# Patient Record
Sex: Female | Born: 1975
Health system: Southern US, Community
[De-identification: ages and names within clinical notes are randomized; demographics above are authoritative.]

## PROBLEM LIST (undated history)

## (undated) ENCOUNTER — Inpatient Hospital Stay: Payer: Self-pay

## (undated) DIAGNOSIS — N200 Calculus of kidney: Secondary | ICD-10-CM

## (undated) DIAGNOSIS — M722 Plantar fascial fibromatosis: Secondary | ICD-10-CM

## (undated) DIAGNOSIS — E669 Obesity, unspecified: Secondary | ICD-10-CM

## (undated) DIAGNOSIS — G43909 Migraine, unspecified, not intractable, without status migrainosus: Secondary | ICD-10-CM

## (undated) DIAGNOSIS — F419 Anxiety disorder, unspecified: Secondary | ICD-10-CM

## (undated) DIAGNOSIS — K219 Gastro-esophageal reflux disease without esophagitis: Secondary | ICD-10-CM

## (undated) DIAGNOSIS — O24419 Gestational diabetes mellitus in pregnancy, unspecified control: Secondary | ICD-10-CM

## (undated) DIAGNOSIS — M5126 Other intervertebral disc displacement, lumbar region: Secondary | ICD-10-CM

## (undated) DIAGNOSIS — M549 Dorsalgia, unspecified: Secondary | ICD-10-CM

## (undated) DIAGNOSIS — E041 Nontoxic single thyroid nodule: Secondary | ICD-10-CM

## (undated) DIAGNOSIS — K859 Acute pancreatitis without necrosis or infection, unspecified: Secondary | ICD-10-CM

## (undated) DIAGNOSIS — M329 Systemic lupus erythematosus, unspecified: Secondary | ICD-10-CM

## (undated) DIAGNOSIS — IMO0002 Reserved for concepts with insufficient information to code with codable children: Secondary | ICD-10-CM

## (undated) HISTORY — DX: Reserved for concepts with insufficient information to code with codable children: IMO0002

## (undated) HISTORY — DX: Plantar fascial fibromatosis: M72.2

## (undated) HISTORY — DX: Anxiety disorder, unspecified: F41.9

## (undated) HISTORY — DX: Calculus of kidney: N20.0

## (undated) HISTORY — DX: Morbid (severe) obesity due to excess calories: E66.01

## (undated) HISTORY — PX: THYROID CYST EXCISION: SHX2511

## (undated) HISTORY — DX: Gastro-esophageal reflux disease without esophagitis: K21.9

## (undated) HISTORY — PX: CHOLECYSTECTOMY: SHX55

## (undated) HISTORY — PX: LAPAROSCOPY: SHX197

## (undated) HISTORY — DX: Acute pancreatitis without necrosis or infection, unspecified: K85.90

## (undated) HISTORY — PX: APPENDECTOMY: SHX54

## (undated) HISTORY — DX: Obesity, unspecified: E66.9

## (undated) HISTORY — DX: Dorsalgia, unspecified: M54.9

## (undated) HISTORY — DX: Other intervertebral disc displacement, lumbar region: M51.26

## (undated) HISTORY — DX: Systemic lupus erythematosus, unspecified: M32.9

## (undated) HISTORY — DX: Nontoxic single thyroid nodule: E04.1

---

## 1997-08-21 ENCOUNTER — Inpatient Hospital Stay (HOSPITAL_COMMUNITY): Admission: EM | Admit: 1997-08-21 | Discharge: 1997-08-22 | Payer: Self-pay | Admitting: Emergency Medicine

## 1997-08-23 ENCOUNTER — Encounter (HOSPITAL_COMMUNITY): Admission: RE | Admit: 1997-08-23 | Discharge: 1997-11-21 | Payer: Self-pay

## 1999-08-12 ENCOUNTER — Emergency Department (HOSPITAL_COMMUNITY): Admission: EM | Admit: 1999-08-12 | Discharge: 1999-08-12 | Payer: Self-pay | Admitting: Emergency Medicine

## 2004-04-13 DIAGNOSIS — O1490 Unspecified pre-eclampsia, unspecified trimester: Secondary | ICD-10-CM

## 2004-05-13 ENCOUNTER — Other Ambulatory Visit: Admission: RE | Admit: 2004-05-13 | Discharge: 2004-05-13 | Payer: Self-pay | Admitting: Obstetrics & Gynecology

## 2004-11-03 ENCOUNTER — Emergency Department (HOSPITAL_COMMUNITY): Admission: EM | Admit: 2004-11-03 | Discharge: 2004-11-03 | Payer: Self-pay | Admitting: Emergency Medicine

## 2004-11-28 ENCOUNTER — Ambulatory Visit (HOSPITAL_COMMUNITY): Admission: RE | Admit: 2004-11-28 | Discharge: 2004-11-28 | Payer: Self-pay | Admitting: Obstetrics & Gynecology

## 2005-01-11 ENCOUNTER — Inpatient Hospital Stay (HOSPITAL_COMMUNITY): Admission: AD | Admit: 2005-01-11 | Discharge: 2005-01-11 | Payer: Self-pay | Admitting: Obstetrics and Gynecology

## 2005-01-16 ENCOUNTER — Inpatient Hospital Stay (HOSPITAL_COMMUNITY): Admission: AD | Admit: 2005-01-16 | Discharge: 2005-01-17 | Payer: Self-pay | Admitting: Obstetrics and Gynecology

## 2005-02-24 ENCOUNTER — Inpatient Hospital Stay (HOSPITAL_COMMUNITY): Admission: AD | Admit: 2005-02-24 | Discharge: 2005-02-25 | Payer: Self-pay | Admitting: Obstetrics & Gynecology

## 2005-04-23 ENCOUNTER — Inpatient Hospital Stay (HOSPITAL_COMMUNITY): Admission: AD | Admit: 2005-04-23 | Discharge: 2005-04-23 | Payer: Self-pay | Admitting: Obstetrics & Gynecology

## 2005-06-14 ENCOUNTER — Inpatient Hospital Stay (HOSPITAL_COMMUNITY): Admission: AD | Admit: 2005-06-14 | Discharge: 2005-06-14 | Payer: Self-pay | Admitting: Obstetrics and Gynecology

## 2005-07-15 ENCOUNTER — Inpatient Hospital Stay (HOSPITAL_COMMUNITY): Admission: AD | Admit: 2005-07-15 | Discharge: 2005-07-15 | Payer: Self-pay | Admitting: Obstetrics & Gynecology

## 2005-07-31 ENCOUNTER — Inpatient Hospital Stay (HOSPITAL_COMMUNITY): Admission: AD | Admit: 2005-07-31 | Discharge: 2005-07-31 | Payer: Self-pay | Admitting: Obstetrics & Gynecology

## 2005-08-11 ENCOUNTER — Inpatient Hospital Stay (HOSPITAL_COMMUNITY): Admission: AD | Admit: 2005-08-11 | Discharge: 2005-08-14 | Payer: Self-pay | Admitting: Obstetrics and Gynecology

## 2005-08-15 ENCOUNTER — Encounter: Admission: RE | Admit: 2005-08-15 | Discharge: 2005-09-14 | Payer: Self-pay | Admitting: Obstetrics and Gynecology

## 2005-08-21 ENCOUNTER — Inpatient Hospital Stay (HOSPITAL_COMMUNITY): Admission: AD | Admit: 2005-08-21 | Discharge: 2005-08-23 | Payer: Self-pay | Admitting: Obstetrics and Gynecology

## 2005-09-15 ENCOUNTER — Encounter: Admission: RE | Admit: 2005-09-15 | Discharge: 2005-10-14 | Payer: Self-pay | Admitting: Obstetrics and Gynecology

## 2005-10-15 ENCOUNTER — Encounter: Admission: RE | Admit: 2005-10-15 | Discharge: 2005-11-14 | Payer: Self-pay | Admitting: Obstetrics and Gynecology

## 2005-11-15 ENCOUNTER — Encounter: Admission: RE | Admit: 2005-11-15 | Discharge: 2005-12-15 | Payer: Self-pay | Admitting: Obstetrics and Gynecology

## 2005-12-16 ENCOUNTER — Encounter: Admission: RE | Admit: 2005-12-16 | Discharge: 2006-01-14 | Payer: Self-pay | Admitting: Obstetrics and Gynecology

## 2006-01-15 ENCOUNTER — Encounter: Admission: RE | Admit: 2006-01-15 | Discharge: 2006-02-14 | Payer: Self-pay | Admitting: Obstetrics and Gynecology

## 2006-02-10 ENCOUNTER — Encounter: Admission: RE | Admit: 2006-02-10 | Discharge: 2006-02-10 | Payer: Self-pay | Admitting: Rheumatology

## 2006-02-15 ENCOUNTER — Encounter: Admission: RE | Admit: 2006-02-15 | Discharge: 2006-03-16 | Payer: Self-pay | Admitting: Obstetrics and Gynecology

## 2006-03-17 ENCOUNTER — Encounter: Admission: RE | Admit: 2006-03-17 | Discharge: 2006-04-16 | Payer: Self-pay | Admitting: Obstetrics and Gynecology

## 2006-03-24 LAB — CONVERTED CEMR LAB: Pap Smear: NORMAL

## 2006-04-17 ENCOUNTER — Encounter: Admission: RE | Admit: 2006-04-17 | Discharge: 2006-05-17 | Payer: Self-pay | Admitting: Obstetrics and Gynecology

## 2006-05-18 ENCOUNTER — Encounter: Admission: RE | Admit: 2006-05-18 | Discharge: 2006-06-15 | Payer: Self-pay | Admitting: Obstetrics and Gynecology

## 2006-06-10 ENCOUNTER — Emergency Department: Payer: Self-pay | Admitting: Emergency Medicine

## 2006-06-16 ENCOUNTER — Encounter: Admission: RE | Admit: 2006-06-16 | Discharge: 2006-07-16 | Payer: Self-pay | Admitting: Obstetrics and Gynecology

## 2007-04-14 DIAGNOSIS — B343 Parvovirus infection, unspecified: Secondary | ICD-10-CM

## 2007-04-14 DIAGNOSIS — O98511 Other viral diseases complicating pregnancy, first trimester: Secondary | ICD-10-CM

## 2007-04-17 ENCOUNTER — Inpatient Hospital Stay (HOSPITAL_COMMUNITY): Admission: AD | Admit: 2007-04-17 | Discharge: 2007-04-17 | Payer: Self-pay | Admitting: Obstetrics & Gynecology

## 2007-04-23 ENCOUNTER — Inpatient Hospital Stay (HOSPITAL_COMMUNITY): Admission: AD | Admit: 2007-04-23 | Discharge: 2007-04-24 | Payer: Self-pay | Admitting: Obstetrics and Gynecology

## 2007-04-23 ENCOUNTER — Encounter: Payer: Self-pay | Admitting: Family Medicine

## 2007-04-29 ENCOUNTER — Telehealth (INDEPENDENT_AMBULATORY_CARE_PROVIDER_SITE_OTHER): Payer: Self-pay | Admitting: *Deleted

## 2007-04-30 ENCOUNTER — Emergency Department (HOSPITAL_COMMUNITY): Admission: EM | Admit: 2007-04-30 | Discharge: 2007-04-30 | Payer: Self-pay | Admitting: Emergency Medicine

## 2007-05-24 ENCOUNTER — Emergency Department (HOSPITAL_COMMUNITY): Admission: EM | Admit: 2007-05-24 | Discharge: 2007-05-24 | Payer: Self-pay | Admitting: Emergency Medicine

## 2007-05-30 ENCOUNTER — Ambulatory Visit: Payer: Self-pay | Admitting: Family Medicine

## 2007-05-30 ENCOUNTER — Encounter (INDEPENDENT_AMBULATORY_CARE_PROVIDER_SITE_OTHER): Payer: Self-pay | Admitting: *Deleted

## 2007-05-30 DIAGNOSIS — R143 Flatulence: Secondary | ICD-10-CM

## 2007-05-30 DIAGNOSIS — E041 Nontoxic single thyroid nodule: Secondary | ICD-10-CM | POA: Insufficient documentation

## 2007-05-30 DIAGNOSIS — M329 Systemic lupus erythematosus, unspecified: Secondary | ICD-10-CM | POA: Insufficient documentation

## 2007-05-30 DIAGNOSIS — K219 Gastro-esophageal reflux disease without esophagitis: Secondary | ICD-10-CM | POA: Insufficient documentation

## 2007-05-30 DIAGNOSIS — M5126 Other intervertebral disc displacement, lumbar region: Secondary | ICD-10-CM | POA: Insufficient documentation

## 2007-05-30 DIAGNOSIS — L930 Discoid lupus erythematosus: Secondary | ICD-10-CM

## 2007-05-30 DIAGNOSIS — Z9189 Other specified personal risk factors, not elsewhere classified: Secondary | ICD-10-CM | POA: Insufficient documentation

## 2007-05-30 DIAGNOSIS — R141 Gas pain: Secondary | ICD-10-CM | POA: Insufficient documentation

## 2007-05-30 DIAGNOSIS — M549 Dorsalgia, unspecified: Secondary | ICD-10-CM | POA: Insufficient documentation

## 2007-05-30 DIAGNOSIS — N808 Other endometriosis: Secondary | ICD-10-CM | POA: Insufficient documentation

## 2007-05-30 DIAGNOSIS — R142 Eructation: Secondary | ICD-10-CM | POA: Insufficient documentation

## 2007-05-30 DIAGNOSIS — M722 Plantar fascial fibromatosis: Secondary | ICD-10-CM | POA: Insufficient documentation

## 2007-05-30 DIAGNOSIS — R109 Unspecified abdominal pain: Secondary | ICD-10-CM | POA: Insufficient documentation

## 2007-06-05 LAB — CONVERTED CEMR LAB: Anti Nuclear Antibody(ANA): NEGATIVE

## 2007-06-06 ENCOUNTER — Telehealth: Payer: Self-pay | Admitting: Family Medicine

## 2007-06-06 ENCOUNTER — Telehealth (INDEPENDENT_AMBULATORY_CARE_PROVIDER_SITE_OTHER): Payer: Self-pay | Admitting: *Deleted

## 2007-06-07 LAB — CONVERTED CEMR LAB
ALT: 59 U/L — ABNORMAL HIGH
AST: 49 U/L — ABNORMAL HIGH
Albumin: 4.1 g/dL
Alkaline Phosphatase: 90 U/L
BUN: 8 mg/dL
Basophils Absolute: 0.1 10*3/uL
Basophils Relative: 0.9 %
Bilirubin, Direct: 0.3 mg/dL
CO2: 26 meq/L
Calcium: 9.7 mg/dL
Chloride: 101 meq/L
Cholesterol: 235 mg/dL
Creatinine, Ser: 0.7 mg/dL
Direct LDL: 132.4 mg/dL
Eosinophils Absolute: 0.2 10*3/uL
Eosinophils Relative: 1.9 %
Folate: 7.3 ng/mL
GFR calc Af Amer: 127 mL/min
GFR calc non Af Amer: 105 mL/min
Glucose, Bld: 82 mg/dL
HCT: 39.5 %
HDL: 38.6 mg/dL — ABNORMAL LOW
Hemoglobin: 13.2 g/dL
Lymphocytes Relative: 22.5 %
MCHC: 33.4 g/dL
MCV: 85.5 fL
Monocytes Absolute: 0.5 10*3/uL
Monocytes Relative: 4.6 %
Neutro Abs: 7 10*3/uL
Neutrophils Relative %: 70.1 %
Platelets: 152 10*3/uL
Potassium: 4.6 meq/L
RBC: 4.62 M/uL
RDW: 12.5 %
Rheumatoid fact SerPl-aCnc: <20 [IU]/mL — ABNORMAL LOW
Sed Rate: 24 mm/h
Sodium: 139 meq/L
TSH: 2.27 u[IU]/mL
Total Bilirubin: 0.9 mg/dL
Total CHOL/HDL Ratio: 6.1
Total Protein: 7.3 g/dL
Triglycerides: 308 mg/dL
VLDL: 62 mg/dL — ABNORMAL HIGH
Vitamin B-12: 237 pg/mL
WBC: 11 10*3/uL — ABNORMAL HIGH

## 2007-06-08 ENCOUNTER — Encounter: Admission: RE | Admit: 2007-06-08 | Discharge: 2007-06-08 | Payer: Self-pay | Admitting: Family Medicine

## 2007-06-09 ENCOUNTER — Encounter (INDEPENDENT_AMBULATORY_CARE_PROVIDER_SITE_OTHER): Payer: Self-pay | Admitting: *Deleted

## 2007-06-09 ENCOUNTER — Telehealth: Payer: Self-pay | Admitting: Family Medicine

## 2007-06-10 ENCOUNTER — Encounter (INDEPENDENT_AMBULATORY_CARE_PROVIDER_SITE_OTHER): Payer: Self-pay | Admitting: *Deleted

## 2007-06-14 ENCOUNTER — Telehealth: Payer: Self-pay | Admitting: Internal Medicine

## 2007-06-15 ENCOUNTER — Telehealth (INDEPENDENT_AMBULATORY_CARE_PROVIDER_SITE_OTHER): Payer: Self-pay | Admitting: *Deleted

## 2007-06-16 ENCOUNTER — Encounter (INDEPENDENT_AMBULATORY_CARE_PROVIDER_SITE_OTHER): Payer: Self-pay | Admitting: *Deleted

## 2007-06-20 ENCOUNTER — Telehealth: Payer: Self-pay | Admitting: Family Medicine

## 2007-06-21 ENCOUNTER — Ambulatory Visit: Payer: Self-pay | Admitting: Family Medicine

## 2007-06-22 LAB — CONVERTED CEMR LAB
ALT: 74 U/L — ABNORMAL HIGH
AST: 58 U/L — ABNORMAL HIGH
Albumin: 3.8 g/dL
Alkaline Phosphatase: 86 U/L
Bilirubin, Direct: 0.1 mg/dL
GGT: 151 U/L — ABNORMAL HIGH
Total Bilirubin: 0.6 mg/dL
Total Protein: 6.9 g/dL

## 2007-06-24 LAB — CONVERTED CEMR LAB
HCV Ab: NEGATIVE
Hep A IgM: NEGATIVE
Hep B C IgM: NEGATIVE
Hepatitis B Surface Ag: NEGATIVE

## 2007-06-27 ENCOUNTER — Telehealth (INDEPENDENT_AMBULATORY_CARE_PROVIDER_SITE_OTHER): Payer: Self-pay | Admitting: *Deleted

## 2007-07-04 ENCOUNTER — Ambulatory Visit: Payer: Self-pay | Admitting: Internal Medicine

## 2007-07-06 ENCOUNTER — Telehealth: Payer: Self-pay | Admitting: Family Medicine

## 2007-07-06 ENCOUNTER — Encounter: Payer: Self-pay | Admitting: Family Medicine

## 2007-07-11 ENCOUNTER — Ambulatory Visit (HOSPITAL_COMMUNITY): Admission: RE | Admit: 2007-07-11 | Discharge: 2007-07-11 | Payer: Self-pay | Admitting: Internal Medicine

## 2007-07-13 ENCOUNTER — Ambulatory Visit: Payer: Self-pay | Admitting: Pulmonary Disease

## 2007-07-13 DIAGNOSIS — G4710 Hypersomnia, unspecified: Secondary | ICD-10-CM | POA: Insufficient documentation

## 2007-07-31 ENCOUNTER — Encounter: Payer: Self-pay | Admitting: Pulmonary Disease

## 2007-07-31 ENCOUNTER — Ambulatory Visit (HOSPITAL_BASED_OUTPATIENT_CLINIC_OR_DEPARTMENT_OTHER): Admission: RE | Admit: 2007-07-31 | Discharge: 2007-07-31 | Payer: Self-pay | Admitting: Pulmonary Disease

## 2007-08-02 ENCOUNTER — Ambulatory Visit: Payer: Self-pay | Admitting: Internal Medicine

## 2007-08-02 ENCOUNTER — Encounter: Payer: Self-pay | Admitting: Internal Medicine

## 2007-08-02 ENCOUNTER — Encounter: Payer: Self-pay | Admitting: Family Medicine

## 2007-08-15 ENCOUNTER — Telehealth: Payer: Self-pay | Admitting: Internal Medicine

## 2007-08-16 ENCOUNTER — Encounter (INDEPENDENT_AMBULATORY_CARE_PROVIDER_SITE_OTHER): Payer: Self-pay | Admitting: *Deleted

## 2007-08-18 ENCOUNTER — Emergency Department (HOSPITAL_COMMUNITY): Admission: EM | Admit: 2007-08-18 | Discharge: 2007-08-19 | Payer: Self-pay | Admitting: Emergency Medicine

## 2007-08-25 ENCOUNTER — Encounter: Admission: RE | Admit: 2007-08-25 | Discharge: 2007-08-25 | Payer: Self-pay | Admitting: General Surgery

## 2007-08-26 ENCOUNTER — Emergency Department (HOSPITAL_COMMUNITY): Admission: EM | Admit: 2007-08-26 | Discharge: 2007-08-27 | Payer: Self-pay | Admitting: Emergency Medicine

## 2007-08-29 ENCOUNTER — Telehealth (INDEPENDENT_AMBULATORY_CARE_PROVIDER_SITE_OTHER): Payer: Self-pay | Admitting: *Deleted

## 2007-09-01 ENCOUNTER — Ambulatory Visit: Payer: Self-pay | Admitting: Pulmonary Disease

## 2007-09-02 ENCOUNTER — Encounter (INDEPENDENT_AMBULATORY_CARE_PROVIDER_SITE_OTHER): Payer: Self-pay | Admitting: General Surgery

## 2007-09-02 ENCOUNTER — Ambulatory Visit (HOSPITAL_COMMUNITY): Admission: RE | Admit: 2007-09-02 | Discharge: 2007-09-02 | Payer: Self-pay | Admitting: General Surgery

## 2007-09-21 ENCOUNTER — Telehealth (INDEPENDENT_AMBULATORY_CARE_PROVIDER_SITE_OTHER): Payer: Self-pay | Admitting: *Deleted

## 2007-10-03 ENCOUNTER — Ambulatory Visit: Payer: Self-pay | Admitting: Pulmonary Disease

## 2007-10-03 DIAGNOSIS — G4733 Obstructive sleep apnea (adult) (pediatric): Secondary | ICD-10-CM

## 2007-10-13 ENCOUNTER — Telehealth: Payer: Self-pay | Admitting: Family Medicine

## 2007-10-20 ENCOUNTER — Telehealth (INDEPENDENT_AMBULATORY_CARE_PROVIDER_SITE_OTHER): Payer: Self-pay | Admitting: *Deleted

## 2007-10-24 ENCOUNTER — Telehealth (INDEPENDENT_AMBULATORY_CARE_PROVIDER_SITE_OTHER): Payer: Self-pay | Admitting: *Deleted

## 2007-11-09 ENCOUNTER — Ambulatory Visit: Payer: Self-pay | Admitting: Family Medicine

## 2007-11-10 ENCOUNTER — Telehealth (INDEPENDENT_AMBULATORY_CARE_PROVIDER_SITE_OTHER): Payer: Self-pay | Admitting: *Deleted

## 2007-11-10 LAB — CONVERTED CEMR LAB
C3 Complement: 215 mg/dL — ABNORMAL HIGH
Complement C4, Body Fluid: 57 mg/dL — ABNORMAL HIGH
ENA SM Ab Ser-aCnc: <0.2
Free T4: 0.8 ng/dL
Scleroderma (Scl-70) (ENA) Antibody, IgG: <0.2
T3, Free: 2.9 pg/mL
TSH: 2.06 u[IU]/mL
ds DNA Ab: <1

## 2007-11-14 ENCOUNTER — Ambulatory Visit: Payer: Self-pay | Admitting: Family Medicine

## 2007-11-14 ENCOUNTER — Telehealth (INDEPENDENT_AMBULATORY_CARE_PROVIDER_SITE_OTHER): Payer: Self-pay | Admitting: *Deleted

## 2007-11-14 LAB — CONVERTED CEMR LAB
Sed Rate: 29 mm/h — ABNORMAL HIGH
Total CK: 74 U/L

## 2007-11-29 ENCOUNTER — Ambulatory Visit: Payer: Self-pay | Admitting: Internal Medicine

## 2007-11-29 DIAGNOSIS — L738 Other specified follicular disorders: Secondary | ICD-10-CM | POA: Insufficient documentation

## 2007-12-02 ENCOUNTER — Telehealth (INDEPENDENT_AMBULATORY_CARE_PROVIDER_SITE_OTHER): Payer: Self-pay | Admitting: *Deleted

## 2007-12-11 ENCOUNTER — Emergency Department (HOSPITAL_COMMUNITY): Admission: EM | Admit: 2007-12-11 | Discharge: 2007-12-11 | Payer: Self-pay | Admitting: Emergency Medicine

## 2007-12-13 ENCOUNTER — Ambulatory Visit: Payer: Self-pay | Admitting: Family Medicine

## 2007-12-13 ENCOUNTER — Ambulatory Visit (HOSPITAL_COMMUNITY): Admission: RE | Admit: 2007-12-13 | Discharge: 2007-12-13 | Payer: Self-pay | Admitting: Family Medicine

## 2007-12-13 DIAGNOSIS — M542 Cervicalgia: Secondary | ICD-10-CM | POA: Insufficient documentation

## 2007-12-13 LAB — CONVERTED CEMR LAB
Glucose, Bld: 133 mg/dL
Hemoglobin: 11.1 g/dL

## 2007-12-14 ENCOUNTER — Encounter (INDEPENDENT_AMBULATORY_CARE_PROVIDER_SITE_OTHER): Payer: Self-pay | Admitting: *Deleted

## 2007-12-14 DIAGNOSIS — R209 Unspecified disturbances of skin sensation: Secondary | ICD-10-CM | POA: Insufficient documentation

## 2007-12-15 ENCOUNTER — Emergency Department (HOSPITAL_BASED_OUTPATIENT_CLINIC_OR_DEPARTMENT_OTHER): Admission: EM | Admit: 2007-12-15 | Discharge: 2007-12-15 | Payer: Self-pay | Admitting: Emergency Medicine

## 2007-12-15 ENCOUNTER — Telehealth: Payer: Self-pay | Admitting: Family Medicine

## 2007-12-16 ENCOUNTER — Telehealth: Payer: Self-pay | Admitting: Family Medicine

## 2008-01-09 ENCOUNTER — Telehealth: Payer: Self-pay | Admitting: Family Medicine

## 2008-01-11 ENCOUNTER — Encounter: Admission: RE | Admit: 2008-01-11 | Discharge: 2008-01-11 | Payer: Self-pay | Admitting: Family Medicine

## 2008-01-12 ENCOUNTER — Telehealth (INDEPENDENT_AMBULATORY_CARE_PROVIDER_SITE_OTHER): Payer: Self-pay | Admitting: *Deleted

## 2008-01-12 DIAGNOSIS — E041 Nontoxic single thyroid nodule: Secondary | ICD-10-CM | POA: Insufficient documentation

## 2008-01-16 ENCOUNTER — Encounter (INDEPENDENT_AMBULATORY_CARE_PROVIDER_SITE_OTHER): Payer: Self-pay | Admitting: *Deleted

## 2008-01-24 ENCOUNTER — Telehealth (INDEPENDENT_AMBULATORY_CARE_PROVIDER_SITE_OTHER): Payer: Self-pay | Admitting: *Deleted

## 2008-02-01 ENCOUNTER — Telehealth (INDEPENDENT_AMBULATORY_CARE_PROVIDER_SITE_OTHER): Payer: Self-pay | Admitting: *Deleted

## 2008-02-01 ENCOUNTER — Telehealth: Payer: Self-pay | Admitting: Family Medicine

## 2008-02-09 ENCOUNTER — Ambulatory Visit: Payer: Self-pay | Admitting: Family Medicine

## 2008-02-14 ENCOUNTER — Telehealth: Payer: Self-pay | Admitting: Internal Medicine

## 2008-02-14 ENCOUNTER — Encounter: Payer: Self-pay | Admitting: Family Medicine

## 2008-02-15 ENCOUNTER — Telehealth (INDEPENDENT_AMBULATORY_CARE_PROVIDER_SITE_OTHER): Payer: Self-pay | Admitting: *Deleted

## 2008-02-16 ENCOUNTER — Ambulatory Visit: Payer: Self-pay | Admitting: Family Medicine

## 2008-02-16 DIAGNOSIS — F411 Generalized anxiety disorder: Secondary | ICD-10-CM

## 2008-02-21 ENCOUNTER — Ambulatory Visit: Payer: Self-pay | Admitting: Family Medicine

## 2008-02-21 DIAGNOSIS — H6690 Otitis media, unspecified, unspecified ear: Secondary | ICD-10-CM | POA: Insufficient documentation

## 2008-02-28 ENCOUNTER — Telehealth: Payer: Self-pay | Admitting: Family Medicine

## 2008-03-02 ENCOUNTER — Encounter (INDEPENDENT_AMBULATORY_CARE_PROVIDER_SITE_OTHER): Payer: Self-pay | Admitting: *Deleted

## 2008-03-02 ENCOUNTER — Telehealth: Payer: Self-pay | Admitting: Family Medicine

## 2008-03-23 ENCOUNTER — Telehealth (INDEPENDENT_AMBULATORY_CARE_PROVIDER_SITE_OTHER): Payer: Self-pay | Admitting: *Deleted

## 2008-04-02 ENCOUNTER — Telehealth (INDEPENDENT_AMBULATORY_CARE_PROVIDER_SITE_OTHER): Payer: Self-pay | Admitting: *Deleted

## 2008-04-03 ENCOUNTER — Emergency Department: Payer: Self-pay | Admitting: Emergency Medicine

## 2008-04-16 ENCOUNTER — Telehealth: Payer: Self-pay | Admitting: Family Medicine

## 2008-04-17 DIAGNOSIS — Z87442 Personal history of urinary calculi: Secondary | ICD-10-CM | POA: Insufficient documentation

## 2008-04-17 DIAGNOSIS — Z8719 Personal history of other diseases of the digestive system: Secondary | ICD-10-CM

## 2008-05-01 ENCOUNTER — Telehealth (INDEPENDENT_AMBULATORY_CARE_PROVIDER_SITE_OTHER): Payer: Self-pay | Admitting: *Deleted

## 2008-05-03 ENCOUNTER — Telehealth (INDEPENDENT_AMBULATORY_CARE_PROVIDER_SITE_OTHER): Payer: Self-pay | Admitting: *Deleted

## 2008-05-03 IMAGING — US US TRANSVAGINAL NON-OB
1 series · 14 of 25 positions shown · non-contrast
Comparison: none

CLINICAL DATA: Upper and lower abdominal pain and bloating.
 TRANSABDOMINAL AND TRANSVAGINAL PELVIC ULTRASOUND:
TECHNIQUE: Both transabdominal and transvaginal ultrasound examinations of the pelvis were performed, including evaluation of the uterus, ovaries, adnexal regions, and pelvic cul-de-sac.
 The uterus is retroflexed measuring 5.8 cm sagittally with a depth of 3.5 cm and width of 4.4 cm.  The endometrium is normal measuring 8.4 mm.  Both ovaries are normal in size with small follicles present.  No free fluid is seen.

[Series 1: us transvaginal non-ob · 0.33mm/px · 14 of 56 slices shown]
[im 1/56]
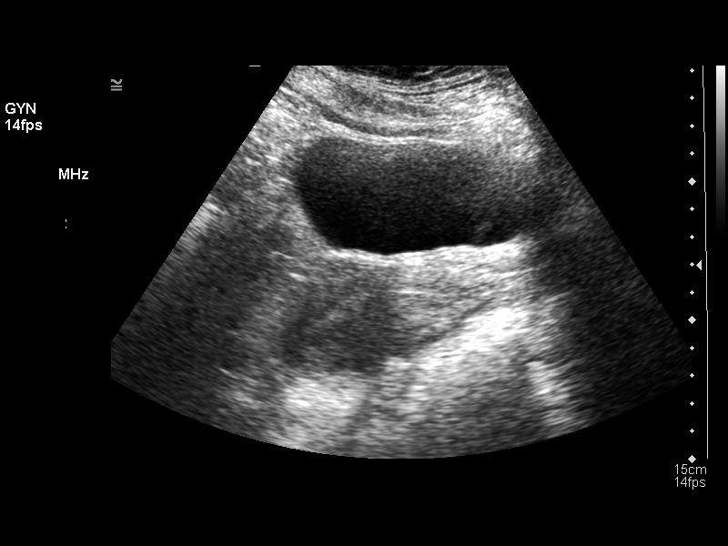
[im 5/56]
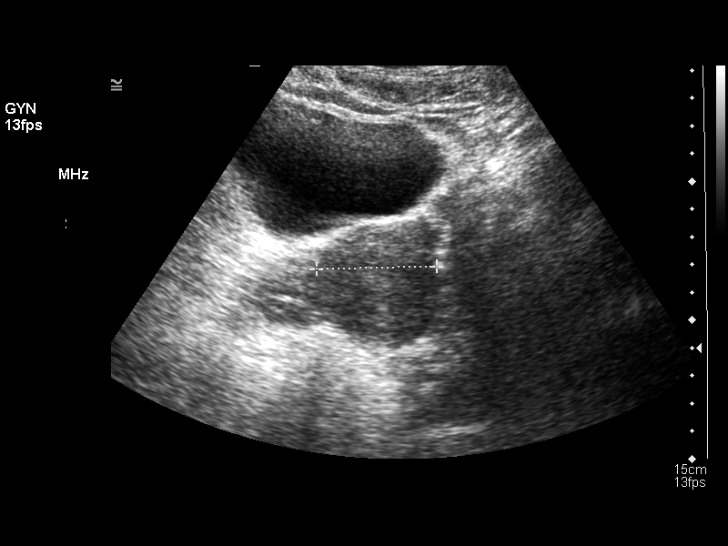
[im 10/56]
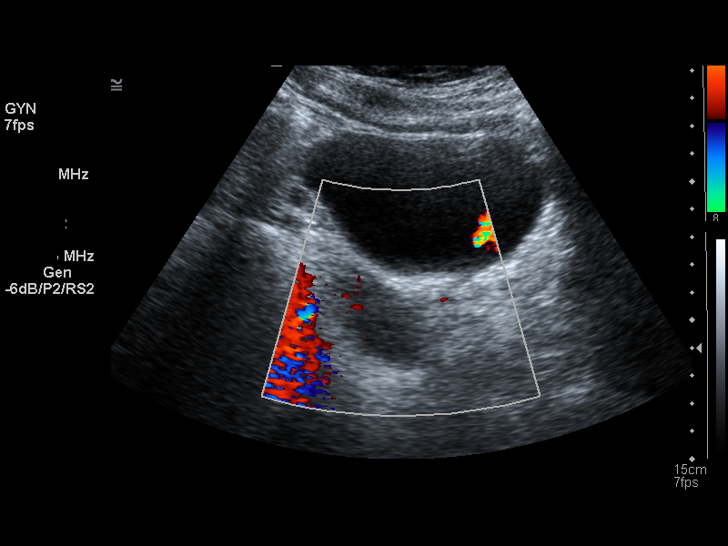
[im 14/56]
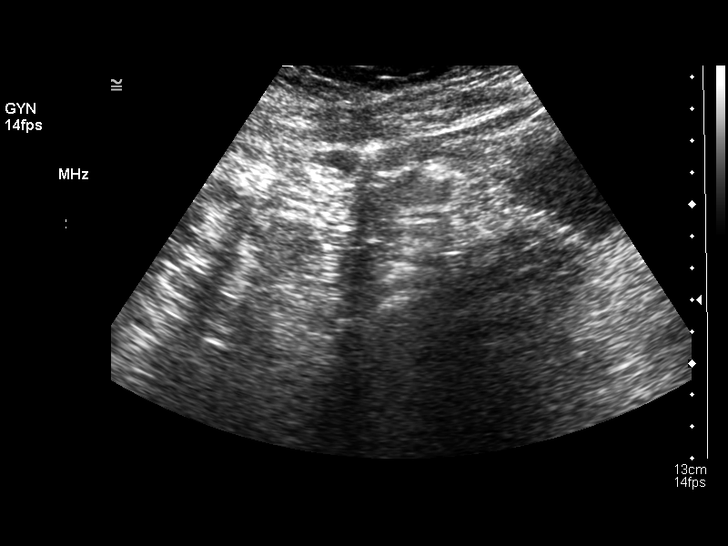
[im 19/56]
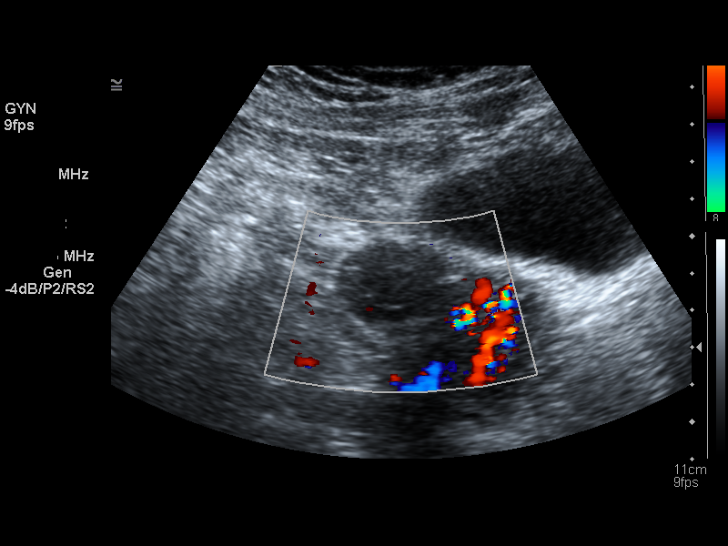
[im 21/56]
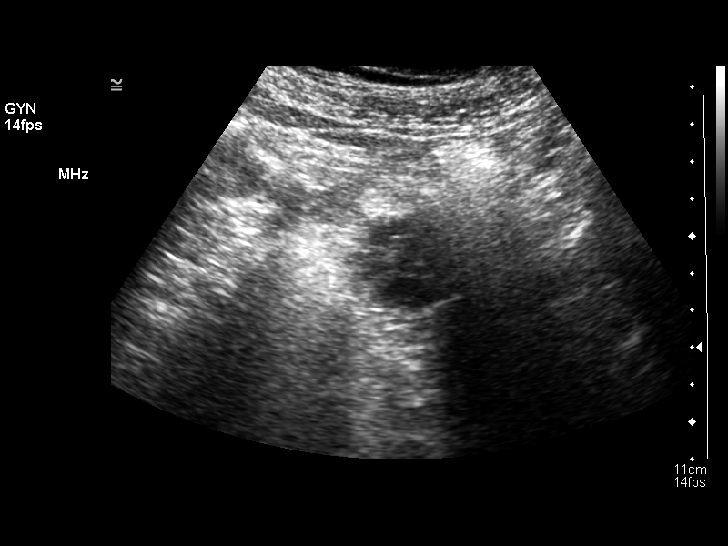
[im 26/56]
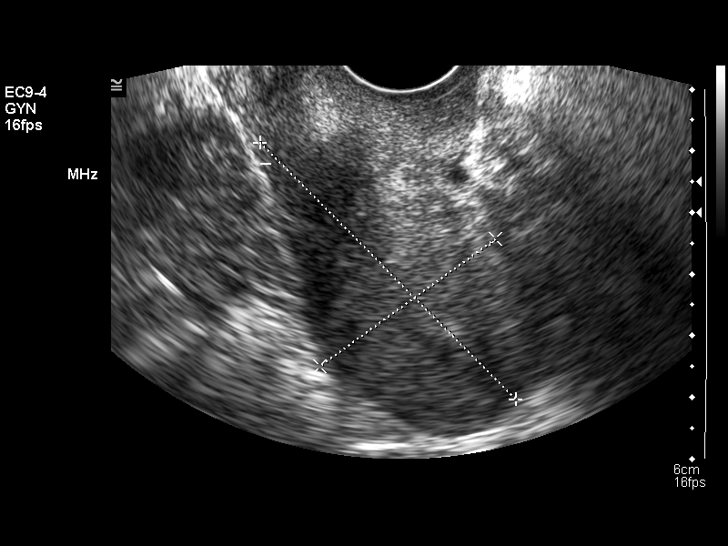
[im 30/56]
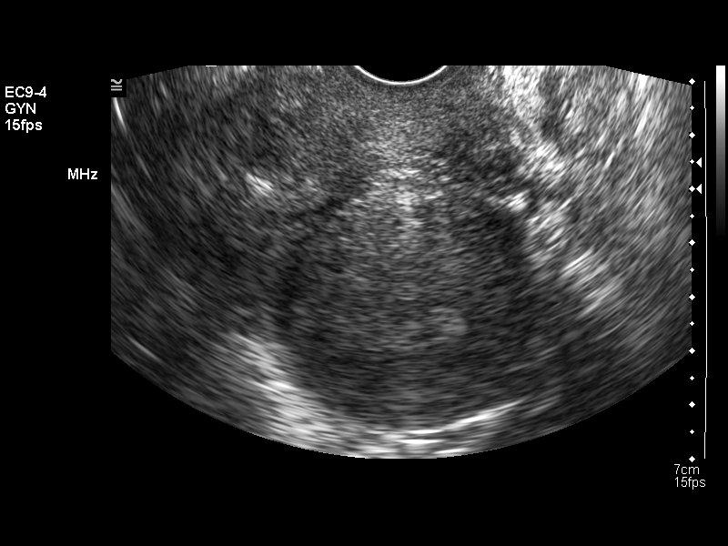
[im 35/56]
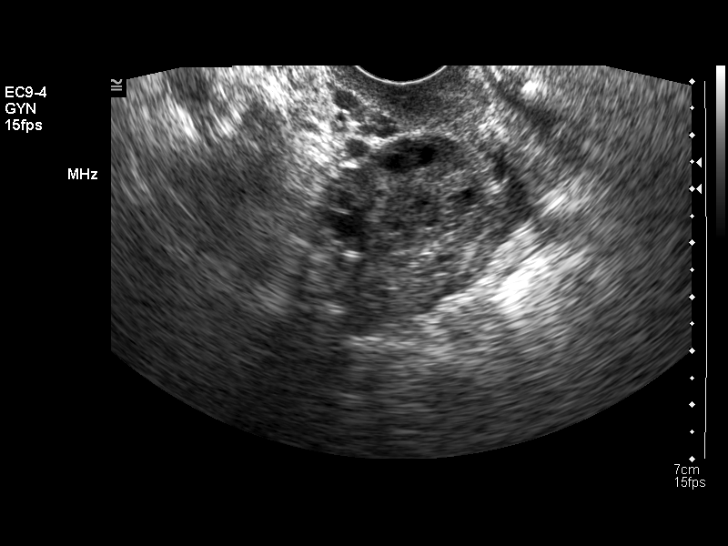
[im 37/56]
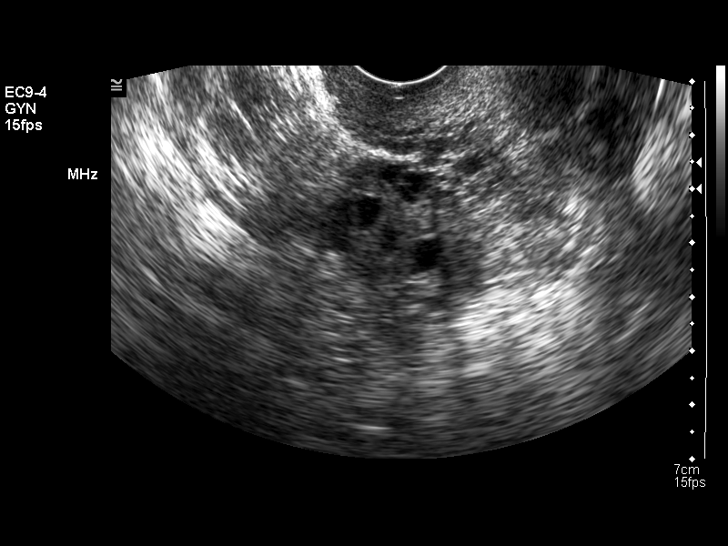
[im 42/56]
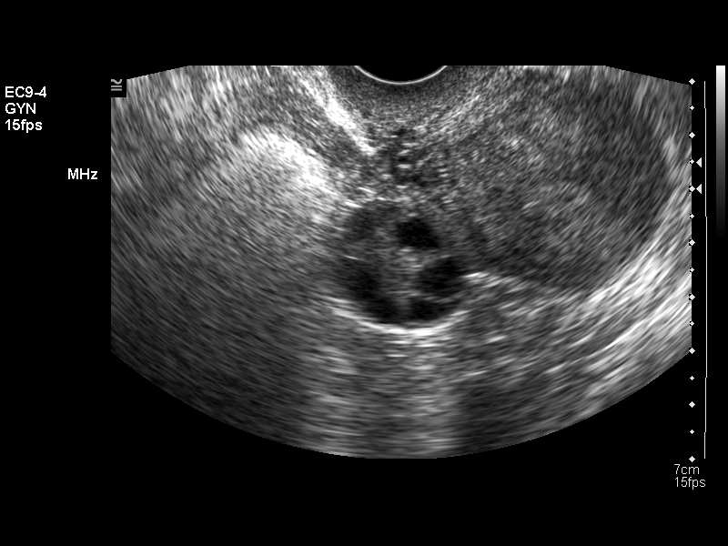
[im 46/56]
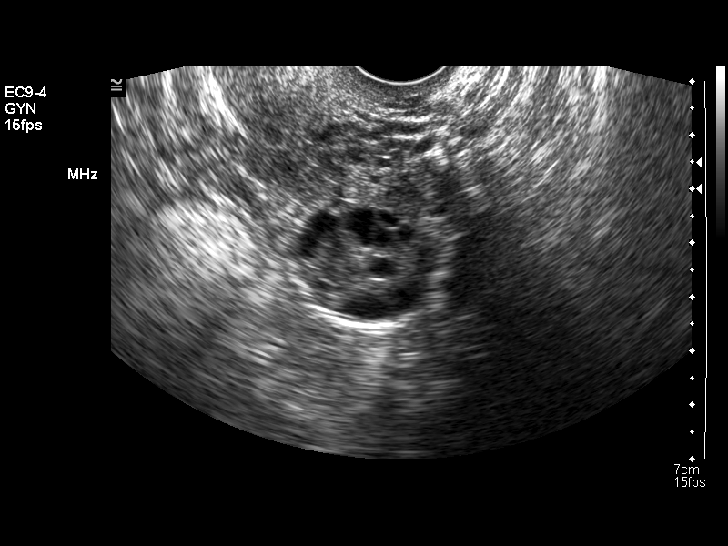
[im 51/56]
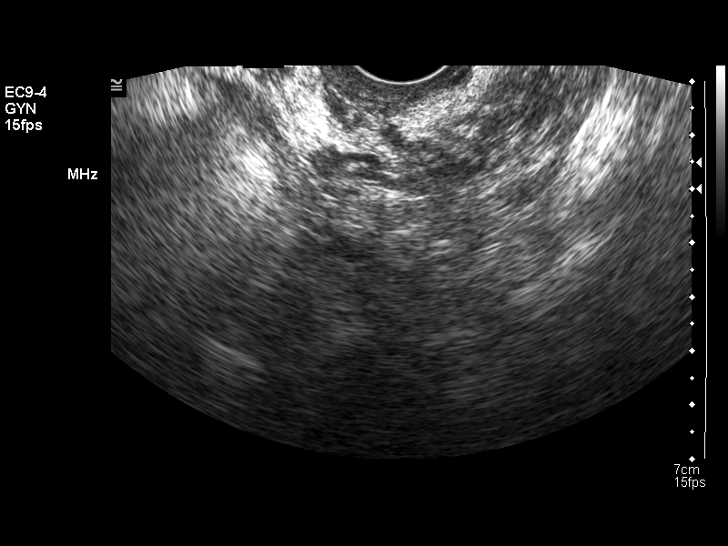
[im 56/56]
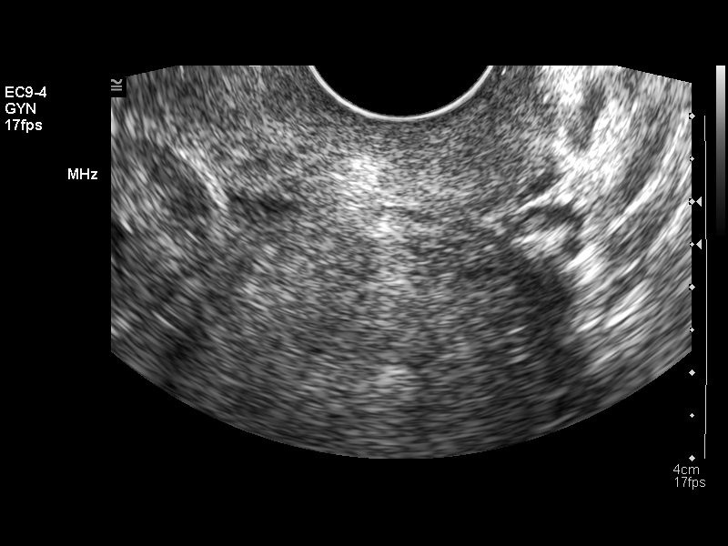

[14 of 25 positions shown; findings below may reference images not displayed]

IMPRESSION: Negative pelvic ultrasound.

## 2008-05-03 IMAGING — US US ABDOMEN COMPLETE
1 series · 14 of 25 positions shown · non-contrast
Comparison: none

CLINICAL DATA: Upper and lower abdominal discomfort and bloating.
 ABDOMEN ULTRASOUND:
TECHNIQUE: Complete abdominal ultrasound examination was performed including evaluation of the liver, gallbladder, bile ducts, pancreas, kidneys, spleen, IVC, and abdominal aorta.
 The gallbladder is well visualized and no gallstones are noted.  The liver is diffusely echogenic consistent with fatty infiltration.  No focal abnormality is seen.  The common bile duct is normal measuring 3.1 mm in diameter.  The IVC, pancreas, and spleen appear normal.  No hydronephrosis is noted.  The right kidney measures 10.6 cm sagittally with the left kidney measuring 10.4 cm.  The abdominal aorta is normal in caliber.

[Series 1: us abdomen complete · 0.47mm/px · 14 of 59 slices shown]
[im 1/59]
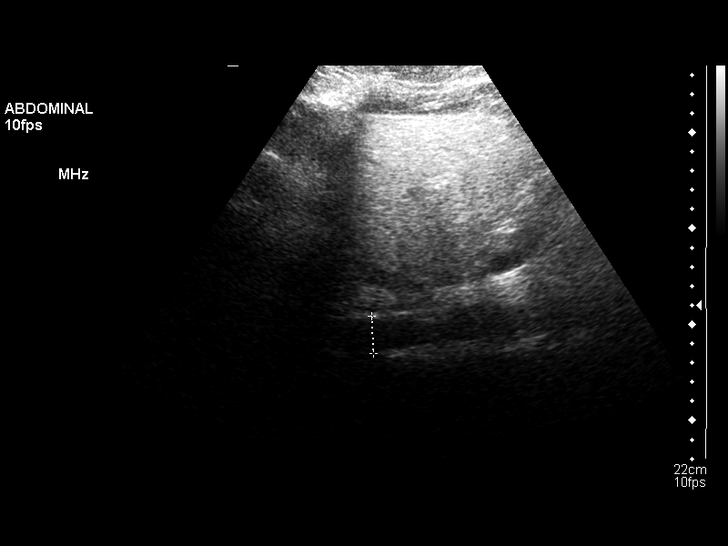
[im 5/59]
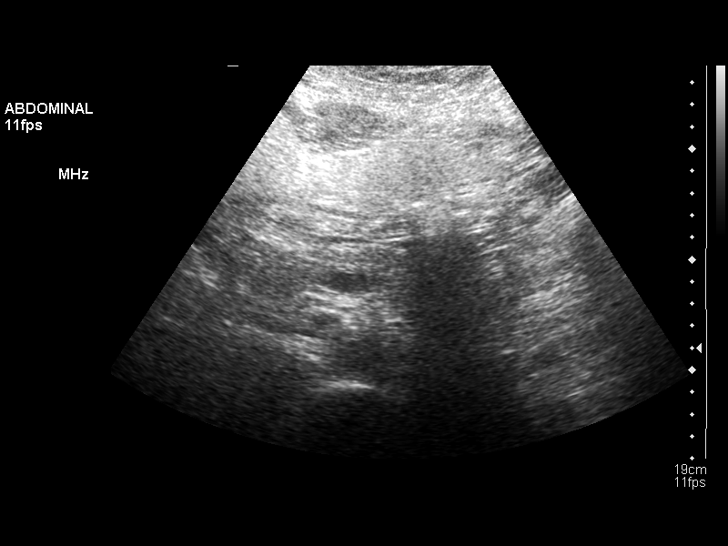
[im 10/59]
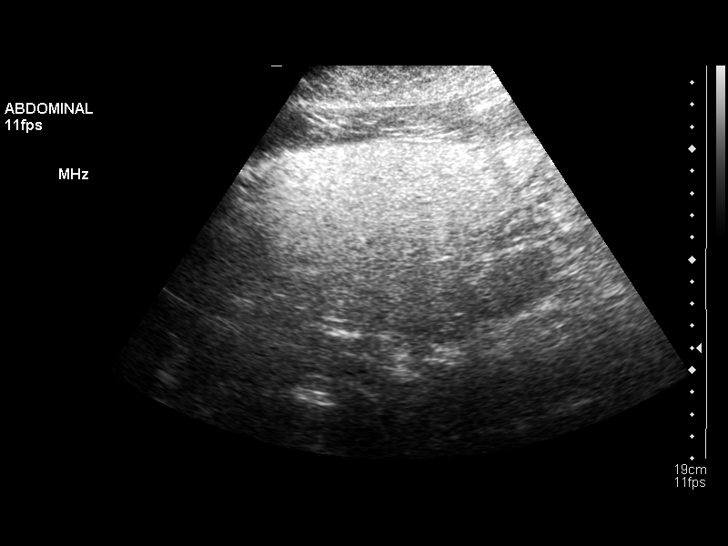
[im 15/59]
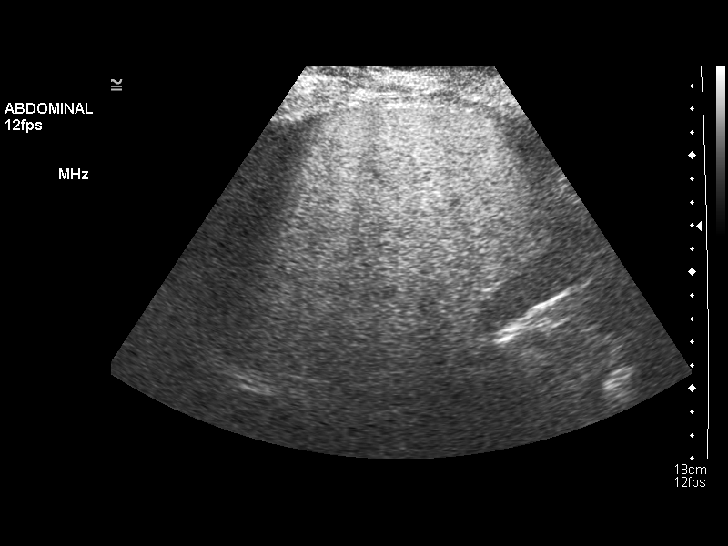
[im 20/59]
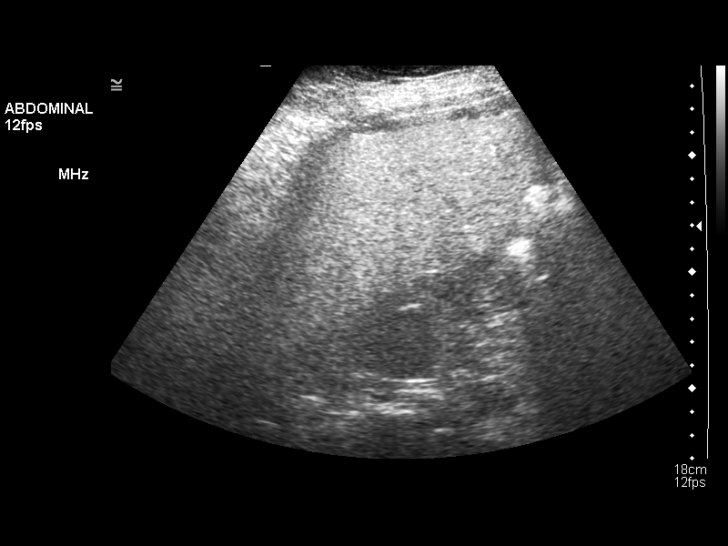
[im 22/59]
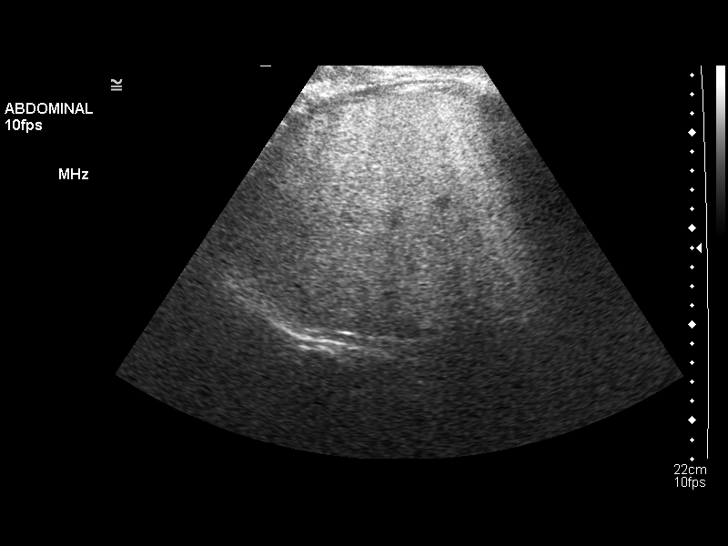
[im 27/59]
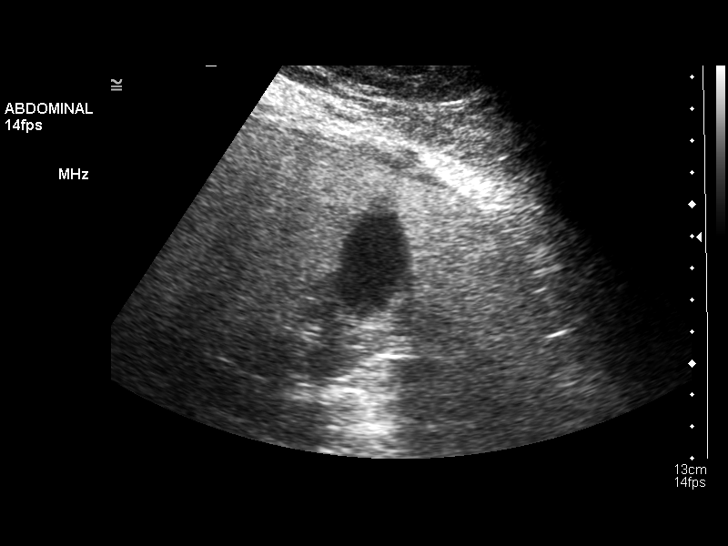
[im 32/59]
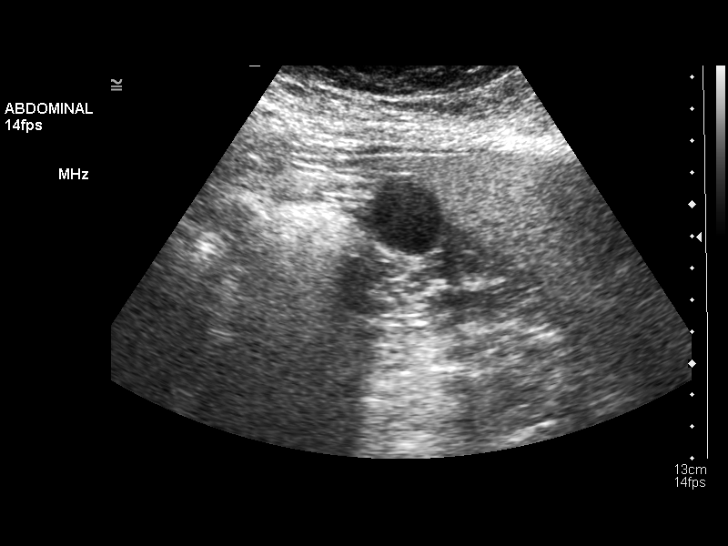
[im 37/59]
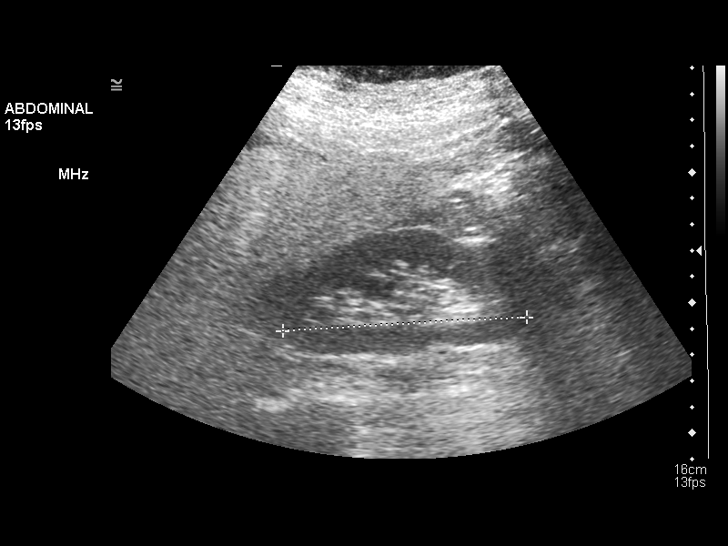
[im 39/59]
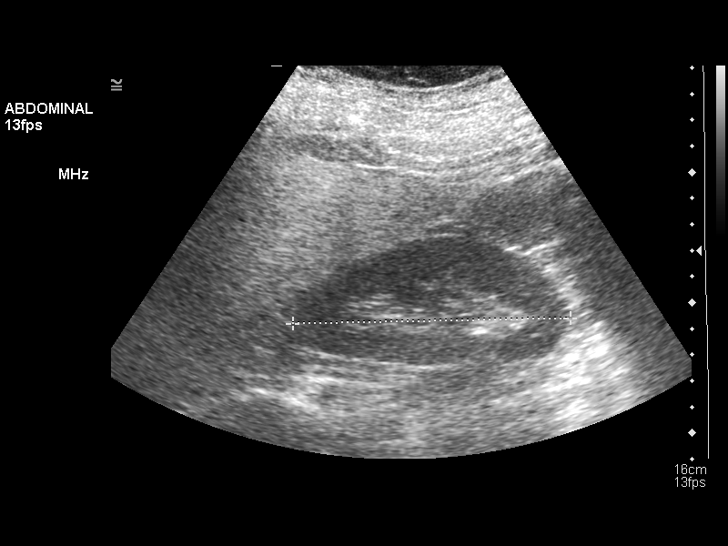
[im 44/59]
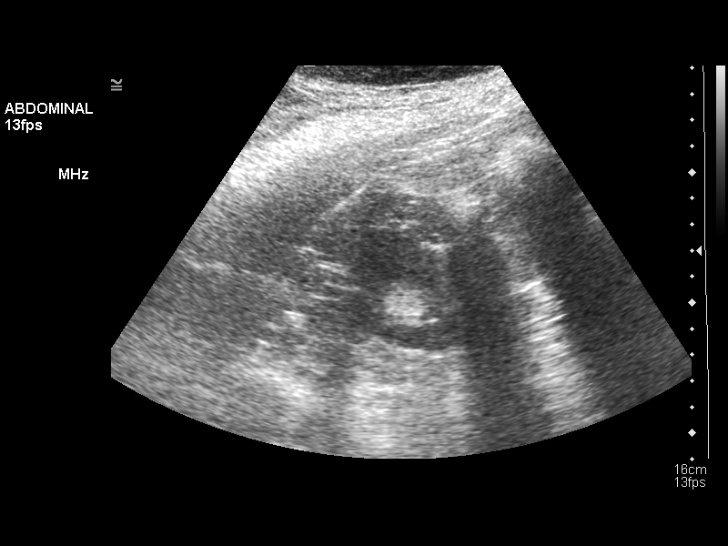
[im 49/59]
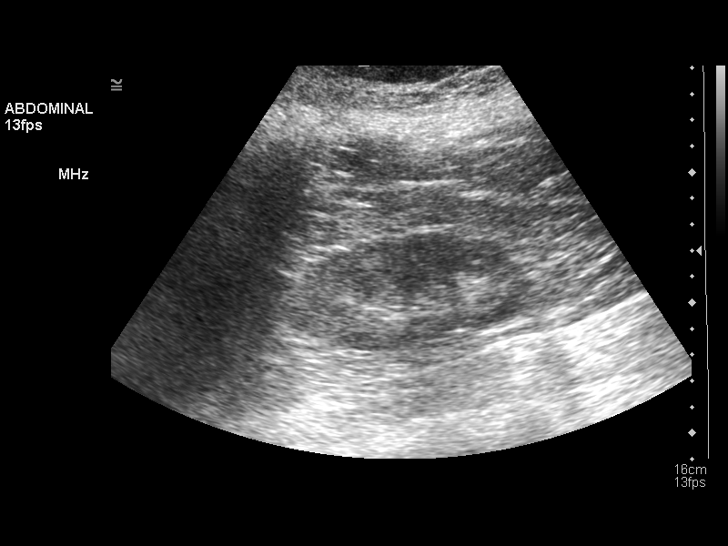
[im 54/59]
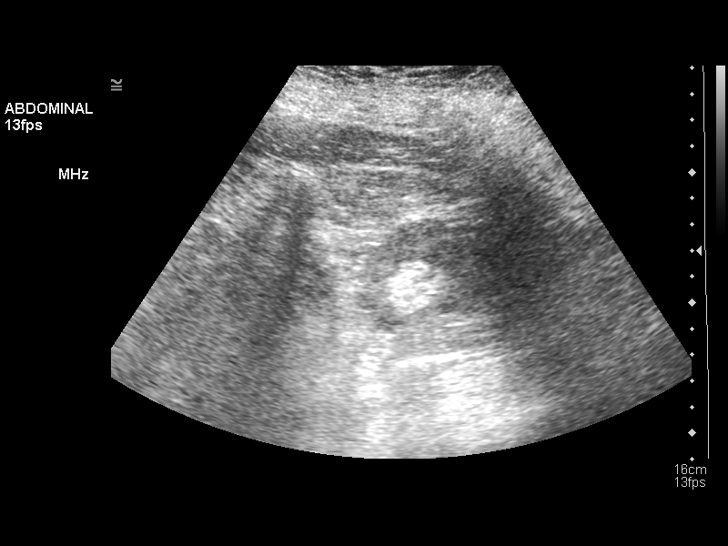
[im 59/59]
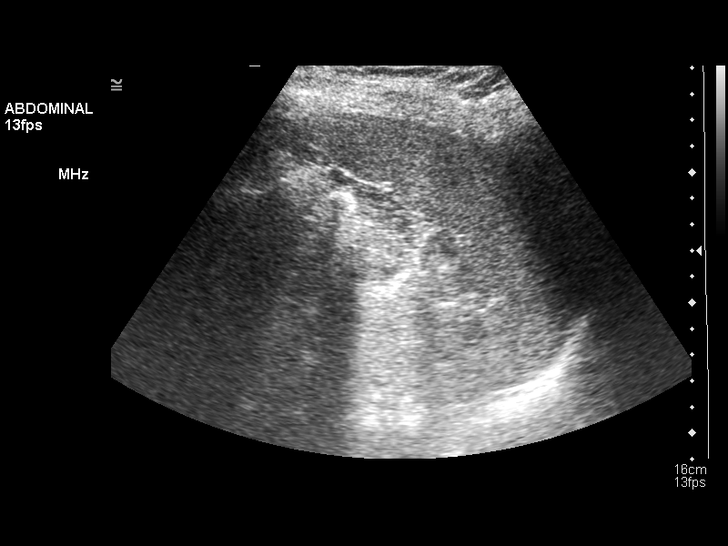

[14 of 25 positions shown; findings below may reference images not displayed]

IMPRESSION: 1.  No gallstones.
 2.  Fatty infiltration of the liver.

## 2008-05-08 ENCOUNTER — Ambulatory Visit: Payer: Self-pay | Admitting: Family Medicine

## 2008-05-08 DIAGNOSIS — J019 Acute sinusitis, unspecified: Secondary | ICD-10-CM | POA: Insufficient documentation

## 2008-05-08 LAB — CONVERTED CEMR LAB: Rapid Strep: NEGATIVE

## 2008-05-21 ENCOUNTER — Telehealth: Payer: Self-pay | Admitting: Family Medicine

## 2008-05-31 ENCOUNTER — Telehealth (INDEPENDENT_AMBULATORY_CARE_PROVIDER_SITE_OTHER): Payer: Self-pay | Admitting: *Deleted

## 2008-06-05 IMAGING — NM NM HEPATO W/GB/PHARM/[PERSON_NAME]
2 series · 12 of 12 positions shown · non-contrast
Comparison: None

CLINICAL DATA: Abdominal pain

NUCLEAR MEDICINE HEPATOBILIARY IMAGING WITH/WITHOUT GALLBLADDER
EJECTION FRACTION.
TECHNIQUE: Sequential abdominal images were obtained following
intravenous injection of radiopharmaceutical.  Sequential images
were continued following
Radiopharmaceutical: 5.3 mCi technetium-77m - Choletec IV.

[hepato · 2.40mm/px · 6 of 60 frames shown (1 of 2)]
[frame 6/60]
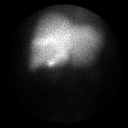
[frame 16/60]
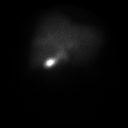
[frame 26/60]
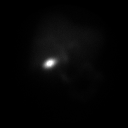
[frame 36/60]
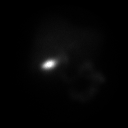
[frame 46/60]
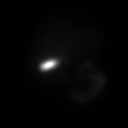
[frame 56/60]
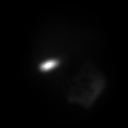

[hepato · 2.40mm/px · 6 of 60 frames shown (2 of 2)]
[frame 6/60]
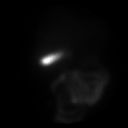
[frame 16/60]
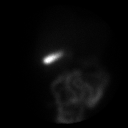
[frame 26/60]
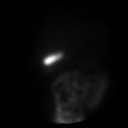
[frame 36/60]
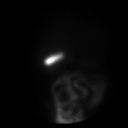
[frame 46/60]
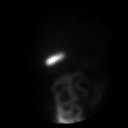
[frame 56/60]
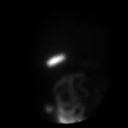

[12 of 12 positions shown; findings below may reference images not displayed]

FINDINGS: There is prompt and uniform uptake in the liver.
Gallbladder is visualized at 10 minutes, with bowel activity at 30
minutes.  Gallbladder ejection fraction is 46% (normal is greater
than 50%).
IMPRESSION: 1.  Slightly decreased gallbladder ejection fraction.

## 2008-06-21 ENCOUNTER — Telehealth: Payer: Self-pay | Admitting: Family Medicine

## 2008-06-27 ENCOUNTER — Encounter: Admission: RE | Admit: 2008-06-27 | Discharge: 2008-06-27 | Payer: Self-pay | Admitting: Endocrinology

## 2008-07-10 ENCOUNTER — Telehealth: Payer: Self-pay | Admitting: Family Medicine

## 2008-07-27 ENCOUNTER — Telehealth (INDEPENDENT_AMBULATORY_CARE_PROVIDER_SITE_OTHER): Payer: Self-pay | Admitting: *Deleted

## 2008-07-31 ENCOUNTER — Telehealth (INDEPENDENT_AMBULATORY_CARE_PROVIDER_SITE_OTHER): Payer: Self-pay | Admitting: *Deleted

## 2008-08-01 ENCOUNTER — Telehealth (INDEPENDENT_AMBULATORY_CARE_PROVIDER_SITE_OTHER): Payer: Self-pay | Admitting: *Deleted

## 2008-08-07 LAB — CONVERTED CEMR LAB: Pap Smear: NORMAL

## 2008-08-23 ENCOUNTER — Telehealth (INDEPENDENT_AMBULATORY_CARE_PROVIDER_SITE_OTHER): Payer: Self-pay | Admitting: *Deleted

## 2008-08-31 ENCOUNTER — Telehealth (INDEPENDENT_AMBULATORY_CARE_PROVIDER_SITE_OTHER): Payer: Self-pay | Admitting: *Deleted

## 2008-09-21 ENCOUNTER — Ambulatory Visit: Payer: Self-pay | Admitting: Family Medicine

## 2008-09-21 DIAGNOSIS — S93609A Unspecified sprain of unspecified foot, initial encounter: Secondary | ICD-10-CM

## 2008-09-21 DIAGNOSIS — G4700 Insomnia, unspecified: Secondary | ICD-10-CM | POA: Insufficient documentation

## 2008-10-11 ENCOUNTER — Ambulatory Visit: Payer: Self-pay | Admitting: Family Medicine

## 2008-10-18 ENCOUNTER — Telehealth (INDEPENDENT_AMBULATORY_CARE_PROVIDER_SITE_OTHER): Payer: Self-pay | Admitting: *Deleted

## 2008-10-21 ENCOUNTER — Emergency Department (HOSPITAL_COMMUNITY): Admission: EM | Admit: 2008-10-21 | Discharge: 2008-10-21 | Payer: Self-pay | Admitting: Family Medicine

## 2008-10-21 ENCOUNTER — Telehealth: Payer: Self-pay | Admitting: Family Medicine

## 2008-11-12 ENCOUNTER — Telehealth (INDEPENDENT_AMBULATORY_CARE_PROVIDER_SITE_OTHER): Payer: Self-pay | Admitting: *Deleted

## 2008-11-16 ENCOUNTER — Telehealth (INDEPENDENT_AMBULATORY_CARE_PROVIDER_SITE_OTHER): Payer: Self-pay | Admitting: *Deleted

## 2008-12-14 ENCOUNTER — Ambulatory Visit: Payer: Self-pay | Admitting: Family Medicine

## 2008-12-14 DIAGNOSIS — IMO0001 Reserved for inherently not codable concepts without codable children: Secondary | ICD-10-CM | POA: Insufficient documentation

## 2008-12-19 ENCOUNTER — Telehealth (INDEPENDENT_AMBULATORY_CARE_PROVIDER_SITE_OTHER): Payer: Self-pay | Admitting: *Deleted

## 2008-12-21 ENCOUNTER — Telehealth (INDEPENDENT_AMBULATORY_CARE_PROVIDER_SITE_OTHER): Payer: Self-pay | Admitting: *Deleted

## 2008-12-24 ENCOUNTER — Telehealth: Payer: Self-pay | Admitting: Family Medicine

## 2008-12-24 ENCOUNTER — Telehealth: Payer: Self-pay | Admitting: Internal Medicine

## 2008-12-31 ENCOUNTER — Ambulatory Visit: Payer: Self-pay | Admitting: Family Medicine

## 2009-01-01 ENCOUNTER — Telehealth: Payer: Self-pay | Admitting: Family Medicine

## 2009-01-11 ENCOUNTER — Telehealth (INDEPENDENT_AMBULATORY_CARE_PROVIDER_SITE_OTHER): Payer: Self-pay | Admitting: *Deleted

## 2009-01-24 ENCOUNTER — Encounter: Payer: Self-pay | Admitting: Family Medicine

## 2009-01-31 ENCOUNTER — Ambulatory Visit: Payer: Self-pay | Admitting: Family Medicine

## 2009-01-31 ENCOUNTER — Encounter: Payer: Self-pay | Admitting: Family Medicine

## 2009-02-11 ENCOUNTER — Telehealth: Payer: Self-pay | Admitting: Family Medicine

## 2009-02-15 ENCOUNTER — Telehealth: Payer: Self-pay | Admitting: Family Medicine

## 2009-02-21 ENCOUNTER — Telehealth (INDEPENDENT_AMBULATORY_CARE_PROVIDER_SITE_OTHER): Payer: Self-pay | Admitting: *Deleted

## 2009-02-22 ENCOUNTER — Ambulatory Visit: Payer: Self-pay | Admitting: Family Medicine

## 2009-02-22 ENCOUNTER — Telehealth: Payer: Self-pay | Admitting: Family Medicine

## 2009-02-22 DIAGNOSIS — D239 Other benign neoplasm of skin, unspecified: Secondary | ICD-10-CM

## 2009-02-22 LAB — CONVERTED CEMR LAB
ALT: 40 U/L — ABNORMAL HIGH
AST: 36 U/L
Albumin: 3.9 g/dL
Alkaline Phosphatase: 88 U/L
Amylase: 69 U/L
BUN: 6 mg/dL
Basophils Absolute: 0.1 10*3/uL
Basophils Relative: 0.7 %
Bilirubin, Direct: 0.1 mg/dL
CO2: 28 meq/L
Calcium: 9 mg/dL
Chloride: 105 meq/L
Creatinine, Ser: 0.8 mg/dL
Eosinophils Absolute: 0.4 10*3/uL
Eosinophils Relative: 5.3 % — ABNORMAL HIGH
GFR calc non Af Amer: 88.76 mL/min
Glucose, Bld: 95 mg/dL
HCT: 34.4 % — ABNORMAL LOW
Hemoglobin: 11.4 g/dL — ABNORMAL LOW
Lipase: 12 U/L
Lymphocytes Relative: 30.2 %
Lymphs Abs: 2.4 10*3/uL
MCHC: 33 g/dL
MCV: 82.2 fL
Monocytes Absolute: 0.5 10*3/uL
Monocytes Relative: 6.4 %
Neutro Abs: 4.7 10*3/uL
Neutrophils Relative %: 57.4 %
Platelets: 284 10*3/uL
Potassium: 3.8 meq/L
RBC: 4.19 M/uL
RDW: 13.5 %
Sodium: 141 meq/L
Total Bilirubin: 0.6 mg/dL
Total Protein: 7 g/dL
WBC: 8.1 10*3/uL

## 2009-02-27 ENCOUNTER — Encounter: Payer: Self-pay | Admitting: Family Medicine

## 2009-03-05 ENCOUNTER — Encounter: Payer: Self-pay | Admitting: Family Medicine

## 2009-03-11 ENCOUNTER — Telehealth: Payer: Self-pay | Admitting: Family Medicine

## 2009-03-12 ENCOUNTER — Telehealth: Payer: Self-pay | Admitting: Family Medicine

## 2009-03-14 ENCOUNTER — Encounter: Admission: RE | Admit: 2009-03-14 | Discharge: 2009-03-14 | Payer: Self-pay | Admitting: Family Medicine

## 2009-03-15 ENCOUNTER — Encounter (INDEPENDENT_AMBULATORY_CARE_PROVIDER_SITE_OTHER): Payer: Self-pay | Admitting: *Deleted

## 2009-03-15 ENCOUNTER — Telehealth: Payer: Self-pay | Admitting: Family Medicine

## 2009-03-15 DIAGNOSIS — R112 Nausea with vomiting, unspecified: Secondary | ICD-10-CM | POA: Insufficient documentation

## 2009-03-19 ENCOUNTER — Telehealth: Payer: Self-pay | Admitting: Internal Medicine

## 2009-04-08 ENCOUNTER — Telehealth: Payer: Self-pay | Admitting: Internal Medicine

## 2009-04-08 ENCOUNTER — Telehealth: Payer: Self-pay | Admitting: Family Medicine

## 2009-04-17 ENCOUNTER — Ambulatory Visit: Payer: Self-pay | Admitting: Family

## 2009-04-17 DIAGNOSIS — J069 Acute upper respiratory infection, unspecified: Secondary | ICD-10-CM | POA: Insufficient documentation

## 2009-04-23 ENCOUNTER — Emergency Department (HOSPITAL_COMMUNITY): Admission: EM | Admit: 2009-04-23 | Discharge: 2009-04-23 | Payer: Self-pay | Admitting: Emergency Medicine

## 2009-04-23 ENCOUNTER — Telehealth (INDEPENDENT_AMBULATORY_CARE_PROVIDER_SITE_OTHER): Payer: Self-pay | Admitting: *Deleted

## 2009-04-28 ENCOUNTER — Observation Stay (HOSPITAL_COMMUNITY): Admission: EM | Admit: 2009-04-28 | Discharge: 2009-04-28 | Payer: Self-pay | Admitting: Emergency Medicine

## 2009-05-02 ENCOUNTER — Emergency Department (HOSPITAL_COMMUNITY): Admission: EM | Admit: 2009-05-02 | Discharge: 2009-05-02 | Payer: Self-pay | Admitting: Emergency Medicine

## 2009-05-04 ENCOUNTER — Observation Stay (HOSPITAL_COMMUNITY): Admission: EM | Admit: 2009-05-04 | Discharge: 2009-05-04 | Payer: Self-pay | Admitting: Emergency Medicine

## 2009-05-06 ENCOUNTER — Encounter: Payer: Self-pay | Admitting: Family Medicine

## 2009-05-07 ENCOUNTER — Telehealth: Payer: Self-pay | Admitting: Family Medicine

## 2009-05-24 ENCOUNTER — Ambulatory Visit: Payer: Self-pay | Admitting: Family Medicine

## 2009-05-24 DIAGNOSIS — F319 Bipolar disorder, unspecified: Secondary | ICD-10-CM | POA: Insufficient documentation

## 2009-05-24 LAB — CONVERTED CEMR LAB: Blood Glucose, Fingerstick: 110

## 2009-06-18 ENCOUNTER — Encounter: Payer: Self-pay | Admitting: Family Medicine

## 2009-06-21 ENCOUNTER — Ambulatory Visit: Payer: Self-pay | Admitting: Family Medicine

## 2009-06-24 ENCOUNTER — Encounter: Payer: Self-pay | Admitting: Family Medicine

## 2009-06-27 ENCOUNTER — Encounter: Payer: Self-pay | Admitting: Family Medicine

## 2009-06-28 ENCOUNTER — Encounter: Payer: Self-pay | Admitting: Family Medicine

## 2009-07-21 ENCOUNTER — Emergency Department: Payer: Self-pay | Admitting: Emergency Medicine

## 2010-01-16 ENCOUNTER — Ambulatory Visit: Payer: Self-pay | Admitting: Family Medicine

## 2010-01-27 ENCOUNTER — Telehealth: Payer: Self-pay | Admitting: Family Medicine

## 2010-01-28 ENCOUNTER — Ambulatory Visit: Payer: Self-pay | Admitting: Family Medicine

## 2010-02-17 ENCOUNTER — Telehealth: Payer: Self-pay | Admitting: Family Medicine

## 2010-02-20 ENCOUNTER — Ambulatory Visit: Payer: Self-pay | Admitting: Family Medicine

## 2010-02-21 ENCOUNTER — Telehealth (INDEPENDENT_AMBULATORY_CARE_PROVIDER_SITE_OTHER): Payer: Self-pay | Admitting: *Deleted

## 2010-03-14 ENCOUNTER — Telehealth (INDEPENDENT_AMBULATORY_CARE_PROVIDER_SITE_OTHER): Payer: Self-pay | Admitting: *Deleted

## 2010-03-17 ENCOUNTER — Telehealth: Payer: Self-pay | Admitting: Family Medicine

## 2010-03-18 ENCOUNTER — Encounter: Payer: Self-pay | Admitting: Family Medicine

## 2010-03-18 ENCOUNTER — Emergency Department: Payer: Self-pay | Admitting: Emergency Medicine

## 2010-03-19 ENCOUNTER — Telehealth: Payer: Self-pay | Admitting: Family Medicine

## 2010-03-27 ENCOUNTER — Telehealth: Payer: Self-pay | Admitting: Family Medicine

## 2010-03-31 ENCOUNTER — Ambulatory Visit: Payer: Self-pay | Admitting: Family Medicine

## 2010-04-01 ENCOUNTER — Telehealth (INDEPENDENT_AMBULATORY_CARE_PROVIDER_SITE_OTHER): Payer: Self-pay | Admitting: *Deleted

## 2010-04-01 ENCOUNTER — Telehealth: Payer: Self-pay | Admitting: Family Medicine

## 2010-05-04 ENCOUNTER — Encounter: Payer: Self-pay | Admitting: Obstetrics & Gynecology

## 2010-05-08 ENCOUNTER — Ambulatory Visit: Admit: 2010-05-08 | Payer: Self-pay | Admitting: Pulmonary Disease

## 2010-05-11 LAB — CONVERTED CEMR LAB
ALT: 26 U/L
AST: 24 U/L
Albumin: 4 g/dL
Alkaline Phosphatase: 78 U/L
Anti Nuclear Antibody(ANA): NEGATIVE
BUN: 11 mg/dL
Basophils Absolute: 0 10*3/uL
Basophils Relative: 0.1 %
Bilirubin, Direct: 0 mg/dL
CO2: 26 meq/L
Calcium: 9.2 mg/dL
Chloride: 107 meq/L
Creatinine, Ser: 0.8 mg/dL
Eosinophils Absolute: 0.1 10*3/uL
Eosinophils Relative: 1.1 %
GFR calc non Af Amer: 88.88 mL/min
Glucose, Bld: 92 mg/dL
HCT: 33.9 % — ABNORMAL LOW
Hemoglobin: 11.2 g/dL — ABNORMAL LOW
Lymphocytes Relative: 26 %
Lymphs Abs: 2.4 10*3/uL
MCHC: 33.2 g/dL
MCV: 83 fL
Monocytes Absolute: 0.5 10*3/uL
Monocytes Relative: 5 %
Neutro Abs: 6.3 10*3/uL
Neutrophils Relative %: 67.8 %
Platelets: 282 10*3/uL
Potassium: 3.9 meq/L
RBC: 4.09 M/uL
RDW: 13.2 %
Rheumatoid fact SerPl-aCnc: <20 [IU]/mL
Sed Rate: 33 mm/h — ABNORMAL HIGH
Sodium: 141 meq/L
TSH: 3.37 u[IU]/mL
Total Bilirubin: 0.6 mg/dL
Total Protein: 7.5 g/dL
WBC: 9.3 10*3/uL
ds DNA Ab: <1

## 2010-05-13 NOTE — Progress Notes (Signed)
 Summary: results - LMTCB  Phone Note Call from Patient   Caller: Patient Call For: clance Summary of Call: pt wants sleep study results. cancelled appt (for today) as she just had gallbladder surgery.  Initial call taken by: Rollo Barge,  September 21, 2007 10:01 AM  Follow-up for Phone Call        Pt had to cancel appt for follow-up on sleep study d/t gallbladder surgery. Would you talk with pt by phone regarding these results or do you want her to reschedule?  Katheryn Fell CMA  September 21, 2007 10:15 AM  Additional Follow-up for Phone Call Additional follow up Details #1::        she needs ov to discuss because the study is not straightforward.  Find out when she is available to come in, and we can work her in quickly Additional Follow-up by: Francis CHRISTELLA Dresser MD,  September 21, 2007 5:25 PM    Additional Follow-up for Phone Call Additional follow up Details #2::    LMTCB for pt to reschedule her appt, [facility] does not do results over the phone. Harlene Molt CNA  September 22, 2007 10:03 AM    According to EMR, pt has an upcoming appt with [facility] 10-03-07.  Will remove msg from triage inbox. Harlene Molt CNA  September 23, 2007 11:39 AM

## 2010-05-13 NOTE — Progress Notes (Signed)
 Summary: [place]--How she was treated  Phone Note Call from Patient   Caller: Spouse Summary of Call:  Pt husband is calling wanting to speak to Donna to tell her about his wife rheumatology appt and how they treated her and how upset she is about the way things went today.Call her husband Josh--cell# [phone].  He was told that they have left for today and he said it was okay for them to call him tomorrow. Initial call taken by: Tilton Dinsmore,  July 06, 2007 2:43 PM  Follow-up for Phone Call        Spoke with husband Sidra and he stated:patient went to rheumatology appt. today and that his wife was treated badly by the rheumatologist, no blood test done, Dr. stated per spouse that patient is fat and needs to lose weight.  The doctor there at rheumatology after touching her pressure points only stated that she did not have Lupus and she was just fat and needed to lose weight.  Husband would like another referral to a different doctor. I apologized on behalf of [facility] and would definitely let Dr. Antonio know.  ...................................................................Donna Haas  July 06, 2007 2:54 PM  Patient was seen by Dr. Everlean.  ...................................................................Donna Haas  July 06, 2007 2:55 PM  Follow-up by: Donna Haas,  July 06, 2007 2:54 PM  Additional Follow-up for Phone Call Additional follow up Details #1::        I called and left message with [facility].  Apologizing again and we will do another referral.  I know she has been to another rheum as well.   Pt may call back to verify that it was Dr Waddell.  that she also did not want to see.    Additional Follow-up by: Jamee Antonio DO,  July 07, 2007 10:29 AM    Additional Follow-up for Phone Call Additional follow up Details #2::    Spoke with patient and also does not want to see Dr. Myrtis and would like Dr. Antonio to call her at her earliest  convenience ...................................................................Donna Haas  July 07, 2007 10:54 AM  Follow-up by: Donna Haas,  July 07, 2007 10:54 AM  Additional Follow-up for Phone Call Additional follow up Details #3:: Details for Additional Follow-up Action Taken: spoke with pt who restated above problem with Dr Everlean.  She is willing to go to [facility] because her sister works there. Additional Follow-up by: Jamee Antonio DO,  July 07, 2007 1:12 PM

## 2010-05-13 NOTE — Progress Notes (Signed)
 Summary: PHONE-LEXAPRO   Phone Note Call from Patient Call back at [facility] Phone [phone]   Caller: Patient Summary of Call: PATIENT IS OUT OF LEXAPRO  AND IS STARTING TO HAVE WITHDRAWALS FROM IT. PATIENTS STATES SHE NEEDS SAMPLE. SHE DOES NOT HAVE ANY MONEY TO BUY HER PRESCRIPTION. Initial call taken by: Marjorie Bergeron,  March 12, 2009 9:13 AM  Follow-up for Phone Call        samples given.  Follow-up by: Edsel Fletcher CMA,  March 12, 2009 9:25 AM

## 2010-05-13 NOTE — Progress Notes (Signed)
  Phone Note Other Incoming   Request: Send information Summary of Call: Request received from Social Security Disability claims rep. Caesar Chestnut forwarded to [employer].

## 2010-05-13 NOTE — Assessment & Plan Note (Signed)
Summary: COUGH & CONGESTION/RH.....   Vital Signs:  Patient profile:   35 year old female Weight:      188.50 pounds BMI:     36.95 O2 Sat:      99 % on Room air Temp:     98.2 degrees F oral Pulse rate:   88 / minute Pulse rhythm:   regular Resp:     18 per minute BP sitting:   112 / 80  (right arm) Cuff size:   large  Vitals Entered By: Glendell Docker CMA (April 17, 2009 3:58 PM)  O2 Flow:  Room air  Primary Care Provider:  Laury Axon  CC:  URI symptoms.  History of Present Illness:  URI Symptoms      This is a 35 year old woman who presents with URI symptoms.  The patient reports nasal congestion, clear nasal discharge, sore throat, dry cough, and sick contacts, but denies earache.  Associated symptoms include stiff neck.  Notes that stiffness is primarily on the right side of her neck The patient denies fever.  The patient also reports itchy watery eyes, itchy throat, sneezing, headache, muscle aches, and severe fatigue.  Onset for the past 3 days, she is concerned that her lymph nodes made be swollen. Ibuprofen and Zinc with some relief.  Allergies: 1)  ! Prednisone 2)  ! Vicodin 3)  ! Compazine  Review of Systems       denies fever, mild nausea from nasal drainage but no vomitting.  Physical Exam  General:  Well-developed,well-nourished,in no acute distress; alert,appropriate and cooperative throughout examination Eyes:  PERRLA Ears:  External ear exam shows no significant lesions or deformities.  Otoscopic examination reveals clear canals, tympanic membranes are intact bilaterally without bulging, retraction, inflammation or discharge. Hearing is grossly normal bilaterally. Neck:  + tender enlarged Lymph node under base of chin/top of neck Lungs:  Normal respiratory effort, chest expands symmetrically. Lungs are clear to auscultation, no crackles or wheezes. Heart:  Normal rate and regular rhythm. S1 and S2 normal without gallop, murmur, click, rub or other extra  sounds. Abdomen:  Bowel sounds positive,abdomen soft and non-tender without masses, organomegaly or hernias noted.   Impression & Recommendations:  Problem # 1:  VIRAL URI (ICD-465.9) Assessment New I am concerned RE:  swollen lymph node on right side or neck.  Will plan to treat empically with augmentin.  Patient instructed to call if symptoms worsen or do not improve.  Complete Medication List: 1)  Levoxyl 50 Mcg Tabs (Levothyroxine sodium) .... Take 1 tab once daily 2)  Ambien 10 Mg Tabs (Zolpidem tartrate) .Marland Kitchen.. 1 by mouth every at bedtime as needed insomnia 3)  Prevacid 15 Mg Cpdr (Lansoprazole) .... Take 1 capsule by mouth once a day 4)  Augmentin 500-125 Mg Tabs (Amoxicillin-pot clavulanate) .... One tablet by mouth two times a day x 7 days  Patient Instructions: 1)  Please call if your symptoms worsen, if you develop increased swelling right side of neck, fever, or if your symptoms do not improve. Prescriptions: AUGMENTIN 500-125 MG TABS (AMOXICILLIN-POT CLAVULANATE) one tablet by mouth two times a day x 7 days  #14 x 0   Entered and Authorized by:   Lemont Fillers FNP   Signed by:   Lemont Fillers FNP on 04/17/2009   Method used:   [facility]        [facility]  [address] [phone]* (retail[facility]       [address]  [place], [place]  [address]       Ph: [phone] or [phone]       Fax: [phone]   RxID:   [id number]    Immunization History:  Tetanus/Td Immunization History:    Tetanus/Td:  historical (04/22/2004)     Preventive Care Screening  Pap Smear:    Date:  08/07/2008    Results:  normal   Last Tetanus Booster:    Date:  04/22/2004    Results:  Historical    Current Allergies (reviewed today): ! PREDNISONE ! VICODIN ! COMPAZINE  Appended Document: Orders Update    Clinical Lists Changes  Orders: Added new Service order of Rapid Strep [phone]) - Signed Observations: Added new observation  of RAPID STREP: negative (04/17/2009 11:31)      Laboratory Results    Other Tests  Rapid Strep: negative

## 2010-05-13 NOTE — Letter (Signed)
Summary: [facility]  [facility]   Imported By: Lanelle Bal 07/01/2009 09:08:19  _____________________________________________________________________  External Attachment:    Type:   Image     Comment:   External Document

## 2010-05-13 NOTE — Progress Notes (Signed)
 Summary: MEDS NOT WORKING  Phone Note Call from Patient Call back at Home Phone [phone]   Caller: Patient Call For: DR AVRAM Reason for Call: Refill Medication Summary of Call: DIXYCLOMINE IS NOT WORKING. WAS ADVICED TO CALL SO GALLBLADDER CAN BE RULLED OUT. Initial call taken by: Dyane Lerner [facility],  Aug 15, 2007 11:02 AM  Follow-up for Phone Call        LM TO [facility] Corean Ellen CMA  Aug 16, 2007 10:33 AM RC from pt.  States that she is still having a lot of abd. pain, nauseous all the time, also decreased appetite.  States she thinks dicyclomine is making pain and nausea worse.  Per Antonio report to get [facility] referral.  Is this OK, and what can pt do for pain until then?  Follow-up by: Corean Ellen CMA,  Aug 16, 2007 11:34 AM  Additional Follow-up for Phone Call Additional follow up Details #1::        1) [facility] CONSULT RE: RUQ PAIN, ? FROM GALLBLADDER 2) STOP DICYCLOMINE 3) IF DARVOCET NOT HELPING EITHER (ON MED LIST) THEN TRY VICODIN5/500 1 EVERY 4 HRS as needed # 30  Additional Follow-up by: Lupita FORBES Avram MD,  Aug 16, 2007 12:51 PM    Additional Follow-up for Phone Call Additional follow up Details #2::    spoke to pt. states that she takes the darvocet at night and it does help.  She does not like to take it during the day d/t being home alone with a small child with a seizure disorder.  Declines vicodin for same reason.  Advised we will get [facility] referral and call her with appt. details.  Advised to call office if pain becomes worse off dicyclomine.  Pt agreeable.  Follow-up by: Corean Ellen CMA,  Aug 16, 2007 4:31 PM  Additional Follow-up for Phone Call Additional follow up Details #3:: Details for Additional Follow-up Action Taken: RC from pt.  In extreme pain.  Advised to take darvocet, we will call to see if we can get her in with surgeon today, if not recommend pt go to ER.  Advised I will call her with surgeon info.  Corean Ellen CMA  Aug 18, 2007  10:24 AM PC to pt- she has appt with Dr. Kimble @ [facility] on 5/12 @ 12noon.  Pt aware this is a work in appt.  Advised Winni to go to ER if she gets into extreme pain before appt with surgeon.  Pt voices understanding and is agreeable. Additional Follow-up by: Corean Ellen CMA,  Aug 18, 2007 11:26 AM

## 2010-05-13 NOTE — Progress Notes (Signed)
 Summary: refill ambien [place]  FYI  Phone Note Call from Patient Call back at [facility] Phone [phone]   Caller: Patient Call For: ambien  Summary of Call: Pt husband called for pt refill on Ambien  .  Last refill #30 01/02/08  Lonney Gosling  February 01, 2008 2:37 PM  Initial call taken by: Lonney Gosling,  February 01, 2008 2:37 PM  Follow-up for Phone Call        ok to refill x1---Has pt ever had a sleep eval.  She should not be using ambien  everyday.  Needs sleep eval if she has not had one.  If she has I need to know where so we can get the records. Follow-up by: Jamee Shanks DO,  February 02, 2008 8:39 AM  Additional Follow-up for Phone Call Additional follow up Details #1::        Spoke with pt husband who said pt did have seep eval done, I asked asked husband Dr Shanks would like to know where so she can get records, husband said Shyonna will have to call back with that information, we are not available right now .Lonney Gosling  February 02, 2008 10:44 AM  Additional Follow-up by: Lonney Gosling,  February 02, 2008 10:44 AM      Prescriptions: AMBIEN  10 MG  TABS (ZOLPIDEM  TARTRATE) take one tablet at bedtime  #30 x 0   Entered by:   Lonney Gosling   Authorized by:   Jamee Shanks DO   Signed by:   Lonney Gosling on 02/02/2008   Method used:   Printed then faxed [facility] ...       [facility]  [address] [phone]* (retail)       [address]       [place], [place]  [address]       Ph: [phone] or [phone]       Fax: [phone]   RxID:   [id number]

## 2010-05-13 NOTE — Progress Notes (Signed)
Summary: re Ativan and Ambien//[place]  Phone Note Refill Request Call back at Home Phone [phone] Message from:  Patient  Refills Requested: Medication #1:  AMBIEN 10 MG  TABS take one tablet at bedtime  Medication #2:  ATIVAN 0.5 MG TABS 1 by mouth three times a day as needed. sent to [facility]  last ov 02/21/08  last refill ?Ativan #30 on 03/23/08, last refill ambienb #30 on 04/02/08  Initial call taken by: Kandice Hams,  May 01, 2008 3:25 PM Caller: Patient Summary of Call: need re  Follow-up for Phone Call        ok to refill x1 each Follow-up by: Loreen Freud DO,  May 01, 2008 4:35 PM      Prescriptions: ATIVAN 0.5 MG TABS (LORAZEPAM) 1 by mouth three times a day as needed  #30 x 0   Entered by:   Kandice Hams   Authorized by:   Loreen Freud DO   Signed by:   Kandice Hams on 05/02/2008   Method used:   Printed then faxed to ...       [facility]  [address] [phone]* (retail)       [address]       [place], [place]  [address]       Ph: [phone] or [phone]       Fax: [phone]   RxID:   [id number] AMBIEN 10 MG  TABS (ZOLPIDEM TARTRATE) take one tablet at bedtime  #30 x 0   Entered by:   Kandice Hams   Authorized by:   Loreen Freud DO   Signed by:   Kandice Hams on 05/02/2008   Method used:   Printed then faxed to ...       [facility]  [address] [phone]* (retail)       [address]       [place], [place]  [address]       Ph: [phone] or [phone]       Fax: [phone]   RxID:   [id number]   Appended Document: re Ativan and Ambien//[facility] leftmsg for pt [facility]

## 2010-05-13 NOTE — Progress Notes (Signed)
 Summary: ? about ultra sound/cbs  Phone Note Call from Patient Call back at Home Phone [phone]   Caller: Patient Summary of Call: patient has a question about us  / thyroid  Initial call taken by: Niels Spring,  February 01, 2008 11:39 AM  Follow-up for Phone Call        Patient wanted to know if her thyroid  is enlarged and per xray it is within normal limits. Donna Haas  February 02, 2008 11:16 AM  Follow-up by: Donna Haas,  February 02, 2008 11:16 AM

## 2010-05-13 NOTE — Consult Note (Signed)
 Summary: [facility]   Imported By: Avelina Oak 03/07/2008 10:41:20  _____________________________________________________________________  External Attachment:    Type:   Image     Comment:   External Document

## 2010-05-13 NOTE — Assessment & Plan Note (Signed)
 Summary: FLU SHOT/ALR   Nurse Visit   Allergies: 1)  ! Prednisone 2)  ! Vicodin

## 2010-05-13 NOTE — Assessment & Plan Note (Signed)
 Summary: new to estab,cbs   Vital Signs:  Patient Profile:   35 Years Old Female Height:     60 inches Weight:      198.2 pounds Pulse rate:   66 / minute Resp:     14 per minute BP sitting:   120 / 82  (left arm)  Vitals Entered By: Coleta Molt (May 30, 2007 9:54 AM)                 PCP:  Antonio  Chief Complaint:  discuss lupus and back pain was seen in er .  History of Present Illness: Pt here to establish. Pt was dx with lupus in 2002.  Pt on Plaquenil  and seeing [facility] but now is off everything.  Pt husband was laid off and she has not had continuity of care.  She say Medicaid Dr and was given Lyrica with no relief--- Pt now on Ambien  and darvocet. Her husband has a job with [employer].  Pt went to ER 2 weeks ago because of back pain and she was told her Liver Function were elevated.  Pt went back to ER 1 week ago for Chest Pain.  Pt was diagnosed with muscle spasm.  Pt told to take Muscle relaxer.  Pt told it may be her Gall Bladder. Pt also c/o b/l feet pain.   Pain worse in am and better as whe walks around but when she sits down and gets back up it is painful.    Pt also states the rheum told her she may have RA but dx never made definitely Pt c/o of abd and R sided back pain and low abd bloating.  Pt seen in [facility]--- see above    Current Allergies: ! PREDNISONE  Past Medical History:    GERD    endometriosis----[facility]    Lupus    back pain  Past Surgical History:    laporoscopy---x 3    thyroid  cyst----Dr Spree-- [place]    Appendectomy    [id number]---6 miscarriages   Family History:    Family History Depression-- M with bipolar    Family History High cholesterol    Family History Hypertension  Social History:    Occupation: not working because of Lupus    Married    Never Smoked    Alcohol use-no    Drug use-no    Regular exercise-yes   Risk Factors:  Tobacco use:  never Passive smoke exposure:  no Drug use:  no HIV high-risk behavior:   no Alcohol use:  no Exercise:  yes    Times per week:  3    Type:  walking Seatbelt use:  100 %  Family History Risk Factors:    Family History of MI in females < 32 years old:  no    Family History of MI in males < 15 years old:  no  PAP Smear History:     Date of Last PAP Smear:  03/24/2006    Results:  Normal    Review of Systems      See HPI   Physical Exam  General:     Well-developed,well-nourished,in no acute distress; alert,appropriate and cooperative throughout examination Eyes:     vision grossly intact, pupils equal, pupils round, pupils reactive to light, and no injection.   Ears:     External ear exam shows no significant lesions or deformities.  Otoscopic examination reveals clear canals, tympanic membranes are intact bilaterally without bulging, retraction, inflammation or discharge. Hearing is  grossly normal bilaterally. Lungs:     Normal respiratory effort, chest expands symmetrically. Lungs are clear to auscultation, no crackles or wheezes. Heart:     normal rate, regular rhythm, and no murmur.   Abdomen:     + Ruq pain with palpationsoft, normal bowel sounds, no distention, no masses, no guarding, and no rebound tenderness.   Msk:     normal ROM.   Neurologic:     alert & oriented X3.   Skin:     Intact without suspicious lesions or rashes Psych:     Cognition and judgment appear intact. Alert and cooperative with normal attention span and concentration. No apparent delusions, illusions, hallucinations    Impression & Recommendations:  Problem # 1:  GERD (ICD-530.81)  The following medications were removed from the medication list:    Prilosec Otc 20 Mg Tbec (Omeprazole magnesium) .SABRA... Take one tablet daily  Her updated medication list for this problem includes:    Omeprazole 20 Mg Tbec (Omeprazole) .SABRA... 1 by mouth once daily  Orders: Venipuncture (63584) TLB-B12 + Folate Pnl (17253_17392-A87/QNO) TLB-Lipid Panel (80061-LIPID) TLB-BMP  (Basic Metabolic Panel-BMET) (80048-METABOL) TLB-CBC Platelet - w/Differential (85025-CBCD) TLB-Hepatic/Liver Function Pnl (80076-HEPATIC) TLB-TSH (Thyroid  Stimulating Hormone) (84443-TSH) TLB-Rheumatoid Factor (RA) (13569-MJ) TLB-Sedimentation Rate (ESR) (85651-ESR) T-Antinuclear Antib (ANA) (13961-76099)  Diagnostics Reviewed:  Discussed lifestyle modifications, diet, antacids/medications, and preventive measures. Handout provided.   Problem # 2:  LUPUS ERYTHEMATOSUS (ICD-695.4)  Orders: Rheumatology Referral (Rheumatology) Venipuncture (63584) TLB-B12 + Folate Pnl (17253_17392-A87/QNO) TLB-Lipid Panel (80061-LIPID) TLB-BMP (Basic Metabolic Panel-BMET) (80048-METABOL) TLB-CBC Platelet - w/Differential (85025-CBCD) TLB-Hepatic/Liver Function Pnl (80076-HEPATIC) TLB-TSH (Thyroid  Stimulating Hormone) (84443-TSH) TLB-Rheumatoid Factor (RA) (13569-MJ) TLB-Sedimentation Rate (ESR) (85651-ESR) T-Antinuclear Antib (ANA) (13961-76099)   Problem # 3:  PREVENTIVE HEALTH CARE (ICD-V70.0) RTO cpe Orders: Venipuncture (63584) TLB-B12 + Folate Pnl (17253_17392-A87/QNO) TLB-Lipid Panel (80061-LIPID) TLB-BMP (Basic Metabolic Panel-BMET) (80048-METABOL) TLB-CBC Platelet - w/Differential (85025-CBCD) TLB-Hepatic/Liver Function Pnl (80076-HEPATIC) TLB-TSH (Thyroid  Stimulating Hormone) (84443-TSH) TLB-Rheumatoid Factor (RA) (86430-RA) TLB-Sedimentation Rate (ESR) (85651-ESR) T-Antinuclear Antib (ANA) (13961-76099)   Problem # 4:  ABDOMINAL BLOATING (ICD-787.3) + RUQ pain Orders: Radiology Referral (Radiology)  US  abd and pelvis  Orders: Radiology Referral (Radiology)   Problem # 5:  PLANTAR FASCIITIS, BILATERAL (ICD-728.71) Discussed use of gel inserts, ice massage, and stretching exercises.  podiatry if no better  Problem # 6:  SNORING, HX OF (ICD-V15.89)  Orders: Pulmonary Referral (Pulmonary)   Problem # 7:  ENDOMETRIOSIS OF OTHER SPECIFIED SITES (ICD-617.8) per  gyn Orders: Venipuncture (63584) TLB-B12 + Folate Pnl (17253_17392-A87/QNO) TLB-Lipid Panel (80061-LIPID) TLB-BMP (Basic Metabolic Panel-BMET) (80048-METABOL) TLB-CBC Platelet - w/Differential (85025-CBCD) TLB-Hepatic/Liver Function Pnl (80076-HEPATIC) TLB-TSH (Thyroid  Stimulating Hormone) (84443-TSH) TLB-Rheumatoid Factor (RA) (13569-MJ) TLB-Sedimentation Rate (ESR) (85651-ESR) T-Antinuclear Antib (ANA) (13961-76099)   Complete Medication List: 1)  Ambien  10 Mg Tabs (Zolpidem  tartrate) .... Take one tablet at bedtime 2)  Klonopin  0.5 Mg Tabs (Clonazepam ) .... Take one tablet daily 3)  Darvocet-n 100 100-650 Mg Tabs (Propoxyphene n-apap) .... Take 1-2 tablets every 4-6 hours as needed 4)  Zanaflex 2 Mg Caps (Tizanidine hcl) .... As needed 5)  Omeprazole 20 Mg Tbec (Omeprazole) .SABRA.. 1 by mouth once daily     Prescriptions: AMBIEN  10 MG  TABS (ZOLPIDEM  TARTRATE) take one tablet at bedtime  #30 x 0   Entered and Authorized by:   Jamee Shanks DO   Signed by:   Jamee Shanks DO on 05/30/2007   Method used:   Print then Give to Patient   RxID:   [id number]  ZANAFLEX 2 MG  CAPS (TIZANIDINE HCL) as needed  #60 x 2   Entered and Authorized by:   Jamee Shanks DO   Signed by:   Jamee Shanks DO on 05/30/2007   Method used:   Print then Give to Patient   RxID:   [id number] DARVOCET-N 100 100-650 MG  TABS (PROPOXYPHENE N-APAP) take 1-2 tablets every 4-6 hours as needed  #60 x 0   Entered and Authorized by:   Jamee Shanks DO   Signed by:   Jamee Shanks DO on 05/30/2007   Method used:   Print then Give to Patient   RxID:   [id number] KLONOPIN  0.5 MG  TABS (CLONAZEPAM ) take one tablet daily  #30 x 2   Entered and Authorized by:   Jamee Shanks DO   Signed by:   Jamee Shanks DO on 05/30/2007   Method used:   Print then Give to Patient   RxID:   [id number] OMEPRAZOLE 20 MG  TBEC (OMEPRAZOLE) 1 by mouth once daily  #30 x 0   Entered and Authorized by:   Jamee Shanks  DO   Signed by:   Jamee Shanks DO on 05/30/2007   Method used:   Print then Give to Patient   RxID:   [id number]  ]

## 2010-05-13 NOTE — Progress Notes (Signed)
 Summary: lab order  Phone Note Call from Patient   Caller: Patient Summary of Call: Donna Haas patient states that her rheumat. called wanting her to have labs done at our office.  Initial call taken by: Annabella Kerns,  October 20, 2007 1:58 PM  Follow-up for Phone Call        Spoke with michelle patient needs to contact her rheumatology physican due nothing being in the system where her doctor has contacted Donna Haas. Follow-up by: Annabella Kerns,  October 20, 2007 2:00 PM  Additional Follow-up for Phone Call Additional follow up Details #1::        Patients doctor is in [place]. Patient is not able to drive on highways and doesnt drive in the rain. She wanted to have lab work at our office. I explained that we do not do outside labs at our office unless they are threw [place]. Patient was upset and would like to speak with Donna Haas.  Additional Follow-up by: Annabella Kerns,  October 20, 2007 2:02 PM    Additional Follow-up for Phone Call Additional follow up Details #2::    Donna Haas,  Please advise.  Rosaline Elliot  October 20, 2007 2:08 PM  Follow-up by: Rosaline Elliot,  October 20, 2007 2:08 PM  Additional Follow-up for Phone Call Additional follow up Details #3:: Details for Additional Follow-up Action Taken: I know her --- we will do them-- I just need to know what he wants-- Rosaline can you call her?  YLowne  10/20/07  310pm Patient is calling [facility] now and having the doctor there fax over the labs that he wants drawn and patient will call us  to schedule the labs. Rosaline Elliot  October 20, 2007 3:11 PM  Additional Follow-up by: Rosaline Elliot,  October 20, 2007 3:11 PM

## 2010-05-13 NOTE — Progress Notes (Signed)
Summary: PLAQUENIL 11/14, 11/16  Phone Note Other Incoming   Summary of Call: PER CALL FROM DR. VALEN'S OFFICE, DR. VALEN WILL BE HAPPY TO PRESCRIBE/FILL RX FOR PLAZUENIL & CONTACT THE PATIENT. Initial call taken by: Magdalen [facility],  February 21, 2010 8:14 AM  Follow-up for Phone Call        [place]!  Thankyou!   Let pt know Follow-up by: Loreen Freud DO,  February 23, 2010 7:25 PM  Additional Follow-up for Phone Call Additional follow up Details #1::        Left message to call back Almeta Monas CMA Duncan Dull)  February 24, 2010 3:47 PM Left message to call back Almeta Monas CMA Duncan Dull)  February 26, 2010 2:39 PM  called to patient and someone picked up and hung up....unable to reach patient but per previous note Dr.Valen's office will contact pt.....  Additional Follow-up by: Almeta Monas CMA Duncan Dull),  March 03, 2010 4:41 PM

## 2010-05-13 NOTE — Progress Notes (Signed)
 Summary: meds  Phone Note Call from Patient Call back at [facility] Phone [phone]   Summary of Call: Pt called and said the new sleep meds we tried her on did not work, she would like what to do next and if she should try the ambien  again? Pt needs refill.  Initial call taken by: Edsel Fletcher CMA,  March 11, 2009 11:47 AM  Follow-up for Phone Call        ambien  written  Follow-up by: Jamee Shanks DO,  March 11, 2009 12:25 PM  Additional Follow-up for Phone Call Additional follow up Details #1::        [facility] [address] , pt aware.  Additional Follow-up by: Edsel Fletcher CMA,  March 11, 2009 12:53 PM    New/Updated Medications: AMBIEN  10 MG TABS (ZOLPIDEM  TARTRATE) 1 by mouth at bedtime Prescriptions: AMBIEN  10 MG TABS (ZOLPIDEM  TARTRATE) 1 by mouth at bedtime  #30 x 0   Entered and Authorized by:   Jamee Shanks DO   Signed by:   Jamee Shanks DO on 03/11/2009   Method used:   Print then Give to Patient   RxID:   [id number]

## 2010-05-13 NOTE — Assessment & Plan Note (Signed)
 Summary: flu shot   Nurse Visit    Prior Medications: AMBIEN  10 MG  TABS (ZOLPIDEM  TARTRATE) take one tablet at bedtime ZANAFLEX 2 MG  CAPS (TIZANIDINE HCL) as needed NEXIUM 40 MG  CPDR (ESOMEPRAZOLE MAGNESIUM) Take 1 tablet by mouth once a day DILAUDID   4MG  () 1 by mouth every 4 hours as needed Current Allergies: ! PREDNISONE ! VICODIN    Orders Added: 1)  Admin 1st Vaccine [90471] 2)  Flu Vaccine 92yrs + [09341]   Flu Vaccine Consent Questions     Do you have a history of severe allergic reactions to this vaccine? no    Any prior history of allergic reactions to egg and/or gelatin? no    Do you have a sensitivity to the preservative Thimersol? no    Do you have a past history of Guillan-Barre Syndrome? no    Do you currently have an acute febrile illness? no    Have you ever had a severe reaction to latex? no    Vaccine information given and explained to patient? yes    Are you currently pregnant? no    Lot Number:[id number]   Exp Date:10/10/2008   Site Given  Left Deltoid IM].opcflu

## 2010-05-13 NOTE — Progress Notes (Signed)
 Summary: ativan  refill//[facility]  Phone Note Refill Request Message from:  Patient  Refills Requested: Medication #1:  ATIVAN  0.5 MG TABS 1 by mouth three times a day as needed. pt called for refill of her Ativan , [facility]  for this refill last refill #30 02/16/08  last ov 02/21/08  Initial call taken by: Lonney Gosling,  March 23, 2008 3:12 PM  Follow-up for Phone Call        ok to refill x1 Follow-up by: Jamee Shanks DO,  March 23, 2008 3:13 PM  Additional Follow-up for Phone Call Additional follow up Details #1::        Left msg to pt [facility] .Lonney Gosling  March 23, 2008 3:20 PM  Additional Follow-up by: Lonney Gosling,  March 23, 2008 3:20 PM      Prescriptions: ATIVAN  0.5 MG TABS (LORAZEPAM ) 1 by mouth three times a day as needed  #30 x 0   Entered by:   Lonney Gosling   Authorized by:   Jamee Shanks DO   Signed by:   Lonney Gosling on 03/23/2008   Method used:   Printed then faxed to ...       [facility] [address] [phone]* (retail)       [address]       [place], [place]  [address]       Ph: [phone] or [phone]       Fax: [phone]   RxID:   [id number]

## 2010-05-13 NOTE — Letter (Signed)
 Summary: Results Follow up Letter   at [place]  [address] [place], [place] [address]   Phone: [phone]  Fax: [phone]    12/14/2007 MRN: [id number]  [facility] SENIC [address] [place], [place]  [address]  Dear [name],  The following are the results of your recent test(s):  Test         Result    Pap Smear:        Normal _____  Not Normal _____ Comments: ______________________________________________________ Cholesterol: LDL(Bad cholesterol):         Your goal is less than:         HDL (Good cholesterol):       Your goal is more than: Comments:  ______________________________________________________ Mammogram:        Normal _____  Not Normal _____ Comments:  ___________________________________________________________________ Hemoccult:        Normal _____  Not normal _______ Comments:    _____________________________________________________________________ Other Tests: Please see attached labs.    We routinely do not discuss normal results over the telephone.  If you desire a copy of the results, or you have any questions about this information we can discuss them at your next office visit.   Sincerely,

## 2010-05-13 NOTE — Progress Notes (Signed)
 Summary: ambien  rx/ [place]  Phone Note Refill Request Call back at [facility] Phone [phone]   Refills Requested: Medication #1:  AMBIEN  10 MG  TABS take one tablet at bedtime [facility] [address], please call patient at home #.  Initial call taken by: Rosaline Elliot,  January 24, 2008 11:52 AM Summary of Call: Needs [facility]ill [facility]ien [facility] [address]  Follow-up for Phone Call        [facility] refill #30 no refill 01/02/08, last ov 12/13/07 .Lonney Gosling  January 24, 2008 12:11 PM  Follow-up by: Lonney Gosling,  January 24, 2008 12:11 PM  Additional Follow-up for Phone Call Additional follow up Details #1::        Too soon! Additional Follow-up by: Jamee Shanks DO,  January 24, 2008 1:10 PM    Additional Follow-up for Phone Call Additional follow up Details #2::    left message for pt to call back.  in reguards that it is too soon for ambien  Follow-up by: Jackson Garner CMA,  January 24, 2008 4:17 PM  Additional Follow-up for Phone Call Additional follow up Details #3:: Details for Additional Follow-up Action Taken: pt aware too soon Additional Follow-up by: Jackson Garner CMA,  January 24, 2008 4:50 PM

## 2010-05-13 NOTE — Progress Notes (Signed)
" °  Phone Note Call from Patient Call back at [phone]   Caller: Spouse Summary of Call: Having bad headache today Husband called because she seemed to have some trouble with memory--forgot circumstances of a fall earlier today--then her memory got back to normal Has active lupus  P: I expressed concern about severe headache in view of lupus. Recommended he bring her to ER. He really didn't want to now but agreed to take her if her headache didn't improve Initial call taken by: Charlie Richmond Denise MD,  December 24, 2008 10:45 PM    "

## 2010-05-13 NOTE — Progress Notes (Signed)
" °  Phone Note Call from Patient Call back at Home Phone [phone]   Caller: Spouse Summary of Call: Husband calls at 12:05AM Lots of stress due to possible seizure in daughter and ensuing work up She was on couch and suddenly out of it with googly eyes No tonic or clonic posturing Lasted  ~30 seconds Then had confused period of about 30 seconds where she didn't realize where she was Now back to normal He is concerned she may have had a seizure  P: Not clear that this is a seirzure     If happens again, bring her to ER for eval, otherwise should be evaluated in office ASAP to decide whether more needs to be done Initial call taken by: Charlie Richmond Denise MD,  June 14, 2007 8:44 AM         Appended Document:  Please make sure pt went to ER yesterday  Appended Document:  no emergency room visit  Appended Document:  Pt needs OV to be evaluated if not seen in ER "

## 2010-05-13 NOTE — Progress Notes (Signed)
 Summary: refill ambien /Dr Antonio advise please  Phone Note Call from Patient   Caller: Patient Summary of Call: Dr. Antonio [address] Patient needs a refill for ambien  refilled Initial call taken by: Annabella Kerns,  October 13, 2007 12:13 PM  Follow-up for Phone Call        ok x1 only Follow-up by: Jamee Antonio DO,  October 13, 2007 4:32 PM  Additional Follow-up for Phone Call Additional follow up Details #1::        Patient aware rx sent via fax. Rosaline Elliot  October 13, 2007 4:42 PM (Left message).  Additional Follow-up by: Rosaline Elliot,  October 13, 2007 4:42 PM      Prescriptions: AMBIEN  10 MG  TABS (ZOLPIDEM  TARTRATE) take one tablet at bedtime  #30 x 0   Entered by:   Rosaline Elliot   Authorized by:   Jamee Antonio DO   Signed by:   Rosaline Elliot on 10/13/2007   Method used:   [facility]ed to ...       [facility] [address] [phone]*       [address]       [place], [place]  [address]       Ph: [phone] or [phone]       Fax: [phone]   RxID:   [id number]

## 2010-05-13 NOTE — Progress Notes (Signed)
 Summary: Ambien  concerns   Phone Note Call from Patient Call back at [facility] Phone [phone]   Summary of Call: Pt called and left voicemail stating that the ambien  does not seem to be helping her would like to know if there is something different she can do.  Initial call taken by: Edsel Fletcher CMA,  February 15, 2009 1:42 PM  Follow-up for Phone Call        It's because she takes it every day --her body has gotten used to it---  they will all be the same--- She really needs to come off of it for a while and then if she needs it again only take it 3 days a week or less. Follow-up by: Jamee Shanks DO,  February 15, 2009 4:57 PM  Additional Follow-up for Phone Call Additional follow up Details #1::        LMTCB.  Additional Follow-up by: Edsel Fletcher CMA,  February 15, 2009 4:59 PM    Additional Follow-up for Phone Call Additional follow up Details #2::    pt states she cannot come off of the medicatin because then she will not sleep at all. she had a sleep study done and she has insomnia.  Follow-up by: Edsel Fletcher CMA,  February 15, 2009 5:01 PM  Additional Follow-up for Phone Call Additional follow up Details #3:: Details for Additional Follow-up Action Taken: Her body is used to it--  giving her body a break is the only way to make sure it con't to work.  All other meds will do the same thing.  Pt aware. states without med she is unable to sleep and she has to take it every night. informed pt that this med  is not to be taking on regular basis due to the fact that the body will build up a tolerate to med. pt then said i won;t be able to sleep without it then pt said ok and hung up phone...................SABRAFelecia Deloach CMA  February 15, 2009 5:18 PM  Additional Follow-up by: Jamee Shanks DO,  February 15, 2009 5:12 PM

## 2010-05-13 NOTE — Progress Notes (Signed)
 Summary: lmtc 05-03-08 kidney stone  Phone Note Call from Patient   Summary of Call: dr lowne pls advise  pt called stating that she thinks that she passed another kidney stone last night and want to know is there anything she needs to do about it.Felecia Deloach CMA  May 03, 2008 9:28 AM  Follow-up for Phone Call        If she passed it and pain is gone nothing needs to be done---if she has pain--needs ov Follow-up by: Jamee Shanks DO,  May 03, 2008 12:57 PM  Additional Follow-up for Phone Call Additional follow up Details #1::        left message to call  office.Felecia Deloach CMA  May 03, 2008 3:49 PM    Additional Follow-up for Phone Call Additional follow up Details #2::    pt aware.Felecia Deloach CMA  May 03, 2008 4:27 PM

## 2010-05-13 NOTE — Progress Notes (Signed)
 Summary: no better  Phone Note Call from Patient   Summary of Call: pt left message that tramadol is not working and wants to know if you could give her something else because she is still feeling really bad................SABRAFelecia Deloach CMA  December 19, 2008 11:35 AM   Follow-up for Phone Call        darvocet N 100 1 by mouth every 6 hours as needed  #30    Follow-up by: Jamee Shanks DO,  December 19, 2008 12:52 PM  Additional Follow-up for Phone Call Additional follow up Details #1::        pt aware rx sent to pharmacy.Felecia Deloach CMA  December 19, 2008 3:23 PM     New/Updated Medications: DARVOCET-N 100 100-650 MG TABS (PROPOXYPHENE N-APAP) take  1 by mouth every 6 hours as needed Prescriptions: DARVOCET-N 100 100-650 MG TABS (PROPOXYPHENE N-APAP) take  1 by mouth every 6 hours as needed  #30 x 0   Entered by:   Jackson Garner CMA   Authorized by:   Jamee Shanks DO   Signed by:   Jackson Garner CMA on 12/19/2008   Method used:   Printed then [facility]ed [facility] ...       [facility] [address] [phone][facility] (retail)       [address]       [place], [place]  [address]       Ph: [phone] or [phone]       Fax: [phone]   RxID:   [id number]

## 2010-05-13 NOTE — Progress Notes (Signed)
 Summary: Results [facility] 12/3)   Phone Note Outgoing Call   Summary of Call: Regarding US  of the abdomen:  + fatty liver only Initial call taken by: Edsel Fletcher CMA,  March 15, 2009 10:02 AM  Follow-up for Phone Call        Pt states she has had 3 episiodes of vomitting and extreme pain-pt would like a referral or further advice?  Follow-up by: Edsel Fletcher CMA,  March 15, 2009 10:58 AM  Additional Follow-up for Phone Call Additional follow up Details #1::        If she is in extreme pain she needs to go to ER---we will put GI referral in but it will not be today---she needs to go somewhere they can evaluate her now.    Additional Follow-up by: Jamee Shanks DO,  March 15, 2009 2:03 PM  New Problems: NAUSEA AND VOMITING (ICD-787.01)   Additional Follow-up for Phone Call Additional follow up Details #2::    LMTCB.  Follow-up by: Edsel Fletcher CMA,  March 15, 2009 3:18 PM  Additional Follow-up for Phone Call Additional follow up Details #3:: Details for Additional Follow-up Action Taken: pt aware, she states that she does not like going to the ER. But if she has an episoide she will go if she cannot get into see dr.lowne. She states the episodes are only once every 2 weeks.   referral put in  yrlowne 5pm 03/15/2009 Additional Follow-up by: Edsel Fletcher CMA,  March 15, 2009 3:56 PM  New Problems: NAUSEA AND VOMITING (ICD-787.01)

## 2010-05-13 NOTE — Progress Notes (Signed)
 Summary: ambien  rx//LOWNE  Phone Note Refill Request Call back at Home Phone [phone] Message from:  Patient  Refills Requested: Medication #1:  AMBIEN  10 MG  TABS take one tablet at bedtime please call LAST OV 05/08/08, LAST REFILL #30 ON 05/02/08  Initial call taken by: Lonney Gosling,  May 31, 2008 2:20 PM  Follow-up for Phone Call        ok x1  2 refills  Follow-up by: Jamee Shanks DO,  May 31, 2008 3:57 PM  Additional Follow-up for Phone Call Additional follow up Details #1::        [facility]Lonney Gosling  June 01, 2008 10:00 AM  Additional Follow-up by: Lonney Gosling,  June 01, 2008 10:00 AM      Prescriptions: AMBIEN  10 MG  TABS (ZOLPIDEM  TARTRATE) take one tablet at bedtime  #30 x 2   Entered by:   Lonney Gosling   Authorized by:   Jamee Shanks DO   Signed by:   Lonney Gosling on 06/01/2008   Method used:   Printed then faxed to ...       [facility] [address] [phone]* (retail)       [address]       [place], [place]  [address]       Ph: [phone] or [phone]       Fax: [phone]   RxID:   [id number]

## 2010-05-13 NOTE — Progress Notes (Signed)
 Summary: AMBIEN  RX//LOWNE NEED TODAY  Phone Note Refill Request Message from:  Patient  Refills Requested: Medication #1:  AMBIEN  10 MG  TABS take one tablet at bedtime pt left msg [place] will be running out on monday can I get this filled before [place] leave [place] [facility] [address]  LAST OV 09/21/08, LAST REFILL #30 X 0 ON 10/19/08  Initial call taken by: Lonney Gosling,  November 16, 2008 10:19 AM  Follow-up for Phone Call        refill x1 Follow-up by: Jamee Shanks DO,  November 16, 2008 11:53 AM  Additional Follow-up for Phone Call Additional follow up Details #1::        RX FAXED PT INFORMED Additional Follow-up by: Lonney Gosling,  November 16, 2008 2:36 PM    Prescriptions: AMBIEN  10 MG  TABS (ZOLPIDEM  TARTRATE) take one tablet at bedtime  #30 x 0   Entered by:   Lonney Gosling   Authorized by:   Jamee Shanks DO   Signed by:   Lonney Gosling on 11/16/2008   Method used:   Printed then faxed to ...       [facility] [address] [phone]* (retail)       [address]       [place], [place]  [address]       Ph: [phone] or [phone]       Fax: [phone]   RxID:   [id number]

## 2010-05-13 NOTE — Assessment & Plan Note (Signed)
 Summary: COUGH//FD   Vital Signs:  Patient profile:   35 year old female Weight:      185 pounds O2 Sat:      97 % Temp:     98.7 degrees F oral Pulse rate:   80 / minute BP sitting:   110 / 70  (left arm)  Vitals Entered By: Coleta Molt (October 11, 2008 2:29 PM) CC: cough and chest congestion xmon has taken mucinex    History of Present Illness: Donna Haas here today for cough and chest congestion.  sxs started Monday- pt reports she thought is was allergies.  cough has worsened and now pt feels achy and was unable to sleep last night.  no fevers.  cough is intermittantly productive- mucinex helps.  no known sick contacts.  mild nasal congestion.  no ear pain.  mild facial pain under eyes.  no tooth pain.  hx of seasonal allergies- taking Claritin  as needed.  Allergies (verified): 1)  ! Prednisone 2)  ! Vicodin  Past History:  Past Medical History: Last updated: 04/16/2008 GERD endometriosis----Kaplan Lupus back pain Current Problems:  PLANTAR FASCIITIS, BILATERAL (ICD-728.71) ABDOMINAL PAIN OTHER SPECIFIED SITE (ICD-789.09) BACK PAIN (ICD-724.5) ABDOMINAL BLOATING (ICD-787.3) SNORING, HX OF (ICD-V15.89) PREVENTIVE HEALTH CARE (ICD-V70.0) FAMILY HISTORY DEPRESSION (ICD-V17.0) THYROID  CYST (ICD-246.2)  LUPUS ERYTHEMATOSUS (ICD-695.4) ENDOMETRIOSIS OF OTHER SPECIFIED SITES (ICD-617.8) GERD (ICD-530.81) HERNIATED LUMBAR DISC (ICD-722.10)   Anxiety Nephrolithiasis, hx of Pancreatitis, hx of  Review of Systems      See HPI  Physical Exam  General:  Well-developed,well-nourished,in no acute distress; alert,appropriate and cooperative throughout examination Head:  + TTP over maxillary sinuses Eyes:  no injxn or inflammation Ears:  External ear exam shows no significant lesions or deformities.  Otoscopic examination reveals clear canals, tympanic membranes are intact bilaterally without bulging, retraction, inflammation or discharge. Hearing is grossly normal  bilaterally. Nose:  markedly edematous turbinates Mouth:  Oral mucosa and oropharynx without lesions or exudates.  Teeth in good repair. Neck:  No deformities, masses, or tenderness noted. Lungs:  Normal respiratory effort, chest expands symmetrically. Lungs are clear to auscultation, no crackles or wheezes.  + hacking cough Heart:  reg S1/S2 Cervical Nodes:  No lymphadenopathy noted   Impression & Recommendations:  Problem # 1:  BRONCHITIS- ACUTE (ICD-466.0) Assessment New pt's hacking cough consistent w/ bronchitis.  start Amox and codeine cough syrup (pt reports she has taken in past w/out difficulty).  reviewed supportive care and red flags that should prompt return.  Pt expresses understanding and is in agreement w/ this plan. Her updated medication list for this problem includes:    Amoxicillin  500 Mg Tabs (Amoxicillin ) .SABRA... 2 tabs by mouth two times a day x10 days    Cheratussin Ac 100-10 Mg/5ml Syrp (Guaifenesin-codeine) .SABRA... 1-2 tsps q4-6 as needed.  will cause drowsiness.  disp  Problem # 2:  ACUTE SINUSITIS, UNSPECIFIED (ICD-461.9) Assessment: Unchanged pt w/ maxillary sinusitis.  start amox as above.  use nasal steroid spray to decrease turbinate edema.  reviewed supportive care and red flags that should prompt return. Her updated medication list for this problem includes:    Amoxicillin  500 Mg Tabs (Amoxicillin ) .SABRA... 2 tabs by mouth two times a day x10 days    Fluticasone Propionate 50 Mcg/act Susp (Fluticasone propionate) .SABRA... 2 sprays each nostril once daily    Cheratussin Ac 100-10 Mg/5ml Syrp (Guaifenesin-codeine) .SABRA... 1-2 tsps q4-6 as needed.  will cause drowsiness.  disp  Complete Medication List:  1)  Ambien  10 Mg Tabs (Zolpidem  tartrate) .... Take one tablet at bedtime 2)  Levoxyl 50 Mcg Tabs (Levothyroxine sodium) .... Take 1 tab once daily 3)  Prevacid 15 Mg Cpdr (Lansoprazole) .SABRA.. 1 by mouth once daily 4)  Ativan  0.5 Mg Tabs (Lorazepam ) .SABRA.. 1 by  mouth three times a day as needed 5)  Amoxicillin  500 Mg Tabs (Amoxicillin ) .... 2 tabs by mouth two times a day x10 days 6)  Fluticasone Propionate 50 Mcg/act Susp (Fluticasone propionate) .... 2 sprays each nostril once daily 7)  Cheratussin Ac 100-10 Mg/5ml Syrp (Guaifenesin-codeine) .SABRA.. 1-2 tsps q4-6 as needed.  will cause drowsiness.  disp  Patient Instructions: 1)  Please schedule a follow-up appointment as needed 2)  Take the amoxicillin  as directed- take w/ food to avoid upset stomach. 3)  Eat 1 yogurt daily to prevent yeast 4)  Use the codeine cough syrup for night and weekend cough- will cause drowsiness so no driving 5)  Take Mucinex DM to thin congestion and suppress cough 6)  Use the nasal spray daily 7)  Drink plenty of fluids 8)  Take tylenol /ibuprofen  as needed for pain or fever 9)  Hang in there and have a great 4th!  Prescriptions: ATIVAN  0.5 MG TABS (LORAZEPAM ) 1 by mouth three times a day as needed  #30 x 0   Entered and Authorized by:   Comer Greet MD   Signed by:   Comer Greet MD on 10/11/2008   Method used:   Print then Give to Patient   RxID:   [id number] CHERATUSSIN AC 100-10 MG/5ML SYRP (GUAIFENESIN-CODEINE) 1-2 tsps Q4-6 as needed.  will cause drowsiness.  disp  #150 x 0   Entered and Authorized by:   Comer Greet MD   Signed by:   Comer Greet MD on 10/11/2008   Method used:   Print then Give to Patient   RxID:   [id number] FLUTICASONE PROPIONATE 50 MCG/ACT  SUSP (FLUTICASONE PROPIONATE) 2 sprays each nostril once daily  #1 x 3   Entered and Authorized by:   Comer Greet MD   Signed by:   Comer Greet MD on 10/11/2008   Method used:   Print then Give to Patient   RxID:   [id number] AMOXICILLIN  500 MG TABS (AMOXICILLIN ) 2 tabs by mouth two times a day x10 days  #40 x 0   Entered and Authorized by:   Comer Greet MD   Signed by:   Comer Greet MD on 10/11/2008   Method used:   Print then Give  to Patient   RxID:   [id number]

## 2010-05-13 NOTE — Progress Notes (Signed)
 Summary: Ambien  refill   Phone Note Refill Request   Refills Requested: Medication #1:  AMBIEN  10 MG  TABS take one tablet at bedtime   Last Refilled: 12/14/2008 Last OV was 12/31/08. It runs out on sunday is it fine to fill today?   Initial call taken by: Danielle Barmer CMA,  January 11, 2009 10:39 AM  Follow-up for Phone Call        yes--refill x1 Follow-up by: Yvonne Lowne DO,  January 11, 2009 11:42 AM    Prescriptions: AMBIEN 10 MG  TABS (ZOLPIDEM TARTRATE) take one tablet at bedtime  #30 x 0   Entered by:   Felecia Deloach CMA   Authorized by:   Yvonne Lowne DO   Signed by:   Felecia Deloach CMA on 01/11/2009   Method used:   Printed then faxed to ...       [facility]  [address] [id number] (retail)       [address]       [place], [place]  [address]       Ph: [phone] or [phone]       Fax: [phone]   RxID:   [id number]

## 2010-05-13 NOTE — Progress Notes (Signed)
 Summary: lowne--rx ambien   dr Mahlon in the absence of lowne  Phone Note Refill Request   Refills Requested: Medication #1:  AMBIEN  10 MG  TABS take one tablet at bedtime [facility] [address]--ph-[phone] fax-[phone]--last filled--11.22.09  Initial call taken by: Tilton Dinsmore,  April 02, 2008 3:16 PM  Follow-up for Phone Call        pt called and left msg wants rx for ambien , filled before holiday, #30 03/04/08, last t ov 02/21/08. Please advise on the absence of Dr Antonio Follow-up by: Donna Haas,  April 02, 2008 3:25 PM  Additional Follow-up for Phone Call Additional follow up Details #1::        ok per Dr Mahlon x 1   Pt has been informed Additional Follow-up by: Donna Haas,  April 02, 2008 3:27 PM      Prescriptions: AMBIEN  10 MG  TABS (ZOLPIDEM  TARTRATE) take one tablet at bedtime  #30 x 0   Entered by:   Donna Haas   Authorized by:   Comer Mahlon MD   Signed by:   Donna Haas on 04/02/2008   Method used:   Printed then faxed to ...       [facility] [address] [phone]* (retail)       [address]       [place], [place]  [address]       Ph: [phone] or [phone]       Fax: [phone]   RxID:   [id number]

## 2010-05-13 NOTE — Consult Note (Signed)
 Summary: [facility]  [facility]   Imported By: Avelina Oak 03/13/2009 13:15:59  _____________________________________________________________________  External Attachment:    Type:   Image     Comment:   External Document

## 2010-05-13 NOTE — Progress Notes (Signed)
" °  Phone Note Call from Patient   Caller: Spouse Details for Reason: On Call Note Summary of Call: Recently completed amoxicillin ..symptoms no better, worsened cough, face pain, low grade temp, wheezing and  SOB. Initial call taken by: Greig Ring MD,  October 21, 2008 1:50 PM  Follow-up for Phone Call        Recommended Urgent Care eval for ? pneumonia. Follow-up by: Greig Ring MD,  October 21, 2008 1:53 PM     "

## 2010-05-13 NOTE — Assessment & Plan Note (Signed)
Summary: 1 MONTH FOLLOWUP///SPH   Vital Signs:  Patient profile:   35 year old female Weight:      172.6 pounds O2 Sat:      98 % on Room air Temp:     98.4 degrees F oral Pulse rate:   93 / minute Pulse rhythm:   regular BP sitting:   110 / 80  (left arm) Cuff size:   large  Vitals Entered By: Almeta Monas CMA Duncan Dull) (February 20, 2010 2:12 PM)  O2 Flow:  Room air CC: 1 mo f/u ----c/o pain throughtout the body and still having a cough   History of Present Illness: Pt here f/u pain---rheum wanted her to start plaquinel but would not start it per pt.   Pt dx of Lupus is questionalble as well.   Pt states it did help in the past.    See rheum notes.  We dw [facility] and msg will be given to Dr to consider starting meds.    Current Medications (verified): 1)  Levoxyl 50 Mcg Tabs (Levothyroxine Sodium) .... Take 1 Tab Once Daily 2)  Ambien 10 Mg Tabs (Zolpidem Tartrate) .Marland Kitchen.. 1 By Mouth Every At Bedtime As Needed Insomnia 3)  Prevacid 15 Mg Cpdr (Lansoprazole) .... Take 1 Capsule By Mouth Once A Day 4)  Klonopin 0.5 Mg Tabs (Clonazepam) .Marland Kitchen.. 1 By Mouth Two Times A Day 5)  Lexapro 10 Mg Tabs (Escitalopram Oxalate) .Marland Kitchen.. 1 By Mouth Once Daily 6)  Yaz 3-0.02 Mg Tabs (Drospirenone-Ethinyl Estradiol) .... By Mouth Once Daily 7)  Cheratussin Ac 100-10 Mg/66ml Syrp (Guaifenesin-Codeine) .Marland Kitchen.. 1-2 Tsp By Mouth At Bedtime As Needed 8)  Percocet 5-325 Mg Tabs (Oxycodone-Acetaminophen) .Marland Kitchen.. 1 By Mouth Every 6 Hours As Needed 9)  Vitamin D (Ergocalciferol) 50000 Unit Caps (Ergocalciferol) .Marland Kitchen.. 1 By Mouth Weekly  Allergies (verified): 1)  ! Prednisone 2)  ! Vicodin 3)  ! Compazine  Past History:  Past medical, surgical, family and social histories (including risk factors) reviewed for relevance to current acute and chronic problems.  Past Medical History: Reviewed history from 04/16/2008 and no changes required. GERD endometriosis----Kaplan Lupus back pain Current Problems:  PLANTAR  FASCIITIS, BILATERAL (ICD-728.71) ABDOMINAL PAIN OTHER SPECIFIED SITE (ICD-789.09) BACK PAIN (ICD-724.5) ABDOMINAL BLOATING (ICD-787.3) SNORING, HX OF (ICD-V15.89) PREVENTIVE HEALTH CARE (ICD-V70.0) FAMILY HISTORY DEPRESSION (ICD-V17.0) THYROID CYST (ICD-246.2)  LUPUS ERYTHEMATOSUS (ICD-695.4) ENDOMETRIOSIS OF OTHER SPECIFIED SITES (ICD-617.8) GERD (ICD-530.81) HERNIATED LUMBAR DISC (ICD-722.10)   Anxiety Nephrolithiasis, hx of Pancreatitis, hx of  Past Surgical History: Reviewed history from 02/22/2009 and no changes required. laporoscopy---x 3 thyroid cyst----Dr Spree--  Appendectomy [id number]---1 miscarriages  Cholecystectomy  Family History: Reviewed history from 07/13/2007 and no changes required. Family History Depression-- M with bipolar Family History High cholesterol Family History Hypertension   allergies: mother, sister asthma: mother rheumatism: maternal grandmother  Social History: Reviewed history from 11/29/2007 and no changes required. Occupation: not working because of Lupus Married Never Smoked Alcohol use-no Drug use-no Regular exercise-yes   Review of Systems      See HPI  Physical Exam  General:  Well-developed,well-nourished,in no acute distress; alert,appropriate and cooperative throughout examination Psych:  Oriented X3, good eye contact, subdued, and slightly anxious.     Impression & Recommendations:  Problem # 1:  FIBROMYALGIA (ICD-729.1)  Her updated medication list for this problem includes:    Percocet 5-325 Mg Tabs (Oxycodone-acetaminophen) .Marland Kitchen... 1 by mouth every 6 hours as needed  Problem # 2:  LUPUS ERYTHEMATOSUS (ICD-695.4) diagnosis not definite pt  to [facility] rheum at [facility] Her updated medication list for this problem includes:    Plaquenil 200 Mg Tabs (Hydroxychloroquine sulfate) .Marland Kitchen... 1 by mouth once daily  Complete Medication List: 1)  Levoxyl 50 Mcg Tabs (Levothyroxine sodium) .... Take 1 tab once  daily 2)  Ambien 10 Mg Tabs (Zolpidem tartrate) .Marland Kitchen.. 1 by mouth every at bedtime as needed insomnia 3)  Prevacid 15 Mg Cpdr (Lansoprazole) .... Take 1 capsule by mouth once a day 4)  Klonopin 0.5 Mg Tabs (Clonazepam) .Marland Kitchen.. 1 by mouth two times a day 5)  Lexapro 10 Mg Tabs (Escitalopram oxalate) .Marland Kitchen.. 1 by mouth once daily 6)  Yaz 3-0.02 Mg Tabs (Drospirenone-ethinyl estradiol) .... By mouth once daily 7)  Cheratussin Ac 100-10 Mg/74ml Syrp (Guaifenesin-codeine) .Marland Kitchen.. 1-2 tsp by mouth at bedtime as needed 8)  Percocet 5-325 Mg Tabs (Oxycodone-acetaminophen) .Marland Kitchen.. 1 by mouth every 6 hours as needed 9)  Vitamin D (ergocalciferol) 50000 Unit Caps (Ergocalciferol) .Marland Kitchen.. 1 by mouth weekly 10)  Plaquenil 200 Mg Tabs (Hydroxychloroquine sulfate) .Marland Kitchen.. 1 by mouth once daily Prescriptions: PLAQUENIL 200 MG TABS (HYDROXYCHLOROQUINE SULFATE) 1 by mouth once daily  #302 x 0   Entered and Authorized by:   Loreen Freud DO   Signed by:   Loreen Freud DO on 02/20/2010   Method used:   Print then Give to Patient   RxID:   [id number]    Orders Added: 1)  Est. Patient Level III [56213]

## 2010-05-13 NOTE — Progress Notes (Signed)
 Summary: Medications   Phone Note Call from Patient   Summary of Call: Pt left voicemiail saying that she dropped almost all of her amoxcillin in the toliet this am, she only has 7 pills left. Can we send in another rx?  Initial call taken by: Edsel Fletcher CMA,  January 01, 2009 12:00 PM  Follow-up for Phone Call        Please call pharmacy and explain situation and call in more for her---thanks Follow-up by: Jamee Shanks DO,  January 01, 2009 12:12 PM  Additional Follow-up for Phone Call Additional follow up Details #1::        rx called in.  Additional Follow-up by: Edsel Fletcher CMA,  January 01, 2009 2:18 PM    Prescriptions: AUGMENTIN  875-125 MG TABS (AMOXICILLIN -POT CLAVULANATE) 1 by mouth two times a day  #15 x 0   Entered and Authorized by:   Edsel Fletcher CMA   Signed by:   Edsel Fletcher CMA on 01/01/2009   Method used:   Telephoned to ...       [facility] [address] [phone]* (retail)       [address]       [place], [place]  [address]       Ph: [phone] or [phone]       Fax: [phone]   RxID:   [id number]

## 2010-05-13 NOTE — Assessment & Plan Note (Signed)
Summary: FOR COUGH//PH   Vital Signs:  Patient profile:   35 year old female Height:      60 inches Weight:      179.0 pounds BMI:     35.08 O2 Sat:      99 % on Room air Temp:     97.6 degrees F oral Pulse rate:   98 / minute Pulse rhythm:   regular BP sitting:   112 / 74  (left arm) Cuff size:   large  Vitals Entered By: Almeta Monas CMA Duncan Dull) (January 16, 2010 3:08 PM)  O2 Flow:  Room air CC: c/o cough x1wk, Cough   History of Present Illness:  Cough      This is a 35 year old woman who presents with Cough.  The symptoms began 1 week ago.  The patient reports productive cough and shortness of breath, but denies non-productive cough, pleuritic chest pain, wheezing, exertional dyspnea, fever, hemoptysis, and malaise.  The patient denies the following symptoms: cold/URI symptoms, sore throat, nasal congestion, chronic rhinitis, weight loss, acid reflux symptoms, and peripheral edema.  The cough is worse with lying down.  Ineffective prior treatments have included OTC cough medication.    Current Medications (verified): 1)  Levoxyl 50 Mcg Tabs (Levothyroxine Sodium) .... Take 1 Tab Once Daily 2)  Ambien 10 Mg Tabs (Zolpidem Tartrate) .Marland Kitchen.. 1 By Mouth Every At Bedtime As Needed Insomnia 3)  Prevacid 15 Mg Cpdr (Lansoprazole) .... Take 1 Capsule By Mouth Once A Day 4)  Klonopin 0.5 Mg Tabs (Clonazepam) .Marland Kitchen.. 1 By Mouth Two Times A Day 5)  Lexapro 10 Mg Tabs (Escitalopram Oxalate) .Marland Kitchen.. 1 By Mouth Once Daily 6)  Yaz 3-0.02 Mg Tabs (Drospirenone-Ethinyl Estradiol) .... By Mouth Once Daily 7)  Zithromax Z-Pak 250 Mg Tabs (Azithromycin) .... As Directed 8)  Cheratussin Ac 100-10 Mg/61ml Syrp (Guaifenesin-Codeine) .Marland Kitchen.. 1-2 Tsp By Mouth At Bedtime As Needed  Allergies (verified): 1)  ! Prednisone 2)  ! Vicodin 3)  ! Compazine  Past History:  Past medical, surgical, family and social histories (including risk factors) reviewed for relevance to current acute and chronic  problems.  Past Medical History: Reviewed history from 04/16/2008 and no changes required. GERD endometriosis----Kaplan Lupus back pain Current Problems:  PLANTAR FASCIITIS, BILATERAL (ICD-728.71) ABDOMINAL PAIN OTHER SPECIFIED SITE (ICD-789.09) BACK PAIN (ICD-724.5) ABDOMINAL BLOATING (ICD-787.3) SNORING, HX OF (ICD-V15.89) PREVENTIVE HEALTH CARE (ICD-V70.0) FAMILY HISTORY DEPRESSION (ICD-V17.0) THYROID CYST (ICD-246.2)  LUPUS ERYTHEMATOSUS (ICD-695.4) ENDOMETRIOSIS OF OTHER SPECIFIED SITES (ICD-617.8) GERD (ICD-530.81) HERNIATED LUMBAR DISC (ICD-722.10)   Anxiety Nephrolithiasis, hx of Pancreatitis, hx of  Past Surgical History: Reviewed history from 02/22/2009 and no changes required. laporoscopy---x 3 thyroid cyst----Dr Spree--  Appendectomy [id number]---4 miscarriages  Cholecystectomy  Family History: Reviewed history from 07/13/2007 and no changes required. Family History Depression-- M with bipolar Family History High cholesterol Family History Hypertension   allergies: mother, sister asthma: mother rheumatism: maternal grandmother  Social History: Reviewed history from 11/29/2007 and no changes required. Occupation: not working because of Lupus Married Never Smoked Alcohol use-no Drug use-no Regular exercise-yes   Review of Systems      See HPI  Physical Exam  General:  Well-developed,well-nourished,in no acute distress; alert,appropriate and cooperative throughout examination Neck:  No deformities, masses, or tenderness noted. Lungs:  rhonci scattered normal respiratory effort.   Heart:  Normal rate and regular rhythm. S1 and S2 normal without gallop, murmur, click, rub or other extra sounds. Skin:  Intact without suspicious lesions or rashes  Psych:  Cognition and judgment appear intact. Alert and cooperative with normal attention span and concentration. No apparent delusions, illusions, hallucinations   Impression &  Recommendations:  Problem # 1:  BRONCHITIS- ACUTE (ICD-466.0)  Her updated medication list for this problem includes:    Zithromax Z-pak 250 Mg Tabs (Azithromycin) .Marland Kitchen... As directed    Cheratussin Ac 100-10 Mg/71ml Syrp (Guaifenesin-codeine) .Marland Kitchen... 1-2 tsp by mouth at bedtime as needed  Take antibiotics and other medications as directed. Encouraged to push clear liquids, get enough rest, and take acetaminophen as needed. To be seen in 5-7 days if no improvement, sooner if worse.  Complete Medication List: 1)  Levoxyl 50 Mcg Tabs (Levothyroxine sodium) .... Take 1 tab once daily 2)  Ambien 10 Mg Tabs (Zolpidem tartrate) .Marland Kitchen.. 1 by mouth every at bedtime as needed insomnia 3)  Prevacid 15 Mg Cpdr (Lansoprazole) .... Take 1 capsule by mouth once a day 4)  Klonopin 0.5 Mg Tabs (Clonazepam) .Marland Kitchen.. 1 by mouth two times a day 5)  Lexapro 10 Mg Tabs (Escitalopram oxalate) .Marland Kitchen.. 1 by mouth once daily 6)  Yaz 3-0.02 Mg Tabs (Drospirenone-ethinyl estradiol) .... By mouth once daily 7)  Zithromax Z-pak 250 Mg Tabs (Azithromycin) .... As directed 8)  Cheratussin Ac 100-10 Mg/64ml Syrp (Guaifenesin-codeine) .Marland Kitchen.. 1-2 tsp by mouth at bedtime as needed  Patient Instructions: 1)  Acute Bronchitis symptoms for less then 10 days are not  helped by antibiotics. Take over the counter cough medications. Call if no improvement in 5-7 days, sooner if increasing cough, fever, or new symptoms ( shortness of breath, chest pain) .  Prescriptions: CHERATUSSIN AC 100-10 MG/5ML SYRP (GUAIFENESIN-CODEINE) 1-2 tsp by mouth at bedtime as needed  #6 oz x 0   Entered and Authorized by:   Loreen Freud DO   Signed by:   Loreen Freud DO on 01/16/2010   Method used:   Print then Give to Patient   RxID:   [id number] ZITHROMAX Z-PAK 250 MG TABS (AZITHROMYCIN) as directed  #1 x 0   Entered and Authorized by:   Loreen Freud DO   Signed by:   Loreen Freud DO on 01/16/2010   Method used:   [id number]       [address]       [place], [place]  [address]       Ph: [phone] or [phone]       Fax: [phone]   RxID:   [id number]

## 2010-05-13 NOTE — Letter (Signed)
 Summary: ELAM Consult Scheduled Letter  [facility] [place]  [address] [place], [place] [address]   Phone: [phone]  Fax: [phone]      05/30/2007 MRN: [id number]  [facility] SENIC [address] [place], [place]  [address]    Dear [name],      We have scheduled an appointment for you.  At the recommendation of Dr.Lowne, we have scheduled you a consult with Dr. Corrie -- [facility] [address] on 02.25.09 at 3:15 p.m.  Their phone number is [phone].  If this appointment day and time is not convenient for you, please feel free to call the office of the doctor you are being referred to at the number listed above and reschedule the appointment.  It is important for you to keep your scheduled appointments.  We are here to make sure you are given good patient care.  If you have questions or you have made changes to your appointment, please notify us  at [phone], ask for [facility].   Thank you,  Patient Care Coordinator [place] at [facility]

## 2010-05-13 NOTE — Assessment & Plan Note (Signed)
 Summary: for a lump on her head//ph   Vital Signs:  Patient Profile:   35 Years Old Female Height:     60 inches Weight:      189.8 pounds Temp:     98.5 degrees F oral Pulse rate:   72 / minute BP sitting:   106 / 80  (left arm)  Pt. in pain?   no  Vitals Entered By: Jackson Garner CMA (February 16, 2008 3:49 PM)                  Referred by:  Jamee Shanks PCP:  Shanks  Chief Complaint:  lump on rt side of head causing headaches, memory loss, and ringing in ears.  History of Present Illness: Pt here c/o lump on R side of head and she has ringing in the right ear and memory loss.   Pt also c/o anxiety and increased stress with panic attacks.        Current Allergies (reviewed today): ! PREDNISONE ! VICODIN  Past Medical History:    Reviewed history from 11/29/2007 and no changes required:       GERD       endometriosis----Kaplan       Lupus       back pain       Current Problems:        PLANTAR FASCIITIS, BILATERAL (ICD-728.71)       ABDOMINAL PAIN OTHER SPECIFIED SITE (ICD-789.09)       BACK PAIN (ICD-724.5)       ABDOMINAL BLOATING (ICD-787.3)       SNORING, HX OF (ICD-V15.89)       PREVENTIVE HEALTH CARE (ICD-V70.0)       FAMILY HISTORY DEPRESSION (ICD-V17.0)       THYROID  CYST (ICD-246.2)        LUPUS ERYTHEMATOSUS (ICD-695.4)       ENDOMETRIOSIS OF OTHER SPECIFIED SITES (ICD-617.8)       GERD (ICD-530.81)       HERNIATED LUMBAR DISC (ICD-722.10)               Anxiety  Past Surgical History:    Reviewed history from 11/29/2007 and no changes required:       laporoscopy---x 3       thyroid  cyst----Dr Spree-- [facility]       Appendectomy       [id number]---6 miscarriages    Family History:    Reviewed history from 07/13/2007 and no changes required:       Family History Depression-- M with bipolar       Family History High cholesterol       Family History Hypertension                     allergies: mother, sister       asthma: mother  rheumatism: maternal grandmother  Social History:    Reviewed history from 11/29/2007 and no changes required:       Occupation: not working because of Lupus       Married       Never Smoked       Alcohol use-no       Drug use-no       Regular exercise-yes    Risk Factors: Tobacco use:  never Passive smoke exposure:  no Drug use:  no HIV high-risk behavior:  no Alcohol use:  no Exercise:  yes    Times per week:  3    Type:  walking Seatbelt use:  100 %  Family History Risk Factors:    Family History of MI in females < 64 years old:  no    Family History of MI in males < 76 years old:  no  PAP Smear History:    Date of Last PAP Smear:  03/24/2006   Review of Systems      See HPI   Physical Exam  General:     Well-developed,well-nourished,in no acute distress; alert,appropriate and cooperative throughout examination Eyes:     vision grossly intact, pupils equal, pupils round, pupils reactive to light, and no injection.   Ears:     External ear exam shows no significant lesions or deformities.  Otoscopic examination reveals clear canals, tympanic membranes are intact bilaterally without bulging, retraction, inflammation or discharge. Hearing is grossly normal bilaterally. Nose:     External nasal examination shows no deformity or inflammation. Nasal mucosa are pink and moist without lesions or exudates. Neck:     No deformities, masses, or tenderness noted. Lungs:     Normal respiratory effort, chest expands symmetrically. Lungs are clear to auscultation, no crackles or wheezes. Heart:     normal rate, regular rhythm, and no murmur.   Msk:     normal ROM, no joint tenderness, no joint swelling, no joint warmth, no redness over joints, no joint deformities, no joint instability, and no crepitation.   Extremities:     No clubbing, cyanosis, edema, or deformity noted with normal full range of motion of all joints.   Neurologic:     alert & oriented X3, cranial nerves  II-XII intact, strength normal in all extremities, sensation intact to light touch, sensation intact to pinprick, gait normal, and DTRs symmetrical and normal.   Skin:     Intact without suspicious lesions or rashes Cervical Nodes:     No lymphadenopathy noted Psych:     Cognition and judgment appear intact. Alert and cooperative with normal attention span and concentration. No apparent delusions, illusions, hallucinations    Impression & Recommendations:  Problem # 1:  ANXIETY (ICD-300.00) rto 3-4 weeks Her updated medication list for this problem includes:    Cymbalta 60 Mg Cpep (Duloxetine hcl) .SABRA... 1 by mouth once daily    Ativan  0.5 Mg Tabs (Lorazepam ) .SABRA... 1 by mouth three times a day as needed Discussed medication use and relaxation techniques.   Problem # 2:  THYROID  NODULE (ICD-241.0) per endo  Complete Medication List: 1)  Ambien  10 Mg Tabs (Zolpidem  tartrate) .... Take one tablet at bedtime 2)  Zanaflex 2 Mg Caps (Tizanidine hcl) .... As needed 3)  Nexium 40 Mg Cpdr (Esomeprazole magnesium) .... Take 1 tablet by mouth once a day 4)  Levoxyl 50 Mcg Tabs (Levothyroxine sodium) .... Take 1 tab once daily 5)  Prevacid 15 Mg Cpdr (Lansoprazole) .SABRA.. 1 by mouth once daily 6)  Cymbalta 60 Mg Cpep (Duloxetine hcl) .SABRA.. 1 by mouth once daily 7)  Ativan  0.5 Mg Tabs (Lorazepam ) .SABRA.. 1 by mouth three times a day as needed    Prescriptions: ATIVAN  0.5 MG TABS (LORAZEPAM ) 1 by mouth three times a day as needed  #30 x 0   Entered and Authorized by:   Jamee Shanks DO   Signed by:   Jamee Shanks DO on 02/16/2008   Method used:   Print then Give to Patient   RxID:   [id number] CYMBALTA 60 MG CPEP (DULOXETINE HCL) 1 by mouth once daily  #30 x 0  Entered and Authorized by:   Jamee Shanks DO   Signed by:   Jamee Shanks DO on 02/16/2008   Method used:   Historical   RxID:   [id number]  ]

## 2010-05-13 NOTE — Progress Notes (Signed)
 Summary: ativan  not called in  Phone Note Call from Patient Call back at [facility] Phone [phone]   Caller: Patient Summary of Call: PT CALLED TO SAY [facility]  [facility]OSED TO BE CALLED IN 07/27/08.  [facility] AND SPOKE WITH PHARMACIST CALLED RX IN AGAIN FROM 07/27/08. SPOKE WITH PT INFORMED.Lonney Gosling  August 01, 2008 3:51 PM  Initial call taken by: Lonney Gosling,  August 01, 2008 3:51 PM

## 2010-05-13 NOTE — Progress Notes (Signed)
 Summary: concerns  Phone Note Call from Patient Call back at Home Phone [phone]   Caller: Patient Call For: Jamee Shanks DO Summary of Call: pt called and left voicemail stating that she is still in a lot of pain and is now having really bad anxiety attacks. she wants to know if she can have a referral for these problems, or if there is something you can do for her.  Initial call taken by: Edsel Fletcher CMA,  December 24, 2008 4:11 PM  Follow-up for Phone Call        I still think she should call they [facility] at [facility] and see them again.   Also give her the number for [facility]) Does she have any ativan  left ? Did it help--we can call some more of that in if it did.   Follow-up by: Jamee Shanks DO,  December 24, 2008 5:03 PM  Additional Follow-up for Phone Call Additional follow up Details #1::        LMTCB.  Additional Follow-up by: Edsel Fletcher CMA,  December 25, 2008 8:28 AM    Additional Follow-up for Phone Call Additional follow up Details #2::    pt states that she cannot go back to [facility], conflict with doctor and family members.  pt states that the ativan  does not work very well.  Follow-up by: Edsel Fletcher CMA,  December 25, 2008 9:56 AM  Additional Follow-up for Phone Call Additional follow up Details #3:: Details for Additional Follow-up Action Taken: she need OV---30 min  pt has appt on thursday. Edsel Fletcher CMA  December 25, 2008 4:34 PM Additional Follow-up by: Jamee Shanks DO,  December 25, 2008 10:17 AM

## 2010-05-13 NOTE — Consult Note (Signed)
Summary: [facility]  [facility]   Imported By: Lanelle Bal 06/28/2009 09:29:52  _____________________________________________________________________  External Attachment:    Type:   Image     Comment:   External Document

## 2010-05-13 NOTE — Progress Notes (Signed)
 Summary: Medication concerns   Phone Note Call from Patient Call back at [facility] Phone [phone]   Summary of Call: pt cannot afford the augmentin . Her insurance will not cover it. any advice on what to do?  Initial call taken by: Edsel Fletcher CMA,  January 01, 2009 3:03 PM  Follow-up for Phone Call        ceftin 500 1 by mouth two times a day for 10 days   Follow-up by: Jamee Shanks DO,  January 01, 2009 3:27 PM  Additional Follow-up for Phone Call Additional follow up Details #1::        medication called into pharmacy, pt notified.  Additional Follow-up by: Edsel Fletcher CMA,  January 01, 2009 4:57 PM    New/Updated Medications: CEFTIN 500 MG TABS (CEFUROXIME AXETIL) 1 by mouth two times a day for 10 days. Prescriptions: CEFTIN 500 MG TABS (CEFUROXIME AXETIL) 1 by mouth two times a day for 10 days.  #20 x 0   Entered by:   Edsel Fletcher CMA   Authorized by:   Jamee Shanks DO   Signed by:   Edsel Fletcher CMA on 01/01/2009   Method used:   [facility]        [facility] [address] [phone]retail[facility]       [address]       [place], [place]  [address]       Ph: [phone] or [phone]       Fax: [phone]   RxID:   [id number]

## 2010-05-13 NOTE — Letter (Signed)
Summary: Generic Letter  [place] [facility] [place]  [address] [place], [place] [address]   Phone: [phone]  Fax: [phone]    02/20/2010  RE: Donna Haas   DOB 18-Oct-1975 [address] [place], [place]  [address]  Dear Dr Christen Bame,  Lestine Box for participating in the care of [name].  I received your letter in March recommending a trial of Plaquenil.  She recently called our office requesting this.  However, given the uncertainty of the diagnosis of Lupus,  I do not feel comfortable prescribing it and would appreciate your help in this matter.  Please feel free to call with any questions.               Sincerely,   Loreen Freud DO

## 2010-05-13 NOTE — Progress Notes (Signed)
 Summary: refill [facility]- dr antonio  Phone Note Call from Patient Call back at [facility] Phone [phone]   Caller: Patient Summary of Call: patient wants rx called in for ambien  10mg  - [facility] a [facility] [address]   --- she said when [facility] sometimes it takes a week to get it Initial call taken by: Niels Spring,  December 02, 2007 11:36 AM  Follow-up for Phone Call        last ov 11/29/07 last refill #30 11/08/07 .Donna Haas  December 02, 2007 1:01 PM  Follow-up by: Donna Haas,  December 02, 2007 1:01 PM  Additional Follow-up for Phone Call Additional follow up Details #1::        OK to refill x1 but I prefer she does not take it everyday---- to easy to get addicted to Additional Follow-up by: Jamee Antonio DO,  December 02, 2007 3:58 PM      Prescriptions: AMBIEN  10 MG  TABS (ZOLPIDEM  TARTRATE) take one tablet at bedtime  #30 x 0   Entered by:   Donna Haas   Authorized by:   Jamee Antonio DO   Signed by:   Donna Haas on 12/02/2007   Method used:   Printed then faxed to ...       [facility] [address] [phone][facility] (retail)       [address]       [place], [place]  [address]       Ph: [phone] or [phone]       Fax: [phone]   RxID:   [id number]

## 2010-05-13 NOTE — Letter (Signed)
 Summary: Primary Care Consult Scheduled Letter  [place] [facility] [place]  [address] [place], [place] [address]   Phone: [phone]  Fax: [phone]      08/16/2007 MRN: [id number]  [facility] SEN[facility] [address] [place], [place]  [address]    Dear [name],      We have scheduled an appointment for you.  At the recommendation of Dr.LOWNE, we have scheduled you a consult with DR LAMAR KISS [facility] on 05.26.09 @ 9:00.  Their phone number is [phone].  If this appointment day and time is not convenient for you, please feel free to call the office of the doctor you are being referred to at the number listed above and reschedule the appointment.     It is important for you to keep your scheduled appointments. We are here to make sure you are given good patient care. If you have questions or you have made changes to your appointment, please notify us  at  [phone].    Thank you,  Patient Care Coordinator [place] at [facility]

## 2010-05-13 NOTE — Assessment & Plan Note (Signed)
 Summary: acute - sore throat/congested.cbs   Vital Signs:  Patient Profile:   35 Years Old Female Height:     60 inches Weight:      185 pounds Temp:     98.9 degrees F oral Pulse rate:   72 / minute Resp:     18 per minute BP sitting:   120 / 80  (right arm)  Pt. in pain?   no  Vitals Entered By: Rosaline Elliot (February 21, 2008 10:30 AM)                  Referred by:  Jamee Shanks PCP:  Shanks  Chief Complaint:  sore throat, congestion, left ear puncture, and sinus drainage.SABRA  History of Present Illness: Pt here with her husband and daughter.  She c/o earpain and congestion for several days.      Current Allergies: ! PREDNISONE ! VICODIN  Past Medical History:    Reviewed history from 02/16/2008 and no changes required:       GERD       endometriosis----Kaplan       Lupus       back pain       Current Problems:        PLANTAR FASCIITIS, BILATERAL (ICD-728.71)       ABDOMINAL PAIN OTHER SPECIFIED SITE (ICD-789.09)       BACK PAIN (ICD-724.5)       ABDOMINAL BLOATING (ICD-787.3)       SNORING, HX OF (ICD-V15.89)       PREVENTIVE HEALTH CARE (ICD-V70.0)       FAMILY HISTORY DEPRESSION (ICD-V17.0)       THYROID  CYST (ICD-246.2)        LUPUS ERYTHEMATOSUS (ICD-695.4)       ENDOMETRIOSIS OF OTHER SPECIFIED SITES (ICD-617.8)       GERD (ICD-530.81)       HERNIATED LUMBAR DISC (ICD-722.10)               Anxiety  Past Surgical History:    Reviewed history from 11/29/2007 and no changes required:       laporoscopy---x 3       thyroid  cyst----Dr Spree-- [place]       Appendectomy       [id number]---6 miscarriages    Family History:    Reviewed history from 07/13/2007 and no changes required:       Family History Depression-- M with bipolar       Family History High cholesterol       Family History Hypertension                     allergies: mother, sister       asthma: mother       rheumatism: maternal grandmother  Social History:    Reviewed  history from 11/29/2007 and no changes required:       Occupation: not working because of Lupus       Married       Never Smoked       Alcohol use-no       Drug use-no       Regular exercise-yes    Risk Factors: Tobacco use:  never Passive smoke exposure:  no Drug use:  no HIV high-risk behavior:  no Alcohol use:  no Exercise:  yes    Times per week:  3    Type:  walking Seatbelt use:  100 %  Family History Risk Factors:  Family History of MI in females < 86 years old:  no    Family History of MI in males < 63 years old:  no  PAP Smear History:    Date of Last PAP Smear:  03/24/2006   Review of Systems      See HPI   Physical Exam  General:     Well-developed,well-nourished,in no acute distress; alert,appropriate and cooperative throughout examination Ears:     R tm--perforation and errythema Nose:     no external deformity, mucosal erythema, and mucosal edema.   Mouth:     Oral mucosa and oropharynx without lesions or exudates.  Teeth in good repair. Neck:     No deformities, masses, or tenderness noted. Lungs:     Normal respiratory effort, chest expands symmetrically. Lungs are clear to auscultation, no crackles or wheezes. Heart:     normal rate, regular rhythm, and no murmur.   Skin:     Intact without suspicious lesions or rashes    Impression & Recommendations:  Problem # 1:  ROM (ICD-382.9)  Her updated medication list for this problem includes:    Augmentin  875-125 Mg Tabs (Amoxicillin -pot clavulanate) .SABRA... 1 by mouth two times a day Floxin gtts  ENT if no better Instructed on prevention and treatment. Call if no improvement in 48-72 hours or sooner if worsening symptoms.   Complete Medication List: 1)  Ambien  10 Mg Tabs (Zolpidem  tartrate) .... Take one tablet at bedtime 2)  Zanaflex 2 Mg Caps (Tizanidine hcl) .... As needed 3)  Prilosec 40 Mg Cpdr (Omeprazole) .... Take one tablet daily. 4)  Levoxyl 50 Mcg Tabs (Levothyroxine sodium)  .... Take 1 tab once daily 5)  Prevacid 15 Mg Cpdr (Lansoprazole) .SABRA.. 1 by mouth once daily 6)  Cymbalta 60 Mg Cpep (Duloxetine hcl) .SABRA.. 1 by mouth once daily 7)  Ativan  0.5 Mg Tabs (Lorazepam ) .SABRA.. 1 by mouth three times a day as needed 8)  Augmentin  875-125 Mg Tabs (Amoxicillin -pot clavulanate) .SABRA.. 1 by mouth two times a day 9)  Floxin Otic 0.3 % Soln (Ofloxacin) .SABRA.. 10 gtts r ear once daily for 10 days    Prescriptions: FLOXIN OTIC 0.3 % SOLN (OFLOXACIN) 10 gtts R ear once daily for 10 days  #10 days x 0   Entered and Authorized by:   Jamee Shanks DO   Signed by:   Jamee Shanks DO on 02/21/2008   Method used:   [facility]        [facility] [address] [phone][facility] (retail)       [address]       [place], [place]  [address]       Ph: [phone] or [phone]       Fax: [phone]   RxID:   [id number] AUGMENTIN  875-125 MG TABS (AMOXICILLIN -POT CLAVULANATE) 1 by mouth two times a day  #20 x 0   Entered and Authorized by:   Jamee Shanks DO   Signed by:   Jamee Shanks DO on 02/21/2008   Method used:   [facility]        [facility] [address] [phone]* (retail)       [address]       [place], [place]  [address]       Ph: [phone] or [phone]       Fax: [phone]   RxID:   [id number]  ]

## 2010-05-13 NOTE — Letter (Signed)
 Summary: [facility]   Imported By: Tilton Dinsmore 07/15/2007 10:31:14  _____________________________________________________________________  External Attachment:    Type:   Image     Comment:   External Document

## 2010-05-13 NOTE — Progress Notes (Signed)
 Summary: no better  Phone Note Call from Patient   Summary of Call: pt states that she still has sore throat, sinus pain,aches.pt states that she finished the antibiotic and sympotms went away then came.Birdena Deloach CMA  May 21, 2008 9:09 AM  Follow-up for Phone Call        avelox abc 400mg   #10  1po once daily for 10 days---ov if no better Follow-up by: Jamee Shanks DO,  May 21, 2008 9:22 AM  Additional Follow-up for Phone Call Additional follow up Details #1::        pt aware rx sent.Felecia Deloach CMA  May 21, 2008 9:58 AM    New/Updated Medications: AVELOX ABC PACK 400 MG TABS (MOXIFLOXACIN HCL) take 1 po once daily for 10 days   Prescriptions: AVELOX ABC PACK 400 MG TABS (MOXIFLOXACIN HCL) take 1 po once daily for 10 days  #10 x 0   Entered by:   Jackson Garner CMA   Authorized by:   Jamee Shanks DO   Signed by:   Jackson Garner CMA on 05/21/2008   Method used:   [facility]ically [facility]        [facility] [address] [phone]* (retail)       [address]       [place], [place]  [address]       Ph: [phone] or [phone]       Fax: [phone]   RxID:   [id number]

## 2010-05-13 NOTE — Assessment & Plan Note (Signed)
 Summary: acute only for cold and fever//PH   Vital Signs:  Patient Profile:   35 Years Old Female Height:     60 inches Weight:      184.8 pounds Temp:     98.3 degrees F oral Pulse rate:   80 / minute BP sitting:   110 / 78  (left arm)  Pt. in pain?   no  Vitals Entered By: Jackson Garner CMA (May 08, 2008 12:52 PM)                  Referred by:  Jamee Shanks PCP:  Shanks  Chief Complaint:  fever, cough, ches congestion, stuffy  nose head congestion, chills, bodyaches, and URI symptoms.  History of Present Illness:  URI Symptoms      This is a 35 year old woman who presents with URI symptoms.  The symptoms began duration 1-2 days ago.  The patient reports nasal congestion, purulent nasal discharge, sore throat, and dry cough.  Associated symptoms include fever of 100.5-103 degrees.  The patient denies fever, low-grade fever (<100.5 degrees), fever of 103.1-104 degrees, fever to >104 degrees, stiff neck, dyspnea, wheezing, rash, vomiting, diarrhea, use of an antipyretic, and response to antipyretic.  The patient also reports muscle aches.  The patient denies itchy watery eyes, itchy throat, sneezing, seasonal symptoms, response to antihistamine, headache, and severe fatigue.  The patient denies the following risk factors for Strep sinusitis: unilateral facial pain, unilateral nasal discharge, poor response to decongestant, double sickening, tooth pain, Strep exposure, tender adenopathy, and absence of cough.      Current Allergies (reviewed today): ! PREDNISONE ! VICODIN  Past Medical History:    Reviewed history from 04/16/2008 and no changes required:       GERD       endometriosis----Kaplan       Lupus       back pain       Current Problems:        PLANTAR FASCIITIS, BILATERAL (ICD-728.71)       ABDOMINAL PAIN OTHER SPECIFIED SITE (ICD-789.09)       BACK PAIN (ICD-724.5)       ABDOMINAL BLOATING (ICD-787.3)       SNORING, HX OF (ICD-V15.89)       PREVENTIVE  HEALTH CARE (ICD-V70.0)       FAMILY HISTORY DEPRESSION (ICD-V17.0)       THYROID  CYST (ICD-246.2)        LUPUS ERYTHEMATOSUS (ICD-695.4)       ENDOMETRIOSIS OF OTHER SPECIFIED SITES (ICD-617.8)       GERD (ICD-530.81)       HERNIATED LUMBAR DISC (ICD-722.10)               Anxiety       Nephrolithiasis, hx of       Pancreatitis, hx of  Past Surgical History:    Reviewed history from 11/29/2007 and no changes required:       laporoscopy---x 3       thyroid  cyst----Dr Spree-- [place]       Appendectomy       [id number]---6 miscarriages    Family History:    Reviewed history from 07/13/2007 and no changes required:       Family History Depression-- M with bipolar       Family History High cholesterol       Family History Hypertension  allergies: mother, sister       asthma: mother       rheumatism: maternal grandmother  Social History:    Reviewed history from 11/29/2007 and no changes required:       Occupation: not working because of Lupus       Married       Never Smoked       Alcohol use-no       Drug use-no       Regular exercise-yes    Risk Factors: Tobacco use:  never Passive smoke exposure:  no Drug use:  no HIV high-risk behavior:  no Alcohol use:  no Exercise:  yes    Times per week:  3    Type:  walking Seatbelt use:  100 %  Family History Risk Factors:    Family History of MI in females < 12 years old:  no    Family History of MI in males < 73 years old:  no  PAP Smear History:    Date of Last PAP Smear:  03/24/2006   Review of Systems      See HPI   Physical Exam  General:     alert and well-developed.   Ears:     External ear exam shows no significant lesions or deformities.  Otoscopic examination reveals clear canals, tympanic membranes are intact bilaterally without bulging, retraction, inflammation or discharge. Hearing is grossly normal bilaterally. Nose:     L frontal sinus tenderness, L maxillary sinus  tenderness, R frontal sinus tenderness, and R maxillary sinus tenderness.   Mouth:     pharyngeal erythema and postnasal drip.   Neck:     No deformities, masses, or tenderness noted.cervical lymphadenopathy.   Lungs:     Normal respiratory effort, chest expands symmetrically. Lungs are clear to auscultation, no crackles or wheezes. Heart:     normal rate, regular rhythm, and no murmur.   Extremities:     No clubbing, cyanosis, edema, or deformity noted with normal full range of motion of all joints.   Skin:     Intact without suspicious lesions or rashes Cervical Nodes:     No lymphadenopathy noted Psych:     Cognition and judgment appear intact. Alert and cooperative with normal attention span and concentration. No apparent delusions, illusions, hallucinations    Impression & Recommendations:  Problem # 1:  ACUTE SINUSITIS, UNSPECIFIED (ICD-461.9)  Her updated medication list for this problem includes:    Augmentin  875-125 Mg Tabs (Amoxicillin -pot clavulanate) .SABRA... 1 by mouth two times a day    Veramyst 27.5 Mcg/spray Susp (Fluticasone furoate) .SABRA... 2 sprays each nostril once daily Instructed on treatment. Call if symptoms persist or worsen.  Orders: Rapid Strep (12119)   Complete Medication List: 1)  Ambien  10 Mg Tabs (Zolpidem  tartrate) .... Take one tablet at bedtime 2)  Zanaflex 2 Mg Caps (Tizanidine hcl) .... As needed 3)  Prilosec 40 Mg Cpdr (Omeprazole) .... Take one tablet daily. 4)  Levoxyl 50 Mcg Tabs (Levothyroxine sodium) .... Take 1 tab once daily 5)  Prevacid 15 Mg Cpdr (Lansoprazole) .SABRA.. 1 by mouth once daily 6)  Cymbalta 30 Mg Cpep (Duloxetine hcl) .... Take one tablet daily. 7)  Ativan  0.5 Mg Tabs (Lorazepam ) .SABRA.. 1 by mouth three times a day as needed 8)  Augmentin  875-125 Mg Tabs (Amoxicillin -pot clavulanate) .SABRA.. 1 by mouth two times a day 9)  Veramyst 27.5 Mcg/spray Susp (Fluticasone furoate) .... 2 sprays each nostril once daily 10)  Fluconazole   150 Mg Tabs (Fluconazole ) .SABRA.. 1 by mouth once daily x1, may repeat in 1 week as needed    Prescriptions: FLUCONAZOLE  150 MG TABS (FLUCONAZOLE ) 1 by mouth once daily x1, May repeat in 1 week as needed  #2 x 2   Entered and Authorized by:   Jamee Shanks DO   Signed by:   Jamee Shanks DO on 05/08/2008   Method used:   [facility] to        [facility] [address] [phone]* (retail)       [address]       [place], [place]  [address]       Ph: [phone] or [phone]       Fax: [phone]   RxID:   [id number] AUGMENTIN  875-125 MG TABS (AMOXICILLIN -POT CLAVULANATE) 1 by mouth two times a day  #20 x 0   Entered and Authorized by:   Jamee Shanks DO   Signed by:   Jamee Shanks DO on 05/08/2008   Method used:   Electronically to        [facility] [address] [phone]* (retail)       [address]       [place], [place]  [address]       Ph: [phone] or [phone]       Fax: [phone]   RxID:   [id number]   Laboratory Results  Date/Time Received: May 08, 2008 1:22 PM  Date/Time Reported: May 08, 2008 1:25 PM   Other Tests  Rapid Strep: negative

## 2010-05-13 NOTE — Letter (Signed)
 Summary: [facility]   Imported By: Tilton Dinsmore 07/12/2007 15:49:31  _____________________________________________________________________  External Attachment:    Type:   Image     Comment:   External Document

## 2010-05-13 NOTE — Assessment & Plan Note (Signed)
 Summary: flu shot/kdc   Nurse Visit   Allergies: 1)  ! Prednisone 2)  ! Vicodin

## 2010-05-13 NOTE — Progress Notes (Signed)
Summary: med not strong enough  Phone Note Refill Request   Refills Requested: Medication #1:  PERCOCET 5-325 MG TABS 1 by mouth every 6 hours as needed Pt states that med is not strong enough and is wearing off to quick. Pt would like to have med increased. last filled 01-27-10 #60 last OV 01-16-10 acute. Pls advise..............Marland KitchenFelecia Deloach CMA  February 17, 2010 9:41 AM    Follow-up for Phone Call        Is she taking the rx vita D and otc vita D?   Did she call rheum about starting plaquenil because I don't feel comfortable starting it--they would have to if they really want her on it. I really don not want to increase dose percocet---- if rheum does not want to start plaquenil that we will consider pain management Follow-up by: Loreen Freud DO,  February 17, 2010 11:07 AM  Additional Follow-up for Phone Call Additional follow up Details #1::        Discuss with patient. Pt has not been able to get in touch with rheum will continue to try to get in touch with them and discuss further at OV ...............Marland KitchenFelecia Deloach CMA  February 17, 2010 3:02 PM

## 2010-05-13 NOTE — Progress Notes (Signed)
 Summary: kidney stone FYI FYI  Phone Note Call from Patient   Caller: Patient Summary of Call: pt called and stated that she was seen in the ed last week for kidney stones and want to let you know. pt was told by ed to inform primary doctor.  Initial call taken by: Jackson Garner CMA,  April 16, 2008 4:18 PM  Follow-up for Phone Call        dr lowne pls advise Follow-up by: Jackson Garner CMA,  April 16, 2008 4:21 PM  Additional Follow-up for Phone Call Additional follow up Details #1::        Is the pain better?  Did she pass it?  Which Er?  so we can get records. Additional Follow-up by: Jamee Shanks DO,  April 16, 2008 4:24 PM  New Problems: PANCREATITIS, HX OF (ICD-V12.70) NEPHROLITHIASIS, HX OF (ICD-V13.01)   Additional Follow-up for Phone Call Additional follow up Details #2::    pt states that she pass it at the ER and has only had only one instance since  then. pt states that she feels fine she does not need any pain med or anything just wanted to let you know what was going on. pt was seen at  [facility] Follow-up by: [facility] Ed CMA,  April 17, 2008 3:23 PM  New Problems: PANCREATITIS, HX OF (ICD-V12.70) NEPHROLITHIASIS, HX OF (ICD-V13.01)     Past Medical History:    GERD    endometriosis----Kaplan    Lupus    back pain    Current Problems:     PLANTAR FASCIITIS, BILATERAL (ICD-728.71)    ABDOMINAL PAIN OTHER SPECIFIED SITE (ICD-789.09)    BACK PAIN (ICD-724.5)    ABDOMINAL BLOATING (ICD-787.3)    SNORING, HX OF (ICD-V15.89)    PREVENTIVE HEALTH CARE (ICD-V70.0)    FAMILY HISTORY DEPRESSION (ICD-V17.0)    THYROID  CYST (ICD-246.2)     LUPUS ERYTHEMATOSUS (ICD-695.4)    ENDOMETRIOSIS OF OTHER SPECIFIED SITES (ICD-617.8)    GERD (ICD-530.81)    HERNIATED LUMBAR DISC (ICD-722.10)         Anxiety    Nephrolithiasis, hx of    Pancreatitis, hx of

## 2010-05-13 NOTE — Assessment & Plan Note (Signed)
 Summary: FOR NECK AND BACK PAIN--PH   Vital Signs:  Patient Profile:   35 Years Old Female Height:     60 inches Weight:      187.6 pounds Temp:     97.9 degrees F oral Pulse rate:   80 / minute BP sitting:   120 / 82  (left arm)  Pt. in pain?   no  Vitals Entered By: Jackson Garner CMA (December 13, 2007 4:06 PM)                  Referred by:  Jamee Shanks PCP:  Shanks  Chief Complaint:  neck and back pain, black out 3 days ago, and nausea from pain med.  History of Present Illness: Pt here c/o neck pain after playing with daughter---  she turned funny and then felt pop in neck-- at base of neck and has numbness in arms b/l---  few days later felt nauseous and dizzy in car and then about 30 minutes after she got home she passed out --  her husband witnessed this and says she was out for 10 second---  Pt was in [facility] on Saturday.  CT head neg no contrast.   Pt given zofran ,  vicodin ES.  Vicodin makes her sick and does nothing for pain.  Pain is making her very sick to her stomach.    Current Allergies: ! PREDNISONE ! VICODIN  Past Medical History:    Reviewed history from 11/29/2007 and no changes required:       GERD       endometriosis----Kaplan       Lupus       back pain       Current Problems:        PLANTAR FASCIITIS, BILATERAL (ICD-728.71)       ABDOMINAL PAIN OTHER SPECIFIED SITE (ICD-789.09)       BACK PAIN (ICD-724.5)       ABDOMINAL BLOATING (ICD-787.3)       SNORING, HX OF (ICD-V15.89)       PREVENTIVE HEALTH CARE (ICD-V70.0)       FAMILY HISTORY DEPRESSION (ICD-V17.0)       THYROID  CYST (ICD-246.2)        LUPUS ERYTHEMATOSUS (ICD-695.4)       ENDOMETRIOSIS OF OTHER SPECIFIED SITES (ICD-617.8)       GERD (ICD-530.81)       HERNIATED LUMBAR DISC (ICD-722.10)          Past Surgical History:    Reviewed history from 11/29/2007 and no changes required:       laporoscopy---x 3       thyroid  cyst----Dr Spree-- [place]       Appendectomy  [id number]---6 miscarriages    Family History:    Reviewed history from 07/13/2007 and no changes required:       Family History Depression-- M with bipolar       Family History High cholesterol       Family History Hypertension                     allergies: mother, sister       asthma: mother       rheumatism: maternal grandmother  Social History:    Reviewed history from 11/29/2007 and no changes required:       Occupation: not working because of Lupus       Married       Never Smoked  Alcohol use-no       Drug use-no       Regular exercise-yes    Risk Factors: Tobacco use:  never Passive smoke exposure:  no Drug use:  no HIV high-risk behavior:  no Alcohol use:  no Exercise:  yes    Times per week:  3    Type:  walking Seatbelt use:  100 %  Family History Risk Factors:    Family History of MI in females < 51 years old:  no    Family History of MI in males < 24 years old:  no  PAP Smear History:    Date of Last PAP Smear:  03/24/2006   Review of Systems      See HPI   Physical Exam  General:     alert, well-developed, and moderate distress.   Eyes:     vision grossly intact, pupils equal, and pupils round.   Neck:     supple.  Pain with turning-- no errythema or swelling noted Msk:     + weakness L arm no pain with palpation of neck no swelling Extremities:     No clubbing, cyanosis, edema, or deformity noted with normal full range of motion of all joints.   Neurologic:     alert & oriented X3 and LUE weakness.   Psych:     Oriented X3, memory intact for recent and remote, normally interactive, and good eye contact.      Impression & Recommendations:  Problem # 1:  NECK PAIN, ACUTE (ICD-723.1)  The following medications were removed from the medication list:    Darvocet-n 100 100-650 Mg Tabs (Propoxyphene n-apap) .SABRA... Take 1-2 tablets every 4-6 hours as needed  Her updated medication list for this problem includes:    Zanaflex 2 Mg Caps  (Tizanidine hcl) .SABRA... As needed   dilaudad 4mg    Orders: T-Cervical Spine Comp 4 Views [phone]) Radiology Referral (Radiology)  mri Discussed exercises and use of moist heat or cold and medication.  Orders: T-Cervical Spine Comp 4 Views [phone]) Radiology Referral (Radiology) Glucose, (CBG) [phone]) Hgb (14981)   Complete Medication List: 1)  Ambien  10 Mg Tabs (Zolpidem  tartrate) .... Take one tablet at bedtime 2)  Zanaflex 2 Mg Caps (Tizanidine hcl) .... As needed 3)  Nexium 40 Mg Cpdr (Esomeprazole magnesium) .... Take 1 tablet by mouth once a day 4)  Dilaudid  4mg   .... 1 by mouth every 4 hours as needed    Prescriptions: ZANAFLEX 2 MG  CAPS (TIZANIDINE HCL) as needed  #60 x 2   Entered and Authorized by:   Jamee Shanks DO   Signed by:   Jamee Shanks DO on 12/13/2007   Method used:   Faxed to ...       [facility] [address] [phone]* (retail)       [address]       [place], [place]  [address]       Ph: [phone] or [phone]       Fax: [phone]   RxID:   [id number] DILAUDID   4MG  1 by mouth every 4 hours as needed  #30 x 0   Entered and Authorized by:   Jamee Shanks DO   Signed by:   Jamee Shanks DO on 12/13/2007   Method used:   Print then Give to Patient   RxID:   [id number]  ] Laboratory Results   Blood Tests  Glucose (random): 133 mg/dL   (Normal Range: 29-894)  CBC HGB:  11.1 g/dL   (Normal Range: 86.9-82.9 in Males, 12.0-15.0 in Females)

## 2010-05-13 NOTE — Assessment & Plan Note (Signed)
 Summary: ROA/drb   Vital Signs:  Patient profile:   35 year old female Weight:      183.38 pounds Temp:     98.4 degrees F oral Pulse rate:   74 / minute Pulse rhythm:   regular BP sitting:   104 / 80  (left arm) Cuff size:   large  Vitals Entered By: Edsel Fletcher CMA (December 31, 2008 2:04 PM) CC: Not feeling any better, possible head cold also ran a fever on saturday. , URI symptoms   History of Present Illness:       This is a 35 year old woman who presents with URI symptoms.  The symptoms began 3 days ago.  The patient complains of clear nasal discharge, purulent nasal discharge, and productive cough, but denies nasal congestion, sore throat, dry cough, earache, and sick contacts.  Associated symptoms include low-grade fever (<100.5 degrees).  The patient denies fever, fever of 100.5-103 degrees, fever of 103.1-104 degrees, fever to >104 degrees, stiff neck, dyspnea, wheezing, rash, vomiting, diarrhea, use of an antipyretic, and response to antipyretic.  The patient denies itchy watery eyes, itchy throat, sneezing, seasonal symptoms, response to antihistamine, headache, muscle aches, and severe fatigue.  The patient denies the following risk factors for Strep sinusitis: unilateral facial pain, unilateral nasal discharge, poor response to decongestant, double sickening, tooth pain, Strep exposure, tender adenopathy, and absence of cough.   Pt also c/o anxiety being uncontrollled.   She is having more anxiety/ panic attacks.  She was on Lexapro  when she was pregnant and did well.     Current Medications (verified): 1)  Ambien  10 Mg  Tabs (Zolpidem  Tartrate) .... Take One Tablet At Bedtime 2)  Levoxyl 50 Mcg Tabs (Levothyroxine Sodium) .... Take 1 Tab Once Daily 3)  Prevacid 15 Mg Cpdr (Lansoprazole) .SABRA.. 1 By Mouth Once Daily 4)  Ativan  0.5 Mg Tabs (Lorazepam ) .SABRA.. 1 By Mouth Three Times A Day As Needed 5)  Darvocet-N 100 100-650 Mg Tabs (Propoxyphene N-Apap) .... Take  1 By Mouth  Every 6 Hours As Needed 6)  Lexapro  10 Mg Tabs (Escitalopram  Oxalate) .SABRA.. 1 By Mouth Once Daily 7)  Augmentin  875-125 Mg Tabs (Amoxicillin -Pot Clavulanate) .SABRA.. 1 By Mouth Two Times A Day  Allergies: 1)  ! Prednisone 2)  ! Vicodin  Past History:  Past Medical History: Last updated: 04/16/2008 GERD endometriosis----Kaplan Lupus back pain Current Problems:  PLANTAR FASCIITIS, BILATERAL (ICD-728.71) ABDOMINAL PAIN OTHER SPECIFIED SITE (ICD-789.09) BACK PAIN (ICD-724.5) ABDOMINAL BLOATING (ICD-787.3) SNORING, HX OF (ICD-V15.89) PREVENTIVE HEALTH CARE (ICD-V70.0) FAMILY HISTORY DEPRESSION (ICD-V17.0) THYROID  CYST (ICD-246.2)  LUPUS ERYTHEMATOSUS (ICD-695.4) ENDOMETRIOSIS OF OTHER SPECIFIED SITES (ICD-617.8) GERD (ICD-530.81) HERNIATED LUMBAR DISC (ICD-722.10)   Anxiety Nephrolithiasis, hx of Pancreatitis, hx of  Past Surgical History: Last updated: 11/29/2007 laporoscopy---x 3 thyroid  cyst----Dr Gearldine-- [place] Appendectomy [id number]---6 miscarriages   Family History: Last updated: 07/13/2007 Family History Depression-- M with bipolar Family History High cholesterol Family History Hypertension   allergies: mother, sister asthma: mother rheumatism: maternal grandmother  Social History: Last updated: 11/29/2007 Occupation: not working because of Lupus Married Never Smoked Alcohol use-no Drug use-no Regular exercise-yes   Risk Factors: Exercise: yes (05/30/2007)  Risk Factors: Smoking Status: never (05/30/2007) Passive Smoke Exposure: no (05/30/2007)  Review of Systems      See HPI  Physical Exam  General:  Well-developed,well-nourished,in no acute distress; alert,appropriate and cooperative throughout examination Ears:  External ear exam shows no significant lesions or deformities.  Otoscopic examination reveals clear canals, tympanic membranes  are intact bilaterally without bulging, retraction, inflammation or discharge. Hearing is grossly normal  bilaterally. Nose:  External nasal examination shows no deformity or inflammation. Nasal mucosa are pink and moist without lesions or exudates. Mouth:  Oral mucosa and oropharynx without lesions or exudates.  Teeth in good repair. Neck:  No deformities, masses, or tenderness noted. Lungs:  Normal respiratory effort, chest expands symmetrically. Lungs are clear to auscultation, no crackles or wheezes. Heart:  Normal rate and regular rhythm. S1 and S2 normal without gallop, murmur, click, rub or other extra sounds. Extremities:  No clubbing, cyanosis, edema, or deformity noted with normal full range of motion of all joints.   Skin:  Intact without suspicious lesions or rashes Cervical Nodes:  No lymphadenopathy noted Psych:  Cognition and judgment appear intact. Alert and cooperative with normal attention span and concentration. No apparent delusions, illusions, hallucinations   Impression & Recommendations:  Problem # 1:  ACUTE SINUSITIS, UNSPECIFIED (ICD-461.9)  Her updated medication list for this problem includes:    Augmentin  875-125 Mg Tabs (Amoxicillin -pot clavulanate) .SABRA... 1 by mouth two times a day  Problem # 2:  LUPUS ERYTHEMATOSUS (ICD-695.4)  Orders: Rheumatology Referral (Rheumatology)  Problem # 3:  ANXIETY (ICD-300.00) Pt given numbers for psych Her updated medication list for this problem includes:    Ativan  0.5 Mg Tabs (Lorazepam ) .SABRA... 1 by mouth three times a day as needed    Lexapro  10 Mg Tabs (Escitalopram  oxalate) .SABRA... 1 by mouth once daily  Discussed medication use and relaxation techniques.   Complete Medication List: 1)  Ambien  10 Mg Tabs (Zolpidem  tartrate) .... Take one tablet at bedtime 2)  Levoxyl 50 Mcg Tabs (Levothyroxine sodium) .... Take 1 tab once daily 3)  Prevacid 15 Mg Cpdr (Lansoprazole) .SABRA.. 1 by mouth once daily 4)  Ativan  0.5 Mg Tabs (Lorazepam ) .SABRA.. 1 by mouth three times a day as needed 5)  Darvocet-n 100 100-650 Mg Tabs (Propoxyphene  n-apap) .... Take  1 by mouth every 6 hours as needed 6)  Lexapro  10 Mg Tabs (Escitalopram  oxalate) .SABRA.. 1 by mouth once daily 7)  Augmentin  875-125 Mg Tabs (Amoxicillin -pot clavulanate) .SABRA.. 1 by mouth two times a day Prescriptions: AUGMENTIN  875-125 MG TABS (AMOXICILLIN -POT CLAVULANATE) 1 by mouth two times a day  #20 x 0   Entered and Authorized by:   Jamee Shanks DO   Signed by:   Jamee Shanks DO on 12/31/2008   Method used:   [facility]        [facility] [address] [phone]* (retail)       [address]       [place], [place]  [address]       Ph: [phone] or [phone]       Fax: [phone]   RxID:   [id number] LEXAPRO  10 MG TABS (ESCITALOPRAM  OXALATE) 1 by mouth once daily  #302 x 2   Entered and Authorized by:   Jamee Shanks DO   Signed by:   Jamee Shanks DO on 12/31/2008   Method used:   Electronically to        [facility] [address] [phone]* (retail)       [address]       [place], [place]  [address]       Ph: [phone] or [phone]       Fax: [phone]   RxID:   [id number]

## 2010-05-13 NOTE — Progress Notes (Signed)
" °----   Converted from flag ---- ---- 06/06/2007 2:05 PM, Jamee Shanks DO wrote: Thanks for trying  ---- 06/06/2007 12:55 PM, Bernarda Mace wrote: Pt called back and stated that she had no one to watch her daughter so she is going to keep her appt for wednesday.  ---- 06/06/2007 12:21 PM, Jamee Shanks DO wrote: Bernarda---  Please try to get US  abd today or tomorrow am---- Thankyou---see phone note ------------------------------ "

## 2010-05-13 NOTE — Progress Notes (Signed)
Summary: refill  Phone Note Refill Request Call back at Home Phone [phone] Message from:  Patient  Pt states that she is in a lot pain from her fibromyalgia. pt was given percocet in the pass for this but is currently out and would like a refill. [facility]  [address] [facility] [place].. Unable to see when med was rx for pt. pls advise...........Marland KitchenFelecia Deloach CMA  January 27, 2010 8:27 AM    Follow-up for Phone Call        pt vita D level was very low from rheum---  vita D 50000u weekly #4  2 refills and take otc vita D3  2000u daily---- we need to recheck lab-- 3 months  percocet 5/325 printed---remind her that is only to be taken when she needs it and rx is to last at least 1 month---ov 1 month Follow-up by: Loreen Freud DO,  January 27, 2010 10:33 AM  Additional Follow-up for Phone Call Additional follow up Details #1::        pt aware of the above. wanted to start plaquenil, per Dr.Lowne pt to contact Rheumotoligist..Kimberly Suzie Portela CMA (AAMA)  January 27, 2010 12:10 PM     New/Updated Medications: PERCOCET 5-325 MG TABS (OXYCODONE-ACETAMINOPHEN) 1 by mouth every 6 hours as needed VITAMIN D (ERGOCALCIFEROL) 50000 UNIT CAPS (ERGOCALCIFEROL) 1 by mouth weekly Prescriptions: VITAMIN D (ERGOCALCIFEROL) 50000 UNIT CAPS (ERGOCALCIFEROL) 1 by mouth weekly  #4 x 2   Entered by:   Almeta Monas CMA (AAMA)   Authorized by:   Loreen Freud DO   Signed by:   Almeta Monas CMA (AAMA) on 01/27/2010   Method used:   Faxed to ...       [facility] [phone]* (retail)       [address] [place], [place]  [address]       Ph: [phone] or [phone]       Fax: [phone]   RxID:   [id number] PERCOCET 5-325 MG TABS (OXYCODONE-ACETAMINOPHEN) 1 by mouth every 6 hours as needed  #60 x 0   Entered and Authorized by:   Loreen Freud DO   Signed by:   Loreen Freud DO on 01/27/2010   Method used:   Print then Give to Patient   RxID:   [id number]

## 2010-05-13 NOTE — Assessment & Plan Note (Signed)
 Summary: FLU SHOT/KDC   Nurse Visit   Allergies: 1)  ! Prednisone 2)  ! Vicodin  Orders Added: 1)  Admin 1st Vaccine [90471] 2)  Flu Vaccine 38yrs + [09341] Flu Vaccine Consent Questions     Do you have a history of severe allergic reactions to this vaccine? no    Any prior history of allergic reactions to egg and/or gelatin? no    Do you have a sensitivity to the preservative Thimersol? no    Do you have a past history of Guillan-Barre Syndrome? no    Do you currently have an acute febrile illness? no    Have you ever had a severe reaction to latex? no    Vaccine information given and explained to patient? yes    Are you currently pregnant? no    Lot Number:[id number]   Exp Date:10/10/2009   Site Given  Left Deltoid IMu Vaccine 13yrs + [09341] .lbflu

## 2010-05-13 NOTE — Progress Notes (Signed)
 Summary: Ambien  request  Phone Note Refill Request Message from:  Patient on April 08, 2009 11:35 AM  Refills Requested: Medication #1:  AMBIEN  10 MG TABS 1 by mouth at bedtime.   Last Refilled: 03/11/2009   Notes: last ov- 02/22/09- [facility] [address]    Method Requested: Fax to Local Pharmacy  Follow-up for Phone Call        1 q 3rd night as needed #10 Follow-up by: Elsie Roses MD,  April 08, 2009 4:05 PM    New/Updated Medications: AMBIEN  10 MG TABS (ZOLPIDEM  TARTRATE) 1 by mouth every 3rd night as needed. Prescriptions: AMBIEN  10 MG TABS (ZOLPIDEM  TARTRATE) 1 by mouth every 3rd night as needed.  #10 x 0   Entered by:   Edsel Fletcher CMA   Authorized by:   Elsie Roses MD   Signed by:   Edsel Fletcher CMA on 04/08/2009   Method used:   Printed then faxed to ...       [facility]  [address] [id number]ail)       [address]       [place], [place]  [address]       Ph: [phone] or [phone]       Fax: [phone]   RxID:   [id number]

## 2010-05-13 NOTE — Progress Notes (Signed)
 Summary: refer-med change  Phone Note Call from Patient   Caller: Patient Summary of Call: pt called and said that her ear is no better and would like a referral for ENT as discuss during ov. pt also states that the new dose of cymbalta 60 mg is causing her to have hot flashes. pt states that she prefer the 10 mg b/c they work and she did not have any side effect from it. so would like to go back to that if possible Initial call taken by: Jackson Garner CMA,  February 28, 2008 12:06 PM  Follow-up for Phone Call        dr lowne pls advise Follow-up by: Jackson Garner CMA,  February 28, 2008 12:13 PM  Additional Follow-up for Phone Call Additional follow up Details #1::        It 30 mg --not 10----that's fine  30 mg #30  1 by mouth once daily  2 refills referral done Additional Follow-up by: Jamee Shanks DO,  February 28, 2008 12:52 PM    Additional Follow-up for Phone Call Additional follow up Details #2::    Patient aware of referral and of rx being sent to the pharmacy. Rosaline Elliot  February 28, 2008 3:29 PM  Follow-up by: Rosaline Elliot,  February 28, 2008 3:29 PM  New/Updated Medications: CYMBALTA 30 MG CPEP (DULOXETINE HCL) Take one tablet daily.   Prescriptions: CYMBALTA 30 MG CPEP (DULOXETINE HCL) Take one tablet daily.  #30 x 2   Entered by:   Rosaline Elliot   Authorized by:   Jamee Shanks DO   Signed by:   Rosaline Elliot on 02/28/2008   Method used:   Faxed to ...       [facility] [address] [phone]* (retail)       [address]       [place], [place]  [address]       Ph: [phone] or [phone]       Fax: [phone]   RxID:   [id number]

## 2010-05-13 NOTE — Assessment & Plan Note (Signed)
 Summary: stomach issues/kdc   Vital Signs:  Patient profile:   35 year old female Weight:      181.38 pounds Temp:     98.1 degrees F oral Pulse rate:   76 / minute Pulse rhythm:   regular BP sitting:   126 / 82  (left arm) Cuff size:   large  Vitals Entered By: Edsel Fletcher CMA (February 22, 2009 1:32 PM) CC: 2 Episodes of vomitting and diarrhe and stomach swelling lasting 12-13 hours , Abdominal Pain   History of Present Illness:       This is a 35 year old woman who presents with Abdominal Pain.  The symptoms began 2 weeks ago.  The patient reports nausea, vomiting, and diarrhea, but denies constipation, melena, hematochezia, anorexia, and hematemesis.  The location of the pain is epigastric and right upper quadrant.  The pain is described as constant and sharp.  The patient denies the following symptoms: fever, weight loss, dysuria, chest pain, jaundice, dark urine, missed menstrual period, and vaginal bleeding.  The pain is better with antacids, an H2 blocker, and a PPI.   Pt has resolved--she just feels nauseous.    Pt also c/o spot on nose that has been there for 6 years.  It recently started bleeding on occassion.  Preventive Screening-Counseling & Management  Alcohol-Tobacco     Smoking Status: never     Passive Smoke Exposure: no  Caffeine-Diet-Exercise     Does Patient Exercise: yes     Type of exercise: walking     Times/week: 3  Current Medications (verified): 1)  Levoxyl 50 Mcg Tabs (Levothyroxine Sodium) .... Take 1 Tab Once Daily 2)  Dexilant 60 Mg Cpdr (Dexlansoprazole) .SABRA.. 1 By Mouth Once Daily 3)  Ativan  0.5 Mg Tabs (Lorazepam ) .SABRA.. 1 By Mouth Three Times A Day As Needed 4)  Darvocet-N 100 100-650 Mg Tabs (Propoxyphene N-Apap) .... Take  1 By Mouth Every 6 Hours As Needed 5)  Lexapro  10 Mg Tabs (Escitalopram  Oxalate) .SABRA.. 1 By Mouth Once Daily 6)  Ceftin 500 Mg Tabs (Cefuroxime Axetil) .SABRA.. 1 By Mouth Two Times A Day For 10 Days. 7)  Silenor 6 Mg .SABRA.. 1  By Mouth At Bedtime  Allergies: 1)  ! Prednisone 2)  ! Vicodin  Past History:  Past medical, surgical, family and social histories (including risk factors) reviewed for relevance to current acute and chronic problems.  Past Medical History: Reviewed history from 04/16/2008 and no changes required. GERD endometriosis----Kaplan Lupus back pain Current Problems:  PLANTAR FASCIITIS, BILATERAL (ICD-728.71) ABDOMINAL PAIN OTHER SPECIFIED SITE (ICD-789.09) BACK PAIN (ICD-724.5) ABDOMINAL BLOATING (ICD-787.3) SNORING, HX OF (ICD-V15.89) PREVENTIVE HEALTH CARE (ICD-V70.0) FAMILY HISTORY DEPRESSION (ICD-V17.0) THYROID  CYST (ICD-246.2)  LUPUS ERYTHEMATOSUS (ICD-695.4) ENDOMETRIOSIS OF OTHER SPECIFIED SITES (ICD-617.8) GERD (ICD-530.81) HERNIATED LUMBAR DISC (ICD-722.10)   Anxiety Nephrolithiasis, hx of Pancreatitis, hx of  Past Surgical History: laporoscopy---x 3 thyroid  cyst----Dr Spree-- [place] Appendectomy [id number]---6 miscarriages  Cholecystectomy  Family History: Reviewed history from 07/13/2007 and no changes required. Family History Depression-- M with bipolar Family History High cholesterol Family History Hypertension   allergies: mother, sister asthma: mother rheumatism: maternal grandmother  Social History: Reviewed history from 11/29/2007 and no changes required. Occupation: not working because of Lupus Married Never Smoked Alcohol use-no Drug use-no Regular exercise-yes   Review of Systems      See HPI  Physical Exam  General:  Well-developed,well-nourished,in no acute distress; alert,appropriate and cooperative throughout examination Abdomen:  soft, normal bowel sounds, no distention,  no masses, no guarding, no rigidity, no rebound tenderness, epigastric tenderness, and RUQ tenderness.   Skin:  Intact without suspicious lesions or rashes Psych:  Cognition and judgment appear intact. Alert and cooperative with normal attention span and  concentration. No apparent delusions, illusions, hallucinations   Impression & Recommendations:  Problem # 1:  ABDOMINAL PAIN OTHER SPECIFIED SITE (ICD-789.09)  Her updated medication list for this problem includes:    Darvocet-n 100 100-650 Mg Tabs (Propoxyphene n-apap) .SABRA... Take  1 by mouth every 6 hours as needed  Orders: Venipuncture (63584) TLB-BMP (Basic Metabolic Panel-BMET) (80048-METABOL) TLB-CBC Platelet - w/Differential (85025-CBCD) TLB-Hepatic/Liver Function Pnl (80076-HEPATIC) TLB-Amylase (82150-AMYL) TLB-Lipase (83690-LIPASE) Radiology Referral (Radiology)  Discussed use of medications, application of heat or cold, and exercises.   Problem # 2:  GERD (ICD-530.81)  Her updated medication list for this problem includes:    Dexilant 60 Mg Cpdr (Dexlansoprazole) .SABRA... 1 by mouth once daily  Diagnostics Reviewed:  Discussed lifestyle modifications, diet, antacids/medications, and preventive measures. Handout provided.   Problem # 3:  INSOMNIA (ICD-780.52) silenor 6 mg qhs  The following medications were removed from the medication list:    Ambien  10 Mg Tabs (Zolpidem  tartrate) .SABRA... Take one tablet at bedtime  Complete Medication List: 1)  Levoxyl 50 Mcg Tabs (Levothyroxine sodium) .... Take 1 tab once daily 2)  Dexilant 60 Mg Cpdr (Dexlansoprazole) .SABRA.. 1 by mouth once daily 3)  Ativan  0.5 Mg Tabs (Lorazepam ) .SABRA.. 1 by mouth three times a day as needed 4)  Darvocet-n 100 100-650 Mg Tabs (Propoxyphene n-apap) .... Take  1 by mouth every 6 hours as needed 5)  Lexapro  10 Mg Tabs (Escitalopram  oxalate) .SABRA.. 1 by mouth once daily 6)  Ceftin 500 Mg Tabs (Cefuroxime axetil) .SABRA.. 1 by mouth two times a day for 10 days. 7)  Silenor 6 Mg  .SABRASABRA. 1 by mouth at bedtime  Other Orders: Dermatology Referral (Derma) Prescriptions: SILENOR 6 MG 1 by mouth at bedtime  #30 x 0   Entered and Authorized by:   Jamee Shanks DO   Signed by:   Jamee Shanks DO on 02/22/2009   Method  used:   Print then Give to Patient   RxID:   [id number]

## 2010-05-13 NOTE — Letter (Signed)
 Summary: Primary Care Consult Scheduled Letter  [facility] [place]  [address] [place], [place] [address]   Phone: [phone]  Fax: [phone]      06/16/2007 MRN: [id number]  [facility] SENIC [address] [place], [place]  [address]    Dear [name],      We have scheduled an appointment for you.  At the recommendation of Dr.LOWNE, we have scheduled you a consult with [facility] [address] on 96.74.90 @ 8:54 CK IN 1:30.  Their phone number is  [phone].  If this appointment day and time is not convenient for you, please feel free to call the office of the doctor you are being referred to at the number listed above and reschedule the appointment.     It is important for you to keep your scheduled appointments. We are here to make sure you are given good patient care. If yu have questions or you have made changes to your appointment, please notify us  at  [phone], ask for [facility].    Thank you,  Patient Care Coordinator  at [facility]

## 2010-05-13 NOTE — Assessment & Plan Note (Signed)
Summary: Donna Haas, Donna Haas   Vital Signs:  Patient profile:   35 year old female Weight:      183 pounds Temp:     98.2 degrees F oral BP sitting:   122 / 80  (left arm) Cuff size:   large  Vitals Entered By: Army Fossa CMA (May 24, 2009 2:06 PM) CC: Pt has recently left her husband, she found some old klonpin that she has been taking that is helping her sleep. Discuss anxiety. discuss DM. CBG Result 110   History of Present Illness: Pt here with father c/o stress, increased anxiety and panic attacks.  She has been taking klonopin with [place].  Pt left her husband and has been under a lot of stress.     Current Medications (verified): 1)  Levoxyl 50 Mcg Tabs (Levothyroxine Sodium) .... Take 1 Tab Once Daily 2)  Ambien 10 Mg Tabs (Zolpidem Tartrate) .Marland Kitchen.. 1 By Mouth Every At Bedtime As Needed Insomnia 3)  Prevacid 15 Mg Cpdr (Lansoprazole) .... Take 1 Capsule By Mouth Once A Day 4)  Klonopin 0.5 Mg Tabs (Clonazepam) .Marland Kitchen.. 1 By Mouth Two Times A Day 5)  Abilify 10 Mg Tabs (Aripiprazole) .Marland Kitchen.. 1 By Mouth Once Daily 6)  Lexapro 10 Mg Tabs (Escitalopram Oxalate) .Marland Kitchen.. 1 By Mouth Once Daily 7)  Imitrex 100 Mg Tabs (Sumatriptan Succinate) .... As Directed  Allergies: 1)  ! Prednisone 2)  ! Vicodin 3)  ! Compazine  Past History:  Past medical, surgical, family and social histories (including risk factors) reviewed for relevance to current acute and chronic problems.  Past Medical History: Reviewed history from 04/16/2008 and no changes required. GERD endometriosis----Kaplan Lupus back pain Current Problems:  PLANTAR FASCIITIS, BILATERAL (ICD-728.71) ABDOMINAL PAIN OTHER SPECIFIED SITE (ICD-789.09) BACK PAIN (ICD-724.5) ABDOMINAL BLOATING (ICD-787.3) SNORING, HX OF (ICD-V15.89) PREVENTIVE HEALTH CARE (ICD-V70.0) FAMILY HISTORY DEPRESSION (ICD-V17.0) THYROID CYST (ICD-246.2)  LUPUS ERYTHEMATOSUS (ICD-695.4) ENDOMETRIOSIS OF OTHER SPECIFIED SITES  (ICD-617.8) GERD (ICD-530.81) HERNIATED LUMBAR DISC (ICD-722.10)   Anxiety Nephrolithiasis, hx of Pancreatitis, hx of  Past Surgical History: Reviewed history from 02/22/2009 and no changes required. laporoscopy---x 3 thyroid cyst----Dr Spree-- [place] Appendectomy [id number]---4 miscarriages  Cholecystectomy  Family History: Reviewed history from 07/13/2007 and no changes required. Family History Depression-- M with bipolar Family History High cholesterol Family History Hypertension   allergies: mother, sister asthma: mother rheumatism: maternal grandmother  Social History: Reviewed history from 11/29/2007 and no changes required. Occupation: not working because of Lupus Married Never Smoked Alcohol use-no Drug use-no Regular exercise-yes   Review of Systems      See HPI  Physical Exam  General:  Well-developed,well-nourished,in no acute distress; alert,appropriate and cooperative throughout examination Psych:  Oriented X3, memory intact for recent and remote, normally interactive, and good eye contact.  calm today   Impression & Recommendations:  Problem # 1:  ANXIETY (ICD-300.00) pt here for > 20 min with > 50% face to face Her updated medication list for this problem includes:    Klonopin 0.5 Mg Tabs (Clonazepam) .Marland Kitchen... 1 by mouth two times a day    Lexapro 10 Mg Tabs (Escitalopram oxalate) .Marland Kitchen... 1 by mouth once daily  Discussed medication use and relaxation techniques.   Problem # 2:  LUPUS ERYTHEMATOSUS (ICD-695.4) per [facility]  Problem # 3:  ENDOMETRIOSIS OF OTHER SPECIFIED SITES (ICD-617.8)  Problem # 4:  GERD (ICD-530.81)  Her updated medication list for this problem includes:    Prevacid 15 Mg Cpdr (Lansoprazole) .Marland Kitchen... Take  1 capsule by mouth once a day  Problem # 5:  BIPOLAR DISORDER UNSPECIFIED (ICD-296.80) abilify 5 mg for 2 weeks then 10 mg once daily   Problem # 6:  FIBROMYALGIA (ICD-729.1)  Complete Medication List: 1)  Levoxyl 50  Mcg Tabs (Levothyroxine sodium) .... Take 1 tab once daily 2)  Ambien 10 Mg Tabs (Zolpidem tartrate) .Marland Kitchen.. 1 by mouth every at bedtime as needed insomnia 3)  Prevacid 15 Mg Cpdr (Lansoprazole) .... Take 1 capsule by mouth once a day 4)  Klonopin 0.5 Mg Tabs (Clonazepam) .Marland Kitchen.. 1 by mouth two times a day 5)  Abilify 10 Mg Tabs (Aripiprazole) .Marland Kitchen.. 1 by mouth once daily 6)  Lexapro 10 Mg Tabs (Escitalopram oxalate) .Marland Kitchen.. 1 by mouth once daily 7)  Imitrex 100 Mg Tabs (Sumatriptan succinate) .... As directed  Other Orders: Capillary Blood Glucose/CBG (53664)  Patient Instructions: 1)  rto 3-4 weeks Prescriptions: IMITREX 100 MG TABS (SUMATRIPTAN SUCCINATE) as directed  #10 x 0   Entered and Authorized by:   Loreen Freud DO   Signed by:   Loreen Freud DO on 05/24/2009   Method used:   [facility]        [facility] [phone]* (retail)       [address], [place]  [address]       Ph: [phone] or [phone]       Fax: [phone]   RxID:   [id number] LEXAPRO 10 MG TABS (ESCITALOPRAM OXALATE) 1 by mouth once daily  #30 x 5   Entered and Authorized by:   Loreen Freud DO   Signed by:   Loreen Freud DO on 05/24/2009   Method used:   Print then Give to Patient   RxID:   [id number] ABILIFY 10 MG TABS (ARIPIPRAZOLE) 1 by mouth once daily  #30 x 2   Entered and Authorized by:   Loreen Freud DO   Signed by:   Loreen Freud DO on 05/24/2009   Method used:   Print then Give to Patient   RxID:   [id number] KLONOPIN 0.5 MG TABS (CLONAZEPAM) 1 by mouth two times a day  #60 x 2   Entered and Authorized by:   Loreen Freud DO   Signed by:   Loreen Freud DO on 05/24/2009   Method used:   Print then Give to Patient   RxID:   [id number]

## 2010-05-13 NOTE — Progress Notes (Signed)
 Summary: ativan  rx//[facility]  Phone Note Refill Request Message from:  Patient  Refills Requested: Medication #1:  ATIVAN  0.5 MG TABS 1 by mouth three times a day as needed [facility]  Initial call taken by: Lonney Gosling,  July 31, 2008 3:53 PM  Follow-up for Phone Call        Spoke with pt informed this [facility], pt has not checked with pharmacy .Lonney Gosling  July 31, 2008 4:05 PM  Follow-up by: Lonney Gosling,  July 31, 2008 4:05 PM

## 2010-05-13 NOTE — Progress Notes (Signed)
 Summary: gym membership cancellation letter  Phone Note Call from Patient Call back at Home Phone [phone]   Caller: Patient Summary of Call: Patient needs letter to cancel membership at the gym.  When she works out, she passes out, gets lightheaded and SOB.  Patient has not sought medical attention.  I advised her to make an appointment with Dr. Antonio and patient said she'd call back to do so. Initial call taken by: Rosaline Elliot,  July 10, 2008 1:23 PM  Follow-up for Phone Call        PATIENT'S HUSBAND JOSEPH CALLED BACK-PATIENT TO SEE DR.YOO @ 3:15PM TODAY DUE TO ONLY TWO DR.'S IN OUR OFFICE (BOTH SCHEDULED'S FULL) Follow-up by: Pricilla Mosses,  July 10, 2008 2:43 PM

## 2010-05-13 NOTE — Assessment & Plan Note (Signed)
 Summary: swollen eye.cbs   Vital Signs:  Patient Profile:   35 Years Old Female Height:     60 inches Weight:      186.50 pounds BMI:     36.55 Temp:     98.5 degrees F oral Pulse rate:   106 / minute Pulse rhythm:   regular Resp:     18 per minute BP sitting:   130 / 95  (right arm) Cuff size:   large  Vitals Entered By: Monta Croak CMA (November 29, 2007 1:24 PM)                 Referred by:  Jamee Shanks PCP:  Shanks  Chief Complaint:  Left eye sewlling & Pain.  History of Present Illness: c/o sudden onset of left eye swelling and pain this am. She states she has a dull ache in her head.  She denies fever.  She has been using warm compress which is helping. She denies previous occurence.    Current Allergies (reviewed today): ! PREDNISONE  Past Medical History:    GERD    endometriosis----Kaplan    Lupus    back pain    Current Problems:     PLANTAR FASCIITIS, BILATERAL (ICD-728.71)    ABDOMINAL PAIN OTHER SPECIFIED SITE (ICD-789.09)    BACK PAIN (ICD-724.5)    ABDOMINAL BLOATING (ICD-787.3)    SNORING, HX OF (ICD-V15.89)    PREVENTIVE HEALTH CARE (ICD-V70.0)    FAMILY HISTORY DEPRESSION (ICD-V17.0)    THYROID  CYST (ICD-246.2)     LUPUS ERYTHEMATOSUS (ICD-695.4)    ENDOMETRIOSIS OF OTHER SPECIFIED SITES (ICD-617.8)    GERD (ICD-530.81)    HERNIATED LUMBAR DISC (ICD-722.10)       Past Surgical History:    laporoscopy---x 3    thyroid  cyst----Dr Spree-- [place]    Appendectomy    [id number]---6 miscarriages    Social History:    Occupation: not working because of Lupus    Married    Never Smoked    Alcohol use-no    Drug use-no    Regular exercise-yes     Review of Systems      See HPI   Physical Exam  General:     alert, well-developed, and well-nourished.   Head:     normocephalic and atraumatic.   Eyes:     Left upper eye lid swollen and slightly red.  vision grossly intact, pupils equal, pupils round, and pupils reactive to  light.   Ears:     R ear normal and L ear normal.   Mouth:     Oral mucosa and oropharynx without lesions or exudates.  Teeth in good repair. Lungs:     normal respiratory effort and normal breath sounds.   Heart:     normal rate, regular rhythm, and no gallop.      Impression & Recommendations:  Problem # 1:  FOLLICULITIS (ICD-704.8) Pt with left eye lid folliculitis (Stye).  Ceftin 500 mg by mouth two times a day x 1 wk.  Warm compress two times a day.  If symptoms worsen, consider opthalmology referral.  Patient advised to call office if symptoms persist or worsen.   Complete Medication List: 1)  Ambien  10 Mg Tabs (Zolpidem  tartrate) .... Take one tablet at bedtime 2)  Darvocet-n 100 100-650 Mg Tabs (Propoxyphene n-apap) .... Take 1-2 tablets every 4-6 hours as needed 3)  Zanaflex 2 Mg Caps (Tizanidine hcl) .... As needed 4)  Nexium 40 Mg Cpdr (Esomeprazole magnesium) .SABRASABRASABRA  Take 1 tablet by mouth once a day 5)  Ceftin 500 Mg Tabs (Cefuroxime axetil) .... One by mouth bid    Prescriptions: CEFTIN 500 MG  TABS (CEFUROXIME AXETIL) one by mouth bid  #14 x 0   Entered and Authorized by:   D. Lamar Robins DO   Signed by:   D. Lamar Robins DO on 11/29/2007   Method used:   Electronically [facility] to ...       [facility] [address] [phone]*       [address]       [place], [place]  [address]       Ph: [phone] or [phone]       Fax: [phone]   RxID:   [id number]  ] Current Allergies (reviewed today): ! PREDNISONE

## 2010-05-13 NOTE — Progress Notes (Signed)
 Summary: FYI  Phone Note Call from Patient   Caller: Spouse Call For: josh Summary of Call: Dr.Lowne   Call Back  [phone]  Pt's husband Donna Haas called and said he wanted to speak to the nurse. he said it was about the conversation they had yesterday. Initial call taken by: Vanessa Jordan,  December 16, 2007 11:39 AM  Follow-up for Phone Call        Husband stated:took Donna Haas to ED last night and pain is a little bit better, still nauseated. Extremely reluctant to go to the ED again because of her past experiences. Donna Haas  December 16, 2007 12:09 PM  Follow-up by: Donna Haas,  December 16, 2007 12:09 PM

## 2010-05-13 NOTE — Letter (Signed)
Summary: [facility] Rheumatology & Clinical Immunology  [facility] Rheumatology & Clinical Immunology   Imported By: Lanelle Bal 07/08/2009 10:15:59  _____________________________________________________________________  External Attachment:    Type:   Image     Comment:   External Document

## 2010-05-13 NOTE — Progress Notes (Signed)
" °  Phone Note Outgoing Call   Summary of Call: Faxed to [facility] per Dr. Antonio, lab work. Donna Haas  November 14, 2007 5:44 PM        "

## 2010-05-13 NOTE — Progress Notes (Signed)
 " Phone Note Outgoing Call   Call placed by: Donna Haas CMA,  December 15, 2007 4:53 PM Call placed to: Patient Summary of Call: Normal mri----  which neurologist is her appointment with?  We can try to get it sooner.  pt aware of mri results.   Initial call taken by: Donna Haas CMA,  December 15, 2007 4:54 PM  Follow-up for Phone Call        husband wants to know what the next step is. wife went to ed. ed set her up with neuro in oct. per pt wanted to be seen sooner. per dr antonio pcc will try to get earlier appt.  Additional Follow-up for Phone Call Additional follow up Details #1::        Called [facility] neuro they would be able to see the patient 9.28.09 dr. jenel... but if dr. antonio could speak with the on call doctor dr. channie they could possible be able to see the patient sooner. Donna Haas informed the patient of this information and she didnt want to wait until the end of the month. Donna Haas spoke with Dr. Antonio and was able to get the patient an appointment with [facility] Neuro. 9.18.09. When Donna Haas  informed the patient of this information she was okay with the appointment but wanted to know what she should do for the pain until then. also she passed out yesterday and was not able to take care of her child. Donna Haas started to cry due to not wanting to wait the two weeks.     Additional Follow-up for Phone Call Additional follow up Details #2::    per dr. antonio recommend the ed due to patient passing out.  Donna Haas informed the patient of this information.    Husband called back and spoke with Donna Haas and wanted to know what his wife would do for the pain and about their child until the 18th of september. After speaking with dr. antonio riggs called the patient....Donna Haas did not go to the ED she took another pain pill instead. Donna Haas informed Donna Haas that since she was passing out that dr. antonio wanted her to go to the ed. She said that she didnt like the way she was  treated at [facility] at her last visit. Donna Haas asked the patient if she could speak with her husband due to Donna Haas being on her pain medication. Patient agreed for her to speak with her husband. Donna Haas spoke with mr. senic and informed him that patient needed to go to 9:00pm. He was going to speak with her dad and his manger about him leaving work early. dr. antonio recommended [facility] ED. After informing the husband of this he said that he didnt want to go their due to the wait. So Donna Haas  recommended [facility] ED.   Additional Follow-up for Phone Call Additional follow up Details #3:: Details for Additional Follow-up Action Taken: dr antonio advise pt that she could possible increase med she is taking now  or go to the ed. 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 And pt is still experiencing the same symptom with the med. Dr antonio advise the pt that if the med are not working and she is still nausea that the only other option is to go to the ed because she can not increase the med any more then what she has. pt was advise to go the the ed and get injection  to help manage the pain better and if the pain is severe enough there might be a neuro on call that she could see possibly. pt  husband is aware of dr antonio advise and has informed his wife. husband  said that they wiill go later when he gets off work to the ed. Additional Follow-up by: Donna Haas CMA,  December 15, 2007 5:29 PM   "

## 2010-05-13 NOTE — Progress Notes (Signed)
" °  Phone Note Outgoing Call Call back at [phone]   Call placed by: [facility] CMA,  January 09, 2008 4:50 PM Call placed to: Patient Summary of Call: dr antonio pls advise.  pt saw chiropractice and he said she had a bad curve in the spine to left and will treat her for that and that he could see her thyroid  gland on the xray she did. and  it was enlarge and that we might need to retest her thyroid  because if that may be causing some problems Initial call taken by: Jackson Garner CMA,  January 09, 2008 4:54 PM  Follow-up for Phone Call        Just because Thyroid  function normal doesn't mean thyroid  is normal---  we should get a thyroid  US . Follow-up by: Jamee Antonio DO,  January 09, 2008 5:06 PM  Additional Follow-up for Phone Call Additional follow up Details #1::        Patient aware of us  referral. Rosaline Elliot  January 10, 2008 5:45 PM  Additional Follow-up by: Rosaline Elliot,  January 10, 2008 5:45 PM     "

## 2010-05-13 NOTE — Assessment & Plan Note (Signed)
 Summary: consult for hypersomnia   Referred by:  Jamee Shanks PCP:  Shanks  Chief Complaint:  Sleep Consult.  History of Present Illness: patient is a 35 year old female who I have been asked to see for obstructive sleep apnea.  The patient states that she has been told she is a loud snorer, but no one has mentioned pauses in her breathing during sleep.  The patient states her husband wears a CPAP machine currently.  She will typically go to bed at 2 a.m., and stays up with her husband who works second shift.  She will get up between 10 and 1030 to start her day, but is not rested upon arising.  Her husband has noted teaching during the night, and the patient does give a history that is suspicious for restless legs in the evening before going to sleep.  The patient notes significant sleep pressure during the day with periods of inactivity.  However, she has no difficulty staying awake watching television in the evening, and no issues with driving.  Of note, her weight is up 30 pounds over the last two years.     Current Allergies: ! PREDNISONE  Past Medical History:    GERD    endometriosis----Kaplan    Lupus    back pain    Current Problems:     PLANTAR FASCIITIS, BILATERAL (ICD-728.71)    ABDOMINAL PAIN OTHER SPECIFIED SITE (ICD-789.09)    BACK PAIN (ICD-724.5)    ABDOMINAL BLOATING (ICD-787.3)    SNORING, HX OF (ICD-V15.89)    PREVENTIVE HEALTH CARE (ICD-V70.0)    FAMILY HISTORY DEPRESSION (ICD-V17.0)    THYROID  CYST (ICD-246.2)    LUPUS ERYTHEMATOSUS (ICD-695.4)    ENDOMETRIOSIS OF OTHER SPECIFIED SITES (ICD-617.8)    GERD (ICD-530.81)    HERNIATED LUMBAR DISC (ICD-722.10)       Past Surgical History:    Reviewed history from 05/30/2007 and no changes required:       laporoscopy---x 3       thyroid  cyst----Dr Spree-- [place]       Appendectomy       [id number]---6 miscarriages   Family History:    Reviewed history from 05/30/2007 and no changes required:       Family  History Depression-- M with bipolar       Family History High cholesterol       Family History Hypertension                     allergies: mother, sister       asthma: mother       rheumatism: maternal grandmother  Social History:    Reviewed history from 05/30/2007 and no changes required:       Occupation: not working because of Lupus       Married       Never Smoked       Alcohol use-no       Drug use-no       Regular exercise-yes   Risk Factors: Tobacco use:  never Passive smoke exposure:  no Drug use:  no HIV high-risk behavior:  no Alcohol use:  no Exercise:  yes    Times per week:  3    Type:  walking Seatbelt use:  100 %  Family History Risk Factors:    Family History of MI in females < 76 years old:  no    Family History of MI in males < 59 years old:  no  PAP Smear History:  Date of Last PAP Smear:  03/24/2006   Review of Systems      See HPI   Vital Signs:  Patient Profile:   35 Years Old Female Height:     60 inches Weight:      199.38 pounds O2 Sat:      98 % O2 treatment:    Room Air Temp:     98.1 degrees F oral Pulse rate:   88 / minute BP sitting:   110 / 82  (left arm) Cuff size:   regular  Vitals Entered By: Duwaine Silos LPN (July 12, 7988 10:17 AM)             Is Patient Diabetic? No Comments Medications reviewed with patient  ..................................................................SABRADuwaine Silos LPN  July 12, 7988 10:18 AM      Physical Exam  General:     an overweight female in no acute distress Eyes:     PERRLA and EOMI.   Nose:     deviated septum to the left with narrowing Mouth:     mild elongation of the soft palate and uvula Neck:     no JVD, thyromegaly, or lymphadenopathy. Lungs:     clear to auscultation Heart:     regular rate and rhythm, no MRG Abdomen:     soft and nontender, bowel sounds present Extremities:     mild right foot edema, pulses intact distally Neurologic:     alert and  oriented, moves all 4 extremities.     Impression & Recommendations:  Problem # 1:  HYPERSOMNIA (ICD-780.54) the patient gives a significant history for daytime sleepiness, and based on her history this could be due to sleep apnea or possibly the restless leg syndrome.  She is overweight, has abnormal upper airway anatomy, and is known to be a loud snorer.  This certainly puts her at risk for having sleep apnea.  At this point, I think she needs to have nocturnal polysomnography to try and characterize her specific sleep issue.  The patient is agreeable to this approach.  Medications Added to Medication List This Visit: 1)  Levsin 0.125 Mg Tabs (Hyoscyamine sulfate) .... Take by mouth as needed   Patient Instructions: 1)  will schedule for sleep study 2)  will call for f/u once study results available    ]

## 2010-05-13 NOTE — Assessment & Plan Note (Signed)
 Summary: ov to discuss sleep study results/mg   Referred by:  Jamee Shanks PCP:  Shanks  Chief Complaint:  Ov to discuss sleep study results. .  History of Present Illness: the patient comes in today for followup of her recent sleep study. She was found to have an AHI of 5 events per hour, and a RDI of 9 events per hour. There was mild desaturation to 84%. There was no evidence for significant leg jerks. I've gone of the study in great detail with the patient, and have answered all of her questions.     Current Allergies: ! PREDNISONE      Vital Signs:  Patient Profile:   35 Years Old Female Height:     60 inches Weight:      190.38 pounds O2 Sat:      98 % O2 treatment:    Room Air Temp:     98.3 degrees F oral Pulse rate:   63 / minute BP sitting:   120 / 76  (left arm) Cuff size:   regular  Vitals Entered By: Duwaine Silos LPN (October 03, 2007 9:40 AM)             Comments Medications reviewed with patient Duwaine Silos LPN  October 03, 2007 9:40 AM      Physical Exam  General:     overweight female in no acute distress     Impression & Recommendations:  Problem # 1:  OBSTRUCTIVE SLEEP APNEA (ICD-327.23) the patient has mild obstructive sleep apnea, however feels it is possibly impacting her quality of life. I told her if the degree of sleep apnea and doesn't always correlate with patient's symptoms, and therefore it is possible this remains a significant quality of life issue. I have discussed the various treatment options of weight loss alone, upper airway surgery, oral appliance, and finally CPAP. I really think a trial of CPAP is in her best interest, since this is the easiest treatment to start initially and will allow us  to see if she has significant improvement in her sense of well-being. If she does have significant improvement, she can look at some of the other options as well. Again, I have encouraged her to work aggressively on weight loss.  Medications  Added to Medication List This Visit: 1)  Nexium 40 Mg Cpdr (Esomeprazole magnesium) .... Take 1 tablet by mouth once a day   Patient Instructions: 1)  will set you up for cpap 2)  f/u with me in 4 weeks   ]

## 2010-05-13 NOTE — Progress Notes (Signed)
" °  Phone Note Call from Patient Call back at [phone]   Caller: Spouse Summary of Call: wife noticed whelp on back of head about 1 hour ago No trauma so they are perplexed about the etiology slight burning there and in nearby ear location is similar to swollen gland that daughter had--about 1 year ago  No fever Not particularly sensitive Not really sick now  P: needs to be evaluated but doesn't sound like an emergency    recommended calling in the morning for appt to check this out Initial call taken by: Charlie Richmond Denise MD,  February 14, 2008 9:50 PM     "

## 2010-05-13 NOTE — Progress Notes (Signed)
 Summary: [facility] 02/15/08  Phone Note Outgoing Call Call back at Home Phone [phone]   Call placed by: Rosaline Elliot,  February 15, 2008 8:18 AM Call placed to: Patient Summary of Call: Caller: Spouse Summary of Call: wife noticed whelp on back of head about 1 hour ago No trauma so they are perplexed about the etiology slight burning there and in nearby ear location is similar to swollen gland that daughter had--about 1 year ago Left message for patient or spouse to call the office Rosaline Elliot  February 15, 2008 8:19 AM   Follow-up for Phone Call        pt came in to office today  Follow-up by: [facility] CMA,  February 16, 2008 4:54 PM

## 2010-05-13 NOTE — Progress Notes (Signed)
" °  Phone Note Call from Patient   Caller: Patient Summary of Call: Needs prescription refill.  Dr. Tish had called in ambien  today, written one tab every three nights. She says that she needs it nightly and does not want to pick up the presciption the way it is written. Looked back at old office notes and in [place], Dr. Antonio did write for nightly ambien . Called phamarcy and change to nightly dosing, 30 tabs with no refills. Initial call taken by: Holmes Cheshire MD,  April 08, 2009 5:23 PM    New/Updated Medications: AMBIEN  10 MG TABS (ZOLPIDEM  TARTRATE) 1 by mouth every at bedtime as needed insomnia Prescriptions: AMBIEN  10 MG TABS (ZOLPIDEM  TARTRATE) 1 by mouth every at bedtime as needed insomnia  #30 x 0   Entered and Authorized by:   Jaleah Lefevre MD   Signed by:   Joaopedro Eschbach MD on 04/08/2009   Method used:   [facility]phoned [facility] ...       [facility]  [address] [id number]       [address]       [place], [place]  [address]       Ph: [phone] or [phone]       Fax: [phone]   RxID:   [id number]  "

## 2010-05-13 NOTE — Consult Note (Signed)
Summary: [facility] Rheumatology & Clinical Immunology  [facility] Rheumatology & Clinical Immunology   Imported By: Lanelle Bal 07/08/2009 10:13:07  _____________________________________________________________________  External Attachment:    Type:   Image     Comment:   External Document

## 2010-05-13 NOTE — Progress Notes (Signed)
Summary: Reaction to meds  Phone Note Call from Patient   Caller: Patient Call For: Loreen Freud DO Details for Reason: Reaction to Abx Summary of Call: call from patient who stated she has a yeast infection from the Abx and wanted to know if she can get [facility] [address] in [place]. C/B [phone]... Please advise. Initial call taken by: Almeta Monas CMA Duncan Dull),  March 19, 2010 5:03 PM  Follow-up for Phone Call        diflucan 150  #2  1 by mouth once daily x1, may repeat in 3 days as needed   Follow-up by: Loreen Freud DO,  March 20, 2010 8:53 AM  Additional Follow-up for Phone Call Additional follow up Details #1::        Left pt detail message rx sent to pharmacy and to call with any concerns...............Marland KitchenFelecia Deloach CMA  March 20, 2010 9:05 AM     New/Updated Medications: DIFLUCAN 150 MG TABS (FLUCONAZOLE) Take 1 by mouth once daily x1, may repeat in 3 days as needed Prescriptions: DIFLUCAN 150 MG TABS (FLUCONAZOLE) Take 1 by mouth once daily x1, may repeat in 3 days as needed  #2 x 0   Entered by:   Jeremy Johann CMA   Authorized by:   Loreen Freud DO   Signed by:   Jeremy Johann CMA on 03/20/2010   Method used:   Faxed to ...       [facility]  [address] [phone][facility] (retail)       [address] [place], [place]  [address]       Ph: [phone] or [phone]       Fax: [phone]   RxID:   [id number]

## 2010-05-13 NOTE — Assessment & Plan Note (Signed)
 Summary: PAIN ALLO OVER /TIRED/KDC   Vital Signs:  Patient profile:   35 year old female Height:      60 inches Weight:      183 pounds Temp:     97.3 degrees F oral Pulse rate:   72 / minute BP sitting:   100 / 68  (left arm)  Vitals Entered By: Jackson Garner CMA (December 14, 2008 1:29 PM) CC: nausea,vomitting, pain in chest on left side x1 day   History of Present Illness: Pt here with pain all over.  Pt states it feels like her last lupus flare.    Current Medications (verified): 1)  Ambien  10 Mg  Tabs (Zolpidem  Tartrate) .... Take One Tablet At Bedtime 2)  Levoxyl 50 Mcg Tabs (Levothyroxine Sodium) .... Take 1 Tab Once Daily 3)  Prevacid 15 Mg Cpdr (Lansoprazole) .SABRA.. 1 By Mouth Once Daily 4)  Ativan  0.5 Mg Tabs (Lorazepam ) .SABRA.. 1 By Mouth Three Times A Day As Needed 5)  Ultram 50 Mg Tabs (Tramadol Hcl) .SABRA.. 1-2 By Mouth Every 6 Hours As Needed  Allergies: 1)  ! Prednisone 2)  ! Vicodin  Past History:  Past medical, surgical, family and social histories (including risk factors) reviewed, and no changes noted (except as noted below).  Past Medical History: Reviewed history from 04/16/2008 and no changes required. GERD endometriosis----Kaplan Lupus back pain Current Problems:  PLANTAR FASCIITIS, BILATERAL (ICD-728.71) ABDOMINAL PAIN OTHER SPECIFIED SITE (ICD-789.09) BACK PAIN (ICD-724.5) ABDOMINAL BLOATING (ICD-787.3) SNORING, HX OF (ICD-V15.89) PREVENTIVE HEALTH CARE (ICD-V70.0) FAMILY HISTORY DEPRESSION (ICD-V17.0) THYROID  CYST (ICD-246.2)  LUPUS ERYTHEMATOSUS (ICD-695.4) ENDOMETRIOSIS OF OTHER SPECIFIED SITES (ICD-617.8) GERD (ICD-530.81) HERNIATED LUMBAR DISC (ICD-722.10)   Anxiety Nephrolithiasis, hx of Pancreatitis, hx of  Past Surgical History: Reviewed history from 11/29/2007 and no changes required. laporoscopy---x 3 thyroid  cyst----Dr Spree-- [facility] Appendectomy [id number]---6 miscarriages   Family History: Reviewed history from  07/13/2007 and no changes required. Family History Depression-- M with bipolar Family History High cholesterol Family History Hypertension   allergies: mother, sister asthma: mother rheumatism: maternal grandmother  Social History: Reviewed history from 11/29/2007 and no changes required. Occupation: not working because of Lupus Married Never Smoked Alcohol use-no Drug use-no Regular exercise-yes   Review of Systems      See HPI  Physical Exam  General:  Well-developed,well-nourished,in no acute distress; alert,appropriate and cooperative throughout examination Lungs:  Normal respiratory effort, chest expands symmetrically. Lungs are clear to auscultation, no crackles or wheezes. Heart:  normal rate and no murmur.   Extremities:  No clubbing, cyanosis, edema, or deformity noted with normal full range of motion of all joints.   Neurologic:  alert & oriented X3 and strength normal in all extremities.   Skin:  Intact without suspicious lesions or rashes Cervical Nodes:  No lymphadenopathy noted Psych:  Cognition and judgment appear intact. Alert and cooperative with normal attention span and concentration. No apparent delusions, illusions, hallucinations   Impression & Recommendations:  Problem # 1:  MYALGIA (ICD-729.1)  Her updated medication list for this problem includes:    Ultram 50 Mg Tabs (Tramadol hcl) .SABRA... 1-2 by mouth every 6 hours as needed  Orders: Venipuncture (63584) T-Antinuclear Antib (ANA) [phone]) T-Lyme Disease [phone]) T- * Misc. Laboratory test [phone]) T- * Misc. Laboratory test [phone]) Venipuncture (63584) TLB-CBC Platelet - w/Differential (85025-CBCD) TLB-Sedimentation Rate (ESR) (85652-ESR) TLB-Hepatic/Liver Function Pnl (80076-HEPATIC) TLB-BMP (Basic Metabolic Panel-BMET) (80048-METABOL) TLB-TSH (Thyroid  Stimulating Hormone) (84443-TSH) TLB-Rheumatoid Factor (RA) (13568-MJ)  Problem # 2:  INSOMNIA (ICD-780.52)  Her updated  medication list for this problem includes:    Ambien  10 Mg Tabs (Zolpidem  tartrate) .SABRA... Take one tablet at bedtime  Discussed sleep hygiene.   Complete Medication List: 1)  Ambien  10 Mg Tabs (Zolpidem  tartrate) .... Take one tablet at bedtime 2)  Levoxyl 50 Mcg Tabs (Levothyroxine sodium) .... Take 1 tab once daily 3)  Prevacid 15 Mg Cpdr (Lansoprazole) .SABRA.. 1 by mouth once daily 4)  Ativan  0.5 Mg Tabs (Lorazepam ) .SABRA.. 1 by mouth three times a day as needed 5)  Ultram 50 Mg Tabs (Tramadol hcl) .SABRA.. 1-2 by mouth every 6 hours as needed Prescriptions: ULTRAM 50 MG TABS (TRAMADOL HCL) 1-2 by mouth every 6 hours as needed  #30 x 2   Entered and Authorized by:   Jamee Shanks DO   Signed by:   Jamee Shanks DO on 12/14/2008   Method used:   Electronically to        [facility] [address] [phone]* (retail)       [address]       [place], [place]  [address]       Ph: [phone] or [phone]       Fax: [phone]   RxID:   [id number] AMBIEN  10 MG  TABS (ZOLPIDEM  TARTRATE) take one tablet at bedtime  #30 x 0   Entered and Authorized by:   Jamee Shanks DO   Signed by:   Jamee Shanks DO on 12/14/2008   Method used:   Print then Give to Patient   RxID:   [id number]    EKG  Procedure date:  12/14/2008  Findings:      Normal sinus rhythm with rate of:  74 bpm

## 2010-05-13 NOTE — Progress Notes (Signed)
 Summary: [facility] 08/29/07  Phone Note Call from Patient   Caller: Spouse Call For: JOSH Summary of Call: DR.LOWNE   CALL BACK [phone]  PT'S HUSBAND WANTS TO TALK TO THE NURSE::: [place] his wife is having a really bad Galbladder attack, cant hold any food down ( x 4 to 5 day) went to the ER on friday and they gave her fluids but thats it, He said he need to know what he can do  Initial call taken by: Vanessa Jordan,  Aug 29, 2007 3:01 PM  Follow-up for Phone Call        Left message for patient to call the office or spouse to call the office Rosaline Elliot  Aug 29, 2007 3:28 PM  Follow-up by: Rosaline Elliot,  Aug 29, 2007 3:28 PM  Additional Follow-up for Phone Call Additional follow up Details #1::        Left message for patient or spouse to call the office Rosaline Elliot  Aug 30, 2007 12:42 PM  Additional Follow-up by: Rosaline Elliot,  Aug 30, 2007 12:42 PM    Additional Follow-up for Phone Call Additional follow up Details #2::    Left message for patient to call the office or spouse to call the office Rosaline Elliot  Sep 01, 2007 10:38 AM  Follow-up by: Rosaline Elliot,  Sep 01, 2007 10:38 AM  Additional Follow-up for Phone Call Additional follow up Details #3:: Details for Additional Follow-up Action Taken: Left message for patient to return call Rosaline Elliot  Sep 01, 2007 3:48 PM  CALLED PT AGAIN, THIS TIME THE LINE WAS BUSY...TO MICHELLE TO SEE IF PT WENT TO THE ER.Montie Moats  Sep 01, 2007 4:57 PM  CDAVIS... Left message for patient to call the office Rosaline Elliot  Sep 02, 2007 9:36 AM  Additional Follow-up by: Rosaline Elliot,  Sep 01, 2007 3:48 PM

## 2010-05-13 NOTE — Progress Notes (Signed)
 Summary: Ambien  Request   Phone Note Refill Request   Refills Requested: Medication #1:  AMBIEN  10 MG  TABS take one tablet at bedtime   Last Refilled: 01/11/2009 Last OV- 12/31/08  Initial call taken by: Edsel Fletcher CMA,  February 11, 2009 2:48 PM  Follow-up for Phone Call        ok to refill Follow-up by: Jamee Shanks DO,  February 11, 2009 3:44 PM  Additional Follow-up for Phone Call Additional follow up Details #1::        faxed to pharmacy Additional Follow-up by: Edsel Fletcher CMA,  February 11, 2009 3:56 PM    Prescriptions: AMBIEN  10 MG  TABS (ZOLPIDEM  TARTRATE) take one tablet at bedtime  #30 x 0   Entered by:   Edsel Fletcher CMA   Authorized by:   Jamee Shanks DO   Signed by:   Edsel Fletcher CMA on 02/11/2009   Method used:   Printed then faxed to ...       [facility] [address] [phone]* (retail)       [address]       [place], [place]  [address]       Ph: [phone] or [phone]       Fax: [phone]   RxID:   [id number]

## 2010-05-13 NOTE — Letter (Signed)
 Summary: Primary Care Consult Scheduled Letter  [place] [facility] [place]  [address] [place], [place] [address]   Phone: [phone]  Fax: [phone]      03/02/2008 MRN: [id number]  [facility] [address] [place], [place]  [address]    Dear [name],      We have scheduled an appointment for you.  At the recommendation of Dr.Lowne, we have scheduled you a consult with Dr. Roark at [facility] on November 30th at 2:00pm.  Their address is [address] [place]. [address]. The office phone number is [phone].  If this appointment day and time is not convenient for you, please feel free to call the office of the doctor you are being referred to at the number listed above and reschedule the appointment.     It is important for you to keep your scheduled appointments. We are here to make sure you are given good patient care. If you have questions or you have made changes to your appointment, please notify us  at  [phone], ask for Tiffany.    Thank you,  Patient Care Coordinator [place] at [facility]

## 2010-05-13 NOTE — Progress Notes (Signed)
 Summary: sick on stomach/fyi ED  Phone Note Call from Patient Call back at Home Phone [phone] Call back at [phone]   Caller: Spouse Complaint: Nausea/Vomiting/Diarrhea Summary of Call: dr. antonio pt  is having diarrhea, vomiting and pain that starts in her back which goes to her stomach. pt went to [facility] hospital and check her liver and informed her it might be gal  bladder. pt has a new pt apt with dr. antonio on the 16th of feb.  Initial call taken by: Annabella Kerns,  April 29, 2007 3:38 PM  Follow-up for Phone Call        s/w pt who said had miscarriage 2 wks ago [facility] except liver enzymes elevated could be my gallbladder, pt said feel pretty rotten c/o low back pain told pt need to go to [facility]  for assessmenttoday keep new appt with dr antonio on 16th of feb...................................................................SABRALonney Gosling  April 29, 2007 4:40 PM  Follow-up by: Lonney Gosling,  April 29, 2007 4:40 PM         Appended Document: sick on stomach/fyi ED noted

## 2010-05-13 NOTE — Progress Notes (Signed)
Summary: going to er  Phone Note Call from Patient Call back at [phone]   Caller: Patient Summary of Call: Extreme R flank pain xlast night thinks it may a kidney stone due to the severeness of the pain and patient unable to void completely and painful w/ urination spoke w/ husband advise patient needs to go to emergency room and he agreed w/ plan...........Marland KitchenDoristine Devoid  April 23, 2009 10:17 AM

## 2010-05-13 NOTE — Letter (Signed)
Summary: [facility]  [facility]   Imported By: Lanelle Bal 05/13/2009 14:17:44  _____________________________________________________________________  External Attachment:    Type:   Image     Comment:   External Document

## 2010-05-13 NOTE — Progress Notes (Signed)
 Summary: triage   Phone Note From Other Clinic Call back at [phone]   Caller: Referral Coordinator Renee Call For: Avram Reason for Call: Schedule Patient Appt Summary of Call: Dr Antonio would like this patient seen before 1st available 1-10 do to severe vomitting and abs pain , ultrasound showed fatty liver. Initial call taken by: Esmeralda Monegro,  March 19, 2009 9:02 AM  Follow-up for Phone Call        Patient  will come see Vina Dasen RNP 03-21-09 9:00 Follow-up by: Tylene Molt RN, CGRN,  March 19, 2009 9:39 AM

## 2010-05-13 NOTE — Consult Note (Signed)
Summary: [facility] Rheumatology & Clinical Immunology  [facility] Rheumatology & Clinical Immunology   Imported By: Lanelle Bal 06/28/2009 09:28:16  _____________________________________________________________________  External Attachment:    Type:   Image     Comment:   External Document

## 2010-05-13 NOTE — Letter (Signed)
 Summary: Primary Care Consult Scheduled Letter  [place] [facility] [place]  [address] [place], [place] [address]   Phone: [phone]  Fax: [phone]      01/16/2008 MRN: [id number]  [facility] [address] [place], [place]  [address]    Dear [name],      We have scheduled an appointment for you.  At the recommendation of Dr.Lowne, we have scheduled you a consult with Dr. Tommas at [facility] on November 3rd at 10:00am.  Their address is [address] [place]. The office phone number is [phone].  If this appointment day and time is not convenient for you, please feel free to call the office of the doctor you are being referred to at the number listed above and reschedule the appointment.     It is important for you to keep your scheduled appointments. We are here to make sure you are given good patient care. If you have questions or you have made changes to your appointment, please notify us  at  [phone], ask for Tiffany.    Thank you,  Patient Care Coordinator [place] at [facility]

## 2010-05-13 NOTE — Letter (Signed)
 Summary: Results Follow up Letter  [place] [facility] [place]  [address] [place], [place] [address]   Phone: [phone]  Fax: [phone]    06/09/2007 MRN: [id number]  [facility] [address] [place], [place]  [address]  Dear [name],  The following are the results of your recent test(s):  Test         Result    Pap Smear:        Normal _____  Not Normal _____ Comments: ______________________________________________________ Cholesterol: LDL(Bad cholesterol):         Your goal is less than:         HDL (Good cholesterol):       Your goal is more than: Comments:  ______________________________________________________ Mammogram:        Normal _____  Not Normal _____ Comments:  ___________________________________________________________________ Hemoccult:        Normal _____  Not normal _______ Comments:    _____________________________________________________________________ Other Tests:  SEE ATTACHED LABS AND COMMENTS   We routinely do not discuss normal results over the telephone.  If you desire a copy of the results, or you have any questions about this information we can discuss them at your next office visit.   Sincerely,

## 2010-05-13 NOTE — Progress Notes (Signed)
" °  Phone Note Outgoing Call   Summary of Call: new med for sleep----silenor 6 mg  1 by mouth at bedtime  #30   Initial call taken by: Jamee Shanks DO,  February 21, 2009 10:11 AM  Follow-up for Phone Call        pt is coming tomorrow am, will pick up samples then.  Follow-up by: Edsel Fletcher CMA,  February 21, 2009 11:03 AM    "

## 2010-05-13 NOTE — Progress Notes (Signed)
 Summary: ambien  rx//[facility]  Phone Note Refill Request Call back at Home Phone [phone] Message from:  [facility]  Refills Requested: Medication #1:  AMBIEN  10 MG  TABS take one tablet at bedtime last ov 05/08/08, last refill #30 x 2 on 06/01/08  Initial call taken by: Lonney Gosling,  Aug 23, 2008 4:23 PM  Follow-up for Phone Call        ok to refill x1 Follow-up by: Jamee Shanks DO,  Aug 23, 2008 5:19 PM  Additional Follow-up for Phone Call Additional follow up Details #1::        pt informed rx faxed .Lonney Gosling  Aug 24, 2008 3:08 PM  Additional Follow-up by: Lonney Gosling,  Aug 24, 2008 3:08 PM      Prescriptions: AMBIEN  10 MG  TABS (ZOLPIDEM  TARTRATE) take one tablet at bedtime  #30 x 0   Entered by:   Lonney Gosling   Authorized by:   Jamee Shanks DO   Signed by:   Lonney Gosling on 08/24/2008   Method used:   Printed then faxed to ...       [facility]  [address] [phone]* (retail)       [address]       [place], [place]  [address]       Ph: [phone] or [phone]       Fax: [phone]   RxID:   [id number]

## 2010-05-13 NOTE — Procedures (Signed)
 Summary: EGD  EGD   Imported By: Tilton Dinsmore 08/12/2007 16:48:28  _____________________________________________________________________  External Attachment:    Type:   Image     Comment:   External Document

## 2010-05-13 NOTE — Progress Notes (Signed)
 Summary: [facility]-----RX  Phone Note Refill Request   Refills Requested: Medication #1:  LORAZEPAM  0.5 MG [facility] [phone]  Initial call taken by: Tilton Dinsmore,  July 27, 2008 10:12 AM  Follow-up for Phone Call        last refill 06/21/08 and last office visit 05/08/08.....................SABRAColeta Molt  July 27, 2008 10:22 AM   Additional Follow-up for Phone Call Additional follow up Details #1::        ok to refill x1 Additional Follow-up by: Jamee Shanks DO,  July 27, 2008 11:02 AM      Prescriptions: ATIVAN  0.5 MG TABS (LORAZEPAM ) 1 by mouth three times a day as needed  #30 x 0   Entered by:   Coleta Molt   Authorized by:   Jamee Shanks DO   Signed by:   Coleta Molt on 07/27/2008   Method used:   Telephoned to ...       [facility] [address] [phone]* (retail)       [address]       [place], [place]  [address]       Ph: [phone] or [phone]       Fax: [phone]   RxID:   [id number]

## 2010-05-13 NOTE — Progress Notes (Signed)
 Summary: [facility]--RX ATIVAN  0.5 MG TABS  Phone Note Refill Request   Refills Requested: Medication #1:  ATIVAN  0.5 MG TABS 1 by mouth three times a day as needed   Last Refilled: 05/02/2008 [facility] [address] [facility][phone]  Initial call taken by: Tilton Dinsmore,  June 21, 2008 3:05 PM  Follow-up for Phone Call        last filled 05-02-08 #30, last ov 05-08-08..........SABRAFelecia Deloach CMA  June 21, 2008 3:13 PM  Additional Follow-up for Phone Call Additional follow up Details #1::        ok to refill x1 Additional Follow-up by: Jamee Shanks DO,  June 21, 2008 4:14 PM      Prescriptions: ATIVAN  0.5 MG TABS (LORAZEPAM ) 1 by mouth three times a day as needed  #30 x 0   Entered by:   Jackson Garner CMA   Authorized by:   Jamee Shanks DO   Signed by:   Jackson Garner CMA on 06/21/2008   Method used:   Printed then faxed to ...       [facility] [address] [phone]* (retail)       [address]       [place], [place]  [address]       Ph: [phone] or [phone]       Fax: [phone]   RxID:   [id number]

## 2010-05-13 NOTE — Progress Notes (Signed)
Summary: Refill request  Phone Note Refill Request   Refills Requested: Medication #1:  AMBIEN 10 MG TABS 1 by mouth every at bedtime as needed insomnia   Last Refilled: 04/08/2009 last ov-04/17/08 Also needs an rx for Prevacid so she can use her flex spending account.  Initial call taken by: Army Fossa CMA,  May 07, 2009 1:15 PM  Follow-up for Phone Call        ok to refill x1 ok to send prevacid for a year Follow-up by: Loreen Freud DO,  May 07, 2009 1:53 PM  Additional Follow-up for Phone Call Additional follow up Details #1::        done. Army Fossa CMA  May 07, 2009 4:03 PM     Prescriptions: AMBIEN 10 MG TABS (ZOLPIDEM TARTRATE) 1 by mouth every at bedtime as needed insomnia  #30 x 0   Entered by:   Army Fossa CMA   Authorized by:   Loreen Freud DO   Signed by:   Army Fossa CMA on 05/07/2009   Method used:   Printed then faxed to ...       [facility] [address] [phone]* (retail)       [address]       [place], [place]  [address]       Ph: [phone] or [phone]       Fax: [phone]   RxID:   [id number] PREVACID 15 MG CPDR (LANSOPRAZOLE) Take 1 capsule by mouth once a day  #30 x 11   Entered by:   Army Fossa CMA   Authorized by:   Loreen Freud DO   Signed by:   Army Fossa CMA on 05/07/2009   Method used:   Electronically to        [facility] [address] [phone]* (retail)       [address]       [place], [place]  [address]       Ph: [phone] or [phone]       Fax: [phone]   RxID:   [id number]

## 2010-05-13 NOTE — Letter (Signed)
 Summary: Results Follow up Letter  [facility] [place]  [address] [place], [place] [address]   Phone: [phone]  Fax: [phone]    03/15/2009 MRN: [id number]  [facility] SENIC [address] [place], [place]  [address]  Dear [name],  The following are the results of your recent test(s):  Test         Result    Pap Smear:        Normal _____  Not Normal _____ Comments: ______________________________________________________ Cholesterol: LDL(Bad cholesterol):         Your goal is less than:         HDL (Good cholesterol):       Your goal is more than: Comments:  ______________________________________________________ Mammogram:        Normal _____  Not Normal _____ Comments:  ___________________________________________________________________ Hemoccult:        Normal _____  Not normal _______ Comments:    _____________________________________________________________________ Other Tests:  See attachment for results.  We routinely do not discuss normal results over the telephone.  If you desire a copy of the results, or you have any questions about this information we can discuss them at your next office visit.   Sincerely,    Edsel Fletcher CMA  March 15, 2009 8:50 AM

## 2010-05-13 NOTE — Assessment & Plan Note (Signed)
Summary: FLU SHOT//KN  Flu Vaccine Consent Questions     Do you have a history of severe allergic reactions to this vaccine? no    Any prior history of allergic reactions to egg and/or gelatin? no    Do you have a sensitivity to the preservative Thimersol? no    Do you have a past history of Guillan-Barre Syndrome? no    Do you currently have an acute febrile illness? no    Have you ever had a severe reaction to latex? no    Vaccine information given and explained to patient? yes    Are you currently pregnant? no    Lot Number:[id number]   Exp Date:10/11/2010   Site Given  Left Deltoid IM    Nurse Visit   Allergies: 1)  ! Prednisone 2)  ! Vicodin 3)  ! Compazine  Orders Added: 1)  Admin 1st Vaccine [90471] 2)  Flu Vaccine 37yrs + [16109]

## 2010-05-13 NOTE — Letter (Signed)
 Summary: Primary Care Consult Scheduled Letter  [facility] [place]  [address] [place], [place] [address]   Phone: [phone]  Fax: [phone]      06/10/2007 MRN: [id number]  [facility] SENIC [address] [place], [place]  [address]    Dear [name],      We have scheduled an appointment for you.  At the recommendation of Dr.LOWNE, we have scheduled you a consult with DR AVRAM Caraway [facility] [address] on 03.23.09 @ 2:00.  Their phone number is  [phone].  If this appointment day and time is not convenient for you, please feel free to call the office of the doctor you are being referred to at the number listed above and reschedule the appointment.     It is important for you to keep your scheduled appointments. We are here to make sure you are given good patient care. If yu have questions or you have made changes to your appointment, please notify us  at  [phone], ask for [facility].    Thank you,  Patient Care Coordinator  at [facility]

## 2010-05-13 NOTE — Progress Notes (Signed)
 Summary: REFILL REQUEST  Phone Note Refill Request   Refills Requested: Medication #1:  AMBIEN  10 MG  TABS take one tablet at bedtime   Last Refilled: 05/30/2007 QTY-30, [facility] [address] PHONE-[phone], FAX [phone] ..................................................................SABRAPricilla Mosses  June 27, 2007 2:54 PM    Follow-up for Phone Call        ok to refill x1 Follow-up by: Jamee Shanks DO,  June 27, 2007 3:36 PM      Prescriptions: AMBIEN  10 MG  TABS (ZOLPIDEM  TARTRATE) take one tablet at bedtime  #30 x 1   Entered by:   Coleta Molt   Authorized by:   Jamee Shanks DO   Signed by:   Coleta Molt on 06/27/2007   Method used:   Printed then faxed to ...       [facility] [address] [phone]*       [address]       [place], [place]  [address]       Ph: [phone] or [phone]       Fax: [phone]   RxID:   [id number]

## 2010-05-13 NOTE — Progress Notes (Signed)
Summary: ABX request  Phone Note Call from Patient Call back at Home Phone [phone]   Summary of Call: Patient left message on triage that her cough has returned and is really bad. She would like ABX sent to her pharmacy b/c she does not have a way to the office to be seen. Please advise. Initial call taken by: Lucious Groves CMA,  March 17, 2010 9:12 AM  Follow-up for Phone Call        biaxin xl 500mg   2 by mouth once daily for 7 days   no refills  must have ov if no better after that Follow-up by: Loreen Freud DO,  March 17, 2010 10:17 AM  Additional Follow-up for Phone Call Additional follow up Details #1::        VM left advising Pt Rx sent to pharmacy..... Almeta Monas CMA Duncan Dull)  March 17, 2010 10:33 AM     New/Updated Medications: BIAXIN XL 500 MG XR24H-TAB (CLARITHROMYCIN) 2 by mouth once daily x7days Prescriptions: BIAXIN XL 500 MG XR24H-TAB (CLARITHROMYCIN) 2 by mouth once daily x7days  #14 x 0   Entered by:   Almeta Monas CMA (AAMA)   Authorized by:   Loreen Freud DO   Signed by:   Almeta Monas CMA (AAMA) on 03/17/2010   Method used:   Faxed to ...       [facility] [address] [phone]* (retail)       [address]       [place], [place]  [address]       Ph: [phone] or [phone]       Fax: [phone]   RxID:   [id number]   Appended Document: [facility] and [facility] RX

## 2010-05-13 NOTE — Assessment & Plan Note (Signed)
 Summary: pain both ankles/alr   Vital Signs:  Patient profile:   35 year old female Height:      60 inches Weight:      181 pounds BMI:     35.48 Temp:     98.0 degrees F oral Pulse rate:   74 / minute Resp:     18 per minute BP sitting:   120 / 72  (right arm)  Vitals Entered By: Rosaline Elliot (September 21, 2008 2:50 PM) CC: pain in both ankles, started when she started skating again Is Patient Diabetic? No  Have you ever been in a relationship where you felt threatened, hurt or afraid?No   Does patient need assistance? Functional Status Self care Ambulation Normal   History of Present Illness: Pt here with pain in both ankles R>L since roller skating again.   No known injury.    Current Medications (verified): 1)  Ambien  10 Mg  Tabs (Zolpidem  Tartrate) .... Take One Tablet At Bedtime 2)  Levoxyl 50 Mcg Tabs (Levothyroxine Sodium) .... Take 1 Tab Once Daily 3)  Prevacid 15 Mg Cpdr (Lansoprazole) .SABRA.. 1 By Mouth Once Daily 4)  Ativan  0.5 Mg Tabs (Lorazepam ) .SABRA.. 1 By Mouth Three Times A Day As Needed  Allergies (verified): 1)  ! Prednisone 2)  ! Vicodin  Past History:  Past medical, surgical, family and social histories (including risk factors) reviewed, and no changes noted (except as noted below).  Past Medical History: Reviewed history from 04/16/2008 and no changes required. GERD endometriosis----Kaplan Lupus back pain Current Problems:  PLANTAR FASCIITIS, BILATERAL (ICD-728.71) ABDOMINAL PAIN OTHER SPECIFIED SITE (ICD-789.09) BACK PAIN (ICD-724.5) ABDOMINAL BLOATING (ICD-787.3) SNORING, HX OF (ICD-V15.89) PREVENTIVE HEALTH CARE (ICD-V70.0) FAMILY HISTORY DEPRESSION (ICD-V17.0) THYROID  CYST (ICD-246.2)  LUPUS ERYTHEMATOSUS (ICD-695.4) ENDOMETRIOSIS OF OTHER SPECIFIED SITES (ICD-617.8) GERD (ICD-530.81) HERNIATED LUMBAR DISC (ICD-722.10)   Anxiety Nephrolithiasis, hx of Pancreatitis, hx of  Past Surgical History: Reviewed history from 11/29/2007 and  no changes required. laporoscopy---x 3 thyroid  cyst----Dr Spree-- [place] Appendectomy [id number]---6 miscarriages   Family History: Reviewed history from 07/13/2007 and no changes required. Family History Depression-- M with bipolar Family History High cholesterol Family History Hypertension   allergies: mother, sister asthma: mother rheumatism: maternal grandmother  Social History: Reviewed history from 11/29/2007 and no changes required. Occupation: not working because of Lupus Married Never Smoked Alcohol use-no Drug use-no Regular exercise-yes   Review of Systems      See HPI  Physical Exam  General:  Well-developed,well-nourished,in no acute distress; alert,appropriate and cooperative throughout examination Msk:  normal ROM, no joint tenderness, no joint swelling, no joint warmth, no redness over joints, no joint deformities, and no crepitation.   Extremities:  No clubbing, cyanosis, edema, or deformity noted with normal full range of motion of all joints.   Skin:  Intact without suspicious lesions or rashes Psych:  Oriented X3 and normally interactive.     Impression & Recommendations:  Problem # 1:  FOOT SPRAIN (ICD-845.10)  Orders: Ankle / Wrist Splint (A4570)  Instructed to use a compression wrap, elevate the affected area, apply ICE for 20 minutes every hour while awake for next 3 days, and rest. Start physical therapy as directed and recheck in 10-14 days if no improvement, sooner if worse.  Problem # 2:  INSOMNIA (ICD-780.52)  Orders: Ankle / Wrist Splint (J5429)  Her updated medication list for this problem includes:    Ambien  10 Mg Tabs (Zolpidem  tartrate) .SABRA... Take one tablet at bedtime  Discussed sleep hygiene.  Complete Medication List: 1)  Ambien  10 Mg Tabs (Zolpidem  tartrate) .... Take one tablet at bedtime 2)  Levoxyl 50 Mcg Tabs (Levothyroxine sodium) .... Take 1 tab once daily 3)  Prevacid 15 Mg Cpdr (Lansoprazole) .SABRA.. 1 by mouth  once daily 4)  Ativan  0.5 Mg Tabs (Lorazepam ) .SABRA.. 1 by mouth three times a day as needed Prescriptions: AMBIEN  10 MG  TABS (ZOLPIDEM  TARTRATE) take one tablet at bedtime  #30 x 0   Entered and Authorized by:   Jamee Shanks DO   Signed by:   Jamee Shanks DO on 09/21/2008   Method used:   Print then Give to Patient   RxID:   [id number]

## 2010-05-13 NOTE — Progress Notes (Signed)
 Summary: AMBIEN  10 MG  TABS  Phone Note Refill Request Message from:  Patient  Refills Requested: Medication #1:  AMBIEN  10 MG  TABS take one tablet at bedtime .SABRASABRAFelecia Deloach CMA  October 18, 2008 3:08 PM   Follow-up for Phone Call        09-21-08 #30 last ov 10-11-08.SABRASABRAFelecia Deloach CMA  October 18, 2008 3:11 PM  Additional Follow-up for Phone Call Additional follow up Details #1::        OK X1      Prescriptions: AMBIEN  10 MG  TABS (ZOLPIDEM  TARTRATE) take one tablet at bedtime  #30 x 0   Entered by:   Jackson Garner CMA   Authorized by:   Elsie Roses MD   Signed by:   Jackson Garner CMA on 10/19/2008   Method used:   Printed then faxed to ...       [facility] [address] [phone]* (retail)       [address]       [place], [place]  [address]       Ph: [phone] or [phone]       Fax: [phone]   RxID:   [id number]

## 2010-05-13 NOTE — Progress Notes (Signed)
 Summary: refill - dr antonio  Phone Note Refill Request Call back at Home Phone [phone]   Refills Requested: Medication #1:  DARVOCET-N 100 100-650 MG  TABS take 1-2 tablets every 4-6 hours as needed calll when rx ready  Initial call taken by: Donna Haas,  June 20, 2007 10:28 AM  Follow-up for Phone Call        LAST FILLED 05/30/2007 ..................................................................[name]  June 20, 2007 10:47 AM   Additional Follow-up for Phone Call Additional follow up Details #1::        ok to fill x1 Additional Follow-up by: Jamee Antonio DO,  June 20, 2007 10:52 AM    Additional Follow-up for Phone Call Additional follow up Details #2::    [phone]), PER PATIENT REQUEST . ..................................................................[name]  June 21, 2007 1:33 PM     Prescriptions: DARVOCET-N 100 100-650 MG  TABS (PROPOXYPHENE N-APAP) take 1-2 tablets every 4-6 hours as needed  #60 x 0   Entered by:   Pricilla Mosses   Authorized by:   Jamee Antonio DO   Signed by:   Pricilla Mosses on 06/21/2007   Method used:   Print then Give to Patient   RxID:   [id number]

## 2010-05-13 NOTE — Progress Notes (Signed)
 Summary: AMBIEN  REFILL//lOWNE  Phone Note Refill Request Call back at Home Phone [phone] Message from:  Patient  Refills Requested: Medication #1:  AMBIEN  10 MG  TABS take one tablet at bedtime PT REQUESTING REFILL OF AMBIEN , NOT DUE FOR REFILL UNTIL SUNDAY SINCE  THE WEEKEND CAN WE CALL IN TODAY?  Initial call taken by: Alida Rogers,  March 02, 2008 1:44 PM  Follow-up for Phone Call        yes Follow-up by: Cayetano Mikita Lowne DO,  March 02, 2008 2:39 PM  Additional Follow-up for Phone Call Additional follow up Details #1::        Patient aware, left detailed message rx sent to pharmacy Michelle Utrera  March 04, 2008 7:44 PM  Additional Follow-up by: Michelle Utrera,  March 04, 2008 7:44 PM      Prescriptions: AMBIEN 10 MG  TABS (ZOLPIDEM TARTRATE) take one tablet at bedtime  #30 x 0   Entered by:   Michelle Utrera   Authorized by:   Lashai Grosch Lowne DO   Signed by:   Michelle Utrera on 03/04/2008   Method used:   Telephoned to ...       [facility]  [address] [id number] (retail)       [address]       [place], [place]  [address]       Ph: [phone] or [phone]       Fax: [phone]   RxID:   [id number]

## 2010-05-13 NOTE — Progress Notes (Signed)
 Summary: fyi dr antonio  Phone Note Call from Patient Call back at [facility] Phone [phone]   Caller: Patient Summary of Call: ZEGERID -- PT WANTING TO KNOW IS THIS FOR ACID REFLUX, B/C SHE IS ALREADY TAKING PRILOSEC. AND IF SO CAN THEY BE TAKEN TOGETHER. PT AWARE THAT DR ANTONIO IS NOT IN ON WED AFTERNOON Initial call taken by: Bernarda Mace,  June 15, 2007 3:57 PM  Follow-up for Phone Call        left message for pt yes,zergried is for gerd, Dr Antonio will have to address if she want you to have -please advise aslo dr antonio there were several appends on my desktop need ov if did not go to ed  ?? please advise...................................................................SABRALonney Gosling  June 15, 2007 5:24 PM  Follow-up by: Lonney Gosling,  June 15, 2007 5:24 PM  Additional Follow-up for Phone Call Additional follow up Details #1::        Dr jimmy said in his phone note that pt needed OV to f/u ?seizure activity. Zegerid is stronger than priosec--  we switched to zegerid when abd was not getting better---- Take one or the other not both. Additional Follow-up by: Jamee Antonio DO,  June 15, 2007 5:34 PM    Additional Follow-up for Phone Call Additional follow up Details #2::    Left msg for pt to call ...................................................................SABRALonney Gosling  June 16, 2007 8:27 AM  Pt called back informed her per Dr Lowne Zegrid is stronger than Prilosec and it was changed when abdomen was not getting better.Take one or the other do not take both pt agreed.   ALSO FYI DR ANTONIO THAT CALL TO DR JIMMY ON 06/14/07 WAS A MISTAKE IT WAS HER DAUGHTER  NOT HER. ,HUSBAND CALLED THE WRONG DR. THEY HAVE IT RESOLVED DAUGHTER IS SEING A NEUROLOGIST...................................................................SABRALonney Gosling  June 16, 2007 11:35 AM   Follow-up by: Lonney Gosling,  June 16, 2007 11:35 AM

## 2010-05-13 NOTE — Progress Notes (Signed)
 Summary: ABD PAIN  Phone Note Call from Patient Call back at [facility] Phone [phone]   Caller: Patient Complaint: Abdominal Pain Summary of Call: DR. ANTONIO PT IS HAVING  ABDOMINAL PAIN. PT ALSO HAS AN ULTRASOUND ON WEDNESDAY. SHE  WANTED TO KNOW  WHAT SHE SHOULD DO UNTIL THEN.  Initial call taken by: Annabella Kerns,  June 06, 2007 11:14 AM  Follow-up for Phone Call        PATIENT STILL WITH PAIN FROM LAST OFFICE VISIT, PATIENT WAS SUPPOSE TO HAVE U/S LAST WEEK AND WAS UNABLE TO B/C HER DAUGHTER WAS SICK. PATIENT RESCHEDULE U/S FOR THIS WED. ON A SCALE FROM 1-10 PATIENT RATES PAIN AS A 7. PATIENT WOULD LIKE TO KNOW WHAT SHE CAN DO FOR PAIN? WILL FORWARD TO DR.LOWNE THEN BACK TO TRIAGE TO CONTACT PATIENT BACK. Follow-up by: Pricilla Mosses,  June 06, 2007 12:02 PM  Additional Follow-up for Phone Call Additional follow up Details #1::        go to ER if pain is severe.--- If she postponed US  they will notusually do it sooner unless she is in the ER. We can call in nexium 40 mg 1 by mouth once daily #30 2 refills----but if she gets no relief she must go to ER.   Her liver function was elevated.  -----I will see if bernarda can get it done today--- she should not eat anything Additional Follow-up by: Jamee Antonio DO,  June 06, 2007 12:17 PM    Additional Follow-up for Phone Call Additional follow up Details #2::    SPOKE WITH PATIENT, PATIENT CURRENTLY ON OMEPRAZOLE, PER DR.LOWNE LETS TRY ZEGRID (CAN GIVE SAMPLES) INSTEAD OF NEXIUM. PATIENT OK TO TRY SAMPLES AND WOULD ALSO LIKE [facility] IF NEEDED. PATIENT AWARE THAT ALICIA WILL TRY TO GET U/S SET UP FOR TODAY. IF APPOINTMENT CAN BE CHANGED ALICIA WILL CONTACT PATIENT.  Follow-up by: Pricilla Mosses,  June 06, 2007 12:41 PM  Additional Follow-up for Phone Call Additional follow up Details #3:: Details for Additional Follow-up Action Taken: PATIENT CALLED BACK, UNABLE TO FIND A SITTER FOR HER CHILD, WILL KEEP  APPOINTMENT FOR WED (U/S) PATIENT WILL PICK-UP SAMPLES, GO TO ER IF PAIN GETS WORSE. Additional Follow-up by: Pricilla Mosses,  June 06, 2007 12:51 PM  New/Updated Medications: ZEGERID 40-1100 MG  CAPS (OMEPRAZOLE-SODIUM BICARBONATE) 1 by mouth once daily   Prescriptions: ZEGERID 40-1100 MG  CAPS (OMEPRAZOLE-SODIUM BICARBONATE) 1 by mouth once daily  #30 x 2   Entered by:   Pricilla Mosses   Authorized by:   Jamee Antonio DO   Signed by:   Pricilla Mosses on 06/06/2007   Method used:   Electronically sent to ...       [facility] [address] [phone]*       [address]       [place], [place]  [address]       Ph: [phone] or [phone]       Fax: [phone]   RxID:   [id number]

## 2010-05-13 NOTE — Progress Notes (Signed)
 Summary: Regarding anemia   Phone Note Outgoing Call   Call placed by: Edsel Fletcher CMA,  February 22, 2009 4:29 PM Summary of Call: Spoke with pt about labs she is already talking Slow Fe- once daily. Is there something else that she can do?  Initial call taken by: Edsel Fletcher CMA,  February 22, 2009 4:30 PM  Follow-up for Phone Call        take it two times a day  Follow-up by: Jamee Shanks DO,  February 22, 2009 4:50 PM  Additional Follow-up for Phone Call Additional follow up Details #1::        left message letting pt know to take it twice a day.  Additional Follow-up by: Edsel Fletcher CMA,  February 22, 2009 4:52 PM

## 2010-05-15 NOTE — Progress Notes (Signed)
Summary: still no better  Phone Note Call from Patient Call back at [facility] Phone [phone]   Caller: Patient Summary of Call: Pt seen on yesterday but symptoms have increased. Pt now c/o cough, fever, difficulty breathing, SOB................Marland KitchenFelecia Deloach CMA  April 01, 2010 8:41 AM   Follow-up for Phone Call        Too soon for abx to start working and pt is allergic to prednisone.  We do not have IV meds available if pt is struggling to breath she needs to go to ER to be reevaluated.  She may need IV meds.   Follow-up by: Loreen Freud DO,  April 01, 2010 12:22 PM  Additional Follow-up for Phone Call Additional follow up Details #1::        Pt aware.Marland KitchenMarland KitchenMarland KitchenFelecia Deloach CMA  April 01, 2010 12:45 PM      Appended Document: Orders Update  Pt called stating that she is still no better. Per Dr Laury Axon Pt needs to be seen in ED if still having difficulty with breathing. Pt already Rx strongest antibiotics and has reaction to steroids so there are no other med that can be Rx at this time. Pt will be referred to Pulmonary. Pt ok all info, awaiting appt info   Clinical Lists Changes  Orders: Added new Referral order of Pulmonary Referral (Pulmonary) - Signed

## 2010-05-15 NOTE — Progress Notes (Signed)
Summary: [employer] disability claim  Phone Note Call from Patient Call back at [facility] Phone [phone]   Summary of Call: Pt states that she has gotten a Lawyer to help with her disability claim. Pt states that she is unable to work due to several conditions which we have documentation  of. Per Pt her lawyer has sent over some paperwork requesting documentation of her illness. The Pt hopes that we  will be able to help her with this matter. Per kimberly no items received as of yet, spoke with pt who states that lawyer might have sent items to wrong office. Pt was provided with fax # and advise that we will be watching for items..........Marland KitchenFelecia Deloach CMA  March 27, 2010 8:40 AM

## 2010-05-15 NOTE — Assessment & Plan Note (Signed)
Summary: COUGH/RH..........Marland Kitchen   Vital Signs:  Patient profile:   35 year old female Weight:      167.0 pounds O2 Sat:      100 % on Room air Temp:     98.3 degrees F oral Pulse rate:   118 / minute Pulse rhythm:   regular BP sitting:   112 / 72  (left arm) Cuff size:   large  Vitals Entered By: Almeta Monas CMA Duncan Dull) (March 31, 2010 2:34 PM)  O2 Flow:  Room air CC: x2 weeks c/o cough, Cough   History of Present Illness:  Cough      This is a 35 year old woman who presents with Cough.  The symptoms began 2 weeks ago.  Pt was seen in ER 2 weeks ago and put on doxy---pt is no better.   CXR done in ER.  The patient reports productive cough, shortness of breath, wheezing, and exertional dyspnea, but denies non-productive cough, pleuritic chest pain, fever, hemoptysis, and malaise.  The patient denies the following symptoms: cold/URI symptoms, sore throat, nasal congestion, chronic rhinitis, weight loss, acid reflux symptoms, and peripheral edema.  The cough is worse with exercise, activity, and lying down.  Ineffective prior treatments have included OTC cough medication and albuterol inhaler.  Diagnostic testing to date has included CXR.    Problems Prior to Update: 1)  Bronchitis- Acute  (ICD-466.0) 2)  Fibromyalgia  (ICD-729.1) 3)  Bipolar Disorder Unspecified  (ICD-296.80) 4)  Viral Uri  (ICD-465.9) 5)  Nausea and Vomiting  (ICD-787.01) 6)  Mole  (ICD-216.9) 7)  Abdominal Pain Other Specified Site  (ICD-789.09) 8)  Myalgia  (ICD-729.1) 9)  Insomnia  (ICD-780.52) 10)  Foot Sprain  (ICD-845.10) 11)  Acute Sinusitis, Unspecified  (ICD-461.9) 12)  Pancreatitis, Hx of  (ICD-V12.70) 13)  Nephrolithiasis, Hx of  (ICD-V13.01) 14)  Rom  (ICD-382.9) 15)  Anxiety  (ICD-300.00) 16)  Thyroid Nodule  (ICD-241.0) 17)  Paresthesia  (ICD-782.0) 18)  Neck Pain, Acute  (ICD-723.1) 19)  Folliculitis  (ICD-704.8) 20)  Obstructive Sleep Apnea  (ICD-327.23) 21)  Hypersomnia   (ICD-780.54) 22)  Plantar Fasciitis, Bilateral  (ICD-728.71) 23)  Abdominal Pain Other Specified Site  (ICD-789.09) 24)  Back Pain  (ICD-724.5) 25)  Abdominal Bloating  (ICD-787.3) 26)  Snoring, Hx of  (ICD-V15.89) 27)  Preventive Health Care  (ICD-V70.0) 28)  Family History Depression  (ICD-V17.0) 29)  Thyroid Cyst  (ICD-246.2) 30)  Lupus Erythematosus  (ICD-695.4) 31)  Endometriosis of Other Specified Sites  (ICD-617.8) 32)  Gerd  (ICD-530.81) 33)  Herniated Lumbar Disc  (ICD-722.10)  Medications Prior to Update: 1)  Levoxyl 50 Mcg Tabs (Levothyroxine Sodium) .... Take 1 Tab Once Daily 2)  Ambien 10 Mg Tabs (Zolpidem Tartrate) .Marland Kitchen.. 1 By Mouth Every At Bedtime As Needed Insomnia 3)  Prevacid 15 Mg Cpdr (Lansoprazole) .... Take 1 Capsule By Mouth Once A Day 4)  Klonopin 0.5 Mg Tabs (Clonazepam) .Marland Kitchen.. 1 By Mouth Two Times A Day 5)  Lexapro 10 Mg Tabs (Escitalopram Oxalate) .Marland Kitchen.. 1 By Mouth Once Daily 6)  Yaz 3-0.02 Mg Tabs (Drospirenone-Ethinyl Estradiol) .... By Mouth Once Daily 7)  Cheratussin Ac 100-10 Mg/3ml Syrp (Guaifenesin-Codeine) .Marland Kitchen.. 1-2 Tsp By Mouth At Bedtime As Needed 8)  Percocet 5-325 Mg Tabs (Oxycodone-Acetaminophen) .Marland Kitchen.. 1 By Mouth Every 6 Hours As Needed 9)  Vitamin D (Ergocalciferol) 50000 Unit Caps (Ergocalciferol) .Marland Kitchen.. 1 By Mouth Weekly 10)  Plaquenil 200 Mg Tabs (Hydroxychloroquine Sulfate) .Marland Kitchen.. 1 By Mouth Once Daily  11)  Biaxin Xl 500 Mg Xr24h-Tab (Clarithromycin) .... 2 By Mouth Once Daily X7days 12)  Diflucan 150 Mg Tabs (Fluconazole) .... Take 1 By Mouth Once Daily X1, May Repeat in 3 Days As Needed  Current Medications (verified): 1)  Levoxyl 50 Mcg Tabs (Levothyroxine Sodium) .... Take 1 Tab Once Daily 2)  Ambien 10 Mg Tabs (Zolpidem Tartrate) .Marland Kitchen.. 1 By Mouth Every At Bedtime As Needed Insomnia 3)  Prevacid 15 Mg Cpdr (Lansoprazole) .... Take 1 Capsule By Mouth Once A Day 4)  Klonopin 0.5 Mg Tabs (Clonazepam) .Marland Kitchen.. 1 By Mouth Two Times A Day 5)  Lexapro 10 Mg  Tabs (Escitalopram Oxalate) .Marland Kitchen.. 1 By Mouth Once Daily 6)  Yaz 3-0.02 Mg Tabs (Drospirenone-Ethinyl Estradiol) .... By Mouth Once Daily 7)  Percocet 5-325 Mg Tabs (Oxycodone-Acetaminophen) .Marland Kitchen.. 1 By Mouth Every 6 Hours As Needed 8)  Vitamin D (Ergocalciferol) 50000 Unit Caps (Ergocalciferol) .Marland Kitchen.. 1 By Mouth Weekly 9)  Plaquenil 200 Mg Tabs (Hydroxychloroquine Sulfate) .Marland Kitchen.. 1 By Mouth Once Daily 10)  Diflucan 150 Mg Tabs (Fluconazole) .... Take 1 By Mouth Once Daily X1, May Repeat in 3 Days As Needed 11)  Avelox 400 Mg Tabs (Moxifloxacin Hcl) .Marland Kitchen.. 1 By Mouth Once Daily 12)  Ventolin Hfa 108 (90 Base) Mcg/act Aers (Albuterol Sulfate) .... 2 Puffs Qid As Needed 13)  Advair Diskus 250-50 Mcg/dose Aepb (Fluticasone-Salmeterol) .Marland Kitchen.. 1 Inh Bid 14)  Cheratussin Ac 100-10 Mg/61ml Syrp (Guaifenesin-Codeine) .Marland Kitchen.. 1-2 Tsp By Mouth At Bedtime As Needed  Allergies (verified): 1)  ! Prednisone 2)  ! Vicodin 3)  ! Compazine  Past History:  Past Medical History: Last updated: 04/16/2008 GERD endometriosis----[facility] Lupus back pain Current Problems:  PLANTAR FASCIITIS, BILATERAL (ICD-728.71) ABDOMINAL PAIN OTHER SPECIFIED SITE (ICD-789.09) BACK PAIN (ICD-724.5) ABDOMINAL BLOATING (ICD-787.3) SNORING, HX OF (ICD-V15.89) PREVENTIVE HEALTH CARE (ICD-V70.0) FAMILY HISTORY DEPRESSION (ICD-V17.0) THYROID CYST (ICD-246.2)  LUPUS ERYTHEMATOSUS (ICD-695.4) ENDOMETRIOSIS OF OTHER SPECIFIED SITES (ICD-617.8) GERD (ICD-530.81) HERNIATED LUMBAR DISC (ICD-722.10)   Anxiety Nephrolithiasis, hx of Pancreatitis, hx of  Past Surgical History: Last updated: 02/22/2009 laporoscopy---x 3 thyroid cyst----Dr Spree-- Ferguson Appendectomy [id number]---4 miscarriages  Cholecystectomy  Family History: Last updated: 07/13/2007 Family History Depression-- M with bipolar Family History High cholesterol Family History Hypertension   allergies: mother, sister asthma: mother rheumatism: maternal  grandmother  Social History: Last updated: 11/29/2007 Occupation: not working because of Lupus Married Never Smoked Alcohol use-no Drug use-no Regular exercise-yes   Risk Factors: Exercise: yes (02/22/2009)  Risk Factors: Smoking Status: never (02/22/2009) Passive Smoke Exposure: no (02/22/2009)  Family History: Reviewed history from 07/13/2007 and no changes required. Family History Depression-- M with bipolar Family History High cholesterol Family History Hypertension   allergies: mother, sister asthma: mother rheumatism: maternal grandmother  Social History: Reviewed history from 11/29/2007 and no changes required. Occupation: not working because of Lupus Married Never Smoked Alcohol use-no Drug use-no Regular exercise-yes   Review of Systems      See HPI  Physical Exam  General:  Well-developed,well-nourished,in no acute distress; alert,appropriate and cooperative throughout examination Ears:  External ear exam shows no significant lesions or deformities.  Otoscopic examination reveals clear canals, tympanic membranes are intact bilaterally without bulging, retraction, inflammation or discharge. Hearing is grossly normal bilaterally. Nose:  External nasal examination shows no deformity or inflammation. Nasal mucosa are pink and moist without lesions or exudates. Mouth:  Oral mucosa and oropharynx without lesions or exudates.  Teeth in good repair. Neck:  No deformities, masses, or tenderness noted. Lungs:  R decreased breath sounds, R wheezes, L decreased breath sounds, and L wheezes.   Heart:  normal rate and no murmur.   Extremities:  No clubbing, cyanosis, edema, or deformity noted with normal full range of motion of all joints.   Psych:  Oriented X3 and normally interactive.     Impression & Recommendations:  Problem # 1:  BRONCHITIS- ACUTE (ICD-466.0)  The following medications were removed from the medication list:    Cheratussin Ac 100-10 Mg/74ml  Syrp (Guaifenesin-codeine) .Marland Kitchen... 1-2 tsp by mouth at bedtime as needed    Biaxin Xl 500 Mg Xr24h-tab (Clarithromycin) .Marland Kitchen... 2 by mouth once daily x7days Her updated medication list for this problem includes:    Avelox 400 Mg Tabs (Moxifloxacin hcl) .Marland Kitchen... 1 by mouth once daily    Ventolin Hfa 108 (90 Base) Mcg/act Aers (Albuterol sulfate) .Marland Kitchen... 2 puffs qid as needed    Advair Diskus 250-50 Mcg/dose Aepb (Fluticasone-salmeterol) .Marland Kitchen... 1 inh bid    Cheratussin Ac 100-10 Mg/63ml Syrp (Guaifenesin-codeine) .Marland Kitchen... 1-2 tsp by mouth at bedtime as needed  Take antibiotics and other medications as directed. Encouraged to push clear liquids, get enough rest, and take acetaminophen as needed. To be seen in 5-7 days if no improvement, sooner if worse.  Orders: T-2 View CXR (71020TC) Depo- Medrol 80mg  (J1040) Admin of Therapeutic Inj  intramuscular or subcutaneous (04540) Albuterol Sulfate Sol 1mg  unit dose (J8119) Nebulizer Tx (14782)  Complete Medication List: 1)  Levoxyl 50 Mcg Tabs (Levothyroxine sodium) .... Take 1 tab once daily 2)  Ambien 10 Mg Tabs (Zolpidem tartrate) .Marland Kitchen.. 1 by mouth every at bedtime as needed insomnia 3)  Prevacid 15 Mg Cpdr (Lansoprazole) .... Take 1 capsule by mouth once a day 4)  Klonopin 0.5 Mg Tabs (Clonazepam) .Marland Kitchen.. 1 by mouth two times a day 5)  Lexapro 10 Mg Tabs (Escitalopram oxalate) .Marland Kitchen.. 1 by mouth once daily 6)  Yaz 3-0.02 Mg Tabs (Drospirenone-ethinyl estradiol) .... By mouth once daily 7)  Percocet 5-325 Mg Tabs (Oxycodone-acetaminophen) .Marland Kitchen.. 1 by mouth every 6 hours as needed 8)  Vitamin D (ergocalciferol) 50000 Unit Caps (Ergocalciferol) .Marland Kitchen.. 1 by mouth weekly 9)  Plaquenil 200 Mg Tabs (Hydroxychloroquine sulfate) .Marland Kitchen.. 1 by mouth once daily 10)  Diflucan 150 Mg Tabs (Fluconazole) .... Take 1 by mouth once daily x1, may repeat in 3 days as needed 11)  Avelox 400 Mg Tabs (Moxifloxacin hcl) .Marland Kitchen.. 1 by mouth once daily 12)  Ventolin Hfa 108 (90 Base) Mcg/act Aers  (Albuterol sulfate) .... 2 puffs qid as needed 13)  Advair Diskus 250-50 Mcg/dose Aepb (Fluticasone-salmeterol) .Marland Kitchen.. 1 inh bid 14)  Cheratussin Ac 100-10 Mg/23ml Syrp (Guaifenesin-codeine) .Marland Kitchen.. 1-2 tsp by mouth at bedtime as needed  Patient Instructions: 1)  Acute Bronchitis symptoms for less then 10 days are not  helped by antibiotics. Take over the counter cough medications. Call if no improvement in 5-7 days, sooner if increasing cough, fever, or new symptoms ( shortness of breath, chest pain) .  Prescriptions: CHERATUSSIN AC 100-10 MG/5ML SYRP (GUAIFENESIN-CODEINE) 1-2 tsp by mouth at bedtime as needed  #6 oz x 0   Entered and Authorized by:   Loreen Freud DO   Signed by:   Loreen Freud DO on 03/31/2010   Method used:   Print then Give to Patient   RxID:   [id number] AVELOX 400 MG TABS (MOXIFLOXACIN HCL) 1 by mouth once daily  #10 x 0   Entered and Authorized by:   Loreen Freud DO  Signed by:   Loreen Freud DO on 03/31/2010   Method used:   Electronically to        [facility] [employer] [phone]* (retail)       [address] [place], [place]  [address]       Ph: [phone] or [phone]       Fax: [phone]   RxID:   [id number]    Medication Administration  Injection # 1:    Medication: Depo- Medrol 80mg     Diagnosis: BRONCHITIS- ACUTE (ICD-466.0)    Route: IM    Site: RUOQ gluteus    Exp Date: 10/12/2010    Lot #: [id number]    Mfr: Pharmacia    Patient tolerated injection without complications    Given by: Almeta Monas CMA Duncan Dull) (March 31, 2010 3:41 PM)  Medication # 1:    Medication: Albuterol Sulfate Sol 1mg  unit dose    Diagnosis: BRONCHITIS- ACUTE (ICD-466.0)    Dose: 2.5mg /25ml    Route: inhaled    Exp Date: 10/12/2011    Lot #: [id number]    Mfr: nephron    Patient tolerated medication without complications    Given by: Almeta Monas CMA Duncan Dull) (March 31, 2010 3:42 PM)  Orders Added: 1)  Est. Patient Level IV [63016] 2)   T-2 View CXR [71020TC] 3)  Depo- Medrol 80mg  [J1040] 4)  Admin of Therapeutic Inj  intramuscular or subcutaneous [96372] 5)  Albuterol Sulfate Sol 1mg  unit dose [J7613] 6)  Nebulizer Tx [01093]  Appended Document: COUGH/RH........... cxr from [facility] was faxed-----+ ? pneumonia  we will repeat in 10-14 days or sooner as needed   Appended Document: COUGH/RH........... see phone note

## 2010-05-15 NOTE — Progress Notes (Signed)
Summary: Results 12/20,12/21  Phone Note Outgoing Call   Call placed by: Almeta Monas CMA Duncan Dull),  April 01, 2010 3:35 PM Call placed to: Patient Details for Reason: cxr from [facility] was faxed-----+ ? pneumonia  we will repeat in 10-14 days or sooner as needed   Summary of Call: Left message to call back... Almeta Monas CMA Duncan Dull)  April 01, 2010 3:35 PM Left message to call back.... Almeta Monas CMA Duncan Dull)  April 02, 2010 4:06 PM Pt aware of the above and stated she does feel better but will have the Cxr done next week.... Almeta Monas CMA Duncan Dull)  April 02, 2010 4:22 PM

## 2010-06-02 ENCOUNTER — Telehealth (INDEPENDENT_AMBULATORY_CARE_PROVIDER_SITE_OTHER): Payer: Self-pay | Admitting: *Deleted

## 2010-06-05 ENCOUNTER — Ambulatory Visit: Payer: Self-pay | Admitting: Family Medicine

## 2010-06-06 ENCOUNTER — Ambulatory Visit (INDEPENDENT_AMBULATORY_CARE_PROVIDER_SITE_OTHER): Payer: Managed Care, Other (non HMO) | Admitting: Family Medicine

## 2010-06-06 ENCOUNTER — Encounter: Payer: Self-pay | Admitting: Family Medicine

## 2010-06-06 DIAGNOSIS — IMO0001 Reserved for inherently not codable concepts without codable children: Secondary | ICD-10-CM

## 2010-06-06 DIAGNOSIS — L930 Discoid lupus erythematosus: Secondary | ICD-10-CM

## 2010-06-10 NOTE — Assessment & Plan Note (Signed)
Summary: discuss fibromyalgia and Lupus pain//fd   Vital Signs:  Patient profile:   35 year old female Weight:      156.6 pounds Pulse rate:   84 / minute Pulse rhythm:   regular BP sitting:   124 / 76  (left arm) Cuff size:   large  Vitals Entered By: Almeta Monas CMA Duncan Dull) (June 06, 2010 2:30 PM) CC: wants to discuss pain   History of Present Illness: Pt here to discuss pain with lupus and fibromyalgia.  She states last time it took 3 months on plaquenil before she had pain relief.  Her disability finally did come through.  Pt is happy about that.    Current Medications (verified): 1)  Levoxyl 50 Mcg Tabs (Levothyroxine Sodium) .... Take 1 Tab Once Daily 2)  Ambien 10 Mg Tabs (Zolpidem Tartrate) .Marland Kitchen.. 1 By Mouth Every At Bedtime As Needed Insomnia 3)  Prevacid 15 Mg Cpdr (Lansoprazole) .... Take 1 Capsule By Mouth Once A Day 4)  Klonopin 0.5 Mg Tabs (Clonazepam) .Marland Kitchen.. 1 By Mouth Two Times A Day 5)  Lexapro 10 Mg Tabs (Escitalopram Oxalate) .Marland Kitchen.. 1 By Mouth Once Daily 6)  Yaz 3-0.02 Mg Tabs (Drospirenone-Ethinyl Estradiol) .... By Mouth Once Daily 7)  Percocet 5-325 Mg Tabs (Oxycodone-Acetaminophen) .Marland Kitchen.. 1 By Mouth Every 6 Hours As Needed 8)  Vitamin D (Ergocalciferol) 50000 Unit Caps (Ergocalciferol) .Marland Kitchen.. 1 By Mouth Weekly 9)  Plaquenil 200 Mg Tabs (Hydroxychloroquine Sulfate) .Marland Kitchen.. 1 By Mouth Once Daily 10)  Ventolin Hfa 108 (90 Base) Mcg/act Aers (Albuterol Sulfate) .... 2 Puffs Qid As Needed 11)  Advair Diskus 250-50 Mcg/dose Aepb (Fluticasone-Salmeterol) .Marland Kitchen.. 1 Inh Bid  Allergies (verified): 1)  ! Prednisone 2)  ! Vicodin 3)  ! Compazine   Impression & Recommendations:  Problem # 1:  FIBROMYALGIA (ICD-729.1)  Her updated medication list for this problem includes:    Vicodin Es 7.5-750 Mg Tabs (Hydrocodone-acetaminophen) .Marland Kitchen... 1 by mouth every 6 hours as needed  Problem # 2:  LUPUS ERYTHEMATOSUS (ICD-695.4) If pt gets no pain relief in the 3 months we will refer  to pain management. Her updated medication list for this problem includes:    Plaquenil 200 Mg Tabs (Hydroxychloroquine sulfate) .Marland Kitchen... 1 by mouth once daily  Complete Medication List: 1)  Levoxyl 50 Mcg Tabs (Levothyroxine sodium) .... Take 1 tab once daily 2)  Ambien 10 Mg Tabs (Zolpidem tartrate) .Marland Kitchen.. 1 by mouth every at bedtime as needed insomnia 3)  Prevacid 15 Mg Cpdr (Lansoprazole) .... Take 1 capsule by mouth once a day 4)  Klonopin 0.5 Mg Tabs (Clonazepam) .Marland Kitchen.. 1 by mouth two times a day 5)  Lexapro 10 Mg Tabs (Escitalopram oxalate) .Marland Kitchen.. 1 by mouth once daily 6)  Yaz 3-0.02 Mg Tabs (Drospirenone-ethinyl estradiol) .... By mouth once daily 7)  Vicodin Es 7.5-750 Mg Tabs (Hydrocodone-acetaminophen) .Marland Kitchen.. 1 by mouth every 6 hours as needed 8)  Vitamin D (ergocalciferol) 50000 Unit Caps (Ergocalciferol) .Marland Kitchen.. 1 by mouth weekly 9)  Plaquenil 200 Mg Tabs (Hydroxychloroquine sulfate) .Marland Kitchen.. 1 by mouth once daily 10)  Ventolin Hfa 108 (90 Base) Mcg/act Aers (Albuterol sulfate) .... 2 puffs qid as needed 11)  Advair Diskus 250-50 Mcg/dose Aepb (Fluticasone-salmeterol) .Marland Kitchen.. 1 inh bid 12)  Vaniqa 13.9 % Crea (Eflornithine hcl) .... Apply two times a day Prescriptions: VANIQA 13.9 % CREA (EFLORNITHINE HCL) apply two times a day  #60 x 1   Entered and Authorized by:   Loreen Freud DO   Signed by:  Loreen Freud DO on 06/06/2010   Method used:   Print then Give to Patient   RxID:   [id number] VICODIN ES 7.5-750 MG TABS (HYDROCODONE-ACETAMINOPHEN) 1 by mouth every 6 hours as needed  #60 x 0   Entered and Authorized by:   Loreen Freud DO   Signed by:   Loreen Freud DO on 06/06/2010   Method used:   Print then Give to Patient   RxID:   [id number]    Orders Added: 1)  Est. Patient Level III [72536]

## 2010-06-10 NOTE — Progress Notes (Signed)
Summary: Wants pain meds-  Phone Note Call from Patient   Caller: Patient Call For: Loreen Freud DO Summary of Call: mssg from patient and she stated she needed to get an Rx for Vicodin due to her fibromyalgia and Lupus pain, Stated the hospt wait is currently 7 hours and she did not want to go in. c/b # [phone]..... Almeta Monas CMA Duncan Dull)  June 02, 2010 2:50 PM   Follow-up for Phone Call        pt would have to come in--- we have not given her vicodin. Follow-up by: Loreen Freud DO,  June 02, 2010 4:29 PM  Additional Follow-up for Phone Call Additional follow up Details #1::        Left message to call back.... Almeta Monas CMA Duncan Dull)  June 02, 2010 4:31 PM  Pt return call left message to call office...Marland KitchenMarland KitchenFelecia Deloach CMA  June 03, 2010 4:27 PM   Discuss with patient, OV scheduled..........Marland KitchenFelecia Deloach CMA  June 04, 2010 2:28 PM

## 2010-06-29 LAB — URINE MICROSCOPIC-ADD ON

## 2010-06-29 LAB — DIFFERENTIAL
Basophils Absolute: 0.1 10*3/uL (ref 0.0–0.1)
Basophils Absolute: 0.1 10*3/uL (ref 0.0–0.1)
Basophils Absolute: 0 10*3/uL (ref 0.0–0.1)
Basophils Relative: 0 % (ref 0–1)
Basophils Relative: 1 % (ref 0–1)
Basophils Relative: 0 % (ref 0–1)
Eosinophils Absolute: 0.1 10*3/uL (ref 0.0–0.7)
Eosinophils Absolute: 0.2 10*3/uL (ref 0.0–0.7)
Eosinophils Absolute: 0.2 10*3/uL (ref 0.0–0.7)
Eosinophils Relative: 0 % (ref 0–5)
Eosinophils Relative: 2 % (ref 0–5)
Eosinophils Relative: 3 % (ref 0–5)
Lymphocytes Relative: 11 % — ABNORMAL LOW (ref 12–46)
Lymphocytes Relative: 36 % (ref 12–46)
Lymphocytes Relative: 35 % (ref 12–46)
Lymphs Abs: 1.6 10*3/uL (ref 0.7–4.0)
Lymphs Abs: 3.4 10*3/uL (ref 0.7–4.0)
Lymphs Abs: 2.2 10*3/uL (ref 0.7–4.0)
Monocytes Absolute: 0.5 10*3/uL (ref 0.1–1.0)
Monocytes Absolute: 0.8 10*3/uL (ref 0.1–1.0)
Monocytes Absolute: 0.4 10*3/uL (ref 0.1–1.0)
Monocytes Relative: 5 % (ref 3–12)
Monocytes Relative: 6 % (ref 3–12)
Monocytes Relative: 7 % (ref 3–12)
Neutro Abs: 12.2 10*3/uL — ABNORMAL HIGH (ref 1.7–7.7)
Neutro Abs: 5.3 10*3/uL (ref 1.7–7.7)
Neutro Abs: 3.5 10*3/uL (ref 1.7–7.7)
Neutrophils Relative %: 56 % (ref 43–77)
Neutrophils Relative %: 83 % — ABNORMAL HIGH (ref 43–77)
Neutrophils Relative %: 55 % (ref 43–77)

## 2010-06-29 LAB — URINALYSIS, ROUTINE W REFLEX MICROSCOPIC
Bilirubin Urine: NEGATIVE
Bilirubin Urine: NEGATIVE
Bilirubin Urine: NEGATIVE
Bilirubin Urine: NEGATIVE
Glucose, UA: NEGATIVE mg/dL
Glucose, UA: NEGATIVE mg/dL
Glucose, UA: NEGATIVE mg/dL
Glucose, UA: NEGATIVE mg/dL
Ketones, ur: 40 mg/dL — AB
Ketones, ur: NEGATIVE mg/dL
Ketones, ur: NEGATIVE mg/dL
Leukocytes, UA: NEGATIVE
Nitrite: NEGATIVE
Nitrite: NEGATIVE
Nitrite: NEGATIVE
Nitrite: NEGATIVE
Protein, ur: 30 mg/dL — AB
Protein, ur: NEGATIVE mg/dL
Protein, ur: 100 mg/dL — AB
Protein, ur: NEGATIVE mg/dL
Specific Gravity, Urine: 1.024 (ref 1.005–1.030)
Specific Gravity, Urine: 1.028 (ref 1.005–1.030)
Specific Gravity, Urine: 1.011 (ref 1.005–1.030)
Specific Gravity, Urine: 1.035 — ABNORMAL HIGH (ref 1.005–1.030)
Urobilinogen, UA: 0.2 mg/dL (ref 0.0–1.0)
Urobilinogen, UA: 0.2 mg/dL (ref 0.0–1.0)
Urobilinogen, UA: 0.2 mg/dL (ref 0.0–1.0)
Urobilinogen, UA: 0.2 mg/dL (ref 0.0–1.0)
pH: 5.5 (ref 5.0–8.0)
pH: 5.5 (ref 5.0–8.0)
pH: 5.5 (ref 5.0–8.0)
pH: 5.5 (ref 5.0–8.0)

## 2010-06-29 LAB — CBC
HCT: 33.7 % — ABNORMAL LOW (ref 36.0–46.0)
HCT: 34.9 % — ABNORMAL LOW (ref 36.0–46.0)
HCT: 30.6 % — ABNORMAL LOW (ref 36.0–46.0)
Hemoglobin: 11 g/dL — ABNORMAL LOW (ref 12.0–15.0)
Hemoglobin: 11.5 g/dL — ABNORMAL LOW (ref 12.0–15.0)
Hemoglobin: 10.2 g/dL — ABNORMAL LOW (ref 12.0–15.0)
MCHC: 32.5 g/dL (ref 30.0–36.0)
MCHC: 32.9 g/dL (ref 30.0–36.0)
MCHC: 33.3 g/dL (ref 30.0–36.0)
MCV: 80.1 fL (ref 78.0–100.0)
MCV: 80.8 fL (ref 78.0–100.0)
MCV: 79.8 fL (ref 78.0–100.0)
Platelets: 286 10*3/uL (ref 150–400)
Platelets: 342 10*3/uL (ref 150–400)
Platelets: 288 10*3/uL (ref 150–400)
RBC: 4.17 MIL/uL (ref 3.87–5.11)
RBC: 4.36 MIL/uL (ref 3.87–5.11)
RBC: 3.84 MIL/uL — ABNORMAL LOW (ref 3.87–5.11)
RDW: 14.6 % (ref 11.5–15.5)
RDW: 14.6 % (ref 11.5–15.5)
RDW: 14.4 % (ref 11.5–15.5)
WBC: 14.7 10*3/uL — ABNORMAL HIGH (ref 4.0–10.5)
WBC: 9.4 10*3/uL (ref 4.0–10.5)
WBC: 6.3 10*3/uL (ref 4.0–10.5)

## 2010-06-29 LAB — URINE CULTURE
Colony Count: NO GROWTH
Culture: NO GROWTH

## 2010-06-29 LAB — BASIC METABOLIC PANEL WITH GFR
BUN: 12 mg/dL (ref 6–23)
BUN: 12 mg/dL (ref 6–23)
BUN: 6 mg/dL (ref 6–23)
CO2: 21 meq/L (ref 19–32)
CO2: 24 meq/L (ref 19–32)
CO2: 27 meq/L (ref 19–32)
Calcium: 8.8 mg/dL (ref 8.4–10.5)
Calcium: 9.3 mg/dL (ref 8.4–10.5)
Calcium: 9.1 mg/dL (ref 8.4–10.5)
Chloride: 105 meq/L (ref 96–112)
Chloride: 107 meq/L (ref 96–112)
Chloride: 105 meq/L (ref 96–112)
Creatinine, Ser: 0.78 mg/dL (ref 0.4–1.2)
Creatinine, Ser: 1.04 mg/dL (ref 0.4–1.2)
Creatinine, Ser: 1.12 mg/dL (ref 0.4–1.2)
GFR calc non Af Amer: >60 mL/min
GFR calc non Af Amer: >60 mL/min
GFR calc non Af Amer: 57 mL/min — ABNORMAL LOW
Glucose, Bld: 104 mg/dL — ABNORMAL HIGH (ref 70–99)
Glucose, Bld: 95 mg/dL (ref 70–99)
Glucose, Bld: 127 mg/dL — ABNORMAL HIGH (ref 70–99)
Potassium: 4.2 meq/L (ref 3.5–5.1)
Potassium: 4.2 meq/L (ref 3.5–5.1)
Potassium: 3.7 meq/L (ref 3.5–5.1)
Sodium: 136 meq/L (ref 135–145)
Sodium: 139 meq/L (ref 135–145)
Sodium: 139 meq/L (ref 135–145)

## 2010-06-29 LAB — POCT I-STAT, CHEM 8
BUN: 10 mg/dL (ref 6–23)
Calcium, Ion: 1.07 mmol/L — ABNORMAL LOW (ref 1.12–1.32)
Chloride: 105 meq/L (ref 96–112)
Creatinine, Ser: 1.4 mg/dL — ABNORMAL HIGH (ref 0.4–1.2)
Glucose, Bld: 132 mg/dL — ABNORMAL HIGH (ref 70–99)
HCT: 30 % — ABNORMAL LOW (ref 36.0–46.0)
Hemoglobin: 10.2 g/dL — ABNORMAL LOW (ref 12.0–15.0)
Potassium: 4 meq/L (ref 3.5–5.1)
Sodium: 137 meq/L (ref 135–145)
TCO2: 24 mmol/L (ref 0–100)

## 2010-06-29 LAB — POCT PREGNANCY, URINE: Preg Test, Ur: NEGATIVE

## 2010-06-29 LAB — PREGNANCY, URINE: Preg Test, Ur: NEGATIVE

## 2010-07-07 ENCOUNTER — Telehealth: Payer: Self-pay | Admitting: *Deleted

## 2010-07-07 ENCOUNTER — Ambulatory Visit (INDEPENDENT_AMBULATORY_CARE_PROVIDER_SITE_OTHER): Payer: Managed Care, Other (non HMO) | Admitting: Family Medicine

## 2010-07-07 ENCOUNTER — Encounter: Payer: Self-pay | Admitting: *Deleted

## 2010-07-07 VITALS — BP 112/70 | Temp 98.2°F | Ht 60.0 in | Wt 146.0 lb

## 2010-07-07 DIAGNOSIS — T304 Corrosion of unspecified body region, unspecified degree: Secondary | ICD-10-CM | POA: Insufficient documentation

## 2010-07-07 DIAGNOSIS — T300 Burn of unspecified body region, unspecified degree: Secondary | ICD-10-CM

## 2010-07-07 DIAGNOSIS — IMO0002 Reserved for concepts with insufficient information to code with codable children: Secondary | ICD-10-CM
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
## 2010-07-07 NOTE — Patient Instructions (Signed)
At this point your burn is not infected so there isn't any need for antibiotic cream Continue the burn ointment or Vit E cream to keep area moist and prevent scarring Wash w/ gentle cleanser to prevent pain, burning, and overdrying This should heal quickly- about a week or so- but please be aware of increased redness, drainage, pus, etc.  If you develop any of these symptoms, please call Hang in there!
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
## 2010-07-07 NOTE — Telephone Encounter (Signed)
Left message to call office.Felecia Maryl Blalock CMA   

## 2010-07-11 ENCOUNTER — Telehealth: Payer: Self-pay | Admitting: *Deleted

## 2010-07-11 NOTE — Telephone Encounter (Signed)
Can apply Vit E cream in thin layer now that area looks like sunburn.  No need to continue burn cream.

## 2010-07-11 NOTE — Telephone Encounter (Signed)
Discuss with patient  
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
## 2010-07-20 LAB — POCT PREGNANCY, URINE: Preg Test, Ur: NEGATIVE

## 2010-07-23 DIAGNOSIS — Z0279 Encounter for issue of other medical certificate: Secondary | ICD-10-CM

## 2010-08-26 NOTE — Assessment & Plan Note (Signed)
[facility]                                 ON-CALL NOTE   NAME:Donna Haas, Donna Haas                        MRN:          [id number]  DATE:12/01/2007                            DOB:          Sep 13, 1975    DATE OF INTERACTION:  December 01, 2007 at 9:05 p.m.   PHONE NUMBER:  [phone]   OBJECTIVE:  The patient has an eye infection with head pain.  He was  seen on Tuesday by Dr. Artist Pais at the [facility] even though,  is a patient of  [facility].  She was busy and could not see her.  She was  diagnosed with a staph infection in the eye and was put on Ceftin and  has now had six doses, cannot sleep, has nausea, headache and symptoms  have not improved.  I told her that she really needs to be seen at the  ER, may very well need a CT scan or a change in antibiotic.   Primary care Thomas Rhude is Dr. Laury Axon and his home office is [place].     Arta Silence, MD  Electronically Signed    RNS/MedQ  DD: 12/01/2007  DT: 12/02/2007  Job #: [id number]

## 2010-08-26 NOTE — Assessment & Plan Note (Signed)
[facility]                                 ON-CALL NOTE   NAME:SENICLatorsha, Curling                          MRN:          [id number]  DATE:07/18/2007                            DOB:          [birth year]    Caller is [name] on July 18, 2007 at 1:42 a.m.  Date of birth is  09/09/1975.  Phone number is [phone].   I spoke with Josh.  He said that Donna Haas is having nausea, a racing heart,  difficulty breathing, with spasms in her abdomen and her side hurts too.  He thinks she may be having a reaction to new medicine called HyoMax,  but she has actually been on that a week with no problems.  She has  already had an appendectomy, so he does not think it is appendicitis.  She may have some gallbladder issues.  They are not quite sure.  She  feels clammy.  She also takes Ambien, Darvocet, and Prilosec.   I told him that I recommended they take her to the emergency room for  evaluation of this.  He said that she may try to vomit and if she vomits  and feel better, they will not go.  I did still recommend that they go.  They did call back later in the night.  He said that she was able to  vomit but is still feeling just the same.  In fact her side pain is a  little worse.  He said that they cannot go to the emergency room because  they have a child that they cannot take with them.  She cannot drive  herself.  I recommended they call EMS to take her to the hospital given  she is feeling this bad.  He says he is not sure whether they will do  this or not and he said that if they decide not to they will call the  office for followup in the morning.     Marne A. Tower, MD  Electronically Signed    MAT/MedQ  DD: 07/18/2007  DT: 07/18/2007  Job #: [id number]

## 2010-08-26 NOTE — Assessment & Plan Note (Signed)
[facility]                                 ON-CALL NOTE   NAME:Haas, Donna                          MRN:          [id number]  DATE:05/30/2007                            DOB:          10-25-1975    DATE OF INTERACTION:  May 30, 2007, at 7:03 p.m.   PHONE NUMBER:  [phone].   OBJECTIVE:  Patient has been diagnosed with gallbladder disease  recently.  Had nausea and severe pain that resolved.  Was actually seen  by Dr. Laury Axon today and had no problems at the time she was being seen.  Dr. Laury Axon is pretty sure she has gallbladder disease.  However, now she  has started developing nausea and severe pain again.  Two weeks ago she  had back pain with nausea, was seen in the emergency room.  Not sure if  she had visualization of her gallbladder, but she had elevated liver  functions consistent with cholecystitis.  Wanted to know what she should  do.   OBJECTIVE:  Presumed cholecystitis.   PLAN:  I spent a long time discussing all the ins and outs of  gallbladder disease and treatment regimens, possible things that need to  be done and not done and told her basically if her pain continues for a  prolonged period of time, at a level that she cannot handle, she  probably ought to be seen in the emergency room to be evaluated by  Surgery if needed.  Otherwise, to try and hold on as much as possible  and let the cholecystitis settle down and cool down so that surgery can  be done if it is needed.   PRIMARY CARE [name]:  Dr. Laury Axon.   HOME OFFICE:  [name].     Arta Silence, MD  Electronically Signed    RNS/MedQ  DD: 05/30/2007  DT: 05/31/2007  Job #: [id number]

## 2010-08-26 NOTE — Assessment & Plan Note (Signed)
[facility]                                 ON-CALL NOTE   NAME:SENICGracelin, Weisberg                          MRN:          [id number]  DATE:05/23/2007                            DOB:          10-14-1975    PRIMARY CARE PHYSICIAN:  Lelon Perla, DO   Ms. Donna's husband called in stating that  his wife, Donna Haas, is  suddenly complaining of chest pain radiating into her left arm and neck.  He reports that she has a history of lupus.  She was seen two weeks ago  at the emergency department for gallbladder flare up.  He does not  know if this is related.  I advised the husband to give his wife a whole  aspirin if there are no contraindications, and to call 911 immediately  given the chest pain radiating into her arm and left neck.  Cardiac  event needs to be ruled out.  Husband expressed understanding.  He will  be calling 911 after he gets off the phone with me.     Leanne Chang, M.D.  Electronically Signed    LA/MedQ  DD: 05/23/2007  DT: 05/24/2007  Job #: [id number]

## 2010-08-26 NOTE — Procedures (Signed)
Donna Haas, Donna Haas                 ACCOUNT NO.:  [id number]   MEDICAL RECORD NO.:  [id number]          PATIENT TYPE:  OUT   LOCATION:  SLEEP CENTER                 FACILITY:  [facility]   PHYSICIAN:  Barbaraann Share, MD,FCCPDATE OF BIRTH:  April 15, 1975   DATE OF STUDY:  07/31/2007                            NOCTURNAL POLYSOMNOGRAM   REFERRING PHYSICIAN:  Barbaraann Share, MD,FCCP   INDICATION FOR STUDY:  Hypersomnia with sleep apnea.   EPWORTH SLEEPINESS SCORE:  Two.   SLEEP ARCHITECTURE:  The patient had a total sleep time of 203 minutes  with decreased slow-wave sleep for age as well as REM.  Sleep onset  latency was very prolonged at 258 minutes, and REM onset was normal.  Sleep efficiency was very poor at 44%.   RESPIRATORY DATA:  The patient was found to have 4 obstructive apneas  and 15 hypopneas for an apnea/hypopnea index of 5 events per hour.  She  was also found to have 13 RERAs giving her a respiratory disturbance  index of 9 events per hour.  The events occurred more commonly in the  supine position, and snoring was present but not quantified by the sleep  technician.   OXYGEN DATA:  There was O2 desaturation as low as 84% with the patient's  obstructive events.   CARDIAC DATA:  No clinically significant arrhythmias were noted.   MOVEMENT/PARASOMNIA:  There were no abnormal leg jerks or behaviors  noted.   IMPRESSION/RECOMMENDATION:  Mild obstructive sleep apnea/hypopnea  syndrome with an apnea/hypopnea index of 5 events per hour and a  respiratory disturbance index of 9 events per hour.  There was O2  desaturation as low as 84%.  Treatment for this degree of sleep apnea  can include weight loss alone if applicable, upper airway surgery, oral  appliance, and also continuous positive airway pressure.  Clinical  correlation is suggested.      Barbaraann Share, MD,FCCP  Diplomate, American Board of Sleep  Medicine  Electronically Signed     KMC/MEDQ  D:   09/01/2007 19:42:17  T:  09/01/2007 20:15:50  Job:  [id number]

## 2010-08-26 NOTE — Assessment & Plan Note (Signed)
[facility]                         GASTROENTEROLOGY OFFICE NOTE   NAME:Haas, Donna DEANGELO                        MRN:          [id number]  DATE:07/04/2007                            DOB:          23-Feb-1976    CHIEF COMPLAINT/REASON FOR CONSULTATION:  1. Abdominal pain.  2. Fatty liver.   REQUESTING PHYSICIAN:  Dr. Laury Axon   ASSESSMENT:  A 35 year old white woman with an apparent history of lupus  who has right upper quadrant pain.  She is tender in the right upper  quadrant but also has bilateral rib tenderness.  I think these are  probably two separate issues.  She has an abdominal ultrasound that is  unrevealing, so the possibility of biliary dyskinesia exists since much  of this pain is postprandial.  Chronic cholecystitis I suppose could be  a problem as well.  Also, she has a history of gastroesophageal reflux  disease and a questionable history of Barrett's esophagus with previous  endoscopy (per patient) about 10 years ago and that may be involved as  well.  I also think irritable bowel syndrome is quite likely.   PLAN:  1. HIDA scan with ejection fraction.  2. Esophagogastroduodenoscopy to assess gastroesophageal reflux      disease and pain.  3. Levsin SL generic 1-2 q. 4 hours p.r.n. pain.  She has used this in      the past for irritable bowel syndrome and that has helped.  4. Note:  She has some history of scanty bright red blood on the      tissue paper.  She has Hemoccult negative stool today and some      small hemorrhoids noted at rectal exam.  She had a colonoscopy 10      years ago she says that was unremarkable for similar indications.      I think she probably has irritable bowel or related diarrhea      symptoms and anorectal bleeding and do not plan on any other work      up for that at this time.   HISTORY:  A 35 year old white woman with problems as outlined above.  She had a diagnosis of lupus made mainly with bone and  joint complaints  and followed at [facility].  Her husband was laid off 3 times last year and  there were some issues with continuity of care due to health insurance  issues.  She has come to see Dr. Laury Axon after she was seen at [facility] post miscarriage.  At that time in January, she was about 2  weeks after miscarriage and she had some abnormal transanimations.  She  had 3 to 6 red cells and a large amount of hemoglobin in the urine.  A  CT scan without contrast showed fatty liver but no other abnormalities.  She is describing a postprandial abdominal pain which is episodic and  intermittent.  She has nausea with this.  She has GERD and was put on  omeprazole, though it is not clear how much that is helping at this  time.  She said about  10 years ago she was evaluated in [place] and  had an EGD and it was thought that she might have Barrett's esophagus  and then there was no follow up because the doctor that did that  procedure left the practice.  She also had a colonoscopy by a different  physician, she tells me she was Okay; that was for diarrhea problems.  On February 16, she saw Dr. Laury Axon, an ultrasound was ordered and it was  unrevealing except for fatty liver.  She has persistent transaminitis  with an ALT of 74, AST 58 but normal bilirubin and alkaline phosphatase  and a normal acute hepatitis panel.  Her GGT is 151.  Her ANA was  negative.  Appointment with Dr. Shelle Iron and Dr. Kellie Simmering were requested.   FAMILY HISTORY:  Pertinent for her mother with either ulcerative colitis  or Crohn's disease.   REVIEW OF SYSTEMS:  She has gained 60 pounds in 3 years and says she was  petite and athletic and does not understand where this weight gain came  from.  She has multiple joint pains, muscle pains, pain with period,  insomnia.  She says she has been anemic.  The recent labs have not shown  that.  Review of systems otherwise negative or as mentioned in my  medical history  form.   MEDICATIONS:  1. Ambien 10 mg at bedtime.  2. Prilosec OTC 20 mg daily.  3. Clonazepam 0.5 mg daily.  4. Darvocet p.r.n.  5. Zanaflex p.r.n.   DRUG ALLERGIES:  PREDNISONE APPARENTLY CAUSED ANAPHYLAXIS.   PAST MEDICAL HISTORY:  1. Dyslipidemia.  2. Lupus.  3. Gastroesophageal reflux disease.  4. Endometriosis.  5. Chronic back pain.  6. Anxiety/depression.   PAST SURGICAL HISTORY:  1. Appendectomy.  2. Thyroid cyst.   FAMILY HISTORY:  As above.   SOCIAL HISTORY:  She is married.  College education.  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  She has a 77-year-old daughter.  No alcohol, tobacco or drugs.   PHYSICAL EXAMINATION:  Reveals a well-developed, obese young white  woman.  Height 5 feet.  Weight 195 pounds.  There is a Higher education careers adviser piercing  otherwise.  EYES:  Anicteric.  ENT:  Posterior pharynx free of lesions.  NECK:  Supple without thyromegaly or mass.  CHEST:  Clear.  HEART:  S1 S2.  There are no murmurs, rubs or gallops.  ABDOMEN:  Tender in the right upper quadrant to deep palpation without  organomegaly or mass.  Bowel sounds are present.  There are no bruits  present.  LYMPHATIC:  No neck or supraclavicular nodes.  MUSCULOSKELETAL:  The ribs are tender in the lateral anterior and  posterior aspects of both right and left side.  Does not seem to be a  sternal problem.  Her abdominal pain improves with muscle tension  actually.  There are multiple tattoos in the skin.  NEUROLOGIC:  She is alert and oriented x3.  Cranial nerves II through  XII are intact.  PSYCHIATRIC:  Appropriate mood and affect.   Note:  It does sound like she has irritable bowel syndrome.  We will  have to keep in mind the possibility of inflammatory bowel disease as  well.  Would not repeat a colonoscopy at this time, but her mother  apparently has some form of inflammatory bowel disease, and that is in  the differential though this sounds more like an irritable bowel  phenomenon with respect  to the GI symptoms.  We will  follow up depending  upon the results of the above work up.     Iva Boop, MD,FACG  Electronically Signed    CEG/MedQ  DD: 07/04/2007  DT: 07/04/2007  Job #: [id number]   cc:   Lelon Perla, DO  Aundra Dubin, M.D.

## 2010-08-26 NOTE — Op Note (Signed)
Donna Haas, Donna Haas                 ACCOUNT NO.:  [id number]   MEDICAL RECORD NO.:  [id number]          PATIENT TYPE:  AMB   LOCATION:  SDS                          FACILITY:  [facility]   PHYSICIAN:  Cherylynn Ridges, M.D.    DATE OF BIRTH:  Oct 05, 1975   DATE OF PROCEDURE:  09/02/2007  DATE OF DISCHARGE:  09/02/2007                               OPERATIVE REPORT   PREOPERATIVE DIAGNOSIS:  Biliary dyskinesia with symptoms.   POSTOPERATIVE DIAGNOSIS:  Biliary dyskinesia with symptoms, most likely  chronic cholecystitis.   PROCEDURE:  Laparoscopic cholecystectomy with cholangiogram.   SURGEON:  Marta Lamas. Lindie Spruce, MD.   ANESTHESIA:  General endotracheal.   ESTIMATED BLOOD LOSS:  Less than 50 mL.   COMPLICATIONS:  None.   CONDITION:  Stable.   FINDINGS:  Abnormal cholangiogram.  At the periumbilical site when  trocar was removed, she had some bleeding which was controlled with a  figure-of-eight stitch.  She has some lower abdominal adhesions from  previous surgery.  No other abnormalities noted.   INDICATIONS FOR OPERATION:  The patient is a 35 year old with symptoms  from biliary dyskinesia, a moderately low ejection fraction, who comes  in now for a laparoscopic cholecystectomy.   OPERATION:  The patient was taken to the operating room, placed on table  in supine position.  After an adequate general endotracheal anesthetic  was administered, she was prepped and draped in the usual sterile manner  and exposed in the midline in the right upper quadrant.   A supraumbilical curvilinear incision was made using #11 blade and  taking down to the midline fascia.  We grabbed the fascia with Kocher  clamps and incised the fascia between the Kochers with the #15 blade  into the preperitoneal space.  We grabbed the edges of the fascia with  Kocher clamps and dissected down bluntly into the peritoneal cavity with  Kelly clamps.  Once this was done, a purse-string suture of 0 Vicryl was  passed  around the fascial opening and then a Hasson cannula was passed  which secured in place with a purse-string suture.  We then insufflated  carbon dioxide gas up to a maximal intra-abdominal pressure of 15 mmHg  up to the Hasson cannula.  The patient was placed in reverse  Trendelenburg, the left side was tilted down.   Two right upper quadrant 5-mm cannulas and a subxiphoid 11-mm cannula  passed under direct vision.  Once all cannulas were in place, we grabbed  the gallbladder with a grasper and retracted towards right upper  quadrant and the anterior abdominal wall.  A second grasper was placed  on the infundibulum.  After some mild adhesions onto the body of the  gallbladder with omentum were taken down with electrocautery and blunt  dissection.   We were able to expose the peritoneum overlying the triangle of Calot  and hepatoduodenal triangle.  We dissected out the cystic duct and the  cystic artery.  The cystic duct was clipped along the gallbladder side.  Then a cholecystodochotomy made using a laparoscopic scissors.  A Adriana Simas  catheter was passed through the anterior abdominal wall and then into  the cholecystodochotomy which allowed [place] to perform cholangiogram.  It  showed good flow into the duodenum.  No intraductal filling defects.  No  biliary ductal dilatation.  No extravasation.   Once the cholangiogram was completed, we removed the clips secured them  in place, removed the catheter then clipped the cystic duct distally x3.  We transected the cystic duct then transected the cystic artery after  clipping proximally and distally.  We dissected the gallbladder out of  its bed with minimal difficulty, retrieving it from the supraumbilical  site using an Endocatch bag.  We inspected for bleeding, there was  minimal bleeding or bowel staining.  There was a tear of the liver  capsule during irrigation which was cauterized with no further bleeding.  Again, the periumbilical bleeding  was controlled with figure-of-eight  stitch after the purse-string suture was tied.   After all cannulas were removed, we had aspirated all fluid and gas from  about the liver.  We closed the incisions at the supraumbilical and  subxiphoid site using running subcuticular suture of 4-0 Vicryl.  We  injected 0.25%  Marcaine with epinephrine at all sites.  Sterile  dressing was applied including Dermabond and Steri-Strips.  The latter  cannula sites were closed primarily with Dermabond.  Tegaderm was also  used for closure.  All needle counts, sponge counts, and instrument  counts were correct.      Cherylynn Ridges, M.D.  Electronically Signed     JOW/MEDQ  D:  09/02/2007  T:  09/02/2007  Job:  [id number]

## 2010-08-29 NOTE — Discharge Summary (Signed)
Donna Haas, Donna Haas                 ACCOUNT NO.:  [id number]   MEDICAL RECORD NO.:  [id number]          PATIENT TYPE:  INP   LOCATION:  9131                          FACILITY:  [facility]   PHYSICIAN:  Randye Lobo, M.D.   DATE OF BIRTH:  Jan 16, 1976   DATE OF ADMISSION:  08/21/2005  DATE OF DISCHARGE:  08/23/2005                                 DISCHARGE SUMMARY   FINAL DIAGNOSIS:  Status post cesarean section on Aug 11, 2005.  Endometritis.   COMPLICATIONS:  None.   This 35 year old G2, P1-0-1-1 presents on Aug 21, 2005, presents with fever  of 101.5 at home, uterine tenderness.  She was seen in the office, diagnosed  with endometritis, and started on Augmentin 500 mg b.i.d.  This was done in  the office on the morning of the 11th.  That evening, the patient developed  temperatures of 103-104 with increasing lower abdominal pain.  She was sent  to the [facility] for evaluation and admission at this time.  The  patient was currently on her Augmentin, some Tylenol, prenatal vitamins,  iron supplement, and some Prevacid at this time.  She had a temperature of  103.  Blood pressure within normal limits.  Lungs were clear to  auscultation.  The patient did have mild lochia, and the uterus was about 16  weeks' size and tender.  Her CBC showed a white blood cell count of 15.2,  and a urinalysis was negative.  She was admitted at this time.  Blood  cultures times two were obtained.  She was started on IV ampicillin,  gentamicin, and clindamycin, and on these medications the patient was  feeling much better.  Her temperature dropped to 99, and her white blood  cell count dropped to 9.2, which was down which was down from 15.2.  By  hospital day number two, the patient continued feeling better.  Her uterus  was minimally tender.  She was afebrile for about 30 hours, and she was felt  ready for discharge.  She was sent home on a regular diet, told to decrease  her activities, told to finish  her Augmentin 500 mg b.i.d. for one more  week, was to follow up in the office in five days.  Requested to call with  any increased fevers or bleeding or pain.   LABORATORIES ON DISCHARGE:  The patient had a hemoglobin of 10.3, a white  blood cell count that had decreased all the way to 6.7, and platelets of  300,000.      Leilani Able, P.A.-C.      Randye Lobo, M.D.     MB/MEDQ  D:  09/24/2005  T:  09/24/2005  Job:  [id number]

## 2010-08-29 NOTE — H&P (Signed)
Donna Haas, WUELLNER                 ACCOUNT NO.:  [id number]   MEDICAL RECORD NO.:  [id number]          PATIENT TYPE:  EMS   LOCATION:  MAJO                         FACILITY:  [facility]   PHYSICIAN:  Trudi Ida. Denton Lank, M.D.  DATE OF BIRTH:  1976-03-07   DATE OF ADMISSION:  11/03/2004  DATE OF DISCHARGE:                                HISTORY & PHYSICAL   ADDENDUM TO A T-SYSTEM CHART:   TIME OF REEVALUATION:  8:40 a.m.   The patient had presented initially on the shift prior to mine with multiple  complaints, pain all over her body from her head to her feet.  She  complained of bilateral extremity numbness and weakness such to the extent  that she was unable to stand or support her weight.  There was no neck or  back radicular type of pain.  There was no history of trauma or fall.  Patient denied fever, chills, or sweats.  No abrupt change in her  medication.  No recent change or discontinuation of steroid medication.  She  did indicate a history of lupus.  Patient initially had a nonfocal  neurologic examination.  She did have complaint of pain all over; however,  there was not found to be any one area or location on her examination of  focal tenderness, swelling, or other overt physical abnormality.  The  patient had complained of urinary frequency.  Did have initial urinalysis  and other blood work sent.  The initial UA did have 3-6 white cells;  however, epithelial cells were also noted.  The blood work largely  unremarkable.  The patient had a hemoglobin of 9.5, white count normal at  10.4, no left shift.  There was no recent or prior laboratory work to  compare this with.  Patient appeared to have a microcytic anemia with a low  MCV.  Patient's BUN/creatinine, renal function are normal, her other  chemistries and CMET findings normal.  The patient's vital signs on initial  check and on repeat were completely normal.  There was no fever.  Normal  pulse ox.  I had ordered Vicodin two  tablets for pain and had gone into the  room to discuss laboratory results with patient.  At this point or shortly  thereafter the patient had hopped off the stretcher, gone running from the  room with no apparent weakness or inability to use her legs normally.  This  was in contrast to previously noted notes on computer from EMTs whereby  patient was refusing to stand or bear weight on attempting to get prior  urinalysis.  The patient and spouse became very insulting, argumentative,  feeling that the severity of her illness had not been fully explored.  They  expressed outrage at the treatment by the tech prior.  They also stated  disbelief that they were put in a location next door to a patient who had  complained of a sore throat stating that what kind of a facility would put  someone with lupus next to someone with a sore throat.  Also complained that  no  EKG was done, that she could be having a heart attack.  Nursing staff did  do an EKG which showed no acute ST or T-wave changes.  In addition to that,  the patient, although initial complaint was pain all over, had not in any  way specifically complained of chest discomfort.  Patient then expressed  disbelief on the UA findings saying that there were clumps of material and  clots in her urine.  At this point on review of urine I did discuss that it  was a suboptimal specimen in that there were epithelial cells noted, that it  may have not been an adequate sample.  A repeat catheterized UA was done  which was negative.  The patient had appeared comfortable throughout her  stay in the emergency department.  As a possible explanation for the earlier  bilateral extremity numbness I had suggested possibly hyperventilation.  However, I did explain to them that ever since my examination the patient  had been calmer and not hyperventilating and, in fact, had no residual leg  numbness or weakness.  I also did discuss with them the idea of  close  primary care follow-up.  Patient indicated it had been some months since she  had seen her primary care physician.  She indicates he had been [place] of [place] since that time.  When I suggested close follow-up with him she  indicated she had plans to see a new specialist in the upcoming month, the  reasons for which were unclear.  The patient and spouse seemed unhappy with  any explanation of her symptoms and of their overall experience in the ED.  Nursing did talk with them at length and eventually they  indicated that they wanted no further contact with myself.  That request was  subsequently honored.  Patient is discharged to follow up closely with  primary care physician, to return to ER if worse fevers or should they have  any other concern.       KES/MEDQ  D:  11/03/2004  T:  11/03/2004  Job:  [id number]

## 2010-08-29 NOTE — Assessment & Plan Note (Signed)
[facility]                                 ON-CALL NOTE   NAME:Haas, Donna RIEL                        MRN:          [id number]  DATE:12/24/2007                            DOB:          [birth year]    The patient of mine, her husband [name] called from [phone] at  10:42 p.m. on December 23, 2007, stating that because of the severe  pain and friction in her arm that her Ambien had been knocked down the  sink and she was out, and needed a refill.  At 10 a.m. the Ambien was  called in to the pharmacy.  The pharmacist called back at 11:48 p.m. on  December 23, 2006, stating that she had for the last 4 refills called  in 7-10 days early each time.  The pharmacist was informed not to refill  the prescription.  [name]'s husband, [name] was called back to explain the  situation.  He said he did not understand how that could be, but I  explained that I could not refill the prescription because they refilled  it early in the last 4 times.  He understood.  I will call the office on  Monday.  Her husband will call the [employer] on Monday to try to  straighten out the situation.     Lelon Perla, DO  Electronically Signed    Shawnie Dapper  DD: 12/24/2007  DT: 12/24/2007  Job #: [id number]

## 2010-08-29 NOTE — Discharge Summary (Signed)
Donna Haas, Donna Haas                 ACCOUNT NO.:  [id number]   MEDICAL RECORD NO.:  [id number]          PATIENT TYPE:  INP   LOCATION:  9133                          FACILITY:  [facility]   PHYSICIAN:  Malva Limes, M.D.    DATE OF BIRTH:  09-29-1975   DATE OF ADMISSION:  08/11/2005  DATE OF DISCHARGE:  08/14/2005                                 DISCHARGE SUMMARY   FINAL DIAGNOSES:  1.  Intrauterine pregnancy at [redacted] weeks gestation.  2.  Pregnancy-induced hypertension.  3.  Nonreassuring fetal heart tracing.  4.  Delivery of a female infant with lupus cord about the body and leg.   PROCEDURE:  Primary low flap transverse cesarean section.   SURGEON:  Miguel Aschoff, M.D.   COMPLICATIONS:  None.   HOSPITAL COURSE:  This 35 year old G2, P0-0-1-0 presents at 38-weeks  gestation.  She had been seen in the office that morning and noted to have  some elevated blood pressures in the 160/100 range.  She was 2 centimeters  dilated, 80% effaced at this time and was sent to the [facility] for  induction of labor.  The patient's antepartum course up to this point had  been complicated by a history of bipolar disorder.  I do not see that she is  on any medications for that at this time.  The patient also had started with  a twin gestation which one of the twins with lost at 8 weeks.  This was a  Clomid pregnancy and the patient also has a history of lupus but is  currently in remission.  Otherwise, the antepartum course has been  uncomplicated.  She did have a negative group B strep culture obtained in  the office at 36 weeks.  She was admitted at this time, like I said, for  induction secondary to [facility].  During the induction, the patient had an  epidural placed and scalp electrodes.  There was bradycardia noted to the 76-  78 range with no signs of recovery.  Position change and oxygen were tried  but there was no change at this point.  The patient was taken immediately  for cesarean section.   The patient was taken to the operating room by Dr.  Miguel Aschoff where a primary low flap transverse cesarean section was  performed at the delivery of a 5 pound 2 ounce female infant with Apgars of  8 and 9.  Delivery went without complications.  The patient's postoperative  course was benign without any significant fevers.  She did have some mild  postoperative anemia.  She was started on iron during her hospital stay.  She was about ready for discharge on postoperative day #3.  She was sent  home on a regular diet, told to decrease her activities, told to continue  her prenatal vitamins and an iron supplement, was given  Darvocet 1-2 every four hours as needed for pain.  She was to return to the  office in three days which was Monday for a blood pressure check.  Labs on  discharge include the patient having a  hemoglobin of 9.7, white blood cell  count of 11.8 and platelets of 202,000.      Leilani Able, P.A.-C.    ______________________________  Malva Limes, M.D.    MB/MEDQ  D:  09/24/2005  T:  09/24/2005  Job:  [id number]

## 2010-08-29 NOTE — Op Note (Signed)
Haas, Donna                 ACCOUNT NO.:  [id number]   MEDICAL RECORD NO.:  [id number]          PATIENT TYPE:  INP   LOCATION:  9133                          [facility]ACILITY[facility]  [facility]   PHYSICIAN:  Miguel Aschoff, M.D.       DATE OF BIRTH:  1975-06-13   DATE OF PROCEDURE:  08/11/2005  DATE OF DISCHARGE:                                 OPERATIVE REPORT   PREOPERATIVE DIAGNOSIS:  Intrauterine pregnancy at 38 weeks, pregnancy  induced hypertension, nonreassuring fetal heart tracing.   POSTOPERATIVE DIAGNOSIS:  Intrauterine pregnancy at 38 weeks, pregnancy  induced hypertension, nonreassuring fetal heart tracing.  Delivery of viable  female infant, Apgar 8 and 9 with loops of cord about the body and leg.   OPERATION PERFORMED:  Primary low flap transverse cesarean section.   SURGEON:  Miguel Aschoff, M.D.   ANESTHESIA:  Epidural.   COMPLICATIONS:  None.   INDICATIONS FOR PROCEDURE:  The patient is a 35 year old white female at 38  weeks noted to be hypertensive in the office with blood pressures in the  160/100 range.  On examination, she was noted to be 2 cm 80% effaced and  brought to the hospital for induction of labor for pregnancy induced  hypertension.   DESCRIPTION OF PROCEDURE:  The patient had an epidural anesthetic placed.  Following the epidural, a fetal scalp electrode was placed and the patient  was noted to be having bradycardia in the 76 to 88 range with no sign of  recovery.  After observing this for two to three minutes with changes of  position and administration of oxygen, it was felt that cesarean section was  immediately indicated and the patient was brought to the operating room for  this procedure.   The patient was brought to the operating room, placed in supine position,  her epidural anesthetic reached a satisfactory level of anesthesia for a  cesarean section.  A Foley catheter was inserted.  A Pfannenstiel incision  was then made, extending down through  subcutaneous tissue with bleeding  points being clamped and coagulated as they were encountered.  The fascia  was then identified and incised transversely and then separated from the  underlying rectus muscles.  Rectus muscles were divided in the midline.  The  peritoneum was then found and entered carefully avoiding underlying  structures.  At this point a bladder flap was created and protected with a  bladder blade.  Then elliptical transverse incision was made into the lower  uterine segment.  The amniotic cavity was entered.  Clear fluid was  obtained.  At this point the patient was delivered of viable female infant,  Apgars 8 at one minute 9 at five minutes from the vertex LOA position.  There were two loops of cord noted around the body as well as two loops of  cord noted around the leg.  It was felt that this was responsible for the  episode of bradycardia.  Cord pH was obtained.  At this point baby was  handed to pediatric team in attendance.  Cord bloods were obtained for  appropriate studies.  The placenta was then delivered.  The uterus was then  evacuated of any remaining products of conception.  The angles of the  uterine incision were then ligated using figure-of-eight sutures of #1  Vicryl.  Then the uterus was closed in layers.  The first layer was a  running interlocking suture of #1 Vicryl followed by an imbricating suture  of #1  Vicryl.  The bladder flap was reapproximated using running continuous  2-0 Vicryl suture.  At this point lap counts were taken and found to be  correct and the abdomen was closed. The parietal peritoneum was closed using  running continuous 0 Vicryl suture.  The rectus muscles were reapproximated  using running continuous 0 Vicryl suture.  The fascia was closed using two  sutures of 0 Vicryl each done at the lateral fascial angles and meeting in  the midline.  The subcutaneous tissue was closed using interrupted 0 Vicryl  sutures and then the  skin incision was closed using staples.  The estimated  blood loss was approximately 600 mL.  The patient tolerated the procedure  well and went to the recovery room in satisfactory condition.      Miguel Aschoff, M.D.  Electronically Signed     AR/MEDQ  D:  08/11/2005  T:  08/12/2005  Job:  [id number]

## 2010-11-19 ENCOUNTER — Other Ambulatory Visit: Payer: Self-pay | Admitting: Family Medicine

## 2010-11-19 MED ORDER — HYDROXYCHLOROQUINE SULFATE 200 MG PO TABS: 200.0000 mg | ORAL_TABLET | Freq: Every day | ORAL | Status: DC

## 2010-11-19 NOTE — Telephone Encounter (Signed)
done

## 2010-11-27 DIAGNOSIS — Z0279 Encounter for issue of other medical certificate: Secondary | ICD-10-CM

## 2011-01-01 LAB — COMPREHENSIVE METABOLIC PANEL WITH GFR
ALT: 74 — ABNORMAL HIGH
ALT: 74 — ABNORMAL HIGH
AST: 57 — ABNORMAL HIGH
AST: 55 — ABNORMAL HIGH
Albumin: 3.9
Albumin: 4.2
Alkaline Phosphatase: 100
Alkaline Phosphatase: 95
BUN: 5 — ABNORMAL LOW
BUN: 5 — ABNORMAL LOW
CO2: 25
CO2: 26
Calcium: 9.4
Calcium: 9.8
Chloride: 103
Chloride: 103
Creatinine, Ser: 0.84
Creatinine, Ser: 0.76
GFR calc non Af Amer: >60
GFR calc non Af Amer: >60
Glucose, Bld: 117 — ABNORMAL HIGH
Glucose, Bld: 108 — ABNORMAL HIGH
Potassium: 3.8
Potassium: 3.5
Sodium: 137
Sodium: 139
Total Bilirubin: 0.4
Total Bilirubin: 0.4
Total Protein: 7.3
Total Protein: 7.5

## 2011-01-01 LAB — CBC
HCT: 35.6 — ABNORMAL LOW
HCT: 36.2
HCT: 37.9
Hemoglobin: 12.3
Hemoglobin: 12.6
Hemoglobin: 12.7
MCHC: 34.4
MCHC: 34.7
MCHC: 33.6
MCV: 85.9
MCV: 86
MCV: 85.6
Platelets: 278
Platelets: 329
Platelets: 317
RBC: 4.14
RBC: 4.21
RBC: 4.42
RDW: 13.2
RDW: 13.6
RDW: 13
WBC: 10.3
WBC: 9.4
WBC: 11.3 — ABNORMAL HIGH

## 2011-01-01 LAB — LIPASE, BLOOD: Lipase: 28

## 2011-01-01 LAB — URINALYSIS, ROUTINE W REFLEX MICROSCOPIC
Bilirubin Urine: NEGATIVE
Bilirubin Urine: NEGATIVE
Glucose, UA: NEGATIVE
Glucose, UA: NEGATIVE
Hgb urine dipstick: NEGATIVE
Ketones, ur: NEGATIVE
Ketones, ur: NEGATIVE
Leukocytes, UA: NEGATIVE
Nitrite: NEGATIVE
Nitrite: NEGATIVE
Protein, ur: NEGATIVE
Protein, ur: NEGATIVE
Specific Gravity, Urine: 1.02
Specific Gravity, Urine: 1.008
Urobilinogen, UA: 0.2
Urobilinogen, UA: 0.2
pH: 5
pH: 6

## 2011-01-01 LAB — DIFFERENTIAL
Basophils Absolute: 0.1
Basophils Relative: 1
Eosinophils Absolute: 0.1
Eosinophils Relative: 1
Lymphocytes Relative: 26
Lymphs Abs: 2.9
Monocytes Absolute: 0.6
Monocytes Relative: 5
Neutro Abs: 7.7
Neutrophils Relative %: 68

## 2011-01-01 LAB — URINE MICROSCOPIC-ADD ON: WBC, UA: NONE SEEN

## 2011-01-01 LAB — HCG, QUANTITATIVE, PREGNANCY

## 2011-01-01 LAB — WET PREP, GENITAL
Clue Cells Wet Prep HPF POC: NONE SEEN
Trich, Wet Prep: NONE SEEN
Yeast Wet Prep HPF POC: NONE SEEN

## 2011-01-01 LAB — POCT PREGNANCY, URINE
Operator id: 151321
Preg Test, Ur: NEGATIVE

## 2011-01-01 LAB — GC/CHLAMYDIA PROBE AMP, GENITAL
Chlamydia, DNA Probe: NEGATIVE
GC Probe Amp, Genital: NEGATIVE

## 2011-01-02 LAB — CBC
HCT: 37.6
Hemoglobin: 12.6
MCHC: 33.6
MCV: 85.1
Platelets: 302
RBC: 4.41
RDW: 13.1
WBC: 10.8 — ABNORMAL HIGH

## 2011-01-02 LAB — I-STAT 8, (EC8 V) (CONVERTED LAB)
Acid-Base Excess: 1
BUN: 10
Bicarbonate: 24.6 — ABNORMAL HIGH
Chloride: 104
Glucose, Bld: 110 — ABNORMAL HIGH
HCT: 41
Hemoglobin: 13.9
Operator id: 277751
Potassium: 4
Sodium: 137
TCO2: 26
pCO2, Ven: 34.6 — ABNORMAL LOW
pH, Ven: 7.459 — ABNORMAL HIGH

## 2011-01-02 LAB — POCT I-STAT CREATININE
Creatinine, Ser: 0.8
Operator id: 277751

## 2011-01-02 LAB — POCT CARDIAC MARKERS
CKMB, poc: <1 — ABNORMAL LOW
Myoglobin, poc: 29.5
Operator id: 277751
Troponin i, poc: <0.05

## 2011-01-02 LAB — D-DIMER, QUANTITATIVE

## 2011-01-07 LAB — URINALYSIS, ROUTINE W REFLEX MICROSCOPIC
Bilirubin Urine: NEGATIVE
Bilirubin Urine: NEGATIVE
Glucose, UA: NEGATIVE
Glucose, UA: NEGATIVE
Hgb urine dipstick: NEGATIVE
Ketones, ur: 15 — AB
Ketones, ur: NEGATIVE
Leukocytes, UA: NEGATIVE
Nitrite: NEGATIVE
Nitrite: NEGATIVE
Protein, ur: >300 — AB
Protein, ur: NEGATIVE
Specific Gravity, Urine: 1.02
Specific Gravity, Urine: 1.024
Urobilinogen, UA: 0.2
Urobilinogen, UA: 1
pH: 5
pH: 6.5

## 2011-01-07 LAB — COMPREHENSIVE METABOLIC PANEL WITH GFR
ALT: 50 — ABNORMAL HIGH
ALT: 75 — ABNORMAL HIGH
ALT: 40 — ABNORMAL HIGH
AST: 40 — ABNORMAL HIGH
AST: 71 — ABNORMAL HIGH
AST: 40 — ABNORMAL HIGH
Albumin: 4.1
Albumin: 4.1
Albumin: 4
Alkaline Phosphatase: 90
Alkaline Phosphatase: 103
Alkaline Phosphatase: 95
BUN: 7
BUN: 5 — ABNORMAL LOW
BUN: 4 — ABNORMAL LOW
CO2: 26
CO2: 28
CO2: 28
Calcium: 9
Calcium: 9.9
Calcium: 9.7
Chloride: 100
Chloride: 102
Chloride: 102
Creatinine, Ser: 0.78
Creatinine, Ser: 0.83
Creatinine, Ser: 0.72
GFR calc non Af Amer: >60
GFR calc non Af Amer: >60
GFR calc non Af Amer: >60
Glucose, Bld: 100 — ABNORMAL HIGH
Glucose, Bld: 101 — ABNORMAL HIGH
Glucose, Bld: 98
Potassium: 3.6
Potassium: 3.9
Potassium: 3.7
Sodium: 136
Sodium: 139
Sodium: 139
Total Bilirubin: 0.6
Total Bilirubin: 0.6
Total Bilirubin: 0.7
Total Protein: 6.8
Total Protein: 7.7
Total Protein: 7.1

## 2011-01-07 LAB — CBC
HCT: 37.3
HCT: 39.4
HCT: 36.3
Hemoglobin: 12.6
Hemoglobin: 13.3
Hemoglobin: 12.2
MCHC: 33.7
MCHC: 33.7
MCHC: 33.5
MCV: 84.8
MCV: 85.1
MCV: 85.4
Platelets: 309
Platelets: 316
Platelets: 273
RBC: 4.4
RBC: 4.63
RBC: 4.25
RDW: 13.1
RDW: 13.2
RDW: 13.2
WBC: 9.8
WBC: 13 — ABNORMAL HIGH
WBC: 12.2 — ABNORMAL HIGH

## 2011-01-07 LAB — DIFFERENTIAL
Basophils Absolute: 0.1
Basophils Absolute: 0
Basophils Relative: 1
Basophils Relative: 0
Eosinophils Absolute: 0.2
Eosinophils Absolute: 0.1
Eosinophils Relative: 2
Eosinophils Relative: 1
Lymphocytes Relative: 29
Lymphocytes Relative: 24
Lymphs Abs: 2.8
Lymphs Abs: 2.9
Monocytes Absolute: 0.5
Monocytes Absolute: 0.6
Monocytes Relative: 5
Monocytes Relative: 5
Neutro Abs: 6.3
Neutro Abs: 8.5 — ABNORMAL HIGH
Neutrophils Relative %: 64
Neutrophils Relative %: 70

## 2011-01-07 LAB — LIPASE, BLOOD: Lipase: 28

## 2011-01-07 LAB — URINE MICROSCOPIC-ADD ON

## 2011-01-07 LAB — PREGNANCY, URINE: Preg Test, Ur: NEGATIVE

## 2011-01-14 LAB — URINALYSIS, ROUTINE W REFLEX MICROSCOPIC
Bilirubin Urine: NEGATIVE
Glucose, UA: NEGATIVE
Hgb urine dipstick: NEGATIVE
Ketones, ur: NEGATIVE
Nitrite: NEGATIVE
Protein, ur: NEGATIVE
Specific Gravity, Urine: 1.017
Urobilinogen, UA: 0.2
pH: 6

## 2011-01-14 LAB — DIFFERENTIAL
Basophils Absolute: 0.1
Basophils Relative: 1
Eosinophils Absolute: 0.2
Eosinophils Relative: 2
Lymphocytes Relative: 28
Lymphs Abs: 2.8
Monocytes Absolute: 0.5
Monocytes Relative: 5
Neutro Abs: 6.4
Neutrophils Relative %: 64

## 2011-01-14 LAB — BASIC METABOLIC PANEL WITH GFR
BUN: 6
CO2: 27
Calcium: 9.3
Chloride: 104
Creatinine, Ser: 0.8
GFR calc non Af Amer: >60
Glucose, Bld: 98
Potassium: 3.9
Sodium: 142

## 2011-01-14 LAB — CBC
HCT: 35.7 — ABNORMAL LOW
Hemoglobin: 12
MCHC: 33.6
MCV: 82.6
Platelets: 280
RBC: 4.32
RDW: 12.9
WBC: 10

## 2011-01-14 LAB — PREGNANCY, URINE: Preg Test, Ur: NEGATIVE

## 2011-04-09 ENCOUNTER — Other Ambulatory Visit: Payer: Self-pay | Admitting: Family Medicine

## 2011-04-09 MED ORDER — HYDROXYCHLOROQUINE SULFATE 200 MG PO TABS: 200.0000 mg | ORAL_TABLET | Freq: Every day | ORAL | Status: DC

## 2011-04-09 NOTE — Telephone Encounter (Signed)
Ok for #30, no refills.  Pt has not been seen recently and needs CBC and LFTs while taking this med.  Also needs regular eye exams.  Needs to schedule w/ Dr Laury Axon.

## 2011-04-09 NOTE — Telephone Encounter (Signed)
Last seen 07/07/10 and filled 11/19/10 # 30 with 3 refills    KP

## 2011-04-09 NOTE — Telephone Encounter (Signed)
Letter mailed to schedule and apt    KP 

## 2011-04-30 ENCOUNTER — Other Ambulatory Visit: Payer: Self-pay

## 2011-04-30 MED ORDER — HYDROXYCHLOROQUINE SULFATE 200 MG PO TABS: 200.0000 mg | ORAL_TABLET | Freq: Every day | ORAL | Status: DC

## 2011-04-30 NOTE — Telephone Encounter (Signed)
Last seen 07/07/10  she has an apt scheduled for 06/04/11. Please advise     KP

## 2011-06-02 ENCOUNTER — Telehealth: Payer: Self-pay | Admitting: Family Medicine

## 2011-06-02 MED ORDER — HYDROXYCHLOROQUINE SULFATE 200 MG PO TABS: 200.0000 mg | ORAL_TABLET | Freq: Every day | ORAL | Status: DC

## 2011-06-02 NOTE — Telephone Encounter (Signed)
Patient called in stating that she needed to reschedule her ov with fasting labs for Thursday. 1st available that I could get her in is on 07/09/11. She wanted a morning appt because of fasting and she said that she lives an 1 1/2 hours away. She rescheduled because she wants to go on her daughters field trip. She was very upset that I could not get her in quicker. She would like a refill of meds sent to [facility] on [address] to last her until her appt.

## 2011-06-02 NOTE — Telephone Encounter (Signed)
Last filled 04/30/11 #30 with 1 refill...please advise     KP

## 2011-06-02 NOTE — Telephone Encounter (Signed)
Lelon Perla, DO More Detail >>      Lelon Perla, DO        Sent: Tue June 02, 2011 10:52 AM    To: Almeta Monas, CMA        ZYNIAH FERRAIOLO    MRN: [id number] DOB: 1975-05-09     Pt Home: [phone]               Message     Ok to fill meds until ov-----  unfortuately I can not make appointment earlier if nothing available ---she can call back to see if cancellation       Rx faxed      KP

## 2011-06-04 ENCOUNTER — Ambulatory Visit: Payer: Managed Care, Other (non HMO) | Admitting: Family Medicine

## 2011-07-08 ENCOUNTER — Encounter: Payer: Self-pay | Admitting: Family Medicine

## 2011-07-08 ENCOUNTER — Telehealth: Payer: Self-pay | Admitting: Family Medicine

## 2011-07-08 MED ORDER — HYDROXYCHLOROQUINE SULFATE 200 MG PO TABS: 200.0000 mg | ORAL_TABLET | Freq: Every day | ORAL | Status: DC

## 2011-07-08 NOTE — Telephone Encounter (Signed)
We will fill meds for 30 days only

## 2011-07-08 NOTE — Telephone Encounter (Signed)
I spoke with patient and explained that she has cancelled multiple appointments, and I am aware that she can not control what happen with her grandmother and Dr. Laury Axon would as well, but I made patient aware that she has not been seen in over a year and Dr.Lowne requires patient to be seen in the office to manage medications, it is part of patient safety and making sure this medication is working or her. She voiced understanding and stated she just needed the medication refilled until her apt.   Apt scheduled for 09/02/11. Please advise    KP

## 2011-07-08 NOTE — Telephone Encounter (Signed)
Patient made aware that her Rx has been sent to the pharmacy      KP

## 2011-07-08 NOTE — Telephone Encounter (Signed)
Patient called today to cancel her OV with fasting labs for 07-09-11 that was needed for medication refills. I rescheduled her for the first available CPE-30 slot on 09-02-11. I spoke with [name] who advised me to let patient know that Dr. Laury Axon will make the decision on whether or not to refill her medications until that appt, which I did. Please note, the patient has canceled multiple times. The patient then became angry about the possibility of not getting her meds refilled and stated, "I will come and kill everyone in that g*d d*mn office." Patient hung up the phone, then called back and asked to be transferred to [employer], so I transferred her to voicemail.

## 2011-07-09 ENCOUNTER — Ambulatory Visit: Payer: Managed Care, Other (non HMO) | Admitting: Family Medicine

## 2011-07-16 ENCOUNTER — Telehealth: Payer: Self-pay | Admitting: Family Medicine

## 2011-07-16 NOTE — Telephone Encounter (Signed)
Certified letter concerning dismissal from the practice mailed 07/13/2011.rmf

## 2011-08-03 ENCOUNTER — Telehealth: Payer: Self-pay | Admitting: Family Medicine

## 2011-08-03 NOTE — Telephone Encounter (Signed)
Patient called 8:15am, 4.15.13 & management would not speak with her. I also spoke to other providers as per Managements request to see if they would call her & no one would. I called patient back and advised her she would have to wait til either you or Dr.Lowne returned. Also note this patient has a future appointment on file 5.22.13, 10:30am, please review and advise if this is acceptable per now & call patient back at [phone]

## 2011-08-03 NOTE — Telephone Encounter (Signed)
Pt would like [name] to call her back regarding the letter we sent her notifying her that she has been dismissed and to talk about the appointment she has scheduled in May. Please call back @ [phone]

## 2011-08-03 NOTE — Telephone Encounter (Signed)
I spoke with patient and she stated she received the letter, she stated she apologized after the call to [place]. She stated the cancellations were all due to health issus or personal issues and she had no control over these situations, she stated at the time she had the conversation with [place] she was under the influence of Percocet and Robaxin, she stated she has been with dr.Lowne for a really long time and would like for Dr.Lowne to continue to be her Health care provider. I made patient aware, this was out of my hands, but I will make Dr.Lowne aware of this conversation, patient is scheduled on 09/02/11 which in within her 33 day grace period and she will keep this apt, she again apologized and stated she had a lot going on. I made patient aware Dr.Lowe will get this message but I will not get back to er until next week and she understood.     KP

## 2011-08-10 NOTE — Telephone Encounter (Signed)
Dismissal was because of the way she treated the staff and things she said about me and staff.   Staff involved would have to be ok with letting her come back.  This is not the first time there has been a problem-.

## 2011-08-11 NOTE — Telephone Encounter (Signed)
Donna Haas please advise, this patient has a pending apt within her 33 days.    KP

## 2011-08-12 NOTE — Telephone Encounter (Signed)
The dismissal stands due to treatment of staff and Dr. Laury Axon.

## 2011-08-17 ENCOUNTER — Other Ambulatory Visit: Payer: Self-pay | Admitting: Family Medicine

## 2011-08-17 MED ORDER — HYDROXYCHLOROQUINE SULFATE 200 MG PO TABS: 200.0000 mg | ORAL_TABLET | Freq: Every day | ORAL | Status: DC

## 2011-08-17 NOTE — Telephone Encounter (Signed)
Faxed.   KP 

## 2011-08-17 NOTE — Telephone Encounter (Signed)
Please advise      KP 

## 2011-08-17 NOTE — Telephone Encounter (Signed)
Refill x1 

## 2011-08-17 NOTE — Telephone Encounter (Signed)
refill hydroxychloroquine 200mg  Tab Qty 30 Take one table by mouth once a day  Last filled 3.27.13 Last OV 3.26.12 Patient dismissed but still within 33 day period

## 2011-09-02 ENCOUNTER — Ambulatory Visit (INDEPENDENT_AMBULATORY_CARE_PROVIDER_SITE_OTHER): Payer: Medicaid Other | Admitting: Family Medicine

## 2011-09-02 ENCOUNTER — Encounter: Payer: Self-pay | Admitting: Family Medicine

## 2011-09-02 VITALS — BP 122/78 | HR 95 | Ht 60.0 in | Wt 157.8 lb

## 2011-09-02 DIAGNOSIS — M329 Systemic lupus erythematosus, unspecified: Secondary | ICD-10-CM

## 2011-09-02 DIAGNOSIS — Z0000 Encounter for general adult medical examination without abnormal findings: Secondary | ICD-10-CM

## 2011-09-02 DIAGNOSIS — Z79899 Other long term (current) drug therapy: Secondary | ICD-10-CM

## 2011-09-02 LAB — HEPATIC FUNCTION PANEL
ALT: 19 U/L (ref 0–35)
AST: 24 U/L (ref 0–37)
Albumin: 4.9 g/dL (ref 3.5–5.2)
Alkaline Phosphatase: 51 U/L (ref 39–117)
Bilirubin, Direct: 0.1 mg/dL (ref 0.0–0.3)
Total Bilirubin: 0.5 mg/dL (ref 0.3–1.2)
Total Protein: 8 g/dL (ref 6.0–8.3)

## 2011-09-02 LAB — CBC WITH DIFFERENTIAL/PLATELET
Basophils Absolute: 0 10*3/uL (ref 0.0–0.1)
Basophils Relative: 0.3 % (ref 0.0–3.0)
Eosinophils Absolute: 0.2 10*3/uL (ref 0.0–0.7)
Eosinophils Relative: 1.4 % (ref 0.0–5.0)
HCT: 42.5 % (ref 36.0–46.0)
Hemoglobin: 14.1 g/dL (ref 12.0–15.0)
Lymphocytes Relative: 17.2 % (ref 12.0–46.0)
Lymphs Abs: 1.9 10*3/uL (ref 0.7–4.0)
MCHC: 33.3 g/dL (ref 30.0–36.0)
MCV: 94.2 fl (ref 78.0–100.0)
Monocytes Absolute: 0.5 10*3/uL (ref 0.1–1.0)
Monocytes Relative: 4.9 % (ref 3.0–12.0)
Neutro Abs: 8.5 10*3/uL — ABNORMAL HIGH (ref 1.4–7.7)
Neutrophils Relative %: 76.2 % (ref 43.0–77.0)
Platelets: 232 10*3/uL (ref 150.0–400.0)
RBC: 4.51 Mil/uL (ref 3.87–5.11)
RDW: 12.2 % (ref 11.5–14.6)
WBC: 11.1 10*3/uL — ABNORMAL HIGH (ref 4.5–10.5)

## 2011-09-02 LAB — BASIC METABOLIC PANEL WITH GFR
BUN: 15 mg/dL (ref 6–23)
CO2: 27 meq/L (ref 19–32)
Calcium: 10.1 mg/dL (ref 8.4–10.5)
Chloride: 102 meq/L (ref 96–112)
Creatinine, Ser: 0.9 mg/dL (ref 0.4–1.2)
GFR: 80.38 mL/min
Glucose, Bld: 88 mg/dL (ref 70–99)
Potassium: 5.2 meq/L — ABNORMAL HIGH (ref 3.5–5.1)
Sodium: 141 meq/L (ref 135–145)

## 2011-09-02 LAB — TSH: TSH: 1.28 u[IU]/mL (ref 0.35–5.50)

## 2011-09-02 MED ORDER — HYDROXYCHLOROQUINE SULFATE 200 MG PO TABS: 200.0000 mg | ORAL_TABLET | Freq: Every day | ORAL | Status: DC

## 2011-09-02 NOTE — Patient Instructions (Signed)
Preventive Care for Adults, Female A healthy lifestyle and preventive care can promote health and wellness. Preventive health guidelines for women include the following key practices.  A routine yearly physical is a good way to check with your caregiver about your health and preventive screening. It is a chance to share any concerns and updates on your health, and to receive a thorough exam.   Visit your dentist for a routine exam and preventive care every 6 months. Brush your teeth twice a day and floss once a day. Good oral hygiene prevents tooth decay and gum disease.   The frequency of eye exams is based on your age, health, family medical history, use of contact lenses, and other factors. Follow your caregiver's recommendations for frequency of eye exams.   Eat a healthy diet. Foods like vegetables, fruits, whole grains, low-fat dairy products, and lean protein foods contain the nutrients you need without too many calories. Decrease your intake of foods high in solid fats, added sugars, and salt. Eat the right amount of calories for you.Get information about a proper diet from your caregiver, if necessary.   Regular physical exercise is one of the most important things you can do for your health. Most adults should get at least 150 minutes of moderate-intensity exercise (any activity that increases your heart rate and causes you to sweat) each week. In addition, most adults need muscle-strengthening exercises on 2 or more days a week.   Maintain a healthy weight. The body mass index (BMI) is a screening tool to identify possible weight problems. It provides an estimate of body fat based on height and weight. Your caregiver can help determine your BMI, and can help you achieve or maintain a healthy weight.For adults 20 years and older:   A BMI below 18.5 is considered underweight.   A BMI of 18.5 to 24.9 is normal.   A BMI of 25 to 29.9 is considered overweight.   A BMI of 30 and above is  considered obese.   Maintain normal blood lipids and cholesterol levels by exercising and minimizing your intake of saturated fat. Eat a balanced diet with plenty of fruit and vegetables. Blood tests for lipids and cholesterol should begin at age 20 and be repeated every 5 years. If your lipid or cholesterol levels are high, you are over 50, or you are at high risk for heart disease, you may need your cholesterol levels checked more frequently.Ongoing high lipid and cholesterol levels should be treated with medicines if diet and exercise are not effective.   If you smoke, find out from your caregiver how to quit. If you do not use tobacco, do not start.   If you are pregnant, do not drink alcohol. If you are breastfeeding, be very cautious about drinking alcohol. If you are not pregnant and choose to drink alcohol, do not exceed 1 drink per day. One drink is considered to be 12 ounces (355 mL) of beer, 5 ounces (148 mL) of wine, or 1.5 ounces (44 mL) of liquor.   Avoid use of street drugs. Do not share needles with anyone. Ask for help if you need support or instructions about stopping the use of drugs.   High blood pressure causes heart disease and increases the risk of stroke. Your blood pressure should be checked at least every 1 to 2 years. Ongoing high blood pressure should be treated with medicines if weight loss and exercise are not effective.   If you are 55 to 34   years old, ask your caregiver if you should take aspirin to prevent strokes.   Diabetes screening involves taking a blood sample to check your fasting blood sugar level. This should be done once every 3 years, after age 45, if you are within normal weight and without risk factors for diabetes. Testing should be considered at a younger age or be carried out more frequently if you are overweight and have at least 1 risk factor for diabetes.   Breast cancer screening is essential preventive care for women. You should practice "breast  self-awareness." This means understanding the normal appearance and feel of your breasts and may include breast self-examination. Any changes detected, no matter how small, should be reported to a caregiver. Women in their 20s and 30s should have a clinical breast exam (CBE) by a caregiver as part of a regular health exam every 1 to 3 years. After age 40, women should have a CBE every year. Starting at age 40, women should consider having a mammography (breast X-ray test) every year. Women who have a family history of breast cancer should talk to their caregiver about genetic screening. Women at a high risk of breast cancer should talk to their caregivers about having magnetic resonance imaging (MRI) and a mammography every year.   The Pap test is a screening test for cervical cancer. A Pap test can show cell changes on the cervix that might become cervical cancer if left untreated. A Pap test is a procedure in which cells are obtained and examined from the lower end of the uterus (cervix).   Women should have a Pap test starting at age 21.   Between ages 21 and 29, Pap tests should be repeated every 2 years.   Beginning at age 30, you should have a Pap test every 3 years as long as the past 3 Pap tests have been normal.   Some women have medical problems that increase the chance of getting cervical cancer. Talk to your caregiver about these problems. It is especially important to talk to your caregiver if a new problem develops soon after your last Pap test. In these cases, your caregiver may recommend more frequent screening and Pap tests.   The above recommendations are the same for women who have or have not gotten the vaccine for human papillomavirus (HPV).   If you had a hysterectomy for a problem that was not cancer or a condition that could lead to cancer, then you no longer need Pap tests. Even if you no longer need a Pap test, a regular exam is a good idea to make sure no other problems are  starting.   If you are between ages 65 and 70, and you have had normal Pap tests going back 10 years, you no longer need Pap tests. Even if you no longer need a Pap test, a regular exam is a good idea to make sure no other problems are starting.   If you have had past treatment for cervical cancer or a condition that could lead to cancer, you need Pap tests and screening for cancer for at least 20 years after your treatment.   If Pap tests have been discontinued, risk factors (such as a new sexual partner) need to be reassessed to determine if screening should be resumed.   The HPV test is an additional test that may be used for cervical cancer screening. The HPV test looks for the virus that can cause the cell changes on the cervix.   The cells collected during the Pap test can be tested for HPV. The HPV test could be used to screen women aged 30 years and older, and should be used in women of any age who have unclear Pap test results. After the age of 30, women should have HPV testing at the same frequency as a Pap test.   Colorectal cancer can be detected and often prevented. Most routine colorectal cancer screening begins at the age of 50 and continues through age 75. However, your caregiver may recommend screening at an earlier age if you have risk factors for colon cancer. On a yearly basis, your caregiver may provide home test kits to check for hidden blood in the stool. Use of a small camera at the end of a tube, to directly examine the colon (sigmoidoscopy or colonoscopy), can detect the earliest forms of colorectal cancer. Talk to your caregiver about this at age 50, when routine screening begins. Direct examination of the colon should be repeated every 5 to 10 years through age 75, unless early forms of pre-cancerous polyps or small growths are found.   Hepatitis C blood testing is recommended for all people born from 1945 through 1965 and any individual with known risks for hepatitis C.    Practice safe sex. Use condoms and avoid high-risk sexual practices to reduce the spread of sexually transmitted infections (STIs). STIs include gonorrhea, chlamydia, syphilis, trichomonas, herpes, HPV, and human immunodeficiency virus (HIV). Herpes, HIV, and HPV are viral illnesses that have no cure. They can result in disability, cancer, and death. Sexually active women aged 25 and younger should be checked for chlamydia. Older women with new or multiple partners should also be tested for chlamydia. Testing for other STIs is recommended if you are sexually active and at increased risk.   Osteoporosis is a disease in which the bones lose minerals and strength with aging. This can result in serious bone fractures. The risk of osteoporosis can be identified using a bone density scan. Women ages 65 and over and women at risk for fractures or osteoporosis should discuss screening with their caregivers. Ask your caregiver whether you should take a calcium supplement or vitamin D to reduce the rate of osteoporosis.   Menopause can be associated with physical symptoms and risks. Hormone replacement therapy is available to decrease symptoms and risks. You should talk to your caregiver about whether hormone replacement therapy is right for you.   Use sunscreen with sun protection factor (SPF) of 30 or more. Apply sunscreen liberally and repeatedly throughout the day. You should seek shade when your shadow is shorter than you. Protect yourself by wearing long sleeves, pants, a wide-brimmed hat, and sunglasses year round, whenever you are outdoors.   Once a month, do a whole body skin exam, using a mirror to look at the skin on your back. Notify your caregiver of new moles, moles that have irregular borders, moles that are larger than a pencil eraser, or moles that have changed in shape or color.   Stay current with required immunizations.   Influenza. You need a dose every fall (or winter). The composition of  the flu vaccine changes each year, so being vaccinated once is not enough.   Pneumococcal polysaccharide. You need 1 to 2 doses if you smoke cigarettes or if you have certain chronic medical conditions. You need 1 dose at age 65 (or older) if you have never been vaccinated.   Tetanus, diphtheria, pertussis (Tdap, Td). Get 1 dose of   Tdap vaccine if you are younger than age 65, are over 65 and have contact with an infant, are a healthcare worker, are pregnant, or simply want to be protected from whooping cough. After that, you need a Td booster dose every 10 years. Consult your caregiver if you have not had at least 3 tetanus and diphtheria-containing shots sometime in your life or have a deep or dirty wound.   HPV. You need this vaccine if you are a woman age 26 or younger. The vaccine is given in 3 doses over 6 months.   Measles, mumps, rubella (MMR). You need at least 1 dose of MMR if you were born in 1957 or later. You may also need a second dose.   Meningococcal. If you are age 19 to 21 and a first-year college student living in a residence hall, or have one of several medical conditions, you need to get vaccinated against meningococcal disease. You may also need additional booster doses.   Zoster (shingles). If you are age 60 or older, you should get this vaccine.   Varicella (chickenpox). If you have never had chickenpox or you were vaccinated but received only 1 dose, talk to your caregiver to find out if you need this vaccine.   Hepatitis A. You need this vaccine if you have a specific risk factor for hepatitis A virus infection or you simply wish to be protected from this disease. The vaccine is usually given as 2 doses, 6 to 18 months apart.   Hepatitis B. You need this vaccine if you have a specific risk factor for hepatitis B virus infection or you simply wish to be protected from this disease. The vaccine is given in 3 doses, usually over 6 months.  Preventive Services /  Frequency Ages 19 to 39  Blood pressure check.** / Every 1 to 2 years.   Lipid and cholesterol check.** / Every 5 years beginning at age 20.   Clinical breast exam.** / Every 3 years for women in their 20s and 30s.   Pap test.** / Every 2 years from ages 21 through 29. Every 3 years starting at age 30 through age 65 or 70 with a history of 3 consecutive normal Pap tests.   HPV screening.** / Every 3 years from ages 30 through ages 65 to 70 with a history of 3 consecutive normal Pap tests.   Hepatitis C blood test.** / For any individual with known risks for hepatitis C.   Skin self-exam. / Monthly.   Influenza immunization.** / Every year.   Pneumococcal polysaccharide immunization.** / 1 to 2 doses if you smoke cigarettes or if you have certain chronic medical conditions.   Tetanus, diphtheria, pertussis (Tdap, Td) immunization. / A one-time dose of Tdap vaccine. After that, you need a Td booster dose every 10 years.   HPV immunization. / 3 doses over 6 months, if you are 26 and younger.   Measles, mumps, rubella (MMR) immunization. / You need at least 1 dose of MMR if you were born in 1957 or later. You may also need a second dose.   Meningococcal immunization. / 1 dose if you are age 19 to 21 and a first-year college student living in a residence hall, or have one of several medical conditions, you need to get vaccinated against meningococcal disease. You may also need additional booster doses.   Varicella immunization.** / Consult your caregiver.   Hepatitis A immunization.** / Consult your caregiver. 2 doses, 6 to 18 months   apart.   Hepatitis B immunization.** / Consult your caregiver. 3 doses usually over 6 months.  Ages 40 to 64  Blood pressure check.** / Every 1 to 2 years.   Lipid and cholesterol check.** / Every 5 years beginning at age 20.   Clinical breast exam.** / Every year after age 40.   Mammogram.** / Every year beginning at age 40 and continuing for as  long as you are in good health. Consult with your caregiver.   Pap test.** / Every 3 years starting at age 30 through age 65 or 70 with a history of 3 consecutive normal Pap tests.   HPV screening.** / Every 3 years from ages 30 through ages 65 to 70 with a history of 3 consecutive normal Pap tests.   Fecal occult blood test (FOBT) of stool. / Every year beginning at age 50 and continuing until age 75. You may not need to do this test if you get a colonoscopy every 10 years.   Flexible sigmoidoscopy or colonoscopy.** / Every 5 years for a flexible sigmoidoscopy or every 10 years for a colonoscopy beginning at age 50 and continuing until age 75.   Hepatitis C blood test.** / For all people born from 1945 through 1965 and any individual with known risks for hepatitis C.   Skin self-exam. / Monthly.   Influenza immunization.** / Every year.   Pneumococcal polysaccharide immunization.** / 1 to 2 doses if you smoke cigarettes or if you have certain chronic medical conditions.   Tetanus, diphtheria, pertussis (Tdap, Td) immunization.** / A one-time dose of Tdap vaccine. After that, you need a Td booster dose every 10 years.   Measles, mumps, rubella (MMR) immunization. / You need at least 1 dose of MMR if you were born in 1957 or later. You may also need a second dose.   Varicella immunization.** / Consult your caregiver.   Meningococcal immunization.** / Consult your caregiver.   Hepatitis A immunization.** / Consult your caregiver. 2 doses, 6 to 18 months apart.   Hepatitis B immunization.** / Consult your caregiver. 3 doses, usually over 6 months.  Ages 65 and over  Blood pressure check.** / Every 1 to 2 years.   Lipid and cholesterol check.** / Every 5 years beginning at age 20.   Clinical breast exam.** / Every year after age 40.   Mammogram.** / Every year beginning at age 40 and continuing for as long as you are in good health. Consult with your caregiver.   Pap test.** /  Every 3 years starting at age 30 through age 65 or 70 with a 3 consecutive normal Pap tests. Testing can be stopped between 65 and 70 with 3 consecutive normal Pap tests and no abnormal Pap or HPV tests in the past 10 years.   HPV screening.** / Every 3 years from ages 30 through ages 65 or 70 with a history of 3 consecutive normal Pap tests. Testing can be stopped between 65 and 70 with 3 consecutive normal Pap tests and no abnormal Pap or HPV tests in the past 10 years.   Fecal occult blood test (FOBT) of stool. / Every year beginning at age 50 and continuing until age 75. You may not need to do this test if you get a colonoscopy every 10 years.   Flexible sigmoidoscopy or colonoscopy.** / Every 5 years for a flexible sigmoidoscopy or every 10 years for a colonoscopy beginning at age 50 and continuing until age 75.   Hepatitis   C blood test.** / For all people born from 1945 through 1965 and any individual with known risks for hepatitis C.   Osteoporosis screening.** / A one-time screening for women ages 65 and over and women at risk for fractures or osteoporosis.   Skin self-exam. / Monthly.   Influenza immunization.** / Every year.   Pneumococcal polysaccharide immunization.** / 1 dose at age 65 (or older) if you have never been vaccinated.   Tetanus, diphtheria, pertussis (Tdap, Td) immunization. / A one-time dose of Tdap vaccine if you are over 65 and have contact with an infant, are a healthcare worker, or simply want to be protected from whooping cough. After that, you need a Td booster dose every 10 years.   Varicella immunization.** / Consult your caregiver.   Meningococcal immunization.** / Consult your caregiver.   Hepatitis A immunization.** / Consult your caregiver. 2 doses, 6 to 18 months apart.   Hepatitis B immunization.** / Check with your caregiver. 3 doses, usually over 6 months.  ** Family history and personal history of risk and conditions may change your caregiver's  recommendations. Document Released: 05/26/2001 Document Revised: 03/19/2011 Document Reviewed: 08/25/2010 [employer] Patient Information 2012 [employer]. 

## 2011-09-02 NOTE — Progress Notes (Signed)
  Subjective:    Patient ID: Donna Haas, female    DOB: October 04, 1975, 36 y.o.   MRN: [id number]  HPI Pt really not here for cpe.  She wants to discuss dismissal letter.  Pt is very upset and does not want to leave practice.  We went over facts of the day she called.  She does not remember saying the thinks that were documented.  She said she does remember calling right back to apologize to [place].   Pt is asking to be reinstated.  I explained it was out of my hands.  I would discuss with Harriett Sine and Arlys John and one of them would call her back.  If the decision stands we will help her find another Dr and transfer records.    Pt understood.  Pt in room 30 min   Review of Systems     Objective:   Physical Exam        Assessment & Plan:

## 2011-09-03 ENCOUNTER — Encounter: Payer: Managed Care, Other (non HMO) | Admitting: Obstetrics & Gynecology

## 2011-09-09 ENCOUNTER — Telehealth: Payer: Self-pay | Admitting: Family Medicine

## 2011-09-09 NOTE — Telephone Encounter (Signed)
Pt called stating she is waiting to hear back from [place] regarding the status of her dismissal. Call back # [phone]

## 2011-09-09 NOTE — Telephone Encounter (Signed)
Call placed to patient at [phone], she was informed status of dismissal was still in place. She is requesting a return phone call for manager.

## 2011-09-17 ENCOUNTER — Encounter: Payer: Self-pay | Admitting: Obstetrics & Gynecology

## 2011-09-17 ENCOUNTER — Ambulatory Visit (INDEPENDENT_AMBULATORY_CARE_PROVIDER_SITE_OTHER): Payer: Medicare Other | Admitting: Obstetrics & Gynecology

## 2011-09-17 VITALS — BP 107/83 | HR 83 | Ht 60.0 in | Wt 158.5 lb

## 2011-09-17 DIAGNOSIS — Z3202 Encounter for pregnancy test, result negative: Secondary | ICD-10-CM

## 2011-09-17 DIAGNOSIS — N96 Recurrent pregnancy loss: Secondary | ICD-10-CM

## 2011-09-17 LAB — POCT URINE PREGNANCY: Preg Test, Ur: NEGATIVE

## 2011-09-17 MED ORDER — PROGESTERONE MICRONIZED 100 MG PO CAPS: ORAL_CAPSULE | ORAL | Status: DC

## 2011-09-17 NOTE — Progress Notes (Signed)
  Subjective:    Patient ID: Donna Haas, female    DOB: [birth year], 36 y.o.   MRN: [id number]  HPI Donna Haas is a 36 yo SW [id number] with lupus who is here today for "infertility". She had a miscarriage 2/13 (Dr. Cipriano Haas office). She uses [employer] and ovulates each month, although not exactly the same day each month. The pregnancy in 2/13 was fathered by her current partner. She is requesting clomid because 7 years ago she took 6 cycles of clomid and did concieve her daughter.   Review of Systems She tells me that her pap smear is UTD    Objective:   Physical Exam        Assessment & Plan:  Recurrent miscarriages. Probably due to her lupus. I have offered to refer her to a RE but she does not want to do this for financial reasons. I have explained why clomid is not appropriate for her at this time but I have agreed to prescribe progesterone suppliments so that when she conceives, she can use the prog. And hopefully not miscarry.

## 2012-03-03 ENCOUNTER — Other Ambulatory Visit: Payer: Self-pay | Admitting: Family Medicine

## 2012-03-03 NOTE — Telephone Encounter (Signed)
Patient has been dismissed from the practice, Rx denied---    KP

## 2012-03-30 ENCOUNTER — Other Ambulatory Visit: Payer: Self-pay | Admitting: Obstetrics & Gynecology

## 2012-03-30 LAB — HCG, QUANTITATIVE, PREGNANCY: Beta Hcg, Quant.: <1 m[IU]/mL — ABNORMAL LOW

## 2012-03-31 ENCOUNTER — Ambulatory Visit: Payer: Self-pay | Admitting: Obstetrics & Gynecology

## 2012-08-12 ENCOUNTER — Emergency Department (HOSPITAL_COMMUNITY): Payer: Medicare Other

## 2012-08-12 ENCOUNTER — Encounter (HOSPITAL_COMMUNITY): Payer: Self-pay | Admitting: *Deleted

## 2012-08-12 ENCOUNTER — Emergency Department (HOSPITAL_COMMUNITY)
Admission: EM | Admit: 2012-08-12 | Discharge: 2012-08-12 | Disposition: A | Discharge status: Home / Self Care | Payer: Medicare Other | Attending: Emergency Medicine | Admitting: Emergency Medicine

## 2012-08-12 DIAGNOSIS — F411 Generalized anxiety disorder: Secondary | ICD-10-CM | POA: Insufficient documentation

## 2012-08-12 DIAGNOSIS — Z8742 Personal history of other diseases of the female genital tract: Secondary | ICD-10-CM | POA: Insufficient documentation

## 2012-08-12 DIAGNOSIS — M542 Cervicalgia: Secondary | ICD-10-CM

## 2012-08-12 DIAGNOSIS — Z8639 Personal history of other endocrine, nutritional and metabolic disease: Secondary | ICD-10-CM | POA: Insufficient documentation

## 2012-08-12 DIAGNOSIS — X500XXA Overexertion from strenuous movement or load, initial encounter: Secondary | ICD-10-CM | POA: Insufficient documentation

## 2012-08-12 DIAGNOSIS — Z87442 Personal history of urinary calculi: Secondary | ICD-10-CM | POA: Insufficient documentation

## 2012-08-12 DIAGNOSIS — Z8719 Personal history of other diseases of the digestive system: Secondary | ICD-10-CM | POA: Insufficient documentation

## 2012-08-12 DIAGNOSIS — R202 Paresthesia of skin: Secondary | ICD-10-CM

## 2012-08-12 DIAGNOSIS — S199XXA Unspecified injury of neck, initial encounter: Secondary | ICD-10-CM | POA: Insufficient documentation

## 2012-08-12 DIAGNOSIS — R209 Unspecified disturbances of skin sensation: Secondary | ICD-10-CM | POA: Insufficient documentation

## 2012-08-12 DIAGNOSIS — Y9289 Other specified places as the place of occurrence of the external cause: Secondary | ICD-10-CM | POA: Insufficient documentation

## 2012-08-12 DIAGNOSIS — E669 Obesity, unspecified: Secondary | ICD-10-CM | POA: Insufficient documentation

## 2012-08-12 DIAGNOSIS — Y9389 Activity, other specified: Secondary | ICD-10-CM | POA: Insufficient documentation

## 2012-08-12 DIAGNOSIS — Z79899 Other long term (current) drug therapy: Secondary | ICD-10-CM | POA: Insufficient documentation

## 2012-08-12 DIAGNOSIS — Z862 Personal history of diseases of the blood and blood-forming organs and certain disorders involving the immune mechanism: Secondary | ICD-10-CM | POA: Insufficient documentation

## 2012-08-12 DIAGNOSIS — S0993XA Unspecified injury of face, initial encounter: Secondary | ICD-10-CM | POA: Insufficient documentation

## 2012-08-12 DIAGNOSIS — K219 Gastro-esophageal reflux disease without esophagitis: Secondary | ICD-10-CM | POA: Insufficient documentation

## 2012-08-12 MED ORDER — SODIUM CHLORIDE 0.9 % IV BOLUS (SEPSIS)
500.0000 mL | Freq: Once | INTRAVENOUS | Status: AC
  Administered 2012-08-12: 500 mL via INTRAVENOUS

## 2012-08-12 MED ORDER — OXYCODONE-ACETAMINOPHEN 5-325 MG PO TABS: 1.0000 | ORAL_TABLET | ORAL | Status: DC | PRN

## 2012-08-12 MED ORDER — ONDANSETRON 4 MG PO TBDP
4.0000 mg | ORAL_TABLET | Freq: Once | ORAL | Status: AC
  Administered 2012-08-12: 4 mg via ORAL
  Filled 2012-08-12: qty 1

## 2012-08-12 MED ORDER — OXYCODONE-ACETAMINOPHEN 5-325 MG PO TABS
1.0000 | ORAL_TABLET | Freq: Once | ORAL | Status: AC
  Administered 2012-08-12: 1 via ORAL
  Filled 2012-08-12: qty 1

## 2012-08-12 MED ORDER — MORPHINE SULFATE 4 MG/ML IJ SOLN
4.0000 mg | Freq: Once | INTRAMUSCULAR | Status: AC
  Administered 2012-08-12: 4 mg via INTRAVENOUS
  Filled 2012-08-12: qty 1

## 2012-08-12 NOTE — Progress Notes (Signed)
Orthopedic Tech Progress Note Patient Details:  Donna Haas Jul 02, 1975 [id number]  Ortho Devices Type of Ortho Device: Soft collar Ortho Device/Splint Location: neck Ortho Device/Splint Interventions: Application   Donna Haas 08/12/2012, 3:30 PM
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
## 2012-08-12 NOTE — ED Notes (Signed)
Patient c/o pain in neck, patient unable to turn head from side to side and arm pain

## 2012-08-12 NOTE — Discharge Instructions (Signed)
 Your CT scan showed some disc degeneration at C4/C5 and a very small protrusion that may account for your symptoms. I am discharging you with follow up at Neurosurgery. Please do not drive or operate heavy machinery while taking the pain medication.   Cervical Radiculopathy    Cervical radiculopathy happens when a nerve in the neck is pinched or bruised by a slipped (herniated) disk or by arthritic changes in the bones of the cervical spine. This can occur due to an injury or as part of the normal aging process. Pressure on the cervical nerves can cause pain or numbness that runs from your neck all the way down into your arm and fingers.  CAUSES  There are many possible causes, including:  Injury.  Muscle tightness in the neck from overuse.  Swollen, painful joints (arthritis).  Breakdown or degeneration in the bones and joints of the spine (spondylosis) due to aging.  Bone spurs that may develop near the cervical nerves. SYMPTOMS  Symptoms include pain, weakness, or numbness in the affected arm and hand. Pain can be severe or irritating. Symptoms may be worse when extending or turning the neck.  DIAGNOSIS  Your caregiver will ask about your symptoms and do a physical exam. He or she may test your strength and reflexes. X-rays, CT scans, and MRI scans may be needed in cases of injury or if the symptoms do not go away after a period of time. Electromyography (EMG) or nerve conduction testing may be done to study how your nerves and muscles are working.  TREATMENT  Your caregiver may recommend certain exercises to help relieve your symptoms. Cervical radiculopathy can, and often does, get better with time and treatment. If your problems continue, treatment options may include:  Wearing a soft collar for short periods of time.  Physical therapy to strengthen the neck muscles.  Medicines, such as nonsteroidal anti-inflammatory drugs (NSAIDs), oral corticosteroids, or spinal injections.  Surgery.  Different types of surgery may be done depending on the cause of your problems. HOME CARE INSTRUCTIONS  Put ice on the affected area.  Put ice in a plastic bag.  Place a towel between your skin and the bag.  Leave the ice on for 15 to 20 minutes, 3 to 4 times a day or as directed by your caregiver.  If ice does not help, you can try using heat. Take a warm shower or bath, or use a hot water bottle as directed by your caregiver.  You may try a gentle neck and shoulder massage.  Use a flat pillow when you sleep.  Only take over-the-counter or prescription medicines for pain, discomfort, or fever as directed by your caregiver.  If physical therapy was prescribed, follow your caregiver's directions.  If a soft collar was prescribed, use it as directed. SEEK IMMEDIATE MEDICAL CARE IF:  Your pain gets much worse and cannot be controlled with medicines.  You have weakness or numbness in your hand, arm, face, or leg.  You have a high fever or a stiff, rigid neck.  You lose bowel or bladder control (incontinence).  You have trouble with walking, balance, or speaking. MAKE SURE YOU:  Understand these instructions.  Will watch your condition.  Will get help right away if you are not doing well or get worse. Document Released: 12/23/2000 Document Revised: 06/22/2011 Document Reviewed: 11/11/2010  [facility] Patient Information 2013 [place], [place].

## 2012-08-17 NOTE — ED Provider Notes (Signed)
Medical screening examination/treatment/procedure(s) were performed by non-physician practitioner and as supervising physician I was immediately available for consultation/collaboration.  Griffyn Kucinski R. Jacody Beneke, MD 08/17/12 0804 

## 2012-08-29 ENCOUNTER — Other Ambulatory Visit: Payer: Self-pay | Admitting: Neurosurgery

## 2012-08-29 DIAGNOSIS — M5412 Radiculopathy, cervical region: Secondary | ICD-10-CM

## 2012-09-01 ENCOUNTER — Ambulatory Visit
Admission: RE | Admit: 2012-09-01 | Discharge: 2012-09-01 | Disposition: A | Discharge status: Home / Self Care | Payer: Medicare Other | Source: Ambulatory Visit | Attending: Neurosurgery | Admitting: Neurosurgery

## 2012-09-01 DIAGNOSIS — M5412 Radiculopathy, cervical region: Secondary | ICD-10-CM

## 2012-09-08 ENCOUNTER — Emergency Department (HOSPITAL_COMMUNITY): Payer: Medicare Other

## 2012-09-08 ENCOUNTER — Emergency Department (HOSPITAL_COMMUNITY)
Admission: EM | Admit: 2012-09-08 | Discharge: 2012-09-08 | Disposition: A | Discharge status: Home / Self Care | Payer: Medicare Other | Attending: Emergency Medicine | Admitting: Emergency Medicine

## 2012-09-08 ENCOUNTER — Encounter (HOSPITAL_COMMUNITY): Payer: Self-pay | Admitting: Emergency Medicine

## 2012-09-08 DIAGNOSIS — M79602 Pain in left arm: Secondary | ICD-10-CM | POA: Diagnosis present

## 2012-09-08 DIAGNOSIS — Z79899 Other long term (current) drug therapy: Secondary | ICD-10-CM | POA: Insufficient documentation

## 2012-09-08 DIAGNOSIS — G8929 Other chronic pain: Secondary | ICD-10-CM | POA: Insufficient documentation

## 2012-09-08 DIAGNOSIS — E669 Obesity, unspecified: Secondary | ICD-10-CM | POA: Insufficient documentation

## 2012-09-08 DIAGNOSIS — Z8742 Personal history of other diseases of the female genital tract: Secondary | ICD-10-CM | POA: Insufficient documentation

## 2012-09-08 DIAGNOSIS — Z8719 Personal history of other diseases of the digestive system: Secondary | ICD-10-CM | POA: Insufficient documentation

## 2012-09-08 DIAGNOSIS — M542 Cervicalgia: Secondary | ICD-10-CM | POA: Insufficient documentation

## 2012-09-08 DIAGNOSIS — H53149 Visual discomfort, unspecified: Secondary | ICD-10-CM | POA: Insufficient documentation

## 2012-09-08 DIAGNOSIS — L930 Discoid lupus erythematosus: Secondary | ICD-10-CM | POA: Diagnosis present

## 2012-09-08 DIAGNOSIS — R51 Headache: Secondary | ICD-10-CM | POA: Insufficient documentation

## 2012-09-08 DIAGNOSIS — M549 Dorsalgia, unspecified: Secondary | ICD-10-CM | POA: Insufficient documentation

## 2012-09-08 DIAGNOSIS — Z8739 Personal history of other diseases of the musculoskeletal system and connective tissue: Secondary | ICD-10-CM | POA: Insufficient documentation

## 2012-09-08 DIAGNOSIS — R209 Unspecified disturbances of skin sensation: Secondary | ICD-10-CM | POA: Insufficient documentation

## 2012-09-08 DIAGNOSIS — Z8639 Personal history of other endocrine, nutritional and metabolic disease: Secondary | ICD-10-CM | POA: Insufficient documentation

## 2012-09-08 DIAGNOSIS — M329 Systemic lupus erythematosus, unspecified: Secondary | ICD-10-CM | POA: Diagnosis present

## 2012-09-08 DIAGNOSIS — R519 Headache, unspecified: Secondary | ICD-10-CM | POA: Diagnosis present

## 2012-09-08 DIAGNOSIS — G4733 Obstructive sleep apnea (adult) (pediatric): Secondary | ICD-10-CM | POA: Diagnosis present

## 2012-09-08 DIAGNOSIS — M62838 Other muscle spasm: Secondary | ICD-10-CM | POA: Insufficient documentation

## 2012-09-08 DIAGNOSIS — F411 Generalized anxiety disorder: Secondary | ICD-10-CM | POA: Insufficient documentation

## 2012-09-08 DIAGNOSIS — Z862 Personal history of diseases of the blood and blood-forming organs and certain disorders involving the immune mechanism: Secondary | ICD-10-CM | POA: Insufficient documentation

## 2012-09-08 DIAGNOSIS — K219 Gastro-esophageal reflux disease without esophagitis: Secondary | ICD-10-CM | POA: Insufficient documentation

## 2012-09-08 DIAGNOSIS — R079 Chest pain, unspecified: Secondary | ICD-10-CM | POA: Insufficient documentation

## 2012-09-08 DIAGNOSIS — R110 Nausea: Secondary | ICD-10-CM | POA: Insufficient documentation

## 2012-09-08 DIAGNOSIS — Z87442 Personal history of urinary calculi: Secondary | ICD-10-CM | POA: Insufficient documentation

## 2012-09-08 LAB — COMPREHENSIVE METABOLIC PANEL WITH GFR
ALT: 24 U/L (ref 0–35)
AST: 22 U/L (ref 0–37)
Albumin: 4.1 g/dL (ref 3.5–5.2)
Alkaline Phosphatase: 64 U/L (ref 39–117)
BUN: 7 mg/dL (ref 6–23)
CO2: 21 meq/L (ref 19–32)
Calcium: 9.4 mg/dL (ref 8.4–10.5)
Chloride: 103 meq/L (ref 96–112)
Creatinine, Ser: 0.71 mg/dL (ref 0.50–1.10)
GFR calc Af Amer: >90 mL/min
GFR calc non Af Amer: >90 mL/min
Glucose, Bld: 113 mg/dL — ABNORMAL HIGH (ref 70–99)
Potassium: 3.5 meq/L (ref 3.5–5.1)
Sodium: 138 meq/L (ref 135–145)
Total Bilirubin: 0.2 mg/dL — ABNORMAL LOW (ref 0.3–1.2)
Total Protein: 7.1 g/dL (ref 6.0–8.3)

## 2012-09-08 LAB — CBC WITH DIFFERENTIAL/PLATELET
Basophils Absolute: 0 10*3/uL (ref 0.0–0.1)
Basophils Relative: 0 % (ref 0–1)
Eosinophils Absolute: 0.1 10*3/uL (ref 0.0–0.7)
Eosinophils Relative: 1 % (ref 0–5)
HCT: 34.4 % — ABNORMAL LOW (ref 36.0–46.0)
Hemoglobin: 11.9 g/dL — ABNORMAL LOW (ref 12.0–15.0)
Lymphocytes Relative: 19 % (ref 12–46)
Lymphs Abs: 1.6 10*3/uL (ref 0.7–4.0)
MCH: 30.3 pg (ref 26.0–34.0)
MCHC: 34.6 g/dL (ref 30.0–36.0)
MCV: 87.5 fL (ref 78.0–100.0)
Monocytes Absolute: 0.5 10*3/uL (ref 0.1–1.0)
Monocytes Relative: 6 % (ref 3–12)
Neutro Abs: 6.3 10*3/uL (ref 1.7–7.7)
Neutrophils Relative %: 74 % (ref 43–77)
Platelets: 244 10*3/uL (ref 150–400)
RBC: 3.93 MIL/uL (ref 3.87–5.11)
RDW: 12 % (ref 11.5–15.5)
WBC: 8.5 10*3/uL (ref 4.0–10.5)

## 2012-09-08 LAB — TROPONIN I: Troponin I: <0.3 ng/mL

## 2012-09-08 LAB — URINE MICROSCOPIC-ADD ON

## 2012-09-08 LAB — APTT: aPTT: 30 s (ref 24–37)

## 2012-09-08 LAB — PREGNANCY, URINE: Preg Test, Ur: NEGATIVE

## 2012-09-08 LAB — URINALYSIS, ROUTINE W REFLEX MICROSCOPIC
Bilirubin Urine: NEGATIVE
Glucose, UA: NEGATIVE mg/dL
Ketones, ur: NEGATIVE mg/dL
Leukocytes, UA: NEGATIVE
Nitrite: NEGATIVE
Protein, ur: NEGATIVE mg/dL
Specific Gravity, Urine: 1.007 (ref 1.005–1.030)
Urobilinogen, UA: 0.2 mg/dL (ref 0.0–1.0)
pH: 6 (ref 5.0–8.0)

## 2012-09-08 LAB — PROTIME-INR
INR: 0.98 (ref 0.00–1.49)
Prothrombin Time: 12.9 s (ref 11.6–15.2)

## 2012-09-08 MED ORDER — IBUPROFEN 600 MG PO TABS: 600.0000 mg | ORAL_TABLET | Freq: Four times a day (QID) | ORAL | Status: DC | PRN

## 2012-09-08 MED ORDER — LORAZEPAM 2 MG/ML IJ SOLN: 1.0000 mg | Freq: Once | INTRAMUSCULAR | Status: DC

## 2012-09-08 MED ORDER — ONDANSETRON HCL 4 MG/2ML IJ SOLN
4.0000 mg | Freq: Once | INTRAMUSCULAR | Status: AC
  Administered 2012-09-08: 4 mg via INTRAVENOUS
  Filled 2012-09-08: qty 2

## 2012-09-08 MED ORDER — MORPHINE SULFATE 4 MG/ML IJ SOLN
4.0000 mg | Freq: Once | INTRAMUSCULAR | Status: DC
  Filled 2012-09-08: qty 1

## 2012-09-08 MED ORDER — LORAZEPAM 1 MG PO TABS
1.0000 mg | ORAL_TABLET | Freq: Once | ORAL | Status: DC
  Filled 2012-09-08: qty 1

## 2012-09-08 MED ORDER — METHOCARBAMOL 500 MG PO TABS: 500.0000 mg | ORAL_TABLET | Freq: Two times a day (BID) | ORAL | Status: DC

## 2012-09-08 MED ORDER — LORAZEPAM 2 MG/ML IJ SOLN
1.0000 mg | Freq: Once | INTRAMUSCULAR | Status: AC
  Administered 2012-09-08: 1 mg via INTRAVENOUS
  Filled 2012-09-08: qty 1

## 2012-09-08 MED ORDER — SODIUM CHLORIDE 0.9 % IV BOLUS (SEPSIS): 1000.0000 mL | Freq: Once | INTRAVENOUS | Status: DC

## 2012-09-08 MED ORDER — FENTANYL CITRATE 0.05 MG/ML IJ SOLN
50.0000 ug | Freq: Once | INTRAMUSCULAR | Status: AC
  Administered 2012-09-08: 50 ug via INTRAVENOUS
  Filled 2012-09-08: qty 2

## 2012-09-08 MED ORDER — ONDANSETRON 4 MG PO TBDP: ORAL_TABLET | ORAL | Status: DC

## 2012-09-08 MED ORDER — ONDANSETRON HCL 4 MG/2ML IJ SOLN: 4.0000 mg | Freq: Once | INTRAMUSCULAR | Status: DC

## 2012-09-08 MED ORDER — MORPHINE SULFATE 4 MG/ML IJ SOLN: 4.0000 mg | Freq: Once | INTRAMUSCULAR | Status: DC

## 2012-09-08 MED ORDER — ONDANSETRON 4 MG PO TBDP
4.0000 mg | ORAL_TABLET | Freq: Once | ORAL | Status: AC
  Administered 2012-09-08: 4 mg via ORAL
  Filled 2012-09-08: qty 1

## 2012-09-08 MED ORDER — OXYCODONE-ACETAMINOPHEN 5-325 MG PO TABS: 2.0000 | ORAL_TABLET | ORAL | Status: DC | PRN

## 2012-09-08 NOTE — ED Notes (Signed)
Family at bedside. 

## 2012-09-08 NOTE — ED Notes (Signed)
Pt does not want d/c vitals. Pt just wants to go home.

## 2012-09-08 NOTE — Discharge Instructions (Signed)
Muscle Cramps  Muscle cramps are due to sudden involuntary muscle contraction. This means you have no control over the tightening of a muscle (or muscles). Often there are no obvious causes. Muscle cramps may occur with overexertion. They may also occur with chilling of the muscles. An example of a muscle chilling activity is swimming. It is uncommon for cramps to be due to a serious underlying disorder. In most cases, muscle cramps improve (or leave) within minutes.  CAUSES   Some common causes are:   Injury.   Infections, especially viral.   Abnormal levels of the salts and ions in your blood (electrolytes). This could happen if you are taking water pills (diuretics).   Blood vessel disease where not enough blood is getting to the muscles (intermittent claudication).  Some uncommon causes are:   Side effects of some medicine (such as lithium).   Alcohol abuse.   Diseases where there is soreness (inflammation) of the muscular system.  HOME CARE INSTRUCTIONS    It may be helpful to massage, stretch, and relax the affected muscle.   Taking a dose of over-the-counter diphenhydramine is helpful for night leg cramps.  SEEK MEDICAL CARE IF:   Cramps are frequent and not relieved with medicine.  MAKE SURE YOU:    Understand these instructions.   Will watch your condition.   Will get help right away if you are not doing well or get worse.  Document Released: 09/19/2001 Document Revised: 06/22/2011 Document Reviewed: 03/21/2008  [employer] Patient Information 2014 [employer].

## 2012-09-08 NOTE — ED Notes (Signed)
Patient transported to CT 

## 2012-09-08 NOTE — ED Notes (Signed)
Pt states she felt fine when she woke up this AM. Pt went to appointment with neurologist at 1030 and felt nausea, dizzy and light headed when they brought her back to see the doctor at that time. During exam she started having HA that she describes as 10/10 pressure on the top of her head. Also c/o blurry vision. Will notify MD.

## 2012-09-08 NOTE — ED Provider Notes (Signed)
History     CSN: [id number]  Arrival date & time 09/08/12  1140   First MD Initiated Contact with Patient 09/08/12 1200      Chief Complaint  Patient presents with  . Migraine  . Numbness    (Consider location/radiation/quality/duration/timing/severity/associated sxs/prior treatment) HPI Pt with history of chronic neck and back pain, Lupus, anxiety and migraine presents from Neurosurgery office. States she was being seen for neck pain and LUE paresthesias. Had MRI c-spine performed which was normal. Dr Wynetta Emery was telling her of her normal result and no surgical intervention was indicated and that she likely needed outpt PT. Pt states she started having headache and L arm spasms at that point. She states she began having L chest pain. +nausea and photophobia. Pt states the HA is at the top of her head and describes as throbbing.  Past Medical History  Diagnosis Date  . GERD (gastroesophageal reflux disease)   . Endometriosis   . Lupus   . Back pain   . Plantar fasciitis   . Thyroid cyst   . Lumbar herniated disc   . Anxiety   . Nephrolithiasis   . Pancreatitis   . Obesity     Past Surgical History  Procedure Laterality Date  . Laparoscopy      x3  . Thyroid cyst excision      Dr. Rolly Salter, [place]  . Appendectomy    . Cholecystectomy      Family History  Problem Relation Age of Onset  . Depression Mother     Bi-polar  . Hyperlipidemia    . Hypertension    . Asthma Mother   . Allergies Mother   . Allergies Sister   . Rheum arthritis Maternal Grandmother   . Heart disease      History  Substance Use Topics  . Smoking status: Never Smoker   . Smokeless tobacco: Never Used  . Alcohol Use: .5 - 1 oz/week    1-2 drink(s) per week    OB History   Grav Para Term Preterm Abortions TAB SAB Ect Mult Living   5 1              Review of Systems  Constitutional: Negative for fever and chills.  HENT: Positive for neck pain. Negative for neck stiffness.   Eyes:  Positive for photophobia.  Gastrointestinal: Positive for nausea. Negative for vomiting, abdominal pain and diarrhea.  Genitourinary: Negative for dysuria.  Musculoskeletal: Positive for myalgias and arthralgias. Negative for back pain.  Skin: Negative for rash and wound.  Neurological: Positive for headaches. Negative for dizziness, syncope, weakness and numbness.  Psychiatric/Behavioral: The patient is nervous/anxious.   All other systems reviewed and are negative.    Allergies  Prednisone; Prochlorperazine edisylate; Hydrocodone-acetaminophen; and Morphine and related  Home Medications   Current Outpatient Rx  Name  Route  Sig  Dispense  Refill  . albuterol (VENTOLIN HFA) 108 (90 BASE) MCG/ACT inhaler   Inhalation   Inhale 2 puffs into the lungs every 4 (four) hours as needed for wheezing.          Marland Kitchen ALPRAZolam (XANAX) 1 MG tablet   Oral   Take 1 mg by mouth at bedtime as needed for sleep.          . clonazePAM (KLONOPIN) 1 MG tablet   Oral   Take 1 mg by mouth 3 (three) times daily. Two tablets at bedtime         . esomeprazole (NEXIUM) 40  MG capsule   Oral   Take 40 mg by mouth daily.          . hydroxychloroquine (PLAQUENIL) 200 MG tablet   Oral   Take 1 tablet (200 mg total) by mouth daily.   30 tablet   5   . ibuprofen (ADVIL,MOTRIN) 800 MG tablet   Oral   Take 800 mg by mouth every 12 (twelve) hours as needed for pain.         Marland Kitchen loratadine (CLARITIN) 10 MG tablet   Oral   Take 10 mg by mouth daily.         . meloxicam (MOBIC) 7.5 MG tablet   Oral   Take 7.5 mg by mouth daily as needed for pain.         . Multiple Vitamins-Minerals (MULTIVITAMIN PO)   Oral   Take 1 tablet by mouth daily.         . traMADol (ULTRAM) 50 MG tablet   Oral   Take 50 mg by mouth every 6 (six) hours as needed for pain.         Marland Kitchen zolpidem (AMBIEN) 10 MG tablet   Oral   Take 10 mg by mouth at bedtime as needed for sleep.          Marland Kitchen ibuprofen  (ADVIL,MOTRIN) 600 MG tablet   Oral   Take 1 tablet (600 mg total) by mouth every 6 (six) hours as needed for pain.   30 tablet   0   . methocarbamol (ROBAXIN) 500 MG tablet   Oral   Take 1 tablet (500 mg total) by mouth 2 (two) times daily.   20 tablet   0   . ondansetron (ZOFRAN ODT) 4 MG disintegrating tablet      4mg  ODT q4 hours prn nausea/vomit   4 tablet   0   . oxyCODONE-acetaminophen (PERCOCET) 5-325 MG per tablet   Oral   Take 2 tablets by mouth every 4 (four) hours as needed for pain.   10 tablet   0     BP 124/76  Pulse 78  Temp(Src) 98.5 F (36.9 C) (Oral)  Resp 16  SpO2 100%  LMP 08/12/2012  Physical Exam  Nursing note and vitals reviewed. Constitutional: She is oriented to person, place, and time. She appears well-developed and well-nourished. No distress.  HENT:  Head: Normocephalic and atraumatic.  Mouth/Throat: Oropharynx is clear and moist.  Eyes: EOM are normal. Pupils are equal, round, and reactive to light.  No photophobia on exam  Neck: Normal range of motion. Neck supple.  No meningismus    Cardiovascular: Normal rate and regular rhythm.   Pulmonary/Chest: Effort normal and breath sounds normal. No respiratory distress. She has no wheezes. She has no rales. She exhibits tenderness (Chest pain reproduced with palpatio of left lateral chest wall.).  Abdominal: Soft. Bowel sounds are normal. She exhibits no mass. There is no tenderness. There is no rebound and no guarding.  Musculoskeletal: Normal range of motion. She exhibits no edema.  Carpopedal spasm to L hand, pain with ROM. No deformity. Good cap refill  Neurological: She is alert and oriented to person, place, and time.  C/o decreased sensation to 4th and 5th digit of L hand. Not actively ranging hand presumedly due to pain. All other ext sensation intact, 5/5 strength.   Skin: Skin is warm and dry. No rash noted. No erythema.  Psychiatric:  Intermittently crying    ED Course   Procedures (including  critical care time)  Labs Reviewed  CBC WITH DIFFERENTIAL - Abnormal; Notable for the following:    Hemoglobin 11.9 (*)    HCT 34.4 (*)    All other components within normal limits  COMPREHENSIVE METABOLIC PANEL - Abnormal; Notable for the following:    Glucose, Bld 113 (*)    Total Bilirubin 0.2 (*)    All other components within normal limits  URINALYSIS, ROUTINE W REFLEX MICROSCOPIC - Abnormal; Notable for the following:    Hgb urine dipstick LARGE (*)    All other components within normal limits  PREGNANCY, URINE  TROPONIN I  PROTIME-INR  APTT  URINE MICROSCOPIC-ADD ON   Ct Head Wo Contrast  09/08/2012   *RADIOLOGY REPORT*  Clinical Data: Migraine headaches.  New onset of nausea and dizziness following an employment with her neurologist today.  CT HEAD WITHOUT CONTRAST  Technique:  Contiguous axial images were obtained from the base of the skull through the vertex without contrast.  Comparison: CT head without contrast 12/11/2007.  Findings: No acute intracranial abnormality is present. Specifically, there is no evidence for acute infarct, hemorrhage, mass, hydrocephalus, or extra-axial fluid collection.  The paranasal sinuses and mastoid air cells are clear.  The globes and orbits are intact.  The osseous skull is intact.  IMPRESSION: Negative CT of the head.   Original Report Authenticated By: Marin Roberts, M.D.   Mr Cervical Spine Wo Contrast  09/08/2012   *RADIOLOGY REPORT*  Clinical Data: Neck pain and headache.  Worsening recently.  Left arm pain.  MRI CERVICAL SPINE WITHOUT CONTRAST  Technique:  Multiplanar and multiecho pulse sequences of the cervical spine, to include the craniocervical junction and cervicothoracic junction, were obtained according to standard protocol without intravenous contrast.  Comparison: 09/01/2012  Findings: Straightening of the normal cervical lordosis is once again noted, and could be secondary to muscle spasm.  The  craniocervical junction appears unremarkable.  No significant vertebral subluxation.  Inversion recovery weighted images demonstrate no significant abnormal vertebral or periligamentous edema.  No significant abnormal cord signal is identified.  Additional findings at individual levels are as follows:  C2-3:  Unremarkable.  C3-4:  Unremarkable.  C4-5:  Unremarkable.  C5-6:  Unremarkable.  C6-7:  Unremarkable.  C7-T1:  Unremarkable.  IMPRESSION:  1.  Straightening of the cervical spine, possibly due to muscle spasm.  No impingement observed.  No significant change from the prior cervical MRI performed 1 week ago.   Original Report Authenticated By: Gaylyn Rong, M.D.     1. Muscle spasms of neck   2. Headache      Date: 09/08/2012  Rate: 82  Rhythm: normal sinus rhythm  QRS Axis: normal  Intervals: normal  ST/T Wave abnormalities: nonspecific T wave changes  Conduction Disutrbances:none  Narrative Interpretation:   Old EKG Reviewed: unchanged    MDM   Discussed pt symptoms with Dr Amada Jupiter. Suggested MRI. Will see in ED.   Eval'd by Dr Amada Jupiter. Thinks symptoms likely conversion d/c, complex migraine or cervical radiculopathy. If MRI normal may be d/c'd home.      Loren Racer, MD 09/08/12 1901

## 2012-09-08 NOTE — Consult Note (Signed)
Referring Physician: Dr. Ranae Palms,  ED physician    Chief Complaint:L sided headache, neck pain, and L arm pain  HPI:  Donna Haas is an 37 y.o. female obese white female with PMH of SLE on Plaquinol, migraines, endometriosis, GERD, and back pain presenting to the ED from her neurosurgeon's office today, Dr. Wynetta Emery, with complaints of sudden onset left sided headache, worsening neck pain, and severe left arm pain.  She explains that on May 2nd, 2014 she developed sudden neck pain with radiation down her left arm.  She went to the emergency room and CT Cspine at that time showed C4-C5 disc and endplate degeneration with small central disc protrusion.  Soft collar was applied and she followed up with neurosurgery as an outpatient.  She had an MRI done on 09/01/12 showing mild cervical kyphosis, no significant degenerative change and no disc protrusion or neural impingement.  She returned to Dr. Lonie Peak office today for follow up and was apparently being told her MRI results and likely need for outpatient PT instead of surgery.  She then subsequently developed a severe left sided headache, nausea, neck pain, light-headedness, with shooting pain up and down her left arm.  She endorses numbness and tingling in her left fingers, mainly localized to the thumb region and extending up the forearm.  She denies any vomiting or fall or LOC but does endorse photophobia.    Date last known well: 09/07/12-09/08/12 Time last known well: this morning prior to specialist office visit tPA Given: No  Past Medical History  Diagnosis Date  . GERD (gastroesophageal reflux disease)   . Endometriosis   . Lupus   . Back pain   . Plantar fasciitis   . Thyroid cyst   . Lumbar herniated disc   . Anxiety   . Nephrolithiasis   . Pancreatitis   . Obesity    Past Surgical History  Procedure Laterality Date  . Laparoscopy      x3  . Thyroid cyst excision      Dr. Rolly Salter, [place]  . Appendectomy    . Cholecystectomy      Family History  Problem Relation Age of Onset  . Depression Mother     Bi-polar  . Hyperlipidemia    . Hypertension    . Asthma Mother   . Allergies Mother   . Allergies Sister   . Rheum arthritis Maternal Grandmother    Social History:  reports that she has never smoked. She has never used smokeless tobacco. She reports that she drinks about 0.5 ounces of alcohol per week. She reports that she does not use illicit drugs.  Allergies:  Allergies  Allergen Reactions  . Prednisone Anaphylaxis  . Prochlorperazine Edisylate Anaphylaxis  . Hydrocodone-Acetaminophen Nausea Only  . Morphine And Related Nausea Only and Other (See Comments)    Mood swings, headache   Medications:  I have reviewed the patient's current medications. No current facility-administered medications for this encounter. Current outpatient prescriptions:albuterol (VENTOLIN HFA) 108 (90 BASE) MCG/ACT inhaler, Inhale 2 puffs into the lungs every 4 (four) hours as needed for wheezing. , Disp: , Rfl: ;  ALPRAZolam (XANAX) 1 MG tablet, Take 1 mg by mouth at bedtime as needed for sleep. , Disp: , Rfl: ;  clonazePAM (KLONOPIN) 1 MG tablet, Take 1 mg by mouth 3 (three) times daily. Two tablets at bedtime, Disp: , Rfl:  esomeprazole (NEXIUM) 40 MG capsule, Take 40 mg by mouth daily. , Disp: , Rfl: ;  hydroxychloroquine (PLAQUENIL) 200 MG  tablet, Take 1 tablet (200 mg total) by mouth daily., Disp: 30 tablet, Rfl: 5;  ibuprofen (ADVIL,MOTRIN) 800 MG tablet, Take 800 mg by mouth every 12 (twelve) hours as needed for pain., Disp: , Rfl: ;  loratadine (CLARITIN) 10 MG tablet, Take 10 mg by mouth daily., Disp: , Rfl:  meloxicam (MOBIC) 7.5 MG tablet, Take 7.5 mg by mouth daily as needed for pain., Disp: , Rfl: ;  Multiple Vitamins-Minerals (MULTIVITAMIN PO), Take 1 tablet by mouth daily., Disp: , Rfl: ;  traMADol (ULTRAM) 50 MG tablet, Take 50 mg by mouth every 6 (six) hours as needed for pain., Disp: , Rfl: ;  zolpidem (AMBIEN) 10 MG  tablet, Take 10 mg by mouth at bedtime as needed for sleep. , Disp: , Rfl:   Review of Systems:  Constitutional:  Denies fever, chills, diaphoresis, appetite change and fatigue.   HEENT:  Photophobia. Denies congestion, sore throat, rhinorrhea, sneezing, mouth sores, trouble swallowing, neck pain   Respiratory:  Denies SOB, DOE, cough, and wheezing.   Cardiovascular:  Denies palpitations and leg swelling.   Gastrointestinal:  Nausea. Denies vomiting, abdominal pain, diarrhea, constipation, blood in stool and abdominal distention.   Genitourinary:  Denies dysuria, urgency, frequency, hematuria, flank pain and difficulty urinating.   Musculoskeletal:  Neck and L arm pain. Denies back pain, joint swelling, and gait problem.   Skin:  Denies pallor, rash and wound.   Neurological:  Dizziness, light-headedness, numbness, and headache. Denies seizures, syncope, weakness   Neurologic Examination:  Blood pressure 124/76, pulse 87, temperature 98.5 F (36.9 C), temperature source Oral, resp. rate 15, last menstrual period 08/12/2012, SpO2 100.00%.  Mental Status: Alert, oriented, thought content appropriate.  Speech fluent without evidence of aphasia.  Able to follow 3 step commands without difficulty. Cranial Nerves: II: Discs flat bilaterally; Visual fields grossly normal, pupils equal, round, reactive to light and accommodation III,IV, VI: ptosis not present, extra-ocular motions intact bilaterally V,VII: smile symmetric, facial light touch sensation normal bilaterally VIII: hearing normal bilaterally IX,X: uvula elevates symmetrically XI: bilateral shoulder shrug XII: midline tongue extension Motor: Right : Upper extremity   5/5    Left:     Limited examination due to poor effort 4/2 severe pain complaints Upper extremity   4/5, gave intermittent effort. This weakness was spread through all muscle groups, not limited to one myotome, though was give-way in all areas tested.   Lower extremity    5/5     Lower extremity   5/5 Tone and bulk:normal tone throughout; no atrophy noted Sensory: Pinprick and light touch intact throughout, bilaterally with the exception of her leftthumb and lateral forearm Deep Tendon Reflexes: 2+ and symmetric throughout including biceps, triceps brachoradialis and patellae Plantars: Right: downgoing   Left: downgoing Cerebellar: normal finger-to-nose,  normal heel-to-shin test Gait: not assessed due to multiple medical monitors in ED setting  CV: pulses palpable throughout   Results for orders placed during the hospital encounter of 09/08/12 (from the past 48 hour(s))  CBC WITH DIFFERENTIAL     Status: Abnormal   Collection Time    09/08/12 12:24 PM      Result Value Range   WBC 8.5  4.0 - 10.5 K/uL   RBC 3.93  3.87 - 5.11 MIL/uL   Hemoglobin 11.9 (*) 12.0 - 15.0 g/dL   HCT 13.0 (*) 86.5 - 78.4 %   MCV 87.5  78.0 - 100.0 fL   MCH 30.3  26.0 - 34.0 pg   MCHC 34.6  30.0 - 36.0 g/dL   RDW 95.2  84.1 - 32.4 %   Platelets 244  150 - 400 K/uL   Neutrophils Relative % 74  43 - 77 %   Neutro Abs 6.3  1.7 - 7.7 K/uL   Lymphocytes Relative 19  12 - 46 %   Lymphs Abs 1.6  0.7 - 4.0 K/uL   Monocytes Relative 6  3 - 12 %   Monocytes Absolute 0.5  0.1 - 1.0 K/uL   Eosinophils Relative 1  0 - 5 %   Eosinophils Absolute 0.1  0.0 - 0.7 K/uL   Basophils Relative 0  0 - 1 %   Basophils Absolute 0.0  0.0 - 0.1 K/uL  COMPREHENSIVE METABOLIC PANEL     Status: Abnormal   Collection Time    09/08/12 12:24 PM      Result Value Range   Sodium 138  135 - 145 mEq/L   Potassium 3.5  3.5 - 5.1 mEq/L   Chloride 103  96 - 112 mEq/L   CO2 21  19 - 32 mEq/L   Glucose, Bld 113 (*) 70 - 99 mg/dL   BUN 7  6 - 23 mg/dL   Creatinine, Ser 4.01  0.50 - 1.10 mg/dL   Calcium 9.4  8.4 - 02.7 mg/dL   Total Protein 7.1  6.0 - 8.3 g/dL   Albumin 4.1  3.5 - 5.2 g/dL   AST 22  0 - 37 U/L   ALT 24  0 - 35 U/L   Alkaline Phosphatase 64  39 - 117 U/L   Total Bilirubin 0.2 (*) 0.3  - 1.2 mg/dL   GFR calc non Af Amer >90  >90 mL/min   GFR calc Af Amer >90  >90 mL/min   Comment:            The eGFR has been calculated     using the CKD EPI equation.     This calculation has not been     validated in all clinical     situations.     eGFR's persistently     <90 mL/min signify     possible Chronic Kidney Disease.  PROTIME-INR     Status: None   Collection Time    09/08/12 12:24 PM      Result Value Range   Prothrombin Time 12.9  11.6 - 15.2 seconds   INR 0.98  0.00 - 1.49  APTT     Status: None   Collection Time    09/08/12 12:24 PM      Result Value Range   aPTT 30  24 - 37 seconds  TROPONIN I     Status: None   Collection Time    09/08/12 12:25 PM      Result Value Range   Troponin I <0.30  <0.30 ng/mL   Comment:            Due to the release kinetics of cTnI,     a negative result within the first hours     of the onset of symptoms does not rule out     myocardial infarction with certainty.     If myocardial infarction is still suspected,     repeat the test at appropriate intervals.  URINALYSIS, ROUTINE W REFLEX MICROSCOPIC     Status: Abnormal   Collection Time    09/08/12 12:45 PM      Result Value Range   Color, Urine YELLOW  YELLOW  APPearance CLEAR  CLEAR   Specific Gravity, Urine 1.007  1.005 - 1.030   pH 6.0  5.0 - 8.0   Glucose, UA NEGATIVE  NEGATIVE mg/dL   Hgb urine dipstick LARGE (*) NEGATIVE   Bilirubin Urine NEGATIVE  NEGATIVE   Ketones, ur NEGATIVE  NEGATIVE mg/dL   Protein, ur NEGATIVE  NEGATIVE mg/dL   Urobilinogen, UA 0.2  0.0 - 1.0 mg/dL   Nitrite NEGATIVE  NEGATIVE   Leukocytes, UA NEGATIVE  NEGATIVE  PREGNANCY, URINE     Status: None   Collection Time    09/08/12 12:45 PM      Result Value Range   Preg Test, Ur NEGATIVE  NEGATIVE   Comment:            THE SENSITIVITY OF THIS     METHODOLOGY IS >20 mIU/mL.  URINE MICROSCOPIC-ADD ON     Status: None   Collection Time    09/08/12 12:45 PM      Result Value Range    Squamous Epithelial / LPF RARE  RARE   WBC, UA 0-2  <3 WBC/hpf   RBC / HPF 3-6  <3 RBC/hpf   Bacteria, UA RARE  RARE   Ct Head Wo Contrast  09/08/2012   *RADIOLOGY REPORT*  Clinical Data: Migraine headaches.  New onset of nausea and dizziness following an employment with her neurologist today.  CT HEAD WITHOUT CONTRAST  Technique:  Contiguous axial images were obtained from the base of the skull through the vertex without contrast.  Comparison: CT head without contrast 12/11/2007.  Findings: No acute intracranial abnormality is present. Specifically, there is no evidence for acute infarct, hemorrhage, mass, hydrocephalus, or extra-axial fluid collection.  The paranasal sinuses and mastoid air cells are clear.  The globes and orbits are intact.  The osseous skull is intact.  IMPRESSION: Negative CT of the head.   Original Report Authenticated By: Marin Roberts, M.D.   Assessment and plan discussed with Dr. Amada Jupiter and they are in agreement.    Signed: Darden Palmer, MD PGY-I, Internal Medicine Resident Pager: [phone]  09/08/2012,4:52 PM  I have seen the patient and reviewed the above note, I have made appropriate changes.    Assessment: 37 y.o. female with PMH migraines and SLE presenting with sudden onset of left sided headache, neck pain, and shooting left arm pain.  CT head on admission negative. MRI from cervical spine from 09/01/12 significant only for mild cervical kyphosis.  Concern for possible complex migraine. Her radiating symptoms and C6 distribution numbness are concerning for neck pathology, though headache sounds more migrainous. Will proceed with MRI of neck in case of possible disc protrusion or neural impingement has worsened causing symptoms.     Stroke Risk Factors - family history  Plan: 1. MRI neck 2. Telemetry monitoring 3. Pain control: would treat as complicated migraine if MRI neck is negative.   Ritta Slot, MD [facility] [phone]  If 7pm- 7am, please page neurology on call at [phone].

## 2012-09-08 NOTE — ED Notes (Signed)
Per EMS pt was at [facility] today for follow-up appointment for arthritis in her neck. Pt started having severe migraine that she describes as pressure with associated symptoms of photophobia, nausea and dizziness. Pt has hx of migraines but has not had one in 5 years. Pt also c/o numbness to left arm and tingling to right fingers from her lupus. Left hand is contracted from pain which pt has had before from pain.

## 2012-09-08 NOTE — ED Notes (Signed)
Patient transported to MRI 

## 2014-01-30 ENCOUNTER — Ambulatory Visit: Payer: Self-pay | Admitting: Family Medicine

## 2014-02-12 ENCOUNTER — Encounter (HOSPITAL_COMMUNITY): Payer: Self-pay | Admitting: Emergency Medicine

## 2014-10-11 ENCOUNTER — Other Ambulatory Visit: Payer: Self-pay | Admitting: Family Medicine

## 2014-10-11 ENCOUNTER — Telehealth: Payer: Self-pay | Admitting: Family Medicine

## 2014-10-11 DIAGNOSIS — M542 Cervicalgia: Secondary | ICD-10-CM

## 2014-10-11 MED ORDER — OXYCODONE-ACETAMINOPHEN 5-325 MG PO TABS: 2.0000 | ORAL_TABLET | Freq: Two times a day (BID) | ORAL | Status: DC | PRN

## 2014-10-11 NOTE — Telephone Encounter (Signed)
Donna Haas was referred to Rheumatologist 06/06/14, it is her responsibility to make sure she secures the appointment. Ask her to call [facility] and see if a new referral needs to be placed.  If her lupus is flaring it may be beyond my expertise as I have expressed to her previously.  I have printed out her oxycodone-aceto 5-325mg  RX for her to pick up for her pain.

## 2014-10-11 NOTE — Telephone Encounter (Signed)
Patient states she is having issues with her Lupus and she is also having pain and she does not have any medication. Patient would like to know if she could be worked in this afternoon or tomorrow. Patient has an appt with the Neurologist in July. Patient will be out of town the week after the 4th of July.

## 2014-10-12 ENCOUNTER — Other Ambulatory Visit: Payer: Self-pay

## 2014-10-12 DIAGNOSIS — L989 Disorder of the skin and subcutaneous tissue, unspecified: Secondary | ICD-10-CM

## 2014-10-12 MED ORDER — DOXYCYCLINE HYCLATE 50 MG PO CAPS: 50.0000 mg | ORAL_CAPSULE | Freq: Two times a day (BID) | ORAL | Status: DC

## 2014-10-12 NOTE — Telephone Encounter (Signed)
Patient stated that she needs a refill of this for her face due to the lupus flaring up and breaking her out.

## 2014-10-12 NOTE — Telephone Encounter (Signed)
Patient stated she has contacted her rheumatologist and cannot get in until 10/27/14. Patient was encouraged to keep that appointment but was also informed that a rx was waiting upfront for her to pick up. She stated she will be by around 12:30 or so. She then asked for a rx for Doxycycline for her face. I told her that I will pass the pass the message along for Dr. Nadine Counts to review and she said ok.

## 2014-10-28 ENCOUNTER — Other Ambulatory Visit: Payer: Self-pay | Admitting: Family Medicine

## 2014-10-28 DIAGNOSIS — M329 Systemic lupus erythematosus, unspecified: Secondary | ICD-10-CM

## 2014-11-14 ENCOUNTER — Encounter: Payer: Self-pay | Admitting: Family Medicine

## 2014-11-14 ENCOUNTER — Other Ambulatory Visit: Payer: Self-pay

## 2014-11-14 ENCOUNTER — Ambulatory Visit (INDEPENDENT_AMBULATORY_CARE_PROVIDER_SITE_OTHER): Payer: Medicare Other | Admitting: Family Medicine

## 2014-11-14 VITALS — BP 118/72 | HR 120 | Temp 98.3°F | Resp 18 | Ht 60.0 in | Wt 184.5 lb

## 2014-11-14 DIAGNOSIS — L930 Discoid lupus erythematosus: Secondary | ICD-10-CM | POA: Diagnosis not present

## 2014-11-14 DIAGNOSIS — B379 Candidiasis, unspecified: Secondary | ICD-10-CM

## 2014-11-14 DIAGNOSIS — T3695XA Adverse effect of unspecified systemic antibiotic, initial encounter: Principal | ICD-10-CM

## 2014-11-14 DIAGNOSIS — J0100 Acute maxillary sinusitis, unspecified: Secondary | ICD-10-CM | POA: Diagnosis not present

## 2014-11-14 DIAGNOSIS — R21 Rash and other nonspecific skin eruption: Secondary | ICD-10-CM | POA: Diagnosis not present

## 2014-11-14 MED ORDER — FLUCONAZOLE 150 MG PO TABS: ORAL_TABLET | ORAL | Status: DC

## 2014-11-14 MED ORDER — DOXYCYCLINE HYCLATE 100 MG PO CAPS: 100.0000 mg | ORAL_CAPSULE | Freq: Two times a day (BID) | ORAL | Status: DC

## 2014-11-14 MED ORDER — HYDROXYCHLOROQUINE SULFATE 200 MG PO TABS: 200.0000 mg | ORAL_TABLET | Freq: Every day | ORAL | Status: DC

## 2014-11-14 MED ORDER — DEXAMETHASONE SODIUM PHOSPHATE 10 MG/ML IJ SOLN: 30.0000 mg | Freq: Once | INTRAMUSCULAR | Status: DC

## 2014-11-14 MED ORDER — DEXAMETHASONE SODIUM PHOSPHATE 100 MG/10ML IJ SOLN
20.0000 mg | Freq: Once | INTRAMUSCULAR | Status: AC
  Administered 2014-11-14: 20 mg via INTRAMUSCULAR

## 2014-11-14 NOTE — Telephone Encounter (Signed)
Patient called stating that she needed a diflucan since she was placed on an atb. She ususally has to take 1 and repeat the same dose in a week.

## 2014-11-14 NOTE — Progress Notes (Signed)
Name: Donna Haas   MRN: [id number]    DOB: [birth year]   Date:11/14/2014       Progress Note  Subjective  Chief Complaint  Chief Complaint  Patient presents with  . Rash    patient is having a lupus falre up that is showing on her face  . Sinusitis    HPI  Donna Haas is a 39 year old female with a known history of several medical problems including SLE, Carpal tunnel syndrome bilaterally, GAD, chronic cervical spine pain with radiculopathy, eczema, GERD, thyroid mass, Endometriosis, chronic migraine, Asthma, history of calcium oxalate kidney stones who presents to day for follow up of her ongoing issues with Lupus and a rash on her face that she associates with her Lupus disorder and Rosacea.  Donna Haas is also having issues with a intermittent rash located all over her face for over 1 year now. The rash is usually comprised of flat red lesions that spontaneously appear on her face not associated with sun exposure, fevers, chills, worsening myalgias or arthralgias. Stress can make the lesions worse while symptoms are not exacerbated by food, cold, heat, humidity or personal care products. More recently  However she has had a brown lacy rash on her face and scattered macular brown lesion on her body. She has tried several topical moisturizers and topical corticosteroids which have not helped much. In the past Doxycycline and Dexamethasone IM has worked. She has been referred to Dermatology and Rheumatology in the past but has not established care with these specialists as she had been moving homes and they were not able to get in contact with her. Has been out of her plaquenil and is requesting refill. She also complains of sinus congestion and pressure today associated with ear fullness. No fevers, chills, headaches, cough.    Patient Active Problem List   Diagnosis Date Noted  . Headache 09/08/2012  . Left arm pain 09/08/2012  . Neck pain 09/08/2012  . BIPOLAR DISORDER UNSPECIFIED 05/24/2009   . MOLE 02/22/2009  . INSOMNIA 09/21/2008  . FOOT SPRAIN 09/21/2008  . ACUTE SINUSITIS, UNSPECIFIED 05/08/2008  . PANCREATITIS, HX OF 04/17/2008  . NEPHROLITHIASIS, HX OF 04/17/2008  . ANXIETY 02/16/2008  . THYROID NODULE 01/12/2008  . PARESTHESIA 12/14/2007  . FOLLICULITIS 73/71/0626  . OBSTRUCTIVE SLEEP APNEA 10/03/2007  . HYPERSOMNIA 07/13/2007  . THYROID CYST 05/30/2007  . GERD 05/30/2007  . ENDOMETRIOSIS OF OTHER SPECIFIED SITES 05/30/2007  . Lupus erythematosus 05/30/2007  . HERNIATED LUMBAR [place] 05/30/2007  . BACK PAIN 05/30/2007  . PLANTAR FASCIITIS, BILATERAL 05/30/2007  . ABDOMINAL BLOATING 05/30/2007  . ABDOMINAL PAIN OTHER SPECIFIED SITE 05/30/2007  . SNORING, HX OF 05/30/2007    History  Substance Use Topics  . Smoking status: Never Smoker   . Smokeless tobacco: Never Used  . Alcohol Use: 0.6 - 1.2 oz/week    1-2 Standard drinks or equivalent per week     Current outpatient prescriptions:  .  clonazePAM (KLONOPIN) 1 MG tablet, Take 1 mg by mouth 3 (three) times daily. Two tablets at bedtime, Disp: , Rfl:  .  doxycycline (VIBRAMYCIN) 50 MG capsule, Take 1 capsule (50 mg total) by mouth 2 (two) times daily., Disp: 20 capsule, Rfl: 0 .  esomeprazole (NEXIUM) 40 MG capsule, Take 40 mg by mouth daily. , Disp: , Rfl:  .  ibuprofen (ADVIL,MOTRIN) 800 MG tablet, Take 800 mg by mouth every 12 (twelve) hours as needed for pain., Disp: , Rfl:  .  loratadine (CLARITIN)  10 MG tablet, Take 10 mg by mouth daily., Disp: , Rfl:  .  methocarbamol (ROBAXIN) 500 MG tablet, Take 1 tablet (500 mg total) by mouth 2 (two) times daily., Disp: 20 tablet, Rfl: 0 .  Multiple Vitamins-Minerals (MULTIVITAMIN PO), Take 1 tablet by mouth daily., Disp: , Rfl:  .  ondansetron (ZOFRAN ODT) 4 MG disintegrating tablet, 4mg  ODT q4 hours prn nausea/vomit, Disp: 4 tablet, Rfl: 0 .  oxyCODONE-acetaminophen (PERCOCET) 5-325 MG per tablet, Take 2 tablets by mouth 2 (two) times daily as needed., Disp: 60  tablet, Rfl: 0 .  rizatriptan (MAXALT) 5 MG tablet, Take by mouth., Disp: , Rfl:  .  zolpidem (AMBIEN) 10 MG tablet, Take 10 mg by mouth at bedtime as needed for sleep. , Disp: , Rfl:  .  albuterol (VENTOLIN HFA) 108 (90 BASE) MCG/ACT inhaler, Inhale 2 puffs into the lungs every 4 (four) hours as needed for wheezing. , Disp: , Rfl:  .  ALPRAZolam (XANAX) 1 MG tablet, Take 1 mg by mouth at bedtime as needed for sleep. , Disp: , Rfl:  .  hydroxychloroquine (PLAQUENIL) 200 MG tablet, TAKE 1 TABLET BY MOUTH ONCE DAILY, Disp: 30 tablet, Rfl: 5  Past Surgical History  Procedure Laterality Date  . Laparoscopy      x3  . Thyroid cyst excision      Dr. Trena Platt, [place]  . Appendectomy    . Cholecystectomy      Family History  Problem Relation Age of Onset  . Depression Mother     Bi-polar  . Hyperlipidemia    . Hypertension    . Asthma Mother   . Allergies Mother   . Allergies Sister   . Rheum arthritis Maternal Grandmother   . Heart disease      Allergies  Allergen Reactions  . Prednisone Anaphylaxis  . Prochlorperazine Edisylate Anaphylaxis  . Hydrocodone-Acetaminophen Nausea Only  . Morphine And Related Nausea Only and Other (See Comments)    Mood swings, headache     Review of Systems  CONSTITUTIONAL: No significant weight changes, fever, chills, weakness or fatigue.  HEENT:  - Eyes: No visual changes.  - Ears: No auditory changes. No pain.  - Nose: Yes sneezing, congestion, sinus pressure - Throat: Yes sore throat. No changes in swallowing. SKIN: Yes rash.  CARDIOVASCULAR: No chest pain, chest pressure or chest discomfort. No palpitations or edema.  RESPIRATORY: No shortness of breath, cough or sputum.  GASTROINTESTINAL: No anorexia, nausea, vomiting. No changes in bowel habits. No abdominal pain or blood.  GENITOURINARY: No dysuria. No frequency. No discharge. NEUROLOGICAL: No headache, dizziness, syncope, paralysis, ataxia, numbness or tingling in the extremities.  No memory changes. No change in bowel or bladder control.  MUSCULOSKELETAL: Chronic joint pain. No muscle pain. HEMATOLOGIC: No anemia, bleeding or bruising.  LYMPHATICS: No enlarged lymph nodes.  PSYCHIATRIC: Yes change in mood. No change in sleep pattern.  ENDOCRINOLOGIC: No reports of sweating, cold or heat intolerance. No polyuria or polydipsia.     Objective  BP 118/72 mmHg  Pulse 120  Temp(Src) 98.3 F (36.8 C) (Oral)  Resp 18  Ht 5' (1.524 m)  Wt 184 lb 8 oz (83.689 kg)  BMI 36.03 kg/m2  SpO2 99%  LMP 10/31/2014 (Approximate) Body mass index is 36.03 kg/(m^2).  Physical Exam  Constitutional: Patient appears obese and well-nourished. In no distress.  HEENT:  - Head: Normocephalic and atraumatic.  - Ears: Bilateral TMs gray, no erythema or effusion - Nose: Nasal mucosa moist -  Mouth/Throat: Oropharynx is clear and moist. No tonsillar hypertrophy or erythema. No post nasal drainage.  - Eyes: Conjunctivae clear, EOM movements normal. PERRLA. No scleral icterus.  Neck: Normal range of motion. Neck supple. No JVD present. No thyromegaly present.  Cardiovascular: Normal rate, regular rhythm and normal heart sounds.  No murmur heard.  Pulmonary/Chest: Effort normal and breath sounds normal. No respiratory distress. Musculoskeletal: Normal range of motion bilateral LE. ROM restricted in bilateral UE at 100 degrees abduction. Positive Spurling sign at C-spine.  Peripheral vascular: Bilateral LE no edema. Neurological: CN II-XII grossly intact with no focal deficits. Alert and oriented to person, place, and time. Coordination, balance, strength, speech and gait are normal.  Skin: Skin is warm and dry. Scattered brown flat macular almost lacy rash on face sparing the eye lids and neck then re-appearing on anterior chest wall upper arms and back.  Psychiatric: Patient has as table mood and affect, appears to be depressed today. Behavior is normal in office today. Judgment and thought  content normal in office today.   No results found for this or any previous visit (from the past 2160 hour(s)).   Assessment & Plan  1. Discoid lupus erythematosus I will refer her again to Rheumatology and have encouraged her to make more efforts to establish care. Rash may or may not be Lupus related but it is changing in distribution and quality. Plan is to refill Plaquenil 200mg  a day, Dexamethasone 30mg  IM x1 today in office along with Doxycyline 100mg  twice a day.  - Ambulatory referral to Rheumatology - hydroxychloroquine (PLAQUENIL) 200 MG tablet; Take 1 tablet (200 mg total) by mouth daily.  Dispense: 30 tablet; Refill: 3 - dexamethasone (DECADRON) injection 30 mg; Inject 3 mLs (30 mg total) into the muscle once. - dexamethasone (DECADRON) injection 20 mg; Inject 2 mLs (20 mg total) into the muscle once.  2. Acute maxillary sinusitis, recurrence not specified Nasal saline spray, anti-histamine recommended.  - doxycycline (VIBRAMYCIN) 100 MG capsule; Take 1 capsule (100 mg total) by mouth 2 (two) times daily.  Dispense: 20 capsule; Refill: 0 - dexamethasone (DECADRON) injection 30 mg; Inject 3 mLs (30 mg total) into the muscle once. - dexamethasone (DECADRON) injection 20 mg; Inject 2 mLs (20 mg total) into the muscle once.  3. Rash and nonspecific skin eruption  - doxycycline (VIBRAMYCIN) 100 MG capsule; Take 1 capsule (100 mg total) by mouth 2 (two) times daily.  Dispense: 20 capsule; Refill: 0 - Ambulatory referral to Rheumatology - hydroxychloroquine (PLAQUENIL) 200 MG tablet; Take 1 tablet (200 mg total) by mouth daily.  Dispense: 30 tablet; Refill: 3 - dexamethasone (DECADRON) injection 30 mg; Inject 3 mLs (30 mg total) into the muscle once. - dexamethasone (DECADRON) injection 20 mg; Inject 2 mLs (20 mg total) into the muscle once.

## 2014-11-16 ENCOUNTER — Other Ambulatory Visit: Payer: Self-pay | Admitting: Family Medicine

## 2014-11-16 DIAGNOSIS — M542 Cervicalgia: Secondary | ICD-10-CM

## 2014-11-16 MED ORDER — OXYCODONE-ACETAMINOPHEN 5-325 MG PO TABS: 2.0000 | ORAL_TABLET | Freq: Two times a day (BID) | ORAL | Status: DC | PRN

## 2014-11-16 NOTE — Telephone Encounter (Signed)
On last visit patient forgot to request a refill on her pain medication hydrocodone.

## 2014-11-16 NOTE — Telephone Encounter (Signed)
Refill request was sent to Dr. Ashany Sundaram for approval and submission.  

## 2014-12-10 ENCOUNTER — Other Ambulatory Visit: Payer: Self-pay | Admitting: Family Medicine

## 2014-12-10 DIAGNOSIS — R21 Rash and other nonspecific skin eruption: Secondary | ICD-10-CM

## 2014-12-10 DIAGNOSIS — J0100 Acute maxillary sinusitis, unspecified: Secondary | ICD-10-CM

## 2014-12-10 NOTE — Telephone Encounter (Signed)
PT IS NEEDING HER MEDICATION FOR HER FACE DOXICLYCINE. PHARM IS [facility]

## 2014-12-11 ENCOUNTER — Other Ambulatory Visit: Payer: Self-pay | Admitting: Family Medicine

## 2014-12-11 DIAGNOSIS — R21 Rash and other nonspecific skin eruption: Secondary | ICD-10-CM

## 2014-12-11 DIAGNOSIS — J0100 Acute maxillary sinusitis, unspecified: Secondary | ICD-10-CM

## 2014-12-11 MED ORDER — DOXYCYCLINE HYCLATE 100 MG PO CAPS: 100.0000 mg | ORAL_CAPSULE | Freq: Two times a day (BID) | ORAL | Status: DC

## 2014-12-11 NOTE — Telephone Encounter (Signed)
Refill request was sent to Dr. Ashany Sundaram for approval and submission.  

## 2014-12-11 NOTE — Telephone Encounter (Signed)
Please contact Donna Haas and confirm if she has an appointment with the Rheumatologist yet? I have referred her many times and she has not made her own efforts to secure an appointment. I will not continue to manage her skin lesions with Doxycycline or refill her Plaquenil until a Rheumatologist confirms this is appropriate treatment for her Lupus.

## 2014-12-11 NOTE — Telephone Encounter (Signed)
Spoke with patient and she is requesting a return call. I informed the patient that a week supply of the medication would be called in but she is still requesting a call.

## 2014-12-11 NOTE — Telephone Encounter (Signed)
Tried to contact this patient but there was no answer.  A message was left for this patient to give [place] a call back when she got the chance.

## 2014-12-11 NOTE — Telephone Encounter (Signed)
Tried to contact this patient to inform her that Dr. Nadine Counts will only give her a week supply of the Doxy 100mg  which will carry over to the appt she has with Dr. Percell Boston on 12/21/14 at 8:30am, but there was no answer. A message was left for her to give [place] a call when she got the chance.

## 2015-01-03 DIAGNOSIS — O0991 Supervision of high risk pregnancy, unspecified, first trimester: Secondary | ICD-10-CM | POA: Diagnosis not present

## 2015-01-03 DIAGNOSIS — Z331 Pregnant state, incidental: Secondary | ICD-10-CM | POA: Diagnosis not present

## 2015-01-03 DIAGNOSIS — Z124 Encounter for screening for malignant neoplasm of cervix: Secondary | ICD-10-CM | POA: Diagnosis not present

## 2015-01-03 DIAGNOSIS — O2620 Pregnancy care for patient with recurrent pregnancy loss, unspecified trimester: Secondary | ICD-10-CM | POA: Diagnosis not present

## 2015-01-05 ENCOUNTER — Other Ambulatory Visit
Admission: RE | Admit: 2015-01-05 | Discharge: 2015-01-05 | Disposition: A | Discharge status: Home / Self Care | Payer: Medicare Other | Source: Ambulatory Visit | Attending: Family Medicine | Admitting: Family Medicine

## 2015-01-05 DIAGNOSIS — Z331 Pregnant state, incidental: Secondary | ICD-10-CM | POA: Diagnosis not present

## 2015-01-05 LAB — HCG, QUANTITATIVE, PREGNANCY: hCG, Beta Chain, Quant, S: 3330 m[IU]/mL — ABNORMAL HIGH

## 2015-01-10 DIAGNOSIS — O0991 Supervision of high risk pregnancy, unspecified, first trimester: Secondary | ICD-10-CM | POA: Diagnosis not present

## 2015-01-10 DIAGNOSIS — Z8759 Personal history of other complications of pregnancy, childbirth and the puerperium: Secondary | ICD-10-CM | POA: Diagnosis not present

## 2015-01-10 DIAGNOSIS — Z3A01 Less than 8 weeks gestation of pregnancy: Secondary | ICD-10-CM | POA: Diagnosis not present

## 2015-01-10 DIAGNOSIS — Z36 Encounter for antenatal screening of mother: Secondary | ICD-10-CM | POA: Diagnosis not present

## 2015-01-10 DIAGNOSIS — O09529 Supervision of elderly multigravida, unspecified trimester: Secondary | ICD-10-CM | POA: Diagnosis not present

## 2015-01-21 ENCOUNTER — Ambulatory Visit
Admission: RE | Admit: 2015-01-21 | Discharge: 2015-01-21 | Disposition: A | Discharge status: Home / Self Care | Payer: Medicare Other | Source: Ambulatory Visit | Attending: Obstetrics and Gynecology | Admitting: Obstetrics and Gynecology

## 2015-01-21 ENCOUNTER — Other Ambulatory Visit: Payer: Self-pay | Admitting: Obstetrics and Gynecology

## 2015-01-21 DIAGNOSIS — O09529 Supervision of elderly multigravida, unspecified trimester: Secondary | ICD-10-CM | POA: Diagnosis not present

## 2015-01-21 DIAGNOSIS — M329 Systemic lupus erythematosus, unspecified: Secondary | ICD-10-CM | POA: Diagnosis not present

## 2015-01-21 DIAGNOSIS — Z3481 Encounter for supervision of other normal pregnancy, first trimester: Secondary | ICD-10-CM | POA: Diagnosis not present

## 2015-01-21 DIAGNOSIS — O09621 Supervision of young multigravida, first trimester: Secondary | ICD-10-CM | POA: Diagnosis not present

## 2015-01-21 DIAGNOSIS — Z36 Encounter for antenatal screening of mother: Secondary | ICD-10-CM | POA: Insufficient documentation

## 2015-01-21 DIAGNOSIS — Z3A01 Less than 8 weeks gestation of pregnancy: Secondary | ICD-10-CM | POA: Diagnosis not present

## 2015-01-21 DIAGNOSIS — O3680X0 Pregnancy with inconclusive fetal viability, not applicable or unspecified: Secondary | ICD-10-CM

## 2015-01-22 ENCOUNTER — Encounter: Payer: Self-pay | Admitting: Internal Medicine

## 2015-02-04 ENCOUNTER — Ambulatory Visit
Admission: RE | Admit: 2015-02-04 | Discharge: 2015-02-04 | Disposition: A | Discharge status: Home / Self Care | Payer: Medicare Other | Source: Ambulatory Visit | Attending: Obstetrics and Gynecology | Admitting: Obstetrics and Gynecology

## 2015-02-04 VITALS — BP 126/81 | HR 99 | Temp 98.7°F | Wt 180.0 lb

## 2015-02-04 DIAGNOSIS — Z87898 Personal history of other specified conditions: Secondary | ICD-10-CM | POA: Diagnosis not present

## 2015-02-04 DIAGNOSIS — E669 Obesity, unspecified: Secondary | ICD-10-CM

## 2015-02-04 DIAGNOSIS — O99211 Obesity complicating pregnancy, first trimester: Secondary | ICD-10-CM

## 2015-02-04 DIAGNOSIS — O9989 Other specified diseases and conditions complicating pregnancy, childbirth and the puerperium: Secondary | ICD-10-CM | POA: Diagnosis not present

## 2015-02-04 DIAGNOSIS — O09521 Supervision of elderly multigravida, first trimester: Secondary | ICD-10-CM | POA: Diagnosis not present

## 2015-02-04 DIAGNOSIS — M329 Systemic lupus erythematosus, unspecified: Secondary | ICD-10-CM

## 2015-02-04 HISTORY — DX: Migraine, unspecified, not intractable, without status migrainosus: G43.909

## 2015-02-04 NOTE — Progress Notes (Signed)
[facility] Consultation   Chief Complaint: Advice regarding history of Lupus, preeclsmpsia and advanced maternal age  HPI: [name] is a 39 y.o. [id number] at [redacted]w[redacted]d by [redacted]w[redacted]d [place] on 01/21/2015 who presents in consultation from [facility] for pregnancy recommendations regarding her history of lupus and preeclampsia.  Past Medical History: Patient  has a past medical history of GERD (gastroesophageal reflux disease); Endometriosis; Lupus ([place]); Back pain; Plantar fasciitis; Thyroid cyst; Lumbar herniated disc; Anxiety; Nephrolithiasis; Pancreatitis; Obesity; and Migraines.  Past Surgical History: She  has past surgical history that includes laparoscopy; Thyroid cyst excision; Appendectomy; Cholecystectomy; and Cesarean section.  Obstetric History:  OB History    Gravida Para Term Preterm AB TAB SAB Ectopic Multiple Living   7 1 1  5  3 2  1     G1 miscarriage G2 c/s at 38 weeks due to intolerance of labor, preeclampsia and FGR, live female weighing 5#2oz. G3-6 miscarrige and 2 were reported to be ectopic (spontaneously resolved?)  Gynecologic History:  Patient's last menstrual period was 11/30/2014. She reports irregular periods. Hx of abnormal pap smears: yes Last pap smear was abnormal: HPV was positive   Medications: Klonopin, Nexium, ambien, Claritin, MVI (d/ced xanax and plaquenil about 5 weeks ago) Allergies: Patient is allergic to prednisone; prochlorperazine edisylate; hydrocodone-acetaminophen; and morphine and related.  Social History: Patient  reports that she has never smoked. She has never used smokeless tobacco. She reports that she does not drink alcohol or use illicit drugs. She is currently separated from her partner.  She lives with her daughter.  She is considered disabled due to her lupus and is not currently working. Family History: family history includes Allergies in her mother and sister; Asthma in her mother; Depression in her mother; Diabetes  in her sister; Heart disease in an other family member; Hyperlipidemia in an other family member; Hypertension in an other family member; Rheum arthritis in her maternal grandmother. Daughter with heart murmur (PDA) - being followed but no intervention Review of Systems A full 12 point review of systems was negative or as noted in the History of Present Illness.  Physical Exam: BP 126/81 mmHg  Pulse 99  Temp(Src) 98.7 F (37.1 C)  Wt 180 lb (81.647 kg)  LMP 11/30/2014  Asessement: 1. Elderly multigravida with antepartum condition or complication, first trimester   2.  Lupus - diagnosed in 2002.  Had been on Plaquenil for several years without flares but d/ced it about 5 weeks agoe. 3.  Elevated BMI  4.  History of FGR and preeclampsia  Plan: 1.  AMA - offered genetic counseling and first trimester [place] which I have taken the liberty of scheduling in about 3 weeks 2.  Lupus   - Although Plaquenil is often used for lupus management during pregnancy and is likely to be safer than lupus flares, the patient discontinued this medication.  She hasn't seen a Rheumatologist since 2013.  She is amenable to reestablishing care and a referral was made to see Dr. Sedalia Muta at [facility].  We discussed that should the recommendation be that she restart the Plaquenil, this would be considered safe from an obstetric point of view.  - SS-A and SS-B labs were negative as were Lupus anticoagulant and Anticardiolipin Antibody.  Recommend starting baby ASA after the first trimester  - Baseline CMP and p/c ratio were done and reviewed (normal range) 3.  Elevated BMI  - baseline labs performed, including glucola which were reviewed and are within normal  range; repeat glucola at 28 weeks  - start baby ASA after the first trimester  - add additional 1 mg folic acid  - limit weight gain to 10-15#  - consider serial [place] for growth after 28 weeks 4.  History of FGR and preeclampsia  - baseline labs were reviewed (see  above)  - baby ASA after the first trimester (see above)  - serial [place] for growth after 28 weeks (see above) 5.  Anxiety  - sees a Therapist every 6 months or as needed  - d/ed xanax but still takes Klonopin - feels her mood is stable  - will need to inform peds at delivery; may cause some neonatal withdrawal  - may be at increased risk for post partum depression - monitor   Total time spent with the patient was 30 minutes with greater than 50% spent in counseling and coordination of care. We appreciate this interesting consult and will be happy to be involved in the ongoing care of [name] in anyway her obstetricians desire.  Wynona Neat, MD [facility]

## 2015-02-12 ENCOUNTER — Ambulatory Visit (INDEPENDENT_AMBULATORY_CARE_PROVIDER_SITE_OTHER): Payer: Medicare Other

## 2015-02-12 DIAGNOSIS — Z23 Encounter for immunization: Secondary | ICD-10-CM

## 2015-02-14 ENCOUNTER — Ambulatory Visit (INDEPENDENT_AMBULATORY_CARE_PROVIDER_SITE_OTHER): Payer: Medicare Other | Admitting: Family Medicine

## 2015-02-14 ENCOUNTER — Encounter: Payer: Self-pay | Admitting: Family Medicine

## 2015-02-14 VITALS — BP 122/78 | HR 113 | Temp 99.1°F | Resp 19 | Ht 60.0 in | Wt 179.5 lb

## 2015-02-14 DIAGNOSIS — R21 Rash and other nonspecific skin eruption: Secondary | ICD-10-CM

## 2015-02-14 DIAGNOSIS — B379 Candidiasis, unspecified: Secondary | ICD-10-CM | POA: Diagnosis not present

## 2015-02-14 DIAGNOSIS — T3695XA Adverse effect of unspecified systemic antibiotic, initial encounter: Secondary | ICD-10-CM | POA: Insufficient documentation

## 2015-02-14 MED ORDER — CLINDAMYCIN HCL 150 MG PO CAPS: 150.0000 mg | ORAL_CAPSULE | Freq: Three times a day (TID) | ORAL | Status: DC

## 2015-02-14 MED ORDER — MICONAZOLE 1 1200 & 2 MG & % VA KIT: 1200.0000 mg | PACK | Freq: Once | VAGINAL | Status: DC | PRN

## 2015-02-14 NOTE — Progress Notes (Signed)
Name: KENZA MUNAR   MRN: [id number]    DOB: 27-Aug-1975   Date:02/14/2015       Progress Note  Subjective  Chief Complaint  Chief Complaint  Patient presents with  . Acute Visit    Poss MRSA    HPI  Alithea Arnoldo Morale is a 39 year old female in first trimester of of her 2nd pregnancy doing well except she has a lesion on her left cheek near her nose that is concerned for as her father had MRSA recently. The lesion onset was yesterday, red, slightly tender, not spreading. No fevers, headaches.    Past Medical History  Diagnosis Date  . GERD (gastroesophageal reflux disease)   . Endometriosis   . Lupus ([place])   . Back pain   . Plantar fasciitis   . Thyroid cyst   . Lumbar herniated disc   . Anxiety   . Nephrolithiasis   . Pancreatitis   . Obesity   . Migraines     Patient Active Problem List   Diagnosis Date Noted  . Antibiotic-induced yeast infection 02/14/2015  . Rash and nonspecific skin eruption 11/14/2014  . Headache 09/08/2012  . Left arm pain 09/08/2012  . Neck pain 09/08/2012  . BIPOLAR DISORDER UNSPECIFIED 05/24/2009  . MOLE 02/22/2009  . INSOMNIA 09/21/2008  . FOOT SPRAIN 09/21/2008  . PANCREATITIS, HX OF 04/17/2008  . NEPHROLITHIASIS, HX OF 04/17/2008  . ANXIETY 02/16/2008  . THYROID NODULE 01/12/2008  . PARESTHESIA 12/14/2007  . FOLLICULITIS 83/72/9021  . OBSTRUCTIVE SLEEP APNEA 10/03/2007  . HYPERSOMNIA 07/13/2007  . THYROID CYST 05/30/2007  . GERD 05/30/2007  . ENDOMETRIOSIS OF OTHER SPECIFIED SITES 05/30/2007  . Lupus erythematosus 05/30/2007  . HERNIATED LUMBAR [place] 05/30/2007  . BACK PAIN 05/30/2007  . PLANTAR FASCIITIS, BILATERAL 05/30/2007  . SNORING, HX OF 05/30/2007    Social History  Substance Use Topics  . Smoking status: Never Smoker   . Smokeless tobacco: Never Used  . Alcohol Use: No     Comment: discontinued use during pregnancy     Current outpatient prescriptions:  .  albuterol (VENTOLIN HFA) 108 (90 BASE) MCG/ACT inhaler,  Inhale 2 puffs into the lungs every 4 (four) hours as needed for wheezing. , Disp: , Rfl:  .  clonazePAM (KLONOPIN) 1 MG tablet, Take 1 mg by mouth 2 (two) times daily as needed. Two tablets at bedtime, Disp: , Rfl:  .  esomeprazole (NEXIUM) 40 MG capsule, Take 20 mg by mouth daily. , Disp: , Rfl:  .  loratadine (CLARITIN) 10 MG tablet, Take 10 mg by mouth daily., Disp: , Rfl:  .  Multiple Vitamins-Minerals (MULTIVITAMIN PO), Take 1 tablet by mouth daily., Disp: , Rfl:  .  zolpidem (AMBIEN) 10 MG tablet, Take 10 mg by mouth at bedtime as needed for sleep. , Disp: , Rfl:  .  clindamycin (CLEOCIN) 150 MG capsule, Take 1 capsule (150 mg total) by mouth 3 (three) times daily., Disp: 30 capsule, Rfl: 0 .  Miconazole Nitrate (MICONAZOLE 1) 1200 & 2 MG & % KIT, Place 1,200 mg vaginally once as needed., Disp: 1 kit, Rfl: 1  Current facility-administered medications:  .  dexamethasone (DECADRON) injection 30 mg, 30 mg, Intramuscular, Once, Bobetta Lime, MD  Allergies  Allergen Reactions  . Prednisone Anaphylaxis  . Prochlorperazine Edisylate Anaphylaxis  . Compazine  [Prochlorperazine Edisylate] Other (See Comments)  . Hydrocodone-Acetaminophen Nausea Only  . Morphine And Related Nausea Only and Other (See Comments)    Mood swings, headache  Review of Systems  Positive for rash as mentioned in HPI, otherwise all systems reviewed and are negative.  Objective  BP 122/78 mmHg  Pulse 113  Temp(Src) 99.1 F (37.3 C) (Oral)  Resp 19  Ht 5' (1.524 m)  Wt 179 lb 8 oz (81.421 kg)  BMI 35.06 kg/m2  SpO2 98%  LMP 11/30/2014  Body mass index is 35.06 kg/(m^2).   Physical Exam  Constitutional: Patient appears obese and well-nourished. In no distress.  HEENT:  - Head: Normocephalic and atraumatic.  - Ears: Bilateral TMs gray, no erythema or effusion - Nose: Nasal mucosa moist - Mouth/Throat: Oropharynx is clear and moist. No tonsillar hypertrophy or erythema. No post nasal drainage.   - Eyes: Conjunctivae clear, EOM movements normal. PERRLA. No scleral icterus.  Neck: Normal range of motion. Neck supple. No JVD present. No thyromegaly present.  Cardiovascular: Normal rate, regular rhythm and normal heart sounds.  No murmur heard.  Pulmonary/Chest: Effort normal and breath sounds normal. No respiratory distress. Skin: Skin is warm and dry. Just lateral to the left side of her nose there is a 0.5cm slightly raised red lesion with central pus formation. Well demarcated border. Psychiatric: Patient has a normal mood and affect. Behavior is normal in office today. Judgment and thought content normal in office today.    Assessment & Plan  1. Rash and nonspecific skin eruption Discussed treatment options for MRSA and decided on clindamycin. The patient has been counseled on the proper use, side effects and potential interactions of the new medication. Patient encouraged to review the side effects and safety profile pamphlet provided with the prescription from the pharmacy as well as request counseling from the pharmacy team as needed.    - clindamycin (CLEOCIN) 150 MG capsule; Take 1 capsule (150 mg total) by mouth 3 (three) times daily.  Dispense: 30 capsule; Refill: 0  2. Antibiotic-induced yeast infection She tends to get vaginal yeast infections after antibiotic use, avoid Fluconazole.   - Miconazole Nitrate (MICONAZOLE 1) 1200 & 2 MG & % KIT; Place 1,200 mg vaginally once as needed.  Dispense: 1 kit; Refill: 1

## 2015-02-20 DIAGNOSIS — O09529 Supervision of elderly multigravida, unspecified trimester: Secondary | ICD-10-CM | POA: Diagnosis not present

## 2015-02-20 DIAGNOSIS — Z3481 Encounter for supervision of other normal pregnancy, first trimester: Secondary | ICD-10-CM | POA: Diagnosis not present

## 2015-02-20 DIAGNOSIS — O09621 Supervision of young multigravida, first trimester: Secondary | ICD-10-CM | POA: Diagnosis not present

## 2015-02-20 DIAGNOSIS — Z3A11 11 weeks gestation of pregnancy: Secondary | ICD-10-CM | POA: Diagnosis not present

## 2015-02-20 DIAGNOSIS — M329 Systemic lupus erythematosus, unspecified: Secondary | ICD-10-CM | POA: Diagnosis not present

## 2015-02-25 ENCOUNTER — Ambulatory Visit
Admission: RE | Admit: 2015-02-25 | Discharge: 2015-02-25 | Disposition: A | Discharge status: Home / Self Care | Payer: Medicare Other | Source: Ambulatory Visit | Attending: Obstetrics and Gynecology | Admitting: Obstetrics and Gynecology

## 2015-02-25 ENCOUNTER — Ambulatory Visit (HOSPITAL_BASED_OUTPATIENT_CLINIC_OR_DEPARTMENT_OTHER)
Admission: RE | Admit: 2015-02-25 | Discharge: 2015-02-25 | Disposition: A | Discharge status: Home / Self Care | Payer: Medicare Other | Source: Ambulatory Visit | Attending: Obstetrics and Gynecology | Admitting: Obstetrics and Gynecology

## 2015-02-25 VITALS — BP 117/77 | HR 103 | Temp 97.8°F | Wt 180.0 lb

## 2015-02-25 DIAGNOSIS — O09521 Supervision of elderly multigravida, first trimester: Secondary | ICD-10-CM | POA: Diagnosis present

## 2015-02-25 DIAGNOSIS — Z3A12 12 weeks gestation of pregnancy: Secondary | ICD-10-CM | POA: Diagnosis not present

## 2015-02-25 DIAGNOSIS — Z36 Encounter for antenatal screening of mother: Secondary | ICD-10-CM

## 2015-02-25 DIAGNOSIS — O2621 Pregnancy care for patient with recurrent pregnancy loss, first trimester: Secondary | ICD-10-CM | POA: Diagnosis not present

## 2015-02-25 DIAGNOSIS — Z369 Encounter for antenatal screening, unspecified: Secondary | ICD-10-CM | POA: Insufficient documentation

## 2015-02-25 DIAGNOSIS — O2620 Pregnancy care for patient with recurrent pregnancy loss, unspecified trimester: Secondary | ICD-10-CM | POA: Insufficient documentation

## 2015-02-25 NOTE — Progress Notes (Signed)
Donna Wells, MS, CGC performed an integral service incident to the physician's initial service.  I was physically present in the clinical area and was immediately available to render assistance.   [name] C [name]  

## 2015-02-25 NOTE — Progress Notes (Signed)
Donna Haas Length of Consultation: 50 minute consultation  Donna Haas was referred to [facility] for genetic counseling because of advanced maternal age and a history of recurrent pregnancy loss.  The patient will be 39 years old at the time of delivery.  This note summarizes the information we discussed.    We explained that the chance of a chromosome abnormality increases with maternal age.  Chromosomes and examples of chromosome problems were reviewed.  Humans typically have 46 chromosomes in each cell, with half passed through each sperm and egg.  Any change in the number or structure of chromosomes can increase the risk of problems in the physical and mental development of a pregnancy.   Based upon age of the patient, the chance of any chromosome abnormality was 1 in 6. The chance of Down syndrome, the most common chromosome problem associated with maternal age, was 1 in 34.  The risk of chromosome problems is in addition to the 3% general population risk for birth defects and mental retardation.  The greatest chance, of course, is that the baby would be born in good health.  We discussed the following prenatal screening and testing options for this pregnancy:  First trimester screening, which can include nuchal translucency ultrasound screen and/or first trimester maternal serum marker screening.  The nuchal translucency has approximately an 80% detection rate for Down syndrome and can be positive for other chromosome abnormalities as well as heart defects.  When combined with a maternal serum marker screening, the detection rate is up to 90% for Down syndrome and up to 97% for trisomy 18.     The chorionic villus sampling procedure is available for first trimester chromosome analysis.  This involves the withdrawal of a small amount of chorionic villi (tissue from the developing placenta).  Risk of pregnancy loss is estimated to be approximately 1 in 200 to 1 in 100  (0.5 to 1%).  There is approximately a 1% (1 in 100) chance that the CVS chromosome results will be unclear.  Chorionic villi cannot be tested for neural tube defects.     Maternal serum marker screening, a blood test that measures pregnancy proteins, can provide risk assessments for Down syndrome, trisomy 18, and open neural tube defects (spina bifida, anencephaly). Because it does not directly examine the fetus, it cannot positively diagnose or rule out these problems.  Targeted ultrasound uses high frequency sound waves to create an image of the developing fetus.  An ultrasound is often recommended as a routine means of evaluating the pregnancy.  It is also used to screen for fetal anatomy problems (for example, a heart defect) that might be suggestive of a chromosomal or other abnormality.   Amniocentesis involves the removal of a small amount of amniotic fluid from the sac surrounding the fetus with the use of a thin needle inserted through the maternal abdomen and uterus.  Ultrasound guidance is used throughout the procedure.  Fetal cells from amniotic fluid are directly evaluated and > 99.5% of chromosome problems and > 98% of open neural tube defects can be detected. This procedure is generally performed after the 15th week of pregnancy.  The main risks to this procedure include complications leading to miscarriage in less than 1 in 200 cases (0.5%).  We also reviewed the availability of cell free fetal DNA testing from maternal blood to determine whether or not the baby may have either Down syndrome, trisomy 68, or trisomy 60.  This test utilizes a maternal  blood sample and DNA sequencing technology to isolate circulating cell free fetal DNA from maternal plasma.  The fetal DNA can then be analyzed for DNA sequences that are derived from the three most common chromosomes involved in aneuploidy, chromosomes 13, 18, and 21.  If the overall amount of DNA is greater than the expected level for any of  these chromosomes, aneuploidy is suspected.  While we do not consider it a replacement for invasive testing and karyotype analysis, a negative result from this testing would be reassuring, though not a guarantee of a normal chromosome complement for the baby.  An abnormal result is certainly suggestive of an abnormal chromosome complement, though we would still recommend CVS or amniocentesis to confirm any findings from this testing.  This testing was drawn last week at [facility] and is still pending.  Cystic Fibrosis screening was also discussed with the patient. Cystic fibrosis (CF) is one of the most common genetic conditions in persons of Caucasian ancestry.  This condition occurs in approximately 1 in 2,500 Caucasian persons and results in thickened secretions in the lungs, digestive, and reproductive systems.  For a baby to be at risk for having CF, both of the parents must be carriers for this condition.  Approximately 1 in 37 Caucasian persons is a carrier for CF.  Current carrier testing looks for the most common mutations in the gene for CF and can detect approximately 90% of carriers in the Caucasian population.  This means that the carrier screening can greatly reduce, but cannot eliminate, the chance for an individual to have a child with CF.  If an individual is found to be a carrier for CF, then carrier testing would be available for the partner. As part of [place] newborn screening profile, all babies born in the state of [place] will have a two-tier screening process.  Specimens are first tested to determine the concentration of immunoreactive trypsinogen (IRT).  The top 5% of specimens with the highest IRT values then undergo DNA testing using a panel of over 40 common CF mutations.   We obtained a detailed family history and pregnancy history.  This is the seventh pregnancy for Donna Haas, the first with her current partner.  He had no other children.  In her first marriage,  she had three miscarriages between 5 and [redacted] weeks gestation, with the last being after an exposure to Fifths disease (though she had no symptoms).  They have one daughter together, now 20 years old.  She was the product of a twin pregnancy in which there was loss of one twin early in the pregnancy.  Their daughter is in good health, with normal growth and development with the exception of seizures and ADHD.  Seizures can occur for a variety of reasons, though the specific cause is often not known.  In this history, there are no features suggestive of a genetic syndrome as the cause.  Therefore, we would expect the recurrence chance to be low in this pregnancy.  Donna Haas also had two ectopic pregnancies which "resolved themselves" with her second husband.  She reported no complications or exposures in this pregnancy that are expected to increase the risk for birth defects.  The patient herself has been diagnosed with Lupus, anxiety and GERD.  She met with Dr. Zack Seal for an MFM consultation earlier in this pregnancy.  Please see that note for recommendations regarding maternal health.  In the family, the patient stated that she has mild hearing impairment and  that her sister has a daughter with unilateral hearing loss.   Donna Haas did not comment if the hearing loss is sensorineural or conductive.  These two types may carry different recurrence risks.  We explained that there is a multitude of genetic and non-genetic causes for hearing loss, and that in the absence of more specific information, it is difficult to determine the recurrence risk in the family.  We are happy to review medical records on any genetic testing that may be been performed in the family.  We recommend making sure the pediatrician is aware of this history and following up on newborn screening hearing evaluations as recommended after birth.  There are also several family members with mental health conditions including anxiety in Donna Haas  and bipolar in her mother, first cousin and possibly her grandfather.  Mental health conditions are thought to have strong inherited components in many families, but at this time the specific genetic factors are not well understood.  It would be important for the patient to be mindful of this history for her children.  Lastly, Donna Haas' mother  Also experienced mutliple miscarriages.  She recalls having there losses, all in the second trimester.  She was told that one had a problem where the chromosomes "did not split" properly.  This may mean that there was an extra or missing whole chromosome or that there may have been another rearrangement of the chromosomes.  The remainder of the family history was reported to be unremarkable for birth defects, mental retardation, recurrent pregnancy loss or known chromosome abnormalities.  There can be many different causes for pregnancy loss.  It is known that approximately 15% of recognized pregnancies will end in miscarriage, often with no known cause.  When a person has experienced more than three losses, we typically begin to search for specific factors causing the miscarriages. If a couple has experienced less than three losses, it is less likely that there will be a specific identifiable cause.  During our visit, we discussed several of these causes, including chromosome rearrangements, clotting factors, antibodies, and structural differences in the uterus.  Given the report of a possible chromosome difference in her mother's pregnancy loss, we spoke more about chromosome conditions.  Chromosomes are the structures inside each cell of our bodies that have all the instructions for growth and development.  There are usually 23 pairs of chromosomes in each cell, with one member of each pair coming from each parent.  This means that we all have two copies of all of our genetic instructions.  Sometimes the instructions are all present, but are arranged differently.  A  rearrangement of the chromosomes is called a translocation and is caused when one or more chromosomes break and reattach in a different location.  As long as an individual still has two copies of all the genetic instructions, this is does not result in medical problems.  However, when a person with a translocation makes egg or sperm cells, it is difficult for the chromosomes to divide normally.  This can result in too much or too little genetic information in the fetus, which can cause miscarriage, birth defects, or developmental differences.  We offered to test the chromosomes of Donna Haas through a blood test.  After consideration of the options, Donna Haas elected to proceed with chromosome analysis on herself today.  Results should be available within 2 weeks.  Informaseq testing is pending at [facility].  She declined CF carrier screening.  An ultrasound  was performed at the time of the visit.  The gestational age was consistent with 12 weeks.  Fetal anatomy could not be assessed due to early gestational age.  Please refer to the ultrasound report for details of that study.  Donna Haas was encouraged to call with questions or concerns.  We can be contacted at [phone].   Wilburt Finlay, MS, CGC

## 2015-03-08 LAB — MISC LABCORP TEST (SEND OUT): Labcorp test code: 511035

## 2015-03-11 ENCOUNTER — Telehealth: Payer: Self-pay | Admitting: Obstetrics and Gynecology

## 2015-03-11 NOTE — Telephone Encounter (Signed)
[name] elected to undergo maternal chromosome analysis at the time of her visit with [place] on 02/25/2015.  We offered chromosome analysis due to her history of miscarriages as well as her mother's report of a pregnancy loss with a "chromosome abnormality".  We have informed the patient by phone that her karyotype results are 46,XX, which indicates no changes in the number or structure of the chromosomes within the resolution of the study.  There is no evidence of a chromosome translocation which could have contributed to her losses.  If more information is desired, we are happy to review medical records on her mother's pregnancy loss with the chromosome difference.  We may be reached at [phone].  Wilburt Finlay, MS, CGC

## 2015-03-12 ENCOUNTER — Ambulatory Visit: Payer: Medicare Other | Admitting: Family Medicine

## 2015-04-11 ENCOUNTER — Ambulatory Visit
Admission: RE | Admit: 2015-04-11 | Discharge: 2015-04-11 | Disposition: A | Discharge status: Home / Self Care | Payer: Medicare Other | Source: Ambulatory Visit | Attending: Obstetrics and Gynecology | Admitting: Obstetrics and Gynecology

## 2015-04-11 ENCOUNTER — Other Ambulatory Visit: Payer: Self-pay | Admitting: Obstetrics and Gynecology

## 2015-04-11 ENCOUNTER — Other Ambulatory Visit: Payer: Self-pay

## 2015-04-11 DIAGNOSIS — O09522 Supervision of elderly multigravida, second trimester: Secondary | ICD-10-CM

## 2015-04-13 LAB — AFP, SERUM, OPEN SPINA BIFIDA
AFP MoM: 0.92
AFP Value: 38.1 ng/mL
Gest. Age on Collection Date: 18.9 wk
Maternal Age At EDD: 37.8 a
OSBR Risk 1 IN: 10000
PDF: 0
Test Results:: NEGATIVE
Weight: 188 [lb_av]

## 2015-04-18 ENCOUNTER — Telehealth: Payer: Self-pay | Admitting: Obstetrics and Gynecology

## 2015-04-18 NOTE — Telephone Encounter (Signed)
Ms. Arnoldo Morale  elected to undergo maternal serum screening for AFP only at her visit on 04/11/2015.  Results of this testing, performed at [place], are within normal limits.  The chance for open spina bifida based upon these results is 1 in 10,000.  This screening test detects approximately 80% of open neural tube defects.    The patient was informed of these results by phone.  Wilburt Finlay, MS, CGC

## 2015-05-29 ENCOUNTER — Encounter (HOSPITAL_COMMUNITY): Payer: Self-pay

## 2015-06-03 ENCOUNTER — Observation Stay
Admission: EM | Admit: 2015-06-03 | Discharge: 2015-06-03 | Disposition: A | Discharge status: Home / Self Care | Payer: Medicare Other | Attending: Certified Nurse Midwife | Admitting: Certified Nurse Midwife

## 2015-06-03 DIAGNOSIS — R1012 Left upper quadrant pain: Secondary | ICD-10-CM | POA: Diagnosis not present

## 2015-06-03 DIAGNOSIS — F419 Anxiety disorder, unspecified: Secondary | ICD-10-CM | POA: Insufficient documentation

## 2015-06-03 DIAGNOSIS — O34219 Maternal care for unspecified type scar from previous cesarean delivery: Secondary | ICD-10-CM | POA: Insufficient documentation

## 2015-06-03 DIAGNOSIS — M329 Systemic lupus erythematosus, unspecified: Secondary | ICD-10-CM | POA: Insufficient documentation

## 2015-06-03 DIAGNOSIS — O99342 Other mental disorders complicating pregnancy, second trimester: Secondary | ICD-10-CM | POA: Diagnosis not present

## 2015-06-03 DIAGNOSIS — R072 Precordial pain: Secondary | ICD-10-CM | POA: Diagnosis not present

## 2015-06-03 DIAGNOSIS — O99612 Diseases of the digestive system complicating pregnancy, second trimester: Secondary | ICD-10-CM | POA: Insufficient documentation

## 2015-06-03 DIAGNOSIS — O26892 Other specified pregnancy related conditions, second trimester: Secondary | ICD-10-CM | POA: Diagnosis present

## 2015-06-03 DIAGNOSIS — R079 Chest pain, unspecified: Secondary | ICD-10-CM | POA: Diagnosis present

## 2015-06-03 DIAGNOSIS — K2270 Barrett's esophagus without dysplasia: Secondary | ICD-10-CM | POA: Insufficient documentation

## 2015-06-03 DIAGNOSIS — O09522 Supervision of elderly multigravida, second trimester: Secondary | ICD-10-CM | POA: Insufficient documentation

## 2015-06-03 DIAGNOSIS — Z3A26 26 weeks gestation of pregnancy: Secondary | ICD-10-CM | POA: Diagnosis not present

## 2015-06-03 DIAGNOSIS — R0602 Shortness of breath: Secondary | ICD-10-CM

## 2015-06-03 LAB — COMPREHENSIVE METABOLIC PANEL WITH GFR
ALT: 11 U/L — ABNORMAL LOW (ref 14–54)
AST: 16 U/L (ref 15–41)
Albumin: 3 g/dL — ABNORMAL LOW (ref 3.5–5.0)
Alkaline Phosphatase: 78 U/L (ref 38–126)
Anion gap: 9 (ref 5–15)
BUN: 6 mg/dL (ref 6–20)
CO2: 20 mmol/L — ABNORMAL LOW (ref 22–32)
Calcium: 8.7 mg/dL — ABNORMAL LOW (ref 8.9–10.3)
Chloride: 108 mmol/L (ref 101–111)
Creatinine, Ser: 0.51 mg/dL (ref 0.44–1.00)
GFR calc Af Amer: >60 mL/min
GFR calc non Af Amer: >60 mL/min
Glucose, Bld: 95 mg/dL (ref 65–99)
Potassium: 3.9 mmol/L (ref 3.5–5.1)
Sodium: 137 mmol/L (ref 135–145)
Total Bilirubin: 0.5 mg/dL (ref 0.3–1.2)
Total Protein: 6.5 g/dL (ref 6.5–8.1)

## 2015-06-03 LAB — CBC
HCT: 31.9 % — ABNORMAL LOW (ref 35.0–47.0)
Hemoglobin: 10.4 g/dL — ABNORMAL LOW (ref 12.0–16.0)
MCH: 28.7 pg (ref 26.0–34.0)
MCHC: 32.6 g/dL (ref 32.0–36.0)
MCV: 88.2 fL (ref 80.0–100.0)
Platelets: 240 10*3/uL (ref 150–440)
RBC: 3.62 MIL/uL — ABNORMAL LOW (ref 3.80–5.20)
RDW: 15.6 % — ABNORMAL HIGH (ref 11.5–14.5)
WBC: 14.9 10*3/uL — ABNORMAL HIGH (ref 3.6–11.0)

## 2015-06-03 LAB — LIPASE, BLOOD: Lipase: 27 U/L (ref 11–51)

## 2015-06-03 NOTE — OB Triage Note (Signed)
[name] here with c/o constant upper abdominal pain, on left side. Also reports periods with SOB and chest pain. Denies chest pain at this time, but does state she is having SOB at this time, these symptoms have been occurring since Thursday of last week.  Reports decreased fetal movement.

## 2015-06-04 NOTE — Final Progress Note (Signed)
Physician Final Progress Note  Patient ID: Donna Haas MRN: [id number] DOB/AGE: April 22, 1975 40 y.o.  Admit date: 06/03/2015 Admitting provider: Malachy Mood, MD/ [facility]. Danise Mina, CNM Discharge date: 06/04/2015   Admission Diagnoses: Left upper abdominal pain, shortness of breath Pregnancy at 26.3 weeks  Discharge Diagnoses:  same  Consults: cardiopulmonary  Significant Findings/ Diagnostic Studies: 40 year old [id number] with EDC=09/06/2015 by LMP=5wk ultrasound presents from the office with numerous complaints including constant LUQ abdominal pain x 1 week and decreased FM today. Over the weekend she reported dull substernal pain to the physician on call, but she is not experiencing that today. She reports intermittent bouts of SOB, that resolve after a few moments of taking some deep breaths. Has felt lightheaded at times with the pregnancy. Denies bleeding. Has had intermittent nausea, no vomiting or diarrhea or fever or chills. Last stool this AM was normal. P[facility]atal care at [facility] has been complicated by AMA (normal Informaseq), lupus, Barrett's esophagus (was going to have surgery on esophagus when she found out she was pregnant), anxiety (takes Klonopin, a previous cesarean section and preeclampsia with that pregnancy. Has had a cholecystectomy   Exam: BP 128/67 mmHg  Pulse 98  Temp(Src) 98.2 F (36.8 C) (Oral)  Resp 16  SpO2 99%  LMP 11/30/2014  General: appears comfortable, no difficulty breathing, no tachypnea Lungs: CTA Heart: RRR without murmur EKG: NSR (to be reviewed by cardiologist) Abdomen: tenderness to palpation in epigastric and LUQ, BS present, obese Ultrasound: breech-transverse presentation FHR: 140s with accelerations to 160 to 170, moderate variability Toco: no contractions seen  Results for orders placed or performed during the hospital encounter of 06/03/15 (from the past 24 hour(s))  Lipase, blood     Status: None   Collection Time: 06/03/15  6:43  PM  Result Value Ref Range   Lipase 27 11 - 51 U/L  CBC     Status: Abnormal   Collection Time: 06/03/15  6:43 PM  Result Value Ref Range   WBC 14.9 (H) 3.6 - 11.0 K/uL   RBC 3.62 (L) 3.80 - 5.20 MIL/uL   Hemoglobin 10.4 (L) 12.0 - 16.0 g/dL   HCT 31.9 (L) 35.0 - 47.0 %   MCV 88.2 80.0 - 100.0 fL   MCH 28.7 26.0 - 34.0 pg   MCHC 32.6 32.0 - 36.0 g/dL   RDW 15.6 (H) 11.5 - 14.5 %   Platelets 240 150 - 440 K/uL  Comprehensive metabolic panel     Status: Abnormal   Collection Time: 06/03/15  6:43 PM  Result Value Ref Range   Sodium 137 135 - 145 mmol/L   Potassium 3.9 3.5 - 5.1 mmol/L   Chloride 108 101 - 111 mmol/L   CO2 20 (L) 22 - 32 mmol/L   Glucose, Bld 95 65 - 99 mg/dL   BUN 6 6 - 20 mg/dL   Creatinine, Ser 0.51 0.44 - 1.00 mg/dL   Calcium 8.7 (L) 8.9 - 10.3 mg/dL   Total Protein 6.5 6.5 - 8.1 g/dL   Albumin 3.0 (L) 3.5 - 5.0 g/dL   AST 16 15 - 41 U/L   ALT 11 (L) 14 - 54 U/L   Alkaline Phosphatase 78 38 - 126 U/L   Total Bilirubin 0.5 0.3 - 1.2 mg/dL   GFR calc non Af Amer >60 >60 mL/min   GFR calc Af Amer >60 >60 mL/min   Anion gap 9 5 - 15  A/P IUP at 26.3 weeks with LUOQ and epigastric  pain  Normal lipase and liver enzymes  With history of Barrett's and reflux-may be GI related pain  Will get GI consult at [facility] as OP SOB/lightheadedness intermittently: may be normal for pregnancy  Pulse O2 is normal and lungs sound clear Anxiety: may exacerbate above complaints    Procedures: NST and EKG  Discharge Condition: stable  Disposition: 01-Home or Self Care  Diet: Regular diet  Discharge Activity: Activity as tolerated     Medication List    ASK your doctor about these medications        aspirin 81 MG chewable tablet  Chew 81 mg by mouth daily.     clonazePAM 1 MG tablet  Commonly known as:  KLONOPIN  Take 1 mg by mouth 2 (two) times daily as needed. Two tablets at bedtime     esomeprazole 40 MG capsule  Commonly known as:  NEXIUM  Take 20 mg by  mouth daily.     loratadine 10 MG tablet  Commonly known as:  CLARITIN  Take 10 mg by mouth daily.     MULTIVITAMIN PO  Take 1 tablet by mouth daily.     VENTOLIN HFA 108 (90 Base) MCG/ACT inhaler  Generic drug:  albuterol  Inhale 2 puffs into the lungs every 4 (four) hours as needed for wheezing.     zolpidem 10 MG tablet  Commonly known as:  AMBIEN  Take 10 mg by mouth at bedtime as needed for sleep.         Total time spent taking care of this patient: 20 minutes  Signed: Rilee Wendling 06/04/2015, 4:19 AM

## 2015-06-05 DIAGNOSIS — K582 Mixed irritable bowel syndrome: Secondary | ICD-10-CM | POA: Diagnosis not present

## 2015-06-05 DIAGNOSIS — R197 Diarrhea, unspecified: Secondary | ICD-10-CM | POA: Diagnosis not present

## 2015-06-05 DIAGNOSIS — M329 Systemic lupus erythematosus, unspecified: Secondary | ICD-10-CM | POA: Diagnosis not present

## 2015-06-13 DIAGNOSIS — Z3A27 27 weeks gestation of pregnancy: Secondary | ICD-10-CM | POA: Diagnosis not present

## 2015-06-18 DIAGNOSIS — O99810 Abnormal glucose complicating pregnancy: Secondary | ICD-10-CM | POA: Diagnosis not present

## 2015-06-21 DIAGNOSIS — Z3A29 29 weeks gestation of pregnancy: Secondary | ICD-10-CM | POA: Diagnosis not present

## 2015-06-21 DIAGNOSIS — Z6837 Body mass index (BMI) 37.0-37.9, adult: Secondary | ICD-10-CM | POA: Diagnosis not present

## 2015-06-21 DIAGNOSIS — O24410 Gestational diabetes mellitus in pregnancy, diet controlled: Secondary | ICD-10-CM | POA: Diagnosis not present

## 2015-06-21 DIAGNOSIS — O09522 Supervision of elderly multigravida, second trimester: Secondary | ICD-10-CM | POA: Diagnosis not present

## 2015-06-21 DIAGNOSIS — O34219 Maternal care for unspecified type scar from previous cesarean delivery: Secondary | ICD-10-CM | POA: Diagnosis not present

## 2015-06-21 DIAGNOSIS — O0993 Supervision of high risk pregnancy, unspecified, third trimester: Secondary | ICD-10-CM | POA: Diagnosis not present

## 2015-06-25 ENCOUNTER — Encounter: Payer: Medicaid Other | Attending: Obstetrics and Gynecology | Admitting: Dietician

## 2015-06-25 VITALS — BP 110/68 | Ht 60.0 in | Wt 204.2 lb

## 2015-06-25 DIAGNOSIS — O24419 Gestational diabetes mellitus in pregnancy, unspecified control: Secondary | ICD-10-CM | POA: Insufficient documentation

## 2015-06-25 NOTE — Progress Notes (Addendum)
Diabetes Self-Management Education  Visit Type: First/Initial  Appt. Start Time: 1300 Appt. End Time: 1430  06/25/2015  Donna Haas, identified by name and date of birth, is a 40 y.o. female with a diagnosis of Diabetes: Gestational Diabetes.   ASSESSMENT  Blood pressure 110/68, height 5' (1.524 m), weight 204 lb 3.2 oz (92.625 kg), last menstrual period 11/30/2014. Body mass index is 39.88 kg/(m^2).  c/o tingling in right hand (worse at night)-carpel tunnel      Diabetes Self-Management Education - 06/25/15 1506    Visit Information   Visit Type First/Initial   Initial Visit   Diabetes Type Gestational Diabetes   Health Coping   How would you rate your overall health? Good   Psychosocial Assessment   Patient Belief/Attitude about Diabetes Motivated to manage diabetes   Self-care barriers None   Patient Concerns Glycemic Control  prevent complications   Special Needs None   Preferred Learning Style Visual   Learning Readiness Ready   What is the last grade level you completed in school? 12 + college   Complications   How often do you check your blood sugar? --  4x/day   Fasting Blood glucose range (mg/dL) --  118, 110, 117, 110, 108   Postprandial Blood glucose range (mg/dL) 70-129;130-179   Have you had a dilated eye exam in the past 12 months? Yes   Have you had a dental exam in the past 12 months? Yes   Are you checking your feet? No   Dietary Intake   Breakfast --  eats 3 meals/day and 3 snacks-eats low carb meals at times   Lunch --  eats 0-1 fried foods and sweets/wk   Dinner --  supper time varies 5-7pm   Beverage(s) --  drinks 8+ glasses of water/day; drinks occasional sugar free drink   Exercise   Exercise Type --  walks 30 min 5x/day   How many days per week to you exercise? 5   How many minutes per day do you exercise? 30   Total minutes per week of exercise 150   Patient Education   Previous Diabetes Education No   Disease state  Explored  patient's options for treatment of their diabetes;Factors that contribute to the development of diabetes  pathophysiology of GDM and treatment options-importance of diet and exercise  to promote BG control; discussed increased risk for developing type 2 diabetes later in life   Nutrition management  Role of diet in the treatment of diabetes and the relationship between the three main macronutrients and blood glucose level;Food label reading, portion sizes and measuring food.;Carbohydrate counting   Physical activity and exercise  --  role of exercise for BG control-encouraged to continue regular walking as permitted by MD   Medications Reviewed patients medication for diabetes, action, purpose, timing of dose and side effects.  on Glyburide 2.5 mg one tablet daily with supper-to start  tonight   Monitoring Purpose and frequency of SMBG.;Taught/discussed recording of test results and interpretation of SMBG.   Acute complications Taught treatment of hypoglycemia - the 15 rule.;Discussed and identified patients' treatment of hyperglycemia.   Psychosocial adjustment Role of stress on diabetes   Preconception care Pregnancy and GDM  Role of pre-pregnancy blood glucose control on the development of the fetus;Role of family planning for patients with diabetes;Reviewed with patient blood glucose goals with pregnancy   Personal strategies to promote health Lifestyle issues that need to be addressed for better diabetes care  Individualized Plan for Diabetes Self-Management Training:   Learning Objective:  Patient will have a greater understanding of diabetes self-management. Patient education plan is to attend individual and/or group sessions per assessed needs and concerns.   Plan:   Patient Instructions  Read booklet on Gestational Diabetes Follow Gestational Meal Planning Guidelines Complete a 3 Day Food Record and bring to next appointment Check blood sugars 4 x day - before breakfast and 2  hrs after every meal and record  Bring blood sugar log to all appointments Walk 20-30 minutes at least 5 x week if permitted by MD Next appointment    07-04-15 Call if questions arise   Expected Outcomes:   positive  Education material provided: General Meal Planning Guidelines for GDM, GDM booklet, GDM video  If problems or questions, patient to contact team via:  [phone]  Future DSME appointment:  07-04-15

## 2015-06-25 NOTE — Patient Instructions (Signed)
Read booklet on Gestational Diabetes Follow Gestational Meal Planning Guidelines Complete a 3 Day Food Record and bring to next appointment Check blood sugars 4 x day - before breakfast and 2 hrs after every meal and record  Bring blood sugar log to all appointments Walk 20-30 minutes at least 5 x week if permitted by MD Next appointment    07-04-15 Call if questions arise

## 2015-07-04 ENCOUNTER — Encounter: Payer: Self-pay | Admitting: Dietician

## 2015-07-04 ENCOUNTER — Encounter: Payer: Medicaid Other | Admitting: Dietician

## 2015-07-04 VITALS — BP 100/58 | Ht 60.0 in | Wt 204.6 lb

## 2015-07-04 DIAGNOSIS — O24419 Gestational diabetes mellitus in pregnancy, unspecified control: Secondary | ICD-10-CM

## 2015-07-04 NOTE — Progress Notes (Signed)
   Patient's BG record indicates BGs improved since starting Glyburide, with FBGs now ranging from 68-97mg /dl, and post-meal BGs ranging 85-126 (+135, 142)  Patient's food diary indicates well balanced, carb-controlled meals and healthy food choices.  She states that her sister has helped her with meal planning after having Gestational diabetes herself.   Provided 1700kcal meal plan, and wrote individualized menus based on patient's food preferences.  Instructed patient on food safety, including avoidance of Listeriosis, and limiting mercury from fish.  Discussed importance of maintaining healthy lifestyle habits to reduce risk of Type 2 DM as well as Gestational DM with any future pregnancies.  Advised patient to use any remaining testing supplies to test some BGs after delivery, and to have BG tested ideally annually, as well as prior to attempting future pregnancies.

## 2015-07-19 ENCOUNTER — Observation Stay
Admission: EM | Admit: 2015-07-19 | Discharge: 2015-07-20 | Disposition: A | Discharge status: Home / Self Care | Payer: Medicare Other | Attending: Obstetrics and Gynecology | Admitting: Obstetrics and Gynecology

## 2015-07-19 DIAGNOSIS — E669 Obesity, unspecified: Secondary | ICD-10-CM | POA: Diagnosis not present

## 2015-07-19 DIAGNOSIS — F419 Anxiety disorder, unspecified: Secondary | ICD-10-CM | POA: Diagnosis not present

## 2015-07-19 DIAGNOSIS — O99513 Diseases of the respiratory system complicating pregnancy, third trimester: Secondary | ICD-10-CM | POA: Diagnosis not present

## 2015-07-19 DIAGNOSIS — O99343 Other mental disorders complicating pregnancy, third trimester: Secondary | ICD-10-CM | POA: Diagnosis not present

## 2015-07-19 DIAGNOSIS — Z888 Allergy status to other drugs, medicaments and biological substances status: Secondary | ICD-10-CM | POA: Diagnosis not present

## 2015-07-19 DIAGNOSIS — Z3A33 33 weeks gestation of pregnancy: Secondary | ICD-10-CM | POA: Diagnosis not present

## 2015-07-19 DIAGNOSIS — O34219 Maternal care for unspecified type scar from previous cesarean delivery: Secondary | ICD-10-CM | POA: Diagnosis not present

## 2015-07-19 DIAGNOSIS — M329 Systemic lupus erythematosus, unspecified: Secondary | ICD-10-CM | POA: Diagnosis not present

## 2015-07-19 DIAGNOSIS — O99283 Endocrine, nutritional and metabolic diseases complicating pregnancy, third trimester: Secondary | ICD-10-CM | POA: Diagnosis not present

## 2015-07-19 DIAGNOSIS — G4733 Obstructive sleep apnea (adult) (pediatric): Secondary | ICD-10-CM | POA: Diagnosis not present

## 2015-07-19 DIAGNOSIS — O09293 Supervision of pregnancy with other poor reproductive or obstetric history, third trimester: Secondary | ICD-10-CM | POA: Diagnosis not present

## 2015-07-19 DIAGNOSIS — O99213 Obesity complicating pregnancy, third trimester: Secondary | ICD-10-CM | POA: Diagnosis not present

## 2015-07-19 DIAGNOSIS — Z885 Allergy status to narcotic agent status: Secondary | ICD-10-CM | POA: Diagnosis not present

## 2015-07-19 DIAGNOSIS — O24419 Gestational diabetes mellitus in pregnancy, unspecified control: Secondary | ICD-10-CM | POA: Diagnosis not present

## 2015-07-19 DIAGNOSIS — O09523 Supervision of elderly multigravida, third trimester: Secondary | ICD-10-CM | POA: Diagnosis not present

## 2015-07-19 DIAGNOSIS — Z7982 Long term (current) use of aspirin: Secondary | ICD-10-CM | POA: Diagnosis not present

## 2015-07-19 DIAGNOSIS — O4703 False labor before 37 completed weeks of gestation, third trimester: Secondary | ICD-10-CM | POA: Diagnosis not present

## 2015-07-19 DIAGNOSIS — Z6838 Body mass index (BMI) 38.0-38.9, adult: Secondary | ICD-10-CM | POA: Diagnosis not present

## 2015-07-19 DIAGNOSIS — R109 Unspecified abdominal pain: Secondary | ICD-10-CM

## 2015-07-19 DIAGNOSIS — O2621 Pregnancy care for patient with recurrent pregnancy loss, first trimester: Secondary | ICD-10-CM

## 2015-07-19 DIAGNOSIS — O26899 Other specified pregnancy related conditions, unspecified trimester: Secondary | ICD-10-CM

## 2015-07-19 DIAGNOSIS — Z369 Encounter for antenatal screening, unspecified: Secondary | ICD-10-CM

## 2015-07-20 DIAGNOSIS — O26899 Other specified pregnancy related conditions, unspecified trimester: Secondary | ICD-10-CM

## 2015-07-20 DIAGNOSIS — O4703 False labor before 37 completed weeks of gestation, third trimester: Secondary | ICD-10-CM | POA: Diagnosis not present

## 2015-07-20 DIAGNOSIS — R109 Unspecified abdominal pain: Secondary | ICD-10-CM

## 2015-07-20 LAB — CHLAMYDIA/NGC RT PCR (ARMC ONLY)
Chlamydia Tr: NOT DETECTED
N gonorrhoeae: NOT DETECTED

## 2015-07-20 LAB — URINALYSIS COMPLETE WITH MICROSCOPIC (ARMC ONLY)
Bilirubin Urine: NEGATIVE
Glucose, UA: NEGATIVE mg/dL
Hgb urine dipstick: NEGATIVE
Ketones, ur: NEGATIVE mg/dL
Leukocytes, UA: NEGATIVE
Nitrite: NEGATIVE
Protein, ur: NEGATIVE mg/dL
Specific Gravity, Urine: 1.013 (ref 1.005–1.030)
pH: 6 (ref 5.0–8.0)

## 2015-07-20 LAB — FETAL FIBRONECTIN: Fetal Fibronectin: NEGATIVE

## 2015-07-20 MED ORDER — ACETAMINOPHEN 325 MG PO TABS: 650.0000 mg | ORAL_TABLET | ORAL | Status: DC | PRN

## 2015-07-20 NOTE — OB Triage Note (Signed)
Patient came in with complaint of contractions that started around 10:30 tonight. Patient states that since she was 30 weeks at night she has similar contractions that usually only last one to three hours. Patient rates the contractions 5 out of 10. Patient denies vaginal bleeding, discharge, or leaking of fluid. Vital signs stable and patient afebrile. Family at bedside. Will continue to monitor.

## 2015-07-20 NOTE — Final Progress Note (Signed)
Obstetrics Final Progress Note  07/20/2015 - 1:15 AM Primary OBGYN: [facility]  Chief Complaint: continued qhs UCs  History of Present Illness  40 y.o. [id number] @ [redacted]w[redacted]d (Dating: [place] 5/26), with the above CC. Pregnancy complicated by: AMA, BMI, BMI 38, h/o c-section, lupus, GDMA2, anxiety.  She states that every night for the past 3-4 weeks she always has qhs contractions that last for about an hour and goes away but today it's persistent. She states that it feels not as regular and intense but unable to tell me how often she feels them. No decreased FM, LOF or VB and nothing in the vagina in the past 1-2 days, dysuria, fevers, chills  Review of Systems: her 12 point review of systems is negative or as noted in the History of Present Illness.  Patient Active Problem List   Diagnosis Date Noted  . Abdominal pain affecting pregnancy 07/20/2015  . Chest pain 06/03/2015  . First trimester screening 02/25/2015  . Recurrent pregnancy loss with current pregnancy 02/25/2015  . Antibiotic-induced yeast infection 02/14/2015  . Rash and nonspecific skin eruption 11/14/2014  . Headache 09/08/2012  . Left arm pain 09/08/2012  . Neck pain 09/08/2012  . BIPOLAR DISORDER UNSPECIFIED 05/24/2009  . MOLE 02/22/2009  . INSOMNIA 09/21/2008  . FOOT SPRAIN 09/21/2008  . PANCREATITIS, HX OF 04/17/2008  . NEPHROLITHIASIS, HX OF 04/17/2008  . ANXIETY 02/16/2008  . THYROID NODULE 01/12/2008  . PARESTHESIA 12/14/2007  . FOLLICULITIS [id number]  . OBSTRUCTIVE SLEEP APNEA 10/03/2007  . HYPERSOMNIA 07/13/2007  . THYROID CYST 05/30/2007  . GERD 05/30/2007  . ENDOMETRIOSIS OF OTHER SPECIFIED SITES 05/30/2007  . Lupus erythematosus 05/30/2007  . HERNIATED LUMBAR [place] 05/30/2007  . BACK PAIN 05/30/2007  . PLANTAR FASCIITIS, BILATERAL 05/30/2007  . SNORING, HX OF 05/30/2007     PMHx:  Past Medical History  Diagnosis Date  . GERD (gastroesophageal reflux disease)   . Endometriosis   . Lupus ([place])   . Back  pain   . Plantar fasciitis   . Thyroid cyst   . Lumbar herniated disc   . Anxiety   . Nephrolithiasis   . Pancreatitis   . Obesity   . Migraines    PSHx:  Past Surgical History  Procedure Laterality Date  . Laparoscopy      x3  . Thyroid cyst excision      Dr. Trena Platt, [place]  . Appendectomy    . Cholecystectomy    . Cesarean section     Medications:  Prescriptions prior to admission  Medication Sig Dispense Refill Last Dose  . albuterol (VENTOLIN HFA) 108 (90 BASE) MCG/ACT inhaler Inhale 2 puffs into the lungs every 4 (four) hours as needed for wheezing. Reported on 07/04/2015   Past Month at Unknown time  . aspirin 81 MG chewable tablet Chew 81 mg by mouth daily.   07/19/2015 at Unknown time  . clonazePAM (KLONOPIN) 1 MG tablet Take 1 mg by mouth 2 (two) times daily as needed. Two tablets at bedtime   07/19/2015 at Unknown time  . esomeprazole (NEXIUM) 40 MG capsule Take 20 mg by mouth daily.    07/19/2015 at Unknown time  . glyBURIDE (DIABETA) 2.5 MG tablet Take 2.5 mg by mouth daily with supper. To start on 06/25/2015   07/19/2015 at Unknown time  . loratadine (CLARITIN) 10 MG tablet Take 10 mg by mouth daily.   07/19/2015 at Unknown time  . Multiple Vitamins-Minerals (MULTIVITAMIN PO) Take 1 tablet by mouth daily.   07/19/2015  at Unknown time  . zolpidem (AMBIEN) 10 MG tablet Take 10 mg by mouth at bedtime as needed for sleep.    07/20/2015 at Unknown time     Allergies: is allergic to prednisone; prochlorperazine edisylate; hydrocodone-acetaminophen; and morphine and related. OBHx:  OB History  Gravida Para Term Preterm AB SAB TAB Ectopic Multiple Living  9 1 1  7 5  2  1     # Outcome Date GA Lbr Len/2nd Weight Sex Delivery Anes PTL Lv  9 Current           8 SAB 05/21/12     SAB     7 Ectopic 2014          6 Ectopic 2013          5 SAB 2009             Complications: Parvovirus affecting pregnancy in first trimester, antepartum  4 SAB 2008          3 Term 2006    F CS-LTranv    Y     Complications: Preeclampsia  2 SAB 2004             Comments: System Generated. Please review and update pregnancy details.  1 SAB      SAB   FD               FHx:  Family History  Problem Relation Age of Onset  . Depression Mother     Bi-polar  . Asthma Mother   . Allergies Mother   . Hyperlipidemia    . Hypertension    . Allergies Sister   . Diabetes Sister   . Rheum arthritis Maternal Grandmother   . Heart disease     Soc Hx:  Social History   Social History  . Marital Status: Married    Spouse Name: N/A  . Number of Children: N/A  . Years of Education: N/A   Occupational History  . disability    Social History Main Topics  . Smoking status: Never Smoker   . Smokeless tobacco: Never Used  . Alcohol Use: No     Comment: discontinued use during pregnancy  . Drug Use: No  . Sexual Activity:    Partners: Male   Other Topics Concern  . Not on file   Social History Narrative    Objective    Current Vital Signs 24h Vital Sign Ranges  T 97.8 F (36.6 C) Temp  Avg: 97.8 F (36.6 C)  Min: 97.8 F (36.6 C)  Max: 97.8 F (36.6 C)  BP 119/78 mmHg BP  Min: 119/78  Max: 119/78  HR 95 Pulse  Avg: 95  Min: 95  Max: 95  RR 19 Resp  Avg: 19  Min: 19  Max: 19  SaO2   Not Delivered No Data Recorded       24 Hour I/O Current Shift I/O  Time Ins Outs       EFM: 135 baseline, +accel, no decel, mod var  Toco: q8-94m, +irritability  General: Well nourished, well developed female in no acute distress.  Skin:  Warm and dry.  Cardiovascular: Regular rate and rhythm. Respiratory:  Clear to auscultation bilateral. Normal respiratory effort Abdomen: obese, nttp Neuro/Psych:  Normal mood and affect.   SSE: visually closed, normal minimal white d/c in vault SVE: cl/long/high  Labs  nitrazine neg, wet prep neg  Radiology none   Assessment & Plan  39 y/o G7P1 @ 33/1  here for PTL evaluation. Pt currently stable *IUP: category I with accels *PTL  evaluation: reassuring exam. Will send FFN and recheck cx when results back. If still neg cx exam with FFN, can d/c to home and consider procardia prn for UC discomfort *Analgesia: no needs  Durene Romans. MD [place] Pager [phone]  ADDENDUM Still category I fetus with irregular and spaced out UCs.  U/a and FFN negative and rpt SVE negative. UCx pending D/c to home  Aletha Halim, Brooke Bonito MD [place]  Pager: [phone]

## 2015-07-20 NOTE — Discharge Summary (Signed)
Patient discharged with instructions on follow up appointment, preterm labor precautions, and when to seek medical attention. Patient ambulatory at discharge with steady gait and no complaints. Parents with patient at discharge.

## 2015-07-22 ENCOUNTER — Other Ambulatory Visit: Payer: Self-pay | Admitting: Family Medicine

## 2015-07-22 DIAGNOSIS — R8290 Unspecified abnormal findings in urine: Secondary | ICD-10-CM

## 2015-07-22 LAB — URINE CULTURE: Special Requests: NORMAL

## 2015-08-22 ENCOUNTER — Observation Stay
Admission: EM | Admit: 2015-08-22 | Discharge: 2015-08-22 | Disposition: A | Discharge status: Home / Self Care | Payer: Medicare Other | Attending: Obstetrics and Gynecology | Admitting: Obstetrics and Gynecology

## 2015-08-22 ENCOUNTER — Encounter: Payer: Self-pay | Admitting: *Deleted

## 2015-08-22 DIAGNOSIS — O99343 Other mental disorders complicating pregnancy, third trimester: Secondary | ICD-10-CM | POA: Diagnosis not present

## 2015-08-22 DIAGNOSIS — Z885 Allergy status to narcotic agent status: Secondary | ICD-10-CM | POA: Diagnosis not present

## 2015-08-22 DIAGNOSIS — E669 Obesity, unspecified: Secondary | ICD-10-CM | POA: Diagnosis not present

## 2015-08-22 DIAGNOSIS — O34211 Maternal care for low transverse scar from previous cesarean delivery: Secondary | ICD-10-CM | POA: Diagnosis not present

## 2015-08-22 DIAGNOSIS — K219 Gastro-esophageal reflux disease without esophagitis: Secondary | ICD-10-CM | POA: Diagnosis not present

## 2015-08-22 DIAGNOSIS — O99513 Diseases of the respiratory system complicating pregnancy, third trimester: Secondary | ICD-10-CM | POA: Diagnosis not present

## 2015-08-22 DIAGNOSIS — O9989 Other specified diseases and conditions complicating pregnancy, childbirth and the puerperium: Secondary | ICD-10-CM | POA: Diagnosis not present

## 2015-08-22 DIAGNOSIS — Z7984 Long term (current) use of oral hypoglycemic drugs: Secondary | ICD-10-CM | POA: Diagnosis not present

## 2015-08-22 DIAGNOSIS — O24415 Gestational diabetes mellitus in pregnancy, controlled by oral hypoglycemic drugs: Secondary | ICD-10-CM | POA: Diagnosis not present

## 2015-08-22 DIAGNOSIS — O09523 Supervision of elderly multigravida, third trimester: Secondary | ICD-10-CM | POA: Diagnosis not present

## 2015-08-22 DIAGNOSIS — Z87442 Personal history of urinary calculi: Secondary | ICD-10-CM | POA: Diagnosis not present

## 2015-08-22 DIAGNOSIS — O471 False labor at or after 37 completed weeks of gestation: Secondary | ICD-10-CM | POA: Diagnosis not present

## 2015-08-22 DIAGNOSIS — Z888 Allergy status to other drugs, medicaments and biological substances status: Secondary | ICD-10-CM | POA: Diagnosis not present

## 2015-08-22 DIAGNOSIS — O99613 Diseases of the digestive system complicating pregnancy, third trimester: Secondary | ICD-10-CM | POA: Diagnosis not present

## 2015-08-22 DIAGNOSIS — Z3A37 37 weeks gestation of pregnancy: Secondary | ICD-10-CM | POA: Diagnosis not present

## 2015-08-22 DIAGNOSIS — M329 Systemic lupus erythematosus, unspecified: Secondary | ICD-10-CM | POA: Diagnosis not present

## 2015-08-22 DIAGNOSIS — O99213 Obesity complicating pregnancy, third trimester: Secondary | ICD-10-CM | POA: Diagnosis not present

## 2015-08-22 DIAGNOSIS — Z7982 Long term (current) use of aspirin: Secondary | ICD-10-CM | POA: Diagnosis not present

## 2015-08-22 DIAGNOSIS — O4693 Antepartum hemorrhage, unspecified, third trimester: Secondary | ICD-10-CM | POA: Diagnosis not present

## 2015-08-22 DIAGNOSIS — G4733 Obstructive sleep apnea (adult) (pediatric): Secondary | ICD-10-CM | POA: Diagnosis not present

## 2015-08-22 DIAGNOSIS — F319 Bipolar disorder, unspecified: Secondary | ICD-10-CM | POA: Diagnosis not present

## 2015-08-22 DIAGNOSIS — Z369 Encounter for antenatal screening, unspecified: Secondary | ICD-10-CM

## 2015-08-22 DIAGNOSIS — N5089 Other specified disorders of the male genital organs: Secondary | ICD-10-CM | POA: Diagnosis present

## 2015-08-22 LAB — KLEIHAUER-BETKE STAIN: Fetal Cells %: 0 %

## 2015-08-22 LAB — GLUCOSE, CAPILLARY
Glucose-Capillary: 119 mg/dL — ABNORMAL HIGH (ref 65–99)
Glucose-Capillary: 73 mg/dL (ref 65–99)

## 2015-08-22 NOTE — Final Progress Note (Signed)
Physician Final Progress Note  Patient ID: SANDEEP FORESTIER MRN: [id number] DOB/AGE: [birth year] 40 y.o.  Admit date: 08/22/2015 Admitting provider: Malachy Mood, MD Discharge date: 08/22/2015   Admission Diagnoses: Leaking fluid  Discharge Diagnoses:  Irregular contracting, third trimester vaginal bleeding  Consults: None  40 year old [id number] at [redacted]w[redacted]d with AMA, GDMA2, history of prior C-section presenting with loss of fluid this AM, negative nitrazine on admission, negative ferning and pooling.  Did develop some vaginal bleeding and was monitored with cessation of bleeding, negative KB, and reassuring monitoring with only irregular contraction throughout the 4hrs of monitoring, no cervical change, no active bleeding on repeat speculum exam.     Significant Findings/ Diagnostic Studies: labs:   Results for orders placed or performed during the hospital encounter of 08/22/15 (from the past 24 hour(s))  Glucose, capillary     Status: Abnormal   Collection Time: 08/22/15 10:24 AM  Result Value Ref Range   Glucose-Capillary 119 (H) 65 - 99 mg/dL  Kleihauer-Betke stain     Status: None   Collection Time: 08/22/15  1:54 PM  Result Value Ref Range   Fetal Cells % 0 %   Quantitation Fetal Hemoglobin 0ML mL   # Vials RhIg NOT INDICATED   Glucose, capillary     Status: None   Collection Time: 08/22/15  3:57 PM  Result Value Ref Range   Glucose-Capillary 73 65 - 99 mg/dL     Procedures: NST 150, moderate variability, +accels, no decels with prolonged monitoring 4hrs  Discharge Condition: good  Disposition: 01-Home or Self Care  Diet: Regular diet  Discharge Activity: Activity as tolerated  Discharge Instructions    Fetal Kick Count:  Lie on our left side for one hour after a meal, and count the number of times your baby kicks.  If it is less than 5 times, get up, move around and drink some juice.  Repeat the test 30 minutes later.  If it is still less than 5 kicks in an hour,  notify your doctor.    Complete by:  As directed      LABOR:  When conractions begin, you should start to time them from the beginning of one contraction to the beginning  of the next.  When contractions are 5 - 10 minutes apart or less and have been regular for at least an hour, you should call your health care provider.    Complete by:  As directed      No sexual activity restrictions    Complete by:  As directed      Notify physician for bleeding from the vagina    Complete by:  As directed      Notify physician for blurring of vision or spots before the eyes    Complete by:  As directed      Notify physician for chills or fever    Complete by:  As directed      Notify physician for fainting spells, "black outs" or loss of consciousness    Complete by:  As directed      Notify physician for increase in vaginal discharge    Complete by:  As directed      Notify physician for leaking of fluid    Complete by:  As directed      Notify physician for pain or burning when urinating    Complete by:  As directed      Notify physician for pelvic pressure (sudden increase)  Complete by:  As directed      Notify physician for severe or continued nausea or vomiting    Complete by:  As directed      Notify physician for sudden gushing of fluid from the vagina (with or without continued leaking)    Complete by:  As directed      Notify physician for sudden, constant, or occasional abdominal pain    Complete by:  As directed      Notify physician if baby moving less than usual    Complete by:  As directed             Medication List    TAKE these medications        aspirin 81 MG chewable tablet  Chew 81 mg by mouth daily.     clonazePAM 1 MG tablet  Commonly known as:  KLONOPIN  Take 1 mg by mouth 2 (two) times daily as needed. Two tablets at bedtime     esomeprazole 40 MG capsule  Commonly known as:  NEXIUM  Take 20 mg by mouth daily.     glyBURIDE 2.5 MG tablet  Commonly  known as:  DIABETA  Take 2.5 mg by mouth daily with supper. To start on 06/25/2015     loratadine 10 MG tablet  Commonly known as:  CLARITIN  Take 10 mg by mouth daily.     MULTIVITAMIN PO  Take 1 tablet by mouth daily.     VENTOLIN HFA 108 (90 Base) MCG/ACT inhaler  Generic drug:  albuterol  Inhale 2 puffs into the lungs every 4 (four) hours as needed for wheezing. Reported on 08/22/2015     zolpidem 10 MG tablet  Commonly known as:  AMBIEN  Take 10 mg by mouth at bedtime as needed for sleep.           Follow-up Information    Follow up with Dorthula Nettles, MD On 08/19/2015.   Specialty:  Obstetrics and Gynecology   Contact information:   [address] [place] [address] [phone]       Total time spent taking care of this patient: 60 minutes  Signed: Dorthula Nettles 08/22/2015, 4:43 PM

## 2015-08-22 NOTE — Final Progress Note (Signed)
Donna Haas is a 40 y.o. [id number] with Estimated Date of Delivery: 09/06/15 who presents at [redacted]w[redacted]d  presenting for leaking fluid. Patient states she has been having rare contractions, small amount of vaginal bleeding after presentation, with active fetal movement.    Prenatal Course Source of Care: [facility]  with onset of care at 4 weeks Pregnancy complications or risks: H/o LTCS Gestational diabetes   Patient Active Problem List   Diagnosis Date Noted  . Leaking amniotic fluid 08/22/2015  . Recurrent pregnancy loss with current pregnancy 02/25/2015  . BIPOLAR DISORDER UNSPECIFIED 05/24/2009  . MOLE 02/22/2009  . PANCREATITIS, HX OF 04/17/2008  . NEPHROLITHIASIS, HX OF 04/17/2008  . ANXIETY 02/16/2008  . THYROID NODULE 01/12/2008  . PARESTHESIA 12/14/2007  . OBSTRUCTIVE SLEEP APNEA 10/03/2007  . Lupus erythematosus 05/30/2007    Prenatal Transfer Tool   Past Medical History  Diagnosis Date  . GERD (gastroesophageal reflux disease)   . Endometriosis   . Lupus ([place])   . Back pain   . Plantar fasciitis   . Thyroid cyst   . Lumbar herniated disc   . Anxiety   . Nephrolithiasis   . Pancreatitis   . Obesity   . Migraines     Past Surgical History  Procedure Laterality Date  . Laparoscopy      x3  . Thyroid cyst excision      Dr. Trena Platt, [place]  . Appendectomy    . Cholecystectomy    . Cesarean section      OB History  Gravida Para Term Preterm AB SAB TAB Ectopic Multiple Living  7 1 1  5 3  2  1     # Outcome Date GA Lbr Len/2nd Weight Sex Delivery Anes PTL Lv  7 Current           6 SAB 05/21/12     SAB     5 Ectopic 2014          4 Ectopic 2013          3 SAB 2009             Complications: Parvovirus affecting pregnancy in first trimester, antepartum  2 SAB 2008          1 Term 2006    F CS-LTranv   Y     Complications: Preeclampsia      Social History   Social History  . Marital Status: Married    Spouse Name: N/A  . Number of Children: N/A  .  Years of Education: N/A   Occupational History  . disability    Social History Main Topics  . Smoking status: Never Smoker   . Smokeless tobacco: Never Used  . Alcohol Use: No     Comment: discontinued use during pregnancy  . Drug Use: No  . Sexual Activity:    Partners: Male   Other Topics Concern  . None   Social History Narrative    Family History  Problem Relation Age of Onset  . Depression Mother     Bi-polar  . Asthma Mother   . Allergies Mother   . Hyperlipidemia    . Hypertension    . Allergies Sister   . Diabetes Sister   . Rheum arthritis Maternal Grandmother   . Heart disease      Prescriptions prior to admission  Medication Sig Dispense Refill Last Dose  . aspirin 81 MG chewable tablet Chew 81 mg by mouth daily.   08/21/2015 at Unknown  time  . clonazePAM (KLONOPIN) 1 MG tablet Take 1 mg by mouth 2 (two) times daily as needed. Two tablets at bedtime   08/21/2015 at Unknown time  . esomeprazole (NEXIUM) 40 MG capsule Take 20 mg by mouth daily.    08/21/2015 at Unknown time  . glyBURIDE (DIABETA) 2.5 MG tablet Take 2.5 mg by mouth daily with supper. To start on 06/25/2015   08/21/2015 at Unknown time  . loratadine (CLARITIN) 10 MG tablet Take 10 mg by mouth daily.   08/22/2015 at Unknown time  . Multiple Vitamins-Minerals (MULTIVITAMIN PO) Take 1 tablet by mouth daily.   08/21/2015 at Unknown time  . zolpidem (AMBIEN) 10 MG tablet Take 10 mg by mouth at bedtime as needed for sleep.    08/21/2015 at Unknown time  . albuterol (VENTOLIN HFA) 108 (90 BASE) MCG/ACT inhaler Inhale 2 puffs into the lungs every 4 (four) hours as needed for wheezing. Reported on 08/22/2015   Not Taking at Unknown time    Allergies  Allergen Reactions  . Prednisone Anaphylaxis  . Prochlorperazine Edisylate Anaphylaxis  . Hydrocodone-Acetaminophen Nausea Only  . Morphine And Related Nausea Only and Other (See Comments)    Mood swings, headache    Review of Systems: Negative except for what  is mentioned in HPI.  Physical Exam: BP 121/75 mmHg  Pulse 112  LMP 11/30/2014 GENERAL: Well-developed, well-nourished female in no acute distress.  ABDOMEN: Soft, nontender, nondistended, gravid. EXTREMITIES: Nontender, no edema Cervical Exam: Dilatation 1.5 cm   Effacement 30 %   Station -3   Pelvic: SSE: Fern and nitrizine negative. Vaginal bleeding present.  FHT: Category: 1 Baseline rate 135-145 bpm   Variability moderate  Accelerations present   Decelerations none Contractions: irregular   Pertinent Labs/Studies:   Results for orders placed or performed during the hospital encounter of 08/22/15 (from the past 24 hour(s))  Glucose, capillary     Status: Abnormal   Collection Time: 08/22/15 10:24 AM  Result Value Ref Range   Glucose-Capillary 119 (H) 65 - 99 mg/dL    Assessment : IUP at [redacted]w[redacted]d without evidence of ROM, vaginal bleeding  Plan: Prolonged monitoring for vaginal bleeding   Reviewed with Dr Georgianne Fick

## 2015-08-29 ENCOUNTER — Encounter
Admission: RE | Admit: 2015-08-29 | Discharge: 2015-08-29 | Disposition: A | Discharge status: Home / Self Care | Payer: Medicare Other | Source: Ambulatory Visit | Attending: Obstetrics & Gynecology | Admitting: Obstetrics & Gynecology

## 2015-08-29 HISTORY — DX: Gestational diabetes mellitus in pregnancy, unspecified control: O24.419

## 2015-08-29 LAB — CBC
HCT: 30.7 % — ABNORMAL LOW (ref 35.0–47.0)
Hemoglobin: 10.3 g/dL — ABNORMAL LOW (ref 12.0–16.0)
MCH: 27.8 pg (ref 26.0–34.0)
MCHC: 33.6 g/dL (ref 32.0–36.0)
MCV: 82.5 fL (ref 80.0–100.0)
Platelets: 219 10*3/uL (ref 150–440)
RBC: 3.72 MIL/uL — ABNORMAL LOW (ref 3.80–5.20)
RDW: 15.3 % — ABNORMAL HIGH (ref 11.5–14.5)
WBC: 10.6 10*3/uL (ref 3.6–11.0)

## 2015-08-29 LAB — RAPID HIV SCREEN (HIV 1/2 AB+AG)
HIV 1/2 Antibodies: NONREACTIVE
HIV-1 P24 Antigen - HIV24: NONREACTIVE

## 2015-08-29 NOTE — Patient Instructions (Signed)
Your procedure is scheduled on: TOMORROW Report to [facility] ON THE 3RD FLOOR AT 10:00 AM   Remember: Instructions that are not followed completely may result in serious medical risk, up to and including death, or upon the discretion of your surgeon and anesthesiologist your surgery may need to be rescheduled.    __X__ 1. Do not eat food or drink liquids after midnight. No gum chewing or hard candies.     __X__ 2. No Alcohol for 24 hours before or after surgery.   ____ 3. Bring all medications with you on the day of surgery if instructed.    __X__ 4. Notify your doctor if there is any change in your medical condition     (cold, fever, infections).     Do not wear jewelry, make-up, hairpins, clips or nail polish.  Do not wear lotions, powders, or perfumes.   Do not shave 48 hours prior to surgery. Men may shave face and neck.  Do not bring valuables to the hospital.    [facility] is not responsible for any belongings or valuables.               Contacts, dentures or bridgework may not be worn into surgery.  Leave your suitcase in the car. After surgery it may be brought to your room.  For patients admitted to the hospital, discharge time is determined by your                treatment team.   Patients discharged the day of surgery will not be allowed to drive home.   Please read over the following fact sheets that you were given:   Surgical Site Infection Prevention   ____ Take these medicines the morning of surgery with A SIP OF WATER:    1.   2.   3.   4.  5.  6.  ____ Fleet Enema (as directed)   ____ Use CHG Soap as directed  ____ Use inhalers on the day of surgery  ____ Stop metformin 2 days prior to surgery    ____ Take 1/2 of usual insulin dose the night before surgery and none on the morning of surgery.   __X__ Stop Coumadin/Plavix/aspirin on ASPIRIN  ALREADY STOPPED  ____ Stop Anti-inflammatories on    ____ Stop supplements until after surgery.     ____ Bring C-Pap to the hospital.

## 2015-08-30 ENCOUNTER — Inpatient Hospital Stay: Payer: Medicare Other | Admitting: Anesthesiology

## 2015-08-30 ENCOUNTER — Inpatient Hospital Stay
Admission: RE | Admit: 2015-08-30 | Payer: Medicare Other | Source: Ambulatory Visit | Admitting: Obstetrics & Gynecology

## 2015-08-30 ENCOUNTER — Encounter
Admission: RE | Disposition: A | Discharge status: Home / Self Care | Payer: Self-pay | Source: Home / Self Care | Attending: Obstetrics & Gynecology

## 2015-08-30 ENCOUNTER — Inpatient Hospital Stay
Admission: RE | Admit: 2015-08-30 | Discharge: 2015-09-02 | DRG: 765 | Disposition: A | Discharge status: Home / Self Care | Payer: Medicare Other | Attending: Obstetrics & Gynecology | Admitting: Obstetrics & Gynecology

## 2015-08-30 DIAGNOSIS — O9962 Diseases of the digestive system complicating childbirth: Secondary | ICD-10-CM | POA: Diagnosis present

## 2015-08-30 DIAGNOSIS — K660 Peritoneal adhesions (postprocedural) (postinfection): Secondary | ICD-10-CM | POA: Diagnosis present

## 2015-08-30 DIAGNOSIS — Z87442 Personal history of urinary calculi: Secondary | ICD-10-CM | POA: Diagnosis not present

## 2015-08-30 DIAGNOSIS — Z888 Allergy status to other drugs, medicaments and biological substances status: Secondary | ICD-10-CM | POA: Diagnosis not present

## 2015-08-30 DIAGNOSIS — Z3A39 39 weeks gestation of pregnancy: Secondary | ICD-10-CM | POA: Diagnosis not present

## 2015-08-30 DIAGNOSIS — R1084 Generalized abdominal pain: Secondary | ICD-10-CM | POA: Diagnosis not present

## 2015-08-30 DIAGNOSIS — O34212 Maternal care for vertical scar from previous cesarean delivery: Secondary | ICD-10-CM | POA: Diagnosis not present

## 2015-08-30 DIAGNOSIS — Z3493 Encounter for supervision of normal pregnancy, unspecified, third trimester: Secondary | ICD-10-CM | POA: Diagnosis not present

## 2015-08-30 DIAGNOSIS — Z302 Encounter for sterilization: Secondary | ICD-10-CM | POA: Diagnosis not present

## 2015-08-30 DIAGNOSIS — Z9851 Tubal ligation status: Secondary | ICD-10-CM | POA: Diagnosis not present

## 2015-08-30 DIAGNOSIS — M329 Systemic lupus erythematosus, unspecified: Secondary | ICD-10-CM | POA: Diagnosis present

## 2015-08-30 DIAGNOSIS — O24425 Gestational diabetes mellitus in childbirth, controlled by oral hypoglycemic drugs: Secondary | ICD-10-CM | POA: Diagnosis present

## 2015-08-30 DIAGNOSIS — Z885 Allergy status to narcotic agent status: Secondary | ICD-10-CM | POA: Diagnosis not present

## 2015-08-30 DIAGNOSIS — O34219 Maternal care for unspecified type scar from previous cesarean delivery: Secondary | ICD-10-CM | POA: Diagnosis not present

## 2015-08-30 DIAGNOSIS — K219 Gastro-esophageal reflux disease without esophagitis: Secondary | ICD-10-CM | POA: Diagnosis present

## 2015-08-30 DIAGNOSIS — O34211 Maternal care for low transverse scar from previous cesarean delivery: Secondary | ICD-10-CM | POA: Diagnosis present

## 2015-08-30 DIAGNOSIS — G8918 Other acute postprocedural pain: Secondary | ICD-10-CM | POA: Diagnosis not present

## 2015-08-30 DIAGNOSIS — O9989 Other specified diseases and conditions complicating pregnancy, childbirth and the puerperium: Secondary | ICD-10-CM | POA: Diagnosis present

## 2015-08-30 LAB — CREATININE, SERUM
Creatinine, Ser: 0.55 mg/dL (ref 0.44–1.00)
GFR calc Af Amer: >60 mL/min
GFR calc non Af Amer: >60 mL/min

## 2015-08-30 LAB — TYPE AND SCREEN
ABO/RH(D): O POS
Antibody Screen: NEGATIVE

## 2015-08-30 LAB — ABO/RH: ABO/RH(D): O POS

## 2015-08-30 SURGERY — Surgical Case
Anesthesia: Spinal | Laterality: Bilateral

## 2015-08-30 MED ORDER — ONDANSETRON HCL 4 MG/2ML IJ SOLN
INTRAMUSCULAR | Status: DC | PRN
  Administered 2015-08-30: 4 mg via INTRAVENOUS

## 2015-08-30 MED ORDER — BUPIVACAINE LIPOSOME 1.3 % IJ SUSP
INTRAMUSCULAR | Status: DC | PRN
  Administered 2015-08-30: 70 mL

## 2015-08-30 MED ORDER — WITCH HAZEL-GLYCERIN EX PADS: 1.0000 "application " | MEDICATED_PAD | CUTANEOUS | Status: DC | PRN

## 2015-08-30 MED ORDER — DIBUCAINE 1 % RE OINT: 1.0000 "application " | TOPICAL_OINTMENT | RECTAL | Status: DC | PRN

## 2015-08-30 MED ORDER — PANTOPRAZOLE SODIUM 40 MG PO TBEC
40.0000 mg | DELAYED_RELEASE_TABLET | Freq: Every day | ORAL | Status: DC
  Administered 2015-08-30 – 2015-09-01 (×3): 40 mg via ORAL
  Filled 2015-08-30 (×3): qty 1

## 2015-08-30 MED ORDER — SODIUM CHLORIDE 0.9% FLUSH: 3.0000 mL | INTRAVENOUS | Status: DC | PRN

## 2015-08-30 MED ORDER — ONDANSETRON HCL 4 MG/2ML IJ SOLN: 4.0000 mg | Freq: Once | INTRAMUSCULAR | Status: DC | PRN

## 2015-08-30 MED ORDER — ACETAMINOPHEN 650 MG RE SUPP
650.0000 mg | RECTAL | Status: AC | PRN
  Administered 2015-08-30: 650 mg via RECTAL
  Filled 2015-08-30 (×2): qty 1

## 2015-08-30 MED ORDER — SODIUM CHLORIDE 0.9 % IV SOLN: 250.0000 mL | INTRAVENOUS | Status: DC

## 2015-08-30 MED ORDER — CITRIC ACID-SODIUM CITRATE 334-500 MG/5ML PO SOLN
30.0000 mL | ORAL | Status: AC
  Administered 2015-08-30: 30 mL via ORAL
  Filled 2015-08-30: qty 30
  Filled 2015-08-30: qty 15

## 2015-08-30 MED ORDER — LACTATED RINGERS IV SOLN
Freq: Once | INTRAVENOUS | Status: AC
  Administered 2015-08-30: 12:00:00 via INTRAVENOUS

## 2015-08-30 MED ORDER — SODIUM CHLORIDE FLUSH 0.9 % IV SOLN
INTRAVENOUS | Status: AC
  Filled 2015-08-30: qty 10

## 2015-08-30 MED ORDER — ZOLPIDEM TARTRATE 5 MG PO TABS
5.0000 mg | ORAL_TABLET | Freq: Every evening | ORAL | Status: DC | PRN
  Administered 2015-08-30 – 2015-09-01 (×3): 5 mg via ORAL
  Filled 2015-08-30 (×3): qty 1

## 2015-08-30 MED ORDER — OXYTOCIN 40 UNITS IN LACTATED RINGERS INFUSION - SIMPLE MED
INTRAVENOUS | Status: AC
  Filled 2015-08-30: qty 1000

## 2015-08-30 MED ORDER — FENTANYL CITRATE (PF) 100 MCG/2ML IJ SOLN
INTRAMUSCULAR | Status: DC | PRN
  Administered 2015-08-30: 25 ug via INTRATHECAL

## 2015-08-30 MED ORDER — ACETAMINOPHEN 500 MG PO TABS
1000.0000 mg | ORAL_TABLET | Freq: Four times a day (QID) | ORAL | Status: DC
  Administered 2015-08-30 – 2015-08-31 (×5): 1000 mg via ORAL
  Filled 2015-08-30 (×5): qty 2

## 2015-08-30 MED ORDER — SIMETHICONE 80 MG PO CHEW: 160.0000 mg | CHEWABLE_TABLET | Freq: Four times a day (QID) | ORAL | Status: DC | PRN

## 2015-08-30 MED ORDER — OXYCODONE HCL 5 MG PO TABS
10.0000 mg | ORAL_TABLET | ORAL | Status: DC | PRN
  Administered 2015-08-30 – 2015-08-31 (×3): 10 mg via ORAL
  Filled 2015-08-30 (×3): qty 2

## 2015-08-30 MED ORDER — CLONAZEPAM 0.5 MG PO TABS
0.5000 mg | ORAL_TABLET | Freq: Every day | ORAL | Status: DC
  Administered 2015-08-30 – 2015-09-01 (×3): 0.5 mg via ORAL
  Filled 2015-08-30 (×3): qty 1

## 2015-08-30 MED ORDER — PHENYLEPHRINE HCL 10 MG/ML IJ SOLN
INTRAMUSCULAR | Status: DC | PRN
  Administered 2015-08-30 (×6): 100 ug via INTRAVENOUS

## 2015-08-30 MED ORDER — BUPIVACAINE HCL (PF) 0.75 % IJ SOLN
INTRAMUSCULAR | Status: DC | PRN
  Administered 2015-08-30: 12 mg

## 2015-08-30 MED ORDER — PRENATAL MULTIVITAMIN CH
1.0000 | ORAL_TABLET | Freq: Every day | ORAL | Status: DC
  Administered 2015-08-31 – 2015-09-01 (×2): 1 via ORAL
  Filled 2015-08-30 (×3): qty 1

## 2015-08-30 MED ORDER — FENTANYL CITRATE (PF) 100 MCG/2ML IJ SOLN
25.0000 ug | INTRAMUSCULAR | Status: DC | PRN
  Filled 2015-08-30: qty 2

## 2015-08-30 MED ORDER — OXYCODONE HCL 5 MG PO TABS: 5.0000 mg | ORAL_TABLET | ORAL | Status: DC | PRN

## 2015-08-30 MED ORDER — SODIUM CHLORIDE 0.9% FLUSH: 3.0000 mL | Freq: Two times a day (BID) | INTRAVENOUS | Status: DC

## 2015-08-30 MED ORDER — COCONUT OIL OIL: 1.0000 "application " | TOPICAL_OIL | Status: DC | PRN

## 2015-08-30 MED ORDER — DIPHENHYDRAMINE HCL 25 MG PO CAPS: 25.0000 mg | ORAL_CAPSULE | Freq: Four times a day (QID) | ORAL | Status: DC | PRN

## 2015-08-30 MED ORDER — MENTHOL 3 MG MT LOZG: 1.0000 | LOZENGE | OROMUCOSAL | Status: DC | PRN

## 2015-08-30 MED ORDER — DEXTROSE 5 % IV SOLN
3.0000 g | Freq: Once | INTRAVENOUS | Status: AC
  Administered 2015-08-30: 3 g via INTRAVENOUS
  Filled 2015-08-30 (×2): qty 3000

## 2015-08-30 MED ORDER — OXYTOCIN 40 UNITS IN LACTATED RINGERS INFUSION - SIMPLE MED
2.5000 [IU]/h | INTRAVENOUS | Status: AC
  Administered 2015-08-30 – 2015-08-31 (×2): 2.5 [IU]/h via INTRAVENOUS
  Filled 2015-08-30: qty 1000

## 2015-08-30 MED ORDER — ENOXAPARIN SODIUM 40 MG/0.4ML ~~LOC~~ SOLN
40.0000 mg | SUBCUTANEOUS | Status: DC
  Administered 2015-08-31 – 2015-09-02 (×3): 40 mg via SUBCUTANEOUS
  Filled 2015-08-30 (×3): qty 0.4

## 2015-08-30 MED ORDER — IBUPROFEN 600 MG PO TABS
600.0000 mg | ORAL_TABLET | Freq: Four times a day (QID) | ORAL | Status: DC
  Administered 2015-08-30 – 2015-09-01 (×5): 600 mg via ORAL
  Filled 2015-08-30 (×5): qty 1

## 2015-08-30 MED ORDER — LACTATED RINGERS IV SOLN
INTRAVENOUS | Status: DC
  Administered 2015-08-30 (×2): via INTRAVENOUS

## 2015-08-30 MED ORDER — LACTATED RINGERS IV SOLN: INTRAVENOUS | Status: DC

## 2015-08-30 MED ORDER — OXYTOCIN 40 UNITS IN LACTATED RINGERS INFUSION - SIMPLE MED
INTRAVENOUS | Status: AC
  Administered 2015-08-31: 2.5 [IU]/h via INTRAVENOUS
  Filled 2015-08-30: qty 1000

## 2015-08-30 MED ORDER — BUPIVACAINE HCL (PF) 0.5 % IJ SOLN
INTRAMUSCULAR | Status: AC
  Filled 2015-08-30: qty 30

## 2015-08-30 MED ORDER — SODIUM CHLORIDE FLUSH 0.9 % IV SOLN
INTRAVENOUS | Status: AC
  Filled 2015-08-30: qty 20

## 2015-08-30 SURGICAL SUPPLY — 33 items
CANISTER SUCT 3000ML (MISCELLANEOUS) ×2
DRSG TELFA 3X8 NADH (GAUZE/BANDAGES/DRESSINGS) ×2
ELECT CAUTERY BLADE 6.4 (BLADE)
ELECT REM PT RETURN 9FT ADLT (ELECTROSURGICAL) ×2
EXTENDER TRAXI PANNICULUS (MISCELLANEOUS) ×1
GAUZE SPONGE 4X4 12PLY STRL (GAUZE/BANDAGES/DRESSINGS) ×2
GLOVE BIOGEL PI IND STRL 6.5 (GLOVE) ×6
GLOVE BIOGEL PI INDICATOR 6.5 (GLOVE) ×6
GLOVE SURG SYN 6.5 ES PF (GLOVE) ×2
GOWN STRL REUS W/ TWL LRG LVL3 (GOWN DISPOSABLE) ×3
GOWN STRL REUS W/TWL LRG LVL3 (GOWN DISPOSABLE) ×6
KIT PREVENA INCISION MGT20CM45 (CANNISTER) ×2
LIQUID BAND (GAUZE/BANDAGES/DRESSINGS) ×2
NS IRRIG 1000ML POUR BTL (IV SOLUTION) ×2
PACK C SECTION AR (MISCELLANEOUS) ×2
PAD OB MATERNITY 4.3X12.25 (PERSONAL CARE ITEMS)
PAD PREP 24X41 OB/GYN DISP (PERSONAL CARE ITEMS) ×2
STAPLER INSORB 30 2030 C-SECTI (MISCELLANEOUS) ×2
STRAP SAFETY BODY (MISCELLANEOUS) ×2
STRIP CLOSURE SKIN 1/2X4 (GAUZE/BANDAGES/DRESSINGS) ×2
SUT MNCRL 4-0 (SUTURE) ×2
SUT MNCRL 4-0 27XMFL (SUTURE) ×1
SUT PDS AB 1 TP1 96 (SUTURE) ×4
SUT PLAIN 2 0 (SUTURE) ×4
SUT PLAIN ABS 2-0 CT1 27XMFL (SUTURE) ×2
SUT VIC AB 0 CT1 36 (SUTURE) ×4
SUT VIC AB 2-0 CT1 27 (SUTURE) ×4
SUT VIC AB 2-0 CT1 TAPERPNT 27 (SUTURE) ×2
SUT VIC AB 3-0 SH 27 (SUTURE)
SUT VIC AB 3-0 SH 27X BRD (SUTURE)
SUT VICRYL 3-0 CR8 SH (SUTURE) ×2
SWABSTK COMLB BENZOIN TINCTURE (MISCELLANEOUS) ×2
TRAXI PANNICULUS EXTENDER (MISCELLANEOUS) ×1

## 2015-08-30 NOTE — Anesthesia Procedure Notes (Signed)
Spinal  Start time: 08/30/2015 2:09 PM End time: 08/30/2015 2:11 PM Staffing Resident/CRNA: Justus Memory Performed by: resident/CRNA  Preanesthetic Checklist Completed: patient identified, surgical consent, pre-op evaluation, timeout performed, IV checked, risks and benefits discussed and monitors and equipment checked Spinal Block Patient position: sitting Prep: ChloraPrep Patient monitoring: continuous pulse ox and blood pressure Approach: midline Location: L3-4 Injection technique: single-shot Needle Needle type: Pencil-Tip  Needle gauge: 25 G Assessment Sensory level: T4 Additional Notes Bilateral T4 levels obtained

## 2015-08-30 NOTE — Op Note (Addendum)
Cesarean Section Procedure Note  08/30/2015  Patient:  Donna Haas  40 y.o. female Preoperative diagnosis:  PRIOR CSECTION, 39WEEKS, GDMA2, Lupus Postoperative diagnosis:  PRIOR CSECTION, 39WEEKS, GDMA2, Lupus  PROCEDURE:  Procedure(s): CESAREAN SECTION WITH BILATERAL SALPINGECTOMY Surgeon:  Surgeon(s) and Role:    * Chelsea Loletha Grayer Ward, MD - Primary Assistant: Rod Can, CNM Anesthesia:  Spinal I/O: Total I/O In: 1700 [I.V.:1700] Out: 900 [Urine:150; Blood:750] Specimens:  Complete right tube with fimbriated end, Complete left tube with fimbriated end, Cord blood Findings: Birth Weight: 7 lb 15 oz (3600 g), APGAR: 9,9. Nuchal cord x1 (loose), normal appearing bilateral tubes and ovaries.  Omental adhesion to anterior abdominal wall  Complications: None Apparent Disposition:  VS stable to PACU  Indication for procedure: 39yo [id number] @ 39.0 weeks with pregnancy complicated by history of prior cesarean, electing for repeat, GDMA2 on glyburide, SLE, Obesity.  Procedure Details   The risks, benefits, complications, treatment options, and expected outcomes were discussed with the patient. Informed consent was obtained. The patient was taken to Operating Room, identified as Donna Haas and the procedure verified as a cesarean delivery with bilateral salpingectomy.  Definitive management and no option for tubal reversal were explained in detail and patient demonstrated understanding, and elected for salpingectomy vs partial tubal ligation.   After administration of anesthesia, the patient was prepped and draped in the usual sterile manner, including a vaginal prep and pannus retractor.  A surgical time out was performed, with the pediatric team present. A Pfannenstiel incision was made and carried down through the subcutaneous tissue to the fascia. Fascial incision was made and extended transversely. The fascia was separated from the underlying rectus tissue superiorly and inferiorly. The  peritoneum was identified and entered. Peritoneal incision was extended longitudinally.  A low transverse uterine incision was made. Delivered from cephalic presentation was a Live born female with loose nuchal cord x 1. Delayed cord clamping was performed for 60 seconds, and we sang Happy Birthday to [employer].  The umbilical cord was doubly clamped and cut, and the baby was handed off to the awaitng pediatrician.  Cord blood was obtained for evaluation. The placenta was removed intact and appeared normal. The uterus was cleared of clots, membranes, and debris. The uterus, tubes and ovaries appeared normal. The uterine incision was closed with running locking sutures of 0 Vicryl, and then a second, imbricating stitch was placed. Hemostasis was observed. The abdominal cavity was evacuated of extraneous fluid.  The attention was turned to the bilateral fallopian tubes.  The tubes were each traced to their fimbriated ends, and a Kelly clamp was placed sequentially along the mesosalpinx and metzenbaum scissors were used to transect the tissues from the cornua to the ovary. 3-0 vicryl was placed in the Haney fashion for hemostasis with each bite.  The specimens were handed off as "right and left tube with fimbriated ends."  The uterus was returned to the abdominal cavity and again the incisions were inspected for hemostasis, which was confirmed.  The paracolic gutters were cleared, and the tubal sutures were observed to be intact and hemostatic.    The fascia was then reapproximated with running suture of vicryl. After a change of gloves, the subcutaneous tissue was irrigated and reapproximated with 0- plain gut. The skin was closed with insorb staples, and 68ml of Experel, Marcaine and Saline was injected deeply into the fascia, sub-q tissues, and skin.  A sterile wound vac was placed over the incision.  Instrument, sponge,  and needle counts were correct prior the abdominal closure and at the conclusion of the  case.   I was present and performed this procedure in its entirety.  ----- Larey Days, MD Attending Obstetrician and Gynecologist [facility]

## 2015-08-30 NOTE — H&P (Signed)
H&P Update  PLEASE SEE PAPER H&P  Pt was last seen in my office, and complete history and physical performed.  The surgical history has been reviewed and remains accurate without interval change. The patient was re-examined and patient's physiologic condition has not changed significantly in the last 30 days.  No new pharmacological allergies or types of therapy has been initiated.  Allergies  Allergen Reactions  . Prednisone Anaphylaxis  . Prochlorperazine Edisylate Anaphylaxis  . Hydrocodone-Acetaminophen Nausea Only  . Morphine And Related Nausea Only and Other (See Comments)    Mood swings, headache    Past Medical History  Diagnosis Date  . GERD (gastroesophageal reflux disease)   . Endometriosis   . Lupus ([place])   . Back pain   . Plantar fasciitis   . Thyroid cyst   . Lumbar herniated disc   . Anxiety   . Nephrolithiasis   . Pancreatitis   . Obesity   . Migraines   . Gestational diabetes    Past Surgical History  Procedure Laterality Date  . Laparoscopy      x3  . Thyroid cyst excision      Dr. Trena Platt, [place]  . Appendectomy    . Cholecystectomy    . Cesarean section      Temp(Src) 98.2 F (36.8 C)  Ht 5' (1.524 m)  Wt 94.348 kg (208 lb)  BMI 40.62 kg/m2  LMP 11/30/2014  NAD RRR no murmurs CTAB, no wheezing, resps unlabored +BS, soft, NTTP No c/c/e Pelvic exam deferred  The above history was confirmed with the patient. The condition still exists that makes this procedure necessary. Surgical plan includes Cesarean Delivery and Bilateral Tubal Ligation/Salpingectomy as confirmed on the consent. The treatment plan remains the same, without new options for care.  The patient understands the potential benefits and risks and the consents have been signed and placed on the chart.     Larey Days, MD Attending Obstetrician Gynecologist [facility]

## 2015-08-30 NOTE — Progress Notes (Signed)
Awaiting the arrival of OR team. Pt kept informed of delay and progress.

## 2015-08-30 NOTE — Plan of Care (Signed)
Pt arrives in l/d for scheduled repeat c section and BTL

## 2015-08-30 NOTE — Plan of Care (Signed)
FHR in OR- 150

## 2015-08-30 NOTE — Anesthesia Preprocedure Evaluation (Signed)
Anesthesia Evaluation  Patient identified by MRN, date of birth, ID band Patient awake    Reviewed: Allergy & Precautions, H&P , NPO status , Patient's Chart, lab work & pertinent test results, reviewed documented beta blocker date and time   Airway Mallampati: III  TM Distance: >3 FB Neck ROM: full    Dental no notable dental hx. (+) Teeth Intact   Pulmonary neg pulmonary ROS, sleep apnea and Continuous Positive Airway Pressure Ventilation ,    Pulmonary exam normal breath sounds clear to auscultation       Cardiovascular Exercise Tolerance: Good negative cardio ROS Normal cardiovascular exam Rhythm:regular Rate:Normal     Neuro/Psych  Headaches, PSYCHIATRIC DISORDERS negative neurological ROS  negative psych ROS   GI/Hepatic negative GI ROS, Neg liver ROS, GERD  Medicated,  Endo/Other  negative endocrine ROSdiabetes  Renal/GU Renal diseasenegative Renal ROS  negative genitourinary   Musculoskeletal   Abdominal   Peds  Hematology negative hematology ROS (+)   Anesthesia Other Findings   Reproductive/Obstetrics (+) Pregnancy                             Anesthesia Physical Anesthesia Plan  ASA: III  Anesthesia Plan: Spinal   Post-op Pain Management:    Induction:   Airway Management Planned:   Additional Equipment:   Intra-op Plan:   Post-operative Plan:   Informed Consent: I have reviewed the patients History and Physical, chart, labs and discussed the procedure including the risks, benefits and alternatives for the proposed anesthesia with the patient or authorized representative who has indicated his/her understanding and acceptance.     Plan Discussed with: CRNA  Anesthesia Plan Comments:         Anesthesia Quick Evaluation

## 2015-08-30 NOTE — Plan of Care (Signed)
Pt transported to OR via labor bed for scheduled c section

## 2015-08-30 NOTE — Plan of Care (Signed)
Foley catheter inserted in OR by dr ward after betadine vaginal prep

## 2015-08-30 NOTE — Plan of Care (Signed)
Received pt in l/d PACU.Marland Kitchen See  Recovery flow sheet

## 2015-08-30 NOTE — Discharge Summary (Signed)
Obstetrical Discharge Summary  Patient Name: Donna Haas DOB: 04/12/1976 MRN: [id number]  Date of Admission: 08/30/2015 Date of Discharge: 09/02/15  Primary OB: [facility]   Gestational Age at Delivery: [redacted]w[redacted]d   Antepartum complications: SLE, obesity, GDMA2  Admitting Diagnosis: 39 weeks, with history of prior cesarean, desired permanent sterilization Secondary Diagnosis: Patient Active Problem List   Diagnosis Date Noted  . Previous cesarean delivery affecting pregnancy 08/30/2015  . Leaking seminal fluid 08/22/2015  . Abdominal pain affecting pregnancy 07/20/2015  . Chest pain 06/03/2015  . First trimester screening 02/25/2015  . Recurrent pregnancy loss with current pregnancy 02/25/2015  . Antibiotic-induced yeast infection 02/14/2015  . Rash and nonspecific skin eruption 11/14/2014  . Headache 09/08/2012  . Left arm pain 09/08/2012  . Neck pain 09/08/2012  . BIPOLAR DISORDER UNSPECIFIED 05/24/2009  . MOLE 02/22/2009  . INSOMNIA 09/21/2008  . FOOT SPRAIN 09/21/2008  . PANCREATITIS, HX OF 04/17/2008  . NEPHROLITHIASIS, HX OF 04/17/2008  . ANXIETY 02/16/2008  . THYROID NODULE 01/12/2008  . PARESTHESIA 12/14/2007  . FOLLICULITIS [id number]  . OBSTRUCTIVE SLEEP APNEA 10/03/2007  . HYPERSOMNIA 07/13/2007  . THYROID CYST 05/30/2007  . GERD 05/30/2007  . ENDOMETRIOSIS OF OTHER SPECIFIED SITES 05/30/2007  . Lupus erythematosus 05/30/2007  . HERNIATED LUMBAR [place] 05/30/2007  . BACK PAIN 05/30/2007  . PLANTAR FASCIITIS, BILATERAL 05/30/2007  . SNORING, HX OF 05/30/2007    Augmentation: n/a Complications: None apparent Intrapartum complications/course: patient was admitted for scheduled cesarean with bilateral salpingectomy Date of Delivery: 08/30/15 Delivered By: Vikki Ports Ward Delivery Type: repeat cesarean section, low transverse incision Anesthesia: spinal Placenta: expressed Laceration: n/a Episiotomy: none Newborn Data: Live born female  Birth Weight: 7 lb  15 oz (3600 g) APGAR: 9,9     Discharge Physical Exam:  Filed Vitals:   09/01/15 2335 09/01/15 2336 09/02/15 0238 09/02/15 0754  BP: 142/93 128/78 122/70 122/68  Pulse: 86 72 79 80  Temp: 97.7 F (36.5 C)  97.9 F (36.6 C) 98.9 F (37.2 C)  TempSrc: Oral  Oral Oral  Resp: 19  18 20   Height:      Weight:      SpO2: 99%       General: NAD CV: RRR Pulm: CTABL, nl effort ABD: s/nd/nt, fundus firm and below the umbilicus Lochia: moderate Incision: c/d/i -- wound vac in place and working well. DVT Evaluation: LE non-ttp, no evidence of DVT on exam.  HEMOGLOBIN  Date Value Ref Range Status  08/29/2015 10.3* 12.0 - 16.0 g/dL Final   HCT  Date Value Ref Range Status  08/29/2015 30.7* 35.0 - 47.0 % Final  Postop Hct: 29.1  Post partum course: Normal postpartum course. Decided to bottle feed. Ambulating and meeting pain criteria.  Postpartum Procedures: none Disposition: stable, discharge to home. Baby Feeding: breastmilk Baby Disposition: home with mom  Rh Immune globulin given: n/a Rubella vaccine given: n/a Tdap vaccine given in AP or PP setting: AP Flu vaccine given in AP or PP setting: n/a  Contraception: Bilateral salpingectomy    Plan:  Donna Haas was discharged to home in good condition. Follow-up appointment at [facility] with Dr Leonides Schanz in 1 week for wound vac removal   Discharge Medications:    Medication List    STOP taking these medications        aspirin 81 MG chewable tablet     glyBURIDE 2.5 MG tablet  Commonly known as:  DIABETA     MULTIVITAMIN PO  TAKE these medications        clonazePAM 1 MG tablet  Commonly known as:  KLONOPIN  Take 1 mg by mouth 2 (two) times daily as needed. Two tablets at bedtime     esomeprazole 40 MG capsule  Commonly known as:  NEXIUM  Take 20 mg by mouth daily.     ibuprofen 600 MG tablet  Commonly known as:  ADVIL,MOTRIN  Take 1 tablet (600 mg total) by mouth every 4 (four) hours as  needed for mild pain or moderate pain.     loratadine 10 MG tablet  Commonly known as:  CLARITIN  Take 10 mg by mouth daily.     oxyCODONE-acetaminophen 5-325 MG tablet  Commonly known as:  PERCOCET/ROXICET  Take 1-2 tablets by mouth every 3 (three) hours as needed for moderate pain.     zolpidem 5 MG tablet  Commonly known as:  AMBIEN  Take 1 tablet (5 mg total) by mouth at bedtime as needed for sleep (insomnia).        Burlene Arnt, CNM

## 2015-08-30 NOTE — Transfer of Care (Signed)
Immediate Anesthesia Transfer of Care Note  Patient: Donna Haas  Procedure(s) Performed: Procedure(s): CESAREAN SECTION WITH BILATERAL TUBAL LIGATION (Bilateral)  Patient Location: PACU  Anesthesia Type:Spinal  Level of Consciousness: awake, alert  and oriented  Airway & Oxygen Therapy: Patient Spontanous Breathing  Post-op Assessment: Report given to RN  Post vital signs: Reviewed and stable  Last Vitals:  Filed Vitals:   08/30/15 1038 08/30/15 1552  BP: 120/74 116/71  Pulse: 91 70  Temp: 36.8 C 97.72F  Resp: 20 20    Last Pain: There were no vitals filed for this visit.       Complications: No apparent anesthesia complications

## 2015-08-31 LAB — CBC
HCT: 29.1 % — ABNORMAL LOW (ref 35.0–47.0)
Hemoglobin: 9.7 g/dL — ABNORMAL LOW (ref 12.0–16.0)
MCH: 27.5 pg (ref 26.0–34.0)
MCHC: 33.3 g/dL (ref 32.0–36.0)
MCV: 82.7 fL (ref 80.0–100.0)
Platelets: 184 10*3/uL (ref 150–440)
RBC: 3.52 MIL/uL — ABNORMAL LOW (ref 3.80–5.20)
RDW: 15.1 % — ABNORMAL HIGH (ref 11.5–14.5)
WBC: 9.7 10*3/uL (ref 3.6–11.0)

## 2015-08-31 LAB — GLUCOSE, CAPILLARY
Glucose-Capillary: 190 mg/dL — ABNORMAL HIGH (ref 65–99)
Glucose-Capillary: 106 mg/dL — ABNORMAL HIGH (ref 65–99)

## 2015-08-31 LAB — SYPHILIS: RPR W/REFLEX TO RPR TITER AND TREPONEMAL ANTIBODIES, TRADITIONAL SCREENING AND DIAGNOSIS ALGORITHM
RPR Ser Ql: NONREACTIVE
RPR Ser Ql: NONREACTIVE

## 2015-08-31 MED ORDER — OXYCODONE-ACETAMINOPHEN 5-325 MG PO TABS
1.0000 | ORAL_TABLET | ORAL | Status: DC | PRN
  Administered 2015-09-01: 1 via ORAL
  Administered 2015-09-01: 2 via ORAL
  Administered 2015-09-01: 1 via ORAL
  Administered 2015-09-01 – 2015-09-02 (×3): 2 via ORAL
  Administered 2015-09-02: 1 via ORAL
  Filled 2015-08-31: qty 1
  Filled 2015-08-31 (×2): qty 2
  Filled 2015-08-31: qty 1
  Filled 2015-08-31 (×3): qty 2

## 2015-08-31 NOTE — Anesthesia Postprocedure Evaluation (Addendum)
Anesthesia Post Note  Patient: Donna Haas  Procedure(s) Performed: Procedure(s) (LRB): CESAREAN SECTION WITH BILATERAL TUBAL LIGATION (Bilateral)  Patient location during evaluation: Mother Baby Anesthesia Type: Spinal Level of consciousness: awake and alert Pain management: pain level controlled Vital Signs Assessment: post-procedure vital signs reviewed and stable Respiratory status: spontaneous breathing, nonlabored ventilation and respiratory function stable Cardiovascular status: stable Postop Assessment: no headache, no backache and patient able to bend at knees Anesthetic complications: no    Last Vitals:  Filed Vitals:   08/31/15 0745 08/31/15 1227  BP: 114/74 124/76  Pulse: 81 90  Temp: 36.7 C 36.5 C  Resp: 20 18    Last Pain:  Filed Vitals:   08/31/15 1229  PainSc: 0-No pain                 Precious Haws Edger Husain

## 2015-08-31 NOTE — Anesthesia Post-op Follow-up Note (Signed)
  Anesthesia Pain Follow-up Note  Patient: Donna Haas  Day #: 1  Date of Follow-up: 08/31/2015 Time: 12:43 PM  Last Vitals:  Filed Vitals:   08/31/15 0745 08/31/15 1227  BP: 114/74 124/76  Pulse: 81 90  Temp: 36.7 C 36.5 C  Resp: 20 18    Level of Consciousness: alert  Pain: mild   Side Effects:None  Catheter Site Exam:clean, dry, no drainage  Plan: D/C from anesthesia care  [place]

## 2015-08-31 NOTE — Progress Notes (Signed)
Admit Date: 08/30/2015 Today's Date: 08/31/2015  Subjective: Postpartum Day 1: Cesarean Delivery Patient reports incisional pain, tolerating PO and no problems voiding.    Objective: Vital signs in last 24 hours: Temp:  [98 F (36.7 C)-98.3 F (36.8 C)] 98 F (36.7 C) (05/20 0745) Pulse Rate:  [81-103] 81 (05/20 0745) Resp:  [18-20] 20 (05/20 0745) BP: (114-147)/(68-127) 114/74 mmHg (05/20 0745) SpO2:  [98 %-100 %] 100 % (05/20 0504) Weight:  [94.348 kg (208 lb)] 94.348 kg (208 lb) (05/19 1209)  Physical Exam:  General: alert, cooperative and no distress Lochia: appropriate Uterine Fundus: firm Incision: dressing intact (proveena) DVT Evaluation: No evidence of DVT seen on physical exam.   Recent Labs  08/29/15 1543 08/31/15 0601  HGB 10.3* 9.7*  HCT 30.7* 29.1*    Assessment/Plan: Status post Cesarean section, BTL.  Doing well postoperatively.  Continue current care. BS daily, no further Glyburide. Ambulate.  Shower. Breast feeding  Hoyt Koch 08/31/2015, 10:48 AM

## 2015-09-01 LAB — GLUCOSE, CAPILLARY: Glucose-Capillary: 120 mg/dL — ABNORMAL HIGH (ref 65–99)

## 2015-09-01 MED ORDER — IBUPROFEN 600 MG PO TABS
600.0000 mg | ORAL_TABLET | ORAL | Status: DC | PRN
  Administered 2015-09-01 – 2015-09-02 (×4): 600 mg via ORAL
  Filled 2015-09-01 (×4): qty 1

## 2015-09-01 MED ORDER — OXYCODONE HCL 5 MG PO TABS
10.0000 mg | ORAL_TABLET | ORAL | Status: DC | PRN
  Administered 2015-09-01: 10 mg via ORAL
  Filled 2015-09-01: qty 2

## 2015-09-01 NOTE — Progress Notes (Signed)
Admit Date: 08/30/2015 Today's Date: 09/01/2015  Subjective: Postpartum Day 2: Cesarean Delivery Patient reports minimal incisional pain, tolerating PO and no problems voiding.    Objective: Vital signs in last 24 hours: Temp:  [97.7 F (36.5 C)-98.6 F (37 C)] 98 F (36.7 C) (05/21 0827) Pulse Rate:  [90-98] 92 (05/21 0827) Resp:  [18-20] 18 (05/21 0827) BP: (124-136)/(76-87) 136/87 mmHg (05/21 0827) SpO2:  [99 %] 99 % (05/21 0827)  Physical Exam:  General: alert, cooperative and no distress Lochia: appropriate Uterine Fundus: firm Incision: dressing intact (proveena) DVT Evaluation: No evidence of DVT seen on physical exam.   Recent Labs  08/29/15 1543 08/31/15 0601  HGB 10.3* 9.7*  HCT 30.7* 29.1*  BS 120 this am  Assessment/Plan: Status post Cesarean section, BTL.  Doing well postoperatively.  Continue current care. BS daily, no further Glyburide. Ambulate.  Shower. Breast feeding Baby has jaundice and is need of another day stay  Hoyt Koch 09/01/2015, 10:20 AM

## 2015-09-02 ENCOUNTER — Encounter: Payer: Self-pay | Admitting: Obstetrics & Gynecology

## 2015-09-02 ENCOUNTER — Other Ambulatory Visit: Payer: Medicaid Other

## 2015-09-02 LAB — CREATININE, SERUM
Creatinine, Ser: 0.68 mg/dL (ref 0.44–1.00)
GFR calc Af Amer: >60 mL/min
GFR calc non Af Amer: >60 mL/min

## 2015-09-02 MED ORDER — OXYCODONE-ACETAMINOPHEN 5-325 MG PO TABS: 1.0000 | ORAL_TABLET | ORAL | Status: DC | PRN

## 2015-09-02 MED ORDER — ZOLPIDEM TARTRATE 5 MG PO TABS: 5.0000 mg | ORAL_TABLET | Freq: Every evening | ORAL | Status: DC | PRN

## 2015-09-02 MED ORDER — IBUPROFEN 600 MG PO TABS: 600.0000 mg | ORAL_TABLET | ORAL | Status: DC | PRN

## 2015-09-02 NOTE — Discharge Instructions (Signed)
Cesarean Delivery, Care After °Refer to this sheet in the next few weeks. These instructions provide you with information on caring for yourself after your procedure. Your health care provider may also give you specific instructions. Your treatment has been planned according to current medical practices, but problems sometimes occur. Call your health care provider if you have any problems or questions after you go home. °HOME CARE INSTRUCTIONS  °· Only take over-the-counter or prescription medications as directed by your health care provider. °· Do not drink alcohol, especially if you are breastfeeding or taking medication to relieve pain. °· Do not chew or smoke tobacco. °· Continue to use good perineal care. Good perineal care includes: °¨ Wiping your perineum from front to back. °¨ Keeping your perineum clean. °· Check your surgical cut (incision) daily for increased redness, drainage, swelling, or separation of skin. °· Clean your incision gently with soap and water every day, and then pat it dry. If your health care provider says it is okay, leave the incision uncovered. Use a bandage (dressing) if the incision is draining fluid or appears irritated. If the adhesive strips across the incision do not fall off within 7 days, carefully peel them off. °· Hug a pillow when coughing or sneezing until your incision is healed. This helps to relieve pain. °· Do not use tampons or douche until your health care provider says it is okay. °· Shower, wash your hair, and take tub baths as directed by your health care provider. °· Wear a well-fitting bra that provides breast support. °· Limit wearing support panties or control-top hose. °· Drink enough fluids to keep your urine clear or pale yellow. °· Eat high-fiber foods such as whole grain cereals and breads, brown rice, beans, and fresh fruits and vegetables every day. These foods may help prevent or relieve constipation. °· Resume activities such as climbing stairs,  driving, lifting, exercising, or traveling as directed by your health care provider. °· Talk to your health care provider about resuming sexual activities. This is dependent upon your risk of infection, your rate of healing, and your comfort and desire to resume sexual activity. °· Try to have someone help you with your household activities and your newborn for at least a few days after you leave the hospital. °· Rest as much as possible. Try to rest or take a nap when your newborn is sleeping. °· Increase your activities gradually. °· Keep all of your scheduled postpartum appointments. It is very important to keep your scheduled follow-up appointments. At these appointments, your health care provider will be checking to make sure that you are healing physically and emotionally. °SEEK MEDICAL CARE IF:  °· You are passing large clots from your vagina. Save any clots to show your health care provider. °· You have a foul smelling discharge from your vagina. °· You have trouble urinating. °· You are urinating frequently. °· You have pain when you urinate. °· You have a change in your bowel movements. °· You have increasing redness, pain, or swelling near your incision. °· You have pus draining from your incision. °· Your incision is separating. °· You have painful, hard, or reddened breasts. °· You have a severe headache. °· You have blurred vision or see spots. °· You feel sad or depressed. °· You have thoughts of hurting yourself or your newborn. °· You have questions about your care, the care of your newborn, or medications. °· You are dizzy or light-headed. °· You have a rash. °· You   have pain, redness, or swelling at the site of the removed intravenous access (IV) tube.  You have nausea or vomiting.  You stopped breastfeeding and have not had a menstrual period within 12 weeks of stopping.  You are not breastfeeding and have not had a menstrual period within 12 weeks of delivery.  You have a fever. SEEK  IMMEDIATE MEDICAL CARE IF:  You have persistent pain.  You have chest pain.  You have shortness of breath.  You faint.  You have leg pain.  You have stomach pain.  Your vaginal bleeding saturates 2 or more sanitary pads in 1 hour. MAKE SURE YOU:   Understand these instructions.  Will watch your condition.  Will get help right away if you are not doing well or get worse.   This information is not intended to replace advice given to you by your health care provider. Make sure you discuss any questions you have with your health care provider.   Document Released: 12/20/2001 Document Revised: 04/20/2014 Document Reviewed: 11/25/2011 Elsevier Interactive Patient Education 2016 [employer]. Follow up sooner with fever greater than 100.5, problems breathing, pain not helped by medications, severe depression ( more than just baby blues, wanting to hurt yourself or the baby), severe bleeding( saturating more than one pad an hour or large palm sized clots),or any incisional concerns such as increased redness, swelling, discharge or increased pain in incision.  No heavy lifting for 6 weeks.  No driving while taking narcotics. No douches, intercourse, tampons, or enemas for 6 weeks.

## 2015-09-02 NOTE — Progress Notes (Signed)
Patient discharged home esocorted out by auxillary in wheelchair with infant in her arms.

## 2015-09-02 NOTE — Progress Notes (Signed)
All discharge instructions given to patient and she voices understanding of all instructions given.  Prescriptions given.

## 2015-09-02 NOTE — Care Management Important Message (Signed)
Important Message  Patient Details  Name: Donna Haas MRN: [id number] Date of Birth: 17-Jul-1975   Medicare Important Message Given:  Yes    Donna Haas 09/02/2015, 12:18 PM

## 2015-09-03 LAB — SURGICAL PATHOLOGY

## 2016-01-21 ENCOUNTER — Encounter: Payer: Self-pay | Admitting: Family Medicine

## 2016-01-21 ENCOUNTER — Ambulatory Visit (INDEPENDENT_AMBULATORY_CARE_PROVIDER_SITE_OTHER): Payer: Medicare Other | Admitting: Family Medicine

## 2016-01-21 VITALS — BP 132/80 | HR 90 | Temp 98.2°F | Resp 14 | Ht 60.0 in | Wt 214.0 lb

## 2016-01-21 DIAGNOSIS — K2270 Barrett's esophagus without dysplasia: Secondary | ICD-10-CM | POA: Diagnosis not present

## 2016-01-21 DIAGNOSIS — R635 Abnormal weight gain: Secondary | ICD-10-CM | POA: Diagnosis not present

## 2016-01-21 DIAGNOSIS — D649 Anemia, unspecified: Secondary | ICD-10-CM | POA: Diagnosis not present

## 2016-01-21 DIAGNOSIS — Z23 Encounter for immunization: Secondary | ICD-10-CM | POA: Diagnosis not present

## 2016-01-21 DIAGNOSIS — M329 Systemic lupus erythematosus, unspecified: Secondary | ICD-10-CM | POA: Diagnosis not present

## 2016-01-21 DIAGNOSIS — E041 Nontoxic single thyroid nodule: Secondary | ICD-10-CM | POA: Diagnosis not present

## 2016-01-21 DIAGNOSIS — G5603 Carpal tunnel syndrome, bilateral upper limbs: Secondary | ICD-10-CM | POA: Diagnosis not present

## 2016-01-21 DIAGNOSIS — Z0000 Encounter for general adult medical examination without abnormal findings: Secondary | ICD-10-CM | POA: Diagnosis not present

## 2016-01-21 DIAGNOSIS — Z8632 Personal history of gestational diabetes: Secondary | ICD-10-CM | POA: Diagnosis not present

## 2016-01-21 DIAGNOSIS — R5383 Other fatigue: Secondary | ICD-10-CM | POA: Diagnosis not present

## 2016-01-21 DIAGNOSIS — R5382 Chronic fatigue, unspecified: Secondary | ICD-10-CM | POA: Insufficient documentation

## 2016-01-21 DIAGNOSIS — E781 Pure hyperglyceridemia: Secondary | ICD-10-CM | POA: Diagnosis not present

## 2016-01-21 LAB — COMPLETE METABOLIC PANEL WITHOUT GFR
ALT: 40 U/L — ABNORMAL HIGH (ref 6–29)
AST: 28 U/L (ref 10–30)
Albumin: 4.2 g/dL (ref 3.6–5.1)
Alkaline Phosphatase: 90 U/L (ref 33–115)
BUN: 9 mg/dL (ref 7–25)
CO2: 23 mmol/L (ref 20–31)
Calcium: 9.4 mg/dL (ref 8.6–10.2)
Chloride: 102 mmol/L (ref 98–110)
Creat: 0.75 mg/dL (ref 0.50–1.10)
GFR, Est African American: >89 mL/min
GFR, Est Non African American: >89 mL/min
Glucose, Bld: 97 mg/dL (ref 65–99)
Potassium: 4.3 mmol/L (ref 3.5–5.3)
Sodium: 138 mmol/L (ref 135–146)
Total Bilirubin: 0.2 mg/dL (ref 0.2–1.2)
Total Protein: 6.8 g/dL (ref 6.1–8.1)

## 2016-01-21 LAB — CBC WITH DIFFERENTIAL/PLATELET
Basophils Absolute: 0 {cells}/uL (ref 0–200)
Basophils Relative: 0 %
Eosinophils Absolute: 234 {cells}/uL (ref 15–500)
Eosinophils Relative: 3 %
HCT: 30.6 % — ABNORMAL LOW (ref 35.0–45.0)
Hemoglobin: 10 g/dL — ABNORMAL LOW (ref 11.7–15.5)
Lymphocytes Relative: 23 %
Lymphs Abs: 1794 {cells}/uL (ref 850–3900)
MCH: 26.4 pg — ABNORMAL LOW (ref 27.0–33.0)
MCHC: 32.7 g/dL (ref 32.0–36.0)
MCV: 80.7 fL (ref 80.0–100.0)
MPV: 9.9 fL (ref 7.5–12.5)
Monocytes Absolute: 468 {cells}/uL (ref 200–950)
Monocytes Relative: 6 %
Neutro Abs: 5304 {cells}/uL (ref 1500–7800)
Neutrophils Relative %: 68 %
Platelets: 277 10*3/uL (ref 140–400)
RBC: 3.79 MIL/uL — ABNORMAL LOW (ref 3.80–5.10)
RDW: 14.8 % (ref 11.0–15.0)
WBC: 7.8 10*3/uL (ref 3.8–10.8)

## 2016-01-21 LAB — LIPID PANEL
Cholesterol: 166 mg/dL (ref 125–200)
HDL: 34 mg/dL — ABNORMAL LOW
Total CHOL/HDL Ratio: 4.9 ratio
Triglycerides: 499 mg/dL — ABNORMAL HIGH

## 2016-01-21 LAB — TSH: TSH: 2.3 m[IU]/L

## 2016-01-21 LAB — T4, FREE: Free T4: 1.2 ng/dL (ref 0.8–1.8)

## 2016-01-21 NOTE — Assessment & Plan Note (Signed)
Order thyroid US and check labs

## 2016-01-21 NOTE — Assessment & Plan Note (Signed)
Check A1c. 

## 2016-01-21 NOTE — Assessment & Plan Note (Signed)
Check labs 

## 2016-01-21 NOTE — Assessment & Plan Note (Signed)
screening

## 2016-01-21 NOTE — Assessment & Plan Note (Signed)
Order thyroid US and labs

## 2016-01-21 NOTE — Assessment & Plan Note (Addendum)
Determined by biopsy per patient; continue PPI; avoid triggers

## 2016-01-21 NOTE — Assessment & Plan Note (Signed)
Refer to rheumatologist 

## 2016-01-21 NOTE — Patient Instructions (Signed)
Check out the information at familydoctor.org entitled "Nutrition for Weight Loss: What You Need to Know about Fad Diets" Try to lose between 1-2 pounds per week by taking in fewer calories and burning off more calories You can succeed by limiting portions, limiting foods dense in calories and fat, becoming more active, and drinking 8 glasses of water a day (64 ounces) Don't skip meals, especially breakfast, as skipping meals may alter your metabolism Do not use over-the-counter weight loss pills or gimmicks that claim rapid weight loss A healthy BMI (or body mass index) is between 18.5 and 24.9 You can calculate your ideal BMI at the [place] website [employer]fr  We'll get labs today We'll have you see the rheumatologist We'll get the thyroid ultrasound If you have not heard anything from my staff in a week about any orders/referrals/studies from today, please contact us here to follow-up [phone]

## 2016-01-21 NOTE — Progress Notes (Signed)
BP 132/80   Pulse 90   Temp 98.2 F (36.8 C) (Oral)   Resp 14   Ht 5' (1.524 m)   Wt 214 lb (97.1 kg)   LMP 12/31/2015   SpO2 98%   BMI 41.79 kg/m    Subjective:    Patient ID: Donna Haas, female    DOB: Feb 02, 1976, 40 y.o.   MRN: [id number]  HPI: Donna Haas is a 40 y.o. female  Chief Complaint  Patient presents with  . Follow-up   Patient is new to me  She has lupus; stopped her plaquenil during her pregnancy; not seeing rheumatologist; last Sept was last eye exam; diagnosed with lupus around 40 years of age, symptoms since 51; she is having a lot of pain in joints and muscles; flares of rash on cheeks; since childbirth, cheeks are actually painful where the rash is  She also had gestational diabetes; when she delivered in May, weighed 190 pounds, gained 24 pounds; more active now, has a 10 years; tried breast feeding only 2 days Some calories from drink, maybe one a day, gets migraines so might have a small Coke for the caffeine; likes water  Patient has had two nodules removed from her thyroid; was diagnosed with hypothyroidism, then when put on the medicine, numbers went crazy, then dipped and went up; now has two more nodules that they found last summer  Insomnia; has had sleep studies; mother stops breathing; no personal sleep apnea  Arms; had some carpal tunnel tunnel, right side only; hurts really bad; has to sleep sitting up; right-handed; using brace  She sees Dr. Toy Care for anxiety in [place]; meds Rxd by Dr. Toy Care  She takes esomeprazole, diagnosed with Barrett's years ago  Depression screen [facility] 2/9 01/21/2016 06/25/2015 02/14/2015 11/14/2014  Decreased Interest 0 0 0 0  Down, Depressed, Hopeless 0 0 0 0  PHQ - 2 Score 0 0 0 0   Relevant past medical, surgical, family and social history reviewed Past Medical History:  Diagnosis Date  . Anxiety   . Back pain   . Endometriosis   . GERD (gastroesophageal reflux disease)   . Gestational diabetes   .  Lumbar herniated disc   . Lupus   . Migraines   . Morbid obesity ([place]) 01/25/2016  . Nephrolithiasis   . Obesity   . Pancreatitis   . Plantar fasciitis   . Thyroid cyst    Past Surgical History:  Procedure Laterality Date  . APPENDECTOMY    . CESAREAN SECTION    . CESAREAN SECTION WITH BILATERAL TUBAL LIGATION Bilateral 08/30/2015   Procedure: CESAREAN SECTION WITH BILATERAL TUBAL LIGATION;  Surgeon: Honor Loh Ward, MD;  Location: [facility] ORS;  Service: Obstetrics;  Laterality: Bilateral;  . CHOLECYSTECTOMY    . LAPAROSCOPY     x3  . THYROID CYST EXCISION     Dr. Trena Platt, [facility]   Family History  Problem Relation Age of Onset  . Depression Mother     Bi-polar  . Asthma Mother   . Allergies Mother   . Hyperlipidemia    . Hypertension    . Allergies Sister   . Diabetes Sister   . Rheum arthritis Maternal Grandmother   . Heart disease     Social History  Substance Use Topics  . Smoking status: Never Smoker  . Smokeless tobacco: Never Used  . Alcohol use No     Comment: discontinued use during pregnancy    Interim medical history since last  visit reviewed. Allergies and medications reviewed  Review of Systems Per HPI unless specifically indicated above     Objective:    BP 132/80   Pulse 90   Temp 98.2 F (36.8 C) (Oral)   Resp 14   Ht 5' (1.524 m)   Wt 214 lb (97.1 kg)   LMP 12/31/2015   SpO2 98%   BMI 41.79 kg/m   Wt Readings from Last 3 Encounters:  01/21/16 214 lb (97.1 kg)  08/30/15 208 lb (94.3 kg)  08/29/15 208 lb (94.3 kg)    Physical Exam  Constitutional: She appears well-developed and well-nourished. No distress.  Morbidly obese  HENT:  Head: Normocephalic and atraumatic.  Eyes: EOM are normal. No scleral icterus.  Neck: No thyromegaly present.  Cardiovascular: Normal rate, regular rhythm and normal heart sounds.   No murmur heard. Pulmonary/Chest: Effort normal and breath sounds normal. No respiratory distress. She has no wheezes.    Abdominal: Soft. Bowel sounds are normal. She exhibits no distension.  Musculoskeletal: Normal range of motion. She exhibits no edema.  Neurological: She is alert. She exhibits normal muscle tone.  Positive Phalen's test bilaterally; no muscle atrophy of hands  Skin: Skin is warm and dry. No rash noted. She is not diaphoretic. No pallor.  No visible butterfly rash  Psychiatric: She has a normal mood and affect. Her behavior is normal. Judgment and thought content normal. Her mood appears not anxious. She does not exhibit a depressed mood.      Assessment & Plan:   Problem List Items Addressed This Visit      Digestive   Barrett's esophagus determined by biopsy    Determined by biopsy per patient; continue PPI; avoid triggers        Endocrine   THYROID NODULE    Order thyroid US and check labs      Relevant Orders   US THYROID   TSH (Completed)   T4, free (Completed)     Nervous and Auditory   Carpal tunnel syndrome, bilateral    See AVS; continue bracing      Relevant Medications   ALPRAZolam (XANAX XR) 1 MG 24 hr tablet   escitalopram (LEXAPRO) 10 MG tablet     Other   Preventative health care    screening      Relevant Orders   COMPLETE METABOLIC PANEL WITH GFR (Completed)   Lipid panel (Completed)   Morbid obesity ([place])    Encouraged weight loss, see AVS      Lupus erythematosus    Refer to rheumatologist      Relevant Orders   Ambulatory referral to Rheumatology   Hx gestational diabetes    Check A1c      Relevant Orders   Hemoglobin A1c (Completed)   Fatigue    Check labs      Anemia   Relevant Orders   CBC with Differential/Platelet (Completed)   Abnormal weight gain - Primary    Order thyroid US and labs      Relevant Orders   US THYROID   TSH (Completed)   T4, free (Completed)    Other Visit Diagnoses    Needs flu shot       Relevant Orders   Flu Vaccine QUAD 36+ mos PF IM (Fluarix & Fluzone Quad PF) (Completed)      Follow  up plan: Return in about 3 weeks (around 02/11/2016) for follow-up, multiple things; end of the day.  An after-visit summary was printed and  given to the patient at [place].  Please see the patient instructions which may contain other information and recommendations beyond what is mentioned above in the assessment and plan.  Meds ordered this encounter  Medications  . ALPRAZolam (XANAX XR) 1 MG 24 hr tablet    Sig: Take by mouth.  . escitalopram (LEXAPRO) 10 MG tablet    Sig: Take 10 mg by mouth daily.  meds Rxd by psychiatrist  Orders Placed This Encounter  Procedures  . US THYROID  . Flu Vaccine QUAD 36+ mos PF IM (Fluarix & Fluzone Quad PF)  . TSH  . T4, free  . Hemoglobin A1c  . COMPLETE METABOLIC PANEL WITH GFR  . CBC with Differential/Platelet  . Lipid panel  . Ambulatory referral to Rheumatology

## 2016-01-21 NOTE — Assessment & Plan Note (Signed)
See AVS; continue bracing

## 2016-01-22 LAB — HEMOGLOBIN A1C
Hgb A1c MFr Bld: 6.1 % — ABNORMAL HIGH
Mean Plasma Glucose: 128 mg/dL

## 2016-01-25 ENCOUNTER — Encounter: Payer: Self-pay | Admitting: Family Medicine

## 2016-01-25 HISTORY — DX: Morbid (severe) obesity due to excess calories: E66.01

## 2016-01-25 NOTE — Assessment & Plan Note (Signed)
Encouraged weight loss, see AVS 

## 2016-01-30 ENCOUNTER — Emergency Department
Admission: EM | Admit: 2016-01-30 | Discharge: 2016-01-30 | Disposition: A | Discharge status: Home / Self Care | Payer: Medicare Other | Attending: Emergency Medicine | Admitting: Emergency Medicine

## 2016-01-30 ENCOUNTER — Encounter: Payer: Self-pay | Admitting: Emergency Medicine

## 2016-01-30 ENCOUNTER — Emergency Department: Payer: Medicare Other

## 2016-01-30 DIAGNOSIS — Z79899 Other long term (current) drug therapy: Secondary | ICD-10-CM | POA: Diagnosis not present

## 2016-01-30 DIAGNOSIS — S92354A Nondisplaced fracture of fifth metatarsal bone, right foot, initial encounter for closed fracture: Secondary | ICD-10-CM | POA: Diagnosis not present

## 2016-01-30 DIAGNOSIS — Y9301 Activity, walking, marching and hiking: Secondary | ICD-10-CM | POA: Insufficient documentation

## 2016-01-30 DIAGNOSIS — W1839XA Other fall on same level, initial encounter: Secondary | ICD-10-CM | POA: Diagnosis not present

## 2016-01-30 DIAGNOSIS — Y92009 Unspecified place in unspecified non-institutional (private) residence as the place of occurrence of the external cause: Secondary | ICD-10-CM | POA: Diagnosis not present

## 2016-01-30 DIAGNOSIS — Y999 Unspecified external cause status: Secondary | ICD-10-CM | POA: Diagnosis not present

## 2016-01-30 DIAGNOSIS — M7989 Other specified soft tissue disorders: Secondary | ICD-10-CM | POA: Diagnosis not present

## 2016-01-30 DIAGNOSIS — S99911A Unspecified injury of right ankle, initial encounter: Secondary | ICD-10-CM | POA: Diagnosis present

## 2016-01-30 DIAGNOSIS — M25571 Pain in right ankle and joints of right foot: Secondary | ICD-10-CM | POA: Diagnosis not present

## 2016-01-30 MED ORDER — MELOXICAM 15 MG PO TABS: 15.0000 mg | ORAL_TABLET | Freq: Every day | ORAL | 0 refills | Status: DC

## 2016-01-30 MED ORDER — MELOXICAM 7.5 MG PO TABS
15.0000 mg | ORAL_TABLET | Freq: Once | ORAL | Status: AC
  Administered 2016-01-30: 15 mg via ORAL
  Filled 2016-01-30: qty 2

## 2016-01-30 MED ORDER — MELOXICAM 7.5 MG PO TABS
ORAL_TABLET | ORAL | Status: AC
  Administered 2016-01-30: 15 mg via ORAL
  Filled 2016-01-30: qty 1

## 2016-01-30 MED ORDER — OXYCODONE-ACETAMINOPHEN 10-325 MG PO TABS: 1.0000 | ORAL_TABLET | Freq: Four times a day (QID) | ORAL | 0 refills | Status: AC | PRN

## 2016-01-30 NOTE — ED Triage Notes (Signed)
Pt to triage via w/c with no distress noted; pt reports falling while walking and injuring right ankle at 9pm

## 2016-01-30 NOTE — ED Provider Notes (Signed)
[facility] Emergency Department Provider Note  ____________________________________________  Time seen: Approximately 10:44 PM  I have reviewed the triage vital signs and the nursing notes.   HISTORY  Chief Complaint Ankle Pain    HPI Donna Haas is a 40 y.o. female who presents emergency department complaining of lateral right foot pain. Patient states that she is at home when she turned in her ankle "rolled up underneath her." Patient states that she has had a history of fractures to her feet before and states that the pain is similar. Patient reports the pain is to the lateral aspect of the foot. She did not fall and hit her head or lose consciousness. No other complaint at this time. Patient did take ibuprofen earlier in the day for headache but is not taking any medication for this complaint prior to arrival.   Past Medical History:  Diagnosis Date  . Anxiety   . Back pain   . Endometriosis   . GERD (gastroesophageal reflux disease)   . Gestational diabetes   . Lumbar herniated disc   . Lupus   . Migraines   . Morbid obesity ([place]) 01/25/2016  . Nephrolithiasis   . Obesity   . Pancreatitis   . Plantar fasciitis   . Thyroid cyst     Patient Active Problem List   Diagnosis Date Noted  . Morbid obesity ([place]) 01/25/2016  . Abnormal weight gain 01/21/2016  . Barrett's esophagus determined by biopsy 01/21/2016  . Anemia 01/21/2016  . Fatigue 01/21/2016  . Carpal tunnel syndrome, bilateral 01/21/2016  . Preventative health care 01/21/2016  . Hx gestational diabetes 01/21/2016  . Cesarean delivery delivered 08/31/2015  . Headache 09/08/2012  . Neck pain 09/08/2012  . MOLE 02/22/2009  . INSOMNIA 09/21/2008  . FOOT SPRAIN 09/21/2008  . PANCREATITIS, HX OF 04/17/2008  . NEPHROLITHIASIS, HX OF 04/17/2008  . ANXIETY 02/16/2008  . THYROID NODULE 01/12/2008  . PARESTHESIA 12/14/2007  . OBSTRUCTIVE SLEEP APNEA 10/03/2007  . THYROID CYST  05/30/2007  . GERD 05/30/2007  . ENDOMETRIOSIS OF OTHER SPECIFIED SITES 05/30/2007  . Lupus erythematosus 05/30/2007  . HERNIATED LUMBAR [place] 05/30/2007  . BACK PAIN 05/30/2007  . PLANTAR FASCIITIS, BILATERAL 05/30/2007  . SNORING, HX OF 05/30/2007    Past Surgical History:  Procedure Laterality Date  . APPENDECTOMY    . CESAREAN SECTION    . CESAREAN SECTION WITH BILATERAL TUBAL LIGATION Bilateral 08/30/2015   Procedure: CESAREAN SECTION WITH BILATERAL TUBAL LIGATION;  Surgeon: Honor Loh Ward, MD;  Location: [facility] ORS;  Service: Obstetrics;  Laterality: Bilateral;  . CHOLECYSTECTOMY    . LAPAROSCOPY     x3  . THYROID CYST EXCISION     Dr. Trena Platt, [place]    Prior to Admission medications   Medication Sig Start Date End Date Taking? Authorizing Provider  ALPRAZolam (XANAX XR) 1 MG 24 hr tablet Take by mouth.    Historical Provider, MD  clonazePAM (KLONOPIN) 1 MG tablet Take 1 mg by mouth 2 (two) times daily as needed. Two tablets at bedtime    Historical Provider, MD  escitalopram (LEXAPRO) 10 MG tablet Take 10 mg by mouth daily. 12/30/15   Historical Provider, MD  esomeprazole (NEXIUM) 40 MG capsule Take 20 mg by mouth daily.     Historical Provider, MD  loratadine (CLARITIN) 10 MG tablet Take 10 mg by mouth daily.    Historical Provider, MD  meloxicam (MOBIC) 15 MG tablet Take 1 tablet (15 mg total) by mouth daily.  01/30/16   Charline Bills Cuthriell, PA-C  oxyCODONE-acetaminophen (PERCOCET) 10-325 MG tablet Take 1 tablet by mouth every 6 (six) hours as needed for pain. 01/30/16 01/29/17  Roderic Palau D Cuthriell, PA-C  zolpidem (AMBIEN) 5 MG tablet Take 1 tablet (5 mg total) by mouth at bedtime as needed for sleep (insomnia). 09/02/15   Burlene Arnt, CNM    Allergies Prednisone; Prochlorperazine edisylate; Hydrocodone-acetaminophen; and Morphine and related  Family History  Problem Relation Age of Onset  . Depression Mother     Bi-polar  . Asthma Mother   . Allergies Mother    . Hyperlipidemia    . Hypertension    . Heart disease    . Allergies Sister   . Diabetes Sister   . Rheum arthritis Maternal Grandmother     Social History Social History  Substance Use Topics  . Smoking status: Never Smoker  . Smokeless tobacco: Never Used  . Alcohol use No     Comment: discontinued use during pregnancy     Review of Systems  Constitutional: No fever/chills Cardiovascular: no chest pain. Respiratory: no cough. No SOB. Musculoskeletal: Positive for right foot pain Skin: Negative for rash, abrasions, lacerations, ecchymosis. Neurological: Negative for headaches, focal weakness or numbness. 10-point ROS otherwise negative.  ____________________________________________   PHYSICAL EXAM:  VITAL SIGNS: ED Triage Vitals  Enc Vitals Group     BP 01/30/16 2145 131/85     Pulse Rate 01/30/16 2145 82     Resp 01/30/16 2145 18     Temp 01/30/16 2145 98 F (36.7 C)     Temp Source 01/30/16 2145 Oral     SpO2 01/30/16 2145 99 %     Weight 01/30/16 2141 215 lb (97.5 kg)     Height 01/30/16 2141 5' (1.524 m)     Head Circumference --      Peak Flow --      Pain Score 01/30/16 2141 7     Pain Loc --      Pain Edu? --      Excl. in [place]? --      Constitutional: Alert and oriented. Well appearing and in no acute distress. Eyes: Conjunctivae are normal. PERRL. EOMI. Head: Atraumatic. Cardiovascular: Normal rate, regular rhythm. Normal S1 and S2.  Good peripheral circulation. Respiratory: Normal respiratory effort without tachypnea or retractions. Lungs CTAB. Good air entry to the bases with no decreased or absent breath sounds. Musculoskeletal: No gross deformity or edema noted to right foot upon inspection. Patient is very tender to palpation over the base of fifth metatarsal. No palpable abnormality. Patient has full range of motion 5 digits. Sensation and cap refill intact 5 digits. Neurologic:  Normal speech and language. No gross focal neurologic  deficits are appreciated.  Skin:  Skin is warm, dry and intact. No rash noted. Psychiatric: Mood and affect are normal. Speech and behavior are normal. Patient exhibits appropriate insight and judgement.   ____________________________________________   LABS (all labs ordered are listed, but only abnormal results are displayed)  Labs Reviewed - No data to display ____________________________________________  EKG   ____________________________________________  RADIOLOGY Diamantina Providence Cuthriell, personally viewed and evaluated these images (plain radiographs) as part of my medical decision making, as well as reviewing the written report by the radiologist. Radiologist reads x-ray with no acute osseous abnormality. Upon my examination, there appears to be small fracture at the base of the fifth metatarsal. There is cortical disruption over the lateral aspect of the base of the  fifth metatarsal. This runs horizontally but does not appear to completely transect the bone. No cortical disruption noted on the medial aspect of the fifth metatarsal.  Dg Ankle Complete Right  Result Date: 01/30/2016 CLINICAL DATA:  Acute onset of right lateral ankle pain and swelling, status post fall. Initial encounter. EXAM: RIGHT ANKLE - COMPLETE 3+ VIEW COMPARISON:  None. FINDINGS: There is no evidence of fracture or dislocation. The ankle mortise is intact; the interosseous space is within normal limits. No talar tilt or subluxation is seen. The joint spaces are preserved. No significant soft tissue abnormalities are seen. IMPRESSION: No evidence of fracture or dislocation. Electronically Signed   By: Garald Balding M.D.   On: 01/30/2016 22:46    ____________________________________________    PROCEDURES  Procedure(s) performed:    Procedures    Medications  meloxicam (MOBIC) tablet 15 mg (not administered)  meloxicam (MOBIC) 7.5 MG tablet (not administered)      ____________________________________________   INITIAL IMPRESSION / ASSESSMENT AND PLAN / ED COURSE  Pertinent labs & imaging results that were available during my care of the patient were reviewed by me and considered in my medical decision making (see chart for details).  Review of the [place] CSRS was performed in accordance of the [place] prior to dispensing any controlled drugs.  Clinical Course    Patient's diagnosis is consistent with Fracture of the fifth metatarsal. Upon my review of x-ray, there appears to be cortical disruption on the lateral aspect of the base of the fifth metatarsal that runs horizontally across the bone. This does not appear to completely transect the bone. Patient will be given postop shoe and crutches in the emergency department.. Patient will be discharged home with prescriptions for anti-inflammatories and very limited pain medication. Patient is to follow up with podiatry as needed or otherwise directed. Patient is given ED precautions to return to the ED for any worsening or new symptoms.     ____________________________________________  FINAL CLINICAL IMPRESSION(S) / ED DIAGNOSES  Final diagnoses:  Closed nondisplaced fracture of fifth metatarsal bone of right foot, initial encounter      NEW MEDICATIONS STARTED DURING THIS VISIT:  New Prescriptions   MELOXICAM (MOBIC) 15 MG TABLET    Take 1 tablet (15 mg total) by mouth daily.   OXYCODONE-ACETAMINOPHEN (PERCOCET) 10-325 MG TABLET    Take 1 tablet by mouth every 6 (six) hours as needed for pain.        This chart was dictated using voice recognition software/Dragon. Despite best efforts to proofread, errors can occur which can change the meaning. Any change was purely unintentional.    Darletta Moll, PA-C 01/30/16 2313    Eula Listen, MD 01/31/16 0001

## 2016-01-30 NOTE — ED Notes (Signed)

## 2016-02-06 ENCOUNTER — Ambulatory Visit: Payer: Medicare Other

## 2016-02-06 DIAGNOSIS — S92354A Nondisplaced fracture of fifth metatarsal bone, right foot, initial encounter for closed fracture: Secondary | ICD-10-CM | POA: Diagnosis not present

## 2016-02-07 ENCOUNTER — Telehealth: Payer: Self-pay | Admitting: Family Medicine

## 2016-02-07 DIAGNOSIS — D649 Anemia, unspecified: Secondary | ICD-10-CM

## 2016-02-07 DIAGNOSIS — E781 Pure hyperglyceridemia: Secondary | ICD-10-CM | POA: Insufficient documentation

## 2016-02-07 NOTE — Assessment & Plan Note (Signed)
Patient wants to try OTC fish oil or krill oil; recheck labs in 3 months

## 2016-02-07 NOTE — Telephone Encounter (Signed)
I called pt and went over results Total chol down overall, but TG up She doesn't eat unhealthy She'll try OTC fish oil or krill oil BID, limit starches Do eat diet rich in iron, cook on cast iron a few times a week (cornbread, pancakes, etc); she doesn't tolerate iron pills Recheck labs in 3 months

## 2016-02-07 NOTE — Assessment & Plan Note (Signed)
Check labs after iron-rich diet in 3 months; H/H did improve

## 2016-02-10 ENCOUNTER — Telehealth: Payer: Self-pay | Admitting: Family Medicine

## 2016-02-10 NOTE — Telephone Encounter (Signed)
Please keep appt; my last note said come back in 3 weeks, not 3 months

## 2016-02-10 NOTE — Telephone Encounter (Signed)
Patient spoke with you on this weekend and you told her that she was to return in 3 months. Patient has appt for tomorrow and would like to know if you would like for her to cancel that appt.

## 2016-02-10 NOTE — Telephone Encounter (Signed)
Pt verbally informed. °

## 2016-02-11 ENCOUNTER — Ambulatory Visit: Payer: Medicaid Other | Admitting: Family Medicine

## 2016-02-27 DIAGNOSIS — M79671 Pain in right foot: Secondary | ICD-10-CM | POA: Diagnosis not present

## 2016-02-27 DIAGNOSIS — S92354A Nondisplaced fracture of fifth metatarsal bone, right foot, initial encounter for closed fracture: Secondary | ICD-10-CM | POA: Diagnosis not present

## 2016-03-14 ENCOUNTER — Telehealth: Payer: Self-pay | Admitting: Family Medicine

## 2016-03-14 NOTE — Telephone Encounter (Signed)
Please follow-up with patient regarding the thyroid ultrasound ordered in October; thank you

## 2016-03-16 NOTE — Telephone Encounter (Signed)
Per the request of Dr. Enid Derry, I contacted this patient to find out why the ultrasound for her thyroid has not been performed, but the home number [phone] listed in file was incorrect. I was able to get through and leave a message on the other number stating that we were concerned about her not having the ultrasound and for her to give [place] a call when she got the chance.

## 2016-03-19 NOTE — Telephone Encounter (Signed)
Per the request of Dr. Enid Derry, I tried to contact this patient again to see if we could help in getting her scheduled for the thyroid ultrasound but there was no answer.  A message was left for her to give [place] a call when she got the chance.  A letter will be sent out today.

## 2016-03-24 DIAGNOSIS — S92354A Nondisplaced fracture of fifth metatarsal bone, right foot, initial encounter for closed fracture: Secondary | ICD-10-CM | POA: Diagnosis not present

## 2016-04-27 DIAGNOSIS — S92354A Nondisplaced fracture of fifth metatarsal bone, right foot, initial encounter for closed fracture: Secondary | ICD-10-CM | POA: Diagnosis not present

## 2016-12-02 ENCOUNTER — Encounter: Payer: Self-pay | Admitting: Family Medicine

## 2016-12-02 NOTE — Progress Notes (Signed)
Patient never returned for follow-up Never got labs done Canceling the labs

## 2017-01-14 DIAGNOSIS — Z23 Encounter for immunization: Secondary | ICD-10-CM | POA: Diagnosis not present

## 2017-03-03 ENCOUNTER — Ambulatory Visit: Payer: Medicare Other | Admitting: Family Medicine

## 2017-03-03 DIAGNOSIS — E7849 Other hyperlipidemia: Secondary | ICD-10-CM | POA: Diagnosis not present

## 2017-03-03 DIAGNOSIS — M25511 Pain in right shoulder: Secondary | ICD-10-CM | POA: Diagnosis not present

## 2017-03-03 DIAGNOSIS — Z716 Tobacco abuse counseling: Secondary | ICD-10-CM | POA: Diagnosis not present

## 2017-03-03 DIAGNOSIS — Z0000 Encounter for general adult medical examination without abnormal findings: Secondary | ICD-10-CM | POA: Diagnosis not present

## 2017-03-03 DIAGNOSIS — E7800 Pure hypercholesterolemia, unspecified: Secondary | ICD-10-CM | POA: Diagnosis not present

## 2017-03-03 DIAGNOSIS — Z79899 Other long term (current) drug therapy: Secondary | ICD-10-CM | POA: Diagnosis not present

## 2017-03-03 DIAGNOSIS — F419 Anxiety disorder, unspecified: Secondary | ICD-10-CM | POA: Diagnosis not present

## 2017-03-03 DIAGNOSIS — J449 Chronic obstructive pulmonary disease, unspecified: Secondary | ICD-10-CM | POA: Diagnosis not present

## 2017-03-03 DIAGNOSIS — G4700 Insomnia, unspecified: Secondary | ICD-10-CM | POA: Diagnosis not present

## 2017-03-03 DIAGNOSIS — M2550 Pain in unspecified joint: Secondary | ICD-10-CM | POA: Diagnosis not present

## 2017-03-03 DIAGNOSIS — E669 Obesity, unspecified: Secondary | ICD-10-CM | POA: Diagnosis not present

## 2017-03-03 DIAGNOSIS — E559 Vitamin D deficiency, unspecified: Secondary | ICD-10-CM | POA: Diagnosis not present

## 2017-03-03 DIAGNOSIS — F329 Major depressive disorder, single episode, unspecified: Secondary | ICD-10-CM | POA: Diagnosis not present

## 2017-03-03 DIAGNOSIS — L930 Discoid lupus erythematosus: Secondary | ICD-10-CM | POA: Diagnosis not present

## 2017-03-03 DIAGNOSIS — R5383 Other fatigue: Secondary | ICD-10-CM | POA: Diagnosis not present

## 2017-03-03 DIAGNOSIS — Z1389 Encounter for screening for other disorder: Secondary | ICD-10-CM | POA: Diagnosis not present

## 2017-03-11 DIAGNOSIS — L0390 Cellulitis, unspecified: Secondary | ICD-10-CM | POA: Diagnosis not present

## 2017-03-11 DIAGNOSIS — L700 Acne vulgaris: Secondary | ICD-10-CM | POA: Diagnosis not present

## 2017-03-22 ENCOUNTER — Encounter: Payer: Self-pay | Admitting: Emergency Medicine

## 2017-03-22 ENCOUNTER — Emergency Department: Payer: Medicare Other

## 2017-03-22 ENCOUNTER — Emergency Department
Admission: EM | Admit: 2017-03-22 | Discharge: 2017-03-22 | Disposition: A | Discharge status: Home / Self Care | Payer: Medicare Other | Attending: Emergency Medicine | Admitting: Emergency Medicine

## 2017-03-22 DIAGNOSIS — R079 Chest pain, unspecified: Secondary | ICD-10-CM | POA: Diagnosis not present

## 2017-03-22 DIAGNOSIS — R0602 Shortness of breath: Secondary | ICD-10-CM | POA: Diagnosis not present

## 2017-03-22 DIAGNOSIS — D649 Anemia, unspecified: Secondary | ICD-10-CM | POA: Diagnosis not present

## 2017-03-22 DIAGNOSIS — D509 Iron deficiency anemia, unspecified: Secondary | ICD-10-CM | POA: Diagnosis not present

## 2017-03-22 LAB — BASIC METABOLIC PANEL WITH GFR
Anion gap: 9 (ref 5–15)
BUN: 10 mg/dL (ref 6–20)
CO2: 24 mmol/L (ref 22–32)
Calcium: 9 mg/dL (ref 8.9–10.3)
Chloride: 105 mmol/L (ref 101–111)
Creatinine, Ser: 0.77 mg/dL (ref 0.44–1.00)
GFR calc Af Amer: >60 mL/min
GFR calc non Af Amer: >60 mL/min
Glucose, Bld: 122 mg/dL — ABNORMAL HIGH (ref 65–99)
Potassium: 4.1 mmol/L (ref 3.5–5.1)
Sodium: 138 mmol/L (ref 135–145)

## 2017-03-22 LAB — FIBRIN DERIVATIVES D-DIMER (ARMC ONLY): Fibrin derivatives D-dimer (ARMC): 281.85 ng{FEU}/mL (ref 0.00–499.00)

## 2017-03-22 LAB — CBC
HCT: 28.4 % — ABNORMAL LOW (ref 35.0–47.0)
Hemoglobin: 8.9 g/dL — ABNORMAL LOW (ref 12.0–16.0)
MCH: 21.3 pg — ABNORMAL LOW (ref 26.0–34.0)
MCHC: 31.4 g/dL — ABNORMAL LOW (ref 32.0–36.0)
MCV: 67.7 fL — ABNORMAL LOW (ref 80.0–100.0)
Platelets: 289 10*3/uL (ref 150–440)
RBC: 4.19 MIL/uL (ref 3.80–5.20)
RDW: 17.5 % — ABNORMAL HIGH (ref 11.5–14.5)
WBC: 6.4 10*3/uL (ref 3.6–11.0)

## 2017-03-22 LAB — TROPONIN I
Troponin I: <0.03 ng/mL
Troponin I: <0.03 ng/mL

## 2017-03-22 NOTE — ED Provider Notes (Signed)
[facility] Emergency Department Provider Note       Time seen: ----------------------------------------- 9:54 PM on 03/22/2017 -----------------------------------------   I have reviewed the triage vital signs and the nursing notes.  HISTORY   Chief Complaint Chest Pain    HPI Donna Haas is a 41 y.o. female with a history of anxiety, lupus, migraines who presents to the ED for sudden onset of mid chest pain accompanied by shortness of breath that radiated into the left arm.  She has some associated left arm numbness.  Currently she is having 4 out of 10 chest pain but the pain is intermittent and sharp at times.  He does have a history of lupus and states she has had a recent flareup but is not sure if the pain is related.  She also has a recent change in her medicines and has a prescription pending for iron.  Past Medical History:  Diagnosis Date  . Anxiety   . Back pain   . Endometriosis   . GERD (gastroesophageal reflux disease)   . Gestational diabetes   . Lumbar herniated disc   . Lupus   . Migraines   . Morbid obesity ([place]) 01/25/2016  . Nephrolithiasis   . Obesity   . Pancreatitis   . Plantar fasciitis   . Thyroid cyst     Patient Active Problem List   Diagnosis Date Noted  . Hypertriglyceridemia 02/07/2016  . Morbid obesity ([place]) 01/25/2016  . Abnormal weight gain 01/21/2016  . Barrett's esophagus determined by biopsy 01/21/2016  . Anemia 01/21/2016  . Fatigue 01/21/2016  . Carpal tunnel syndrome, bilateral 01/21/2016  . Preventative health care 01/21/2016  . Hx gestational diabetes 01/21/2016  . Cesarean delivery delivered 08/31/2015  . Headache 09/08/2012  . Neck pain 09/08/2012  . MOLE 02/22/2009  . INSOMNIA 09/21/2008  . FOOT SPRAIN 09/21/2008  . PANCREATITIS, HX OF 04/17/2008  . NEPHROLITHIASIS, HX OF 04/17/2008  . ANXIETY 02/16/2008  . THYROID NODULE 01/12/2008  . PARESTHESIA 12/14/2007  . OBSTRUCTIVE SLEEP APNEA  10/03/2007  . THYROID CYST 05/30/2007  . GERD 05/30/2007  . ENDOMETRIOSIS OF OTHER SPECIFIED SITES 05/30/2007  . Lupus erythematosus 05/30/2007  . HERNIATED LUMBAR [place] 05/30/2007  . BACK PAIN 05/30/2007  . PLANTAR FASCIITIS, BILATERAL 05/30/2007  . SNORING, HX OF 05/30/2007    Past Surgical History:  Procedure Laterality Date  . APPENDECTOMY    . CESAREAN SECTION    . CESAREAN SECTION WITH BILATERAL TUBAL LIGATION Bilateral 08/30/2015   Procedure: CESAREAN SECTION WITH BILATERAL TUBAL LIGATION;  Surgeon: Honor Loh Ward, MD;  Location: [facility] ORS;  Service: Obstetrics;  Laterality: Bilateral;  . CHOLECYSTECTOMY    . LAPAROSCOPY     x3  . THYROID CYST EXCISION     Dr. Trena Platt, [place]    Allergies Prednisone; Prochlorperazine edisylate; Hydrocodone-acetaminophen; and Morphine and related  Social History Social History   Tobacco Use  . Smoking status: Never Smoker  . Smokeless tobacco: Never Used  Substance Use Topics  . Alcohol use: No    Alcohol/week: 0.6 - 1.2 oz    Types: 1 - 2 Standard drinks or equivalent per week    Comment: discontinued use during pregnancy  . Drug use: No    Review of Systems Constitutional: Negative for fever. Eyes: Negative for vision changes ENT:  Negative for congestion, sore throat Cardiovascular: Positive for chest pain Respiratory: Positive for shortness of breath Gastrointestinal: Negative for abdominal pain, vomiting and diarrhea. Genitourinary: Negative for dysuria. Musculoskeletal:  Positive for left arm pain and numbness Skin: Negative for rash. Neurological: Positive for headache and numbness  All systems negative/normal/unremarkable except as stated in the HPI  ____________________________________________   PHYSICAL EXAM:  VITAL SIGNS: ED Triage Vitals [03/22/17 1916]  Enc Vitals Group     BP 116/68     Pulse Rate 88     Resp 17     Temp 98 F (36.7 C)     Temp Source Oral     SpO2 99 %     Weight 214 lb (97.1  kg)     Height      Head Circumference      Peak Flow      Pain Score      Pain Loc      Pain Edu?      Excl. in [place]?     Constitutional: Alert and oriented. Well appearing and in no distress. Eyes: Conjunctivae are normal. Normal extraocular movements. ENT   Head: Normocephalic and atraumatic.   Nose: No congestion/rhinnorhea.   Mouth/Throat: Mucous membranes are moist.   Neck: No stridor. Cardiovascular: Normal rate, regular rhythm. No murmurs, rubs, or gallops. Respiratory: Normal respiratory effort without tachypnea nor retractions. Breath sounds are clear and equal bilaterally. No wheezes/rales/rhonchi. Gastrointestinal: Soft and nontender. Normal bowel sounds Musculoskeletal: Nontender with normal range of motion in extremities. No lower extremity tenderness nor edema. Neurologic:  Normal speech and language. No gross focal neurologic deficits are appreciated.  Skin:  Skin is warm, dry and intact. No rash noted. Psychiatric: Mood and affect are normal. Speech and behavior are normal.  ____________________________________________  EKG: Interpreted by me.  Sinus rhythm rate 80 bpm, normal PR interval, normal QRS, normal QT.  ____________________________________________  ED COURSE:  Pertinent labs & imaging results that were available during my care of the patient were reviewed by me and considered in my medical decision making (see chart for details). Patient presents for chest pain and shortness of breath, we will assess with labs and imaging as indicated.   Procedures ____________________________________________   LABS (pertinent positives/negatives)  Labs Reviewed  BASIC METABOLIC PANEL - Abnormal; Notable for the following components:      Result Value   Glucose, Bld 122 (*)    All other components within normal limits  CBC - Abnormal; Notable for the following components:   Hemoglobin 8.9 (*)    HCT 28.4 (*)    MCV 67.7 (*)    MCH 21.3 (*)    MCHC  31.4 (*)    RDW 17.5 (*)    All other components within normal limits  TROPONIN I  TROPONIN I  FIBRIN DERIVATIVES D-DIMER ([facility] ONLY)  VITAMIN D 25 HYDROXY (VIT D DEFICIENCY, FRACTURES)  POC URINE PREG, ED    RADIOLOGY Images were viewed by me  Chest x-ray is unremarkable  ____________________________________________  DIFFERENTIAL DIAGNOSIS   Musculoskeletal pain, anemia, lupus flare, MI, PE, pneumothorax  FINAL ASSESSMENT AND PLAN  Chest pain, shortness of breath, anemia   Plan: Patient had presented for chest pain shortness of breath. Patient's labs reveal anemia of which she is aware and supposed to be taking iron. Patient's imaging was negative.  Repeat troponin and d-dimer were both negative as well.  Overall she is stable for outpatient follow-up with her doctor.   Earleen Newport, MD   Note: This note was generated in part or whole with voice recognition software. Voice recognition is usually quite accurate but there are transcription errors that can  and very often do occur. I apologize for any typographical errors that were not detected and corrected.     Earleen Newport, MD 03/22/17 [phone]

## 2017-03-22 NOTE — ED Triage Notes (Signed)
Pt arrived to triage via EMS from home after having sudden onset of left sided chest pain accompanied by SOB and pain radiating into the left arm. Pt having 4/10 chest pain now in triage but reports the pain is intermittent and sharpe at times. Pt has HX of lupus and sts she has had a recent flare up.

## 2017-03-24 LAB — VITAMIN D 25 HYDROXY (VIT D DEFICIENCY, FRACTURES): Vit D, 25-Hydroxy: 35.6 ng/mL (ref 30.0–100.0)

## 2017-05-23 DIAGNOSIS — Z20828 Contact with and (suspected) exposure to other viral communicable diseases: Secondary | ICD-10-CM | POA: Diagnosis not present

## 2017-05-23 DIAGNOSIS — B349 Viral infection, unspecified: Secondary | ICD-10-CM | POA: Diagnosis not present

## 2017-06-30 DIAGNOSIS — E785 Hyperlipidemia, unspecified: Secondary | ICD-10-CM | POA: Diagnosis not present

## 2017-06-30 DIAGNOSIS — M545 Low back pain: Secondary | ICD-10-CM | POA: Diagnosis not present

## 2017-06-30 DIAGNOSIS — L930 Discoid lupus erythematosus: Secondary | ICD-10-CM | POA: Diagnosis not present

## 2017-06-30 DIAGNOSIS — R0989 Other specified symptoms and signs involving the circulatory and respiratory systems: Secondary | ICD-10-CM | POA: Diagnosis not present

## 2017-06-30 DIAGNOSIS — G4700 Insomnia, unspecified: Secondary | ICD-10-CM | POA: Diagnosis not present

## 2017-06-30 DIAGNOSIS — E559 Vitamin D deficiency, unspecified: Secondary | ICD-10-CM | POA: Diagnosis not present

## 2017-06-30 DIAGNOSIS — E782 Mixed hyperlipidemia: Secondary | ICD-10-CM | POA: Diagnosis not present

## 2017-06-30 DIAGNOSIS — E7800 Pure hypercholesterolemia, unspecified: Secondary | ICD-10-CM | POA: Diagnosis not present

## 2017-06-30 DIAGNOSIS — Z1389 Encounter for screening for other disorder: Secondary | ICD-10-CM | POA: Diagnosis not present

## 2017-11-02 DIAGNOSIS — S60221A Contusion of right hand, initial encounter: Secondary | ICD-10-CM | POA: Diagnosis not present

## 2017-11-04 DIAGNOSIS — S62366A Nondisplaced fracture of neck of fifth metacarpal bone, right hand, initial encounter for closed fracture: Secondary | ICD-10-CM | POA: Diagnosis not present

## 2017-11-25 DIAGNOSIS — T7840XA Allergy, unspecified, initial encounter: Secondary | ICD-10-CM | POA: Diagnosis not present

## 2017-11-25 DIAGNOSIS — T7800XA Anaphylactic reaction due to unspecified food, initial encounter: Secondary | ICD-10-CM | POA: Diagnosis not present

## 2017-11-25 DIAGNOSIS — J302 Other seasonal allergic rhinitis: Secondary | ICD-10-CM | POA: Diagnosis not present

## 2017-11-25 DIAGNOSIS — J069 Acute upper respiratory infection, unspecified: Secondary | ICD-10-CM | POA: Diagnosis not present

## 2017-11-25 DIAGNOSIS — J029 Acute pharyngitis, unspecified: Secondary | ICD-10-CM | POA: Diagnosis not present

## 2017-11-25 DIAGNOSIS — E782 Mixed hyperlipidemia: Secondary | ICD-10-CM | POA: Diagnosis not present

## 2017-11-25 DIAGNOSIS — E7800 Pure hypercholesterolemia, unspecified: Secondary | ICD-10-CM | POA: Diagnosis not present

## 2017-11-25 DIAGNOSIS — Z1389 Encounter for screening for other disorder: Secondary | ICD-10-CM | POA: Diagnosis not present

## 2017-11-25 DIAGNOSIS — E559 Vitamin D deficiency, unspecified: Secondary | ICD-10-CM | POA: Diagnosis not present

## 2017-12-18 DIAGNOSIS — Z23 Encounter for immunization: Secondary | ICD-10-CM | POA: Diagnosis not present

## 2018-01-19 DIAGNOSIS — Z1231 Encounter for screening mammogram for malignant neoplasm of breast: Secondary | ICD-10-CM | POA: Diagnosis not present

## 2018-03-07 DIAGNOSIS — M5416 Radiculopathy, lumbar region: Secondary | ICD-10-CM | POA: Diagnosis not present

## 2018-03-14 DIAGNOSIS — M545 Low back pain: Secondary | ICD-10-CM | POA: Diagnosis not present

## 2018-03-14 DIAGNOSIS — M79604 Pain in right leg: Secondary | ICD-10-CM | POA: Diagnosis not present

## 2019-02-27 NOTE — Progress Notes (Deleted)
Arnetha Courser, MD   No chief complaint on file.   HPI:      [name] is a 43 y.o. [phone] who LMP was No LMP recorded., presents today for NP breast exam    Patient Active Problem List   Diagnosis Date Noted  . Hypertriglyceridemia 02/07/2016  . Morbid obesity ([place]) 01/25/2016  . Abnormal weight gain 01/21/2016  . Barrett's esophagus determined by biopsy 01/21/2016  . Anemia 01/21/2016  . Fatigue 01/21/2016  . Carpal tunnel syndrome, bilateral 01/21/2016  . Preventative health care 01/21/2016  . Hx gestational diabetes 01/21/2016  . Cesarean delivery delivered 08/31/2015  . Headache 09/08/2012  . Neck pain 09/08/2012  . MOLE 02/22/2009  . INSOMNIA 09/21/2008  . FOOT SPRAIN 09/21/2008  . PANCREATITIS, HX OF 04/17/2008  . NEPHROLITHIASIS, HX OF 04/17/2008  . ANXIETY 02/16/2008  . THYROID NODULE 01/12/2008  . PARESTHESIA 12/14/2007  . OBSTRUCTIVE SLEEP APNEA 10/03/2007  . THYROID CYST 05/30/2007  . GERD 05/30/2007  . ENDOMETRIOSIS OF OTHER SPECIFIED SITES 05/30/2007  . Lupus erythematosus 05/30/2007  . HERNIATED LUMBAR [place] 05/30/2007  . BACK PAIN 05/30/2007  . PLANTAR FASCIITIS, BILATERAL 05/30/2007  . SNORING, HX OF 05/30/2007    Past Surgical History:  Procedure Laterality Date  . APPENDECTOMY    . CESAREAN SECTION    . CESAREAN SECTION WITH BILATERAL TUBAL LIGATION Bilateral 08/30/2015   Procedure: CESAREAN SECTION WITH BILATERAL TUBAL LIGATION;  Surgeon: Honor Loh Ward, MD;  Location: [facility] ORS;  Service: Obstetrics;  Laterality: Bilateral;  . CHOLECYSTECTOMY    . LAPAROSCOPY     x3  . THYROID CYST EXCISION     Dr. Trena Platt, [facility]    Family History  Problem Relation Age of Onset  . Depression Mother        Bi-polar  . Asthma Mother   . Allergies Mother   . Hyperlipidemia Unknown   . Hypertension Unknown   . Heart disease Unknown   . Allergies Sister   . Diabetes Sister   . Rheum arthritis Maternal Grandmother     Social  History   Socioeconomic History  . Marital status: Divorced    Spouse name: Not on file  . Number of children: Not on file  . Years of education: Not on file  . Highest education level: Not on file  Occupational History  . Occupation: disability    Employer: UNEMPLOYED  Social Needs  . Financial resource strain: Not on file  . Food insecurity    Worry: Not on file    Inability: Not on file  . Transportation needs    Medical: Not on file    Non-medical: Not on file  Tobacco Use  . Smoking status: Never Smoker  . Smokeless tobacco: Never Used  Substance and Sexual Activity  . Alcohol use: No    Alcohol/week: 1.0 - 2.0 standard drinks    Types: 1 - 2 Standard drinks or equivalent per week    Comment: discontinued use during pregnancy  . Drug use: No  . Sexual activity: Not Currently    Partners: Male  Lifestyle  . Physical activity    Days per week: Not on file    Minutes per session: Not on file  . Stress: Not on file  Relationships  . Social Herbalist on phone: Not on file    Gets together: Not on file    Attends religious service: Not on file    Active member  of club or organization: Not on file    Attends meetings of clubs or organizations: Not on file    Relationship status: Not on file  . Intimate partner violence    Fear of current or ex partner: Not on file    Emotionally abused: Not on file    Physically abused: Not on file    Forced sexual activity: Not on file  Other Topics Concern  . Not on file  Social History Narrative  . Not on file    Outpatient Medications Prior to Visit  Medication Sig Dispense Refill  . ALPRAZolam (XANAX XR) 1 MG 24 hr tablet Take by mouth.    . clonazePAM (KLONOPIN) 1 MG tablet Take 1 mg by mouth 2 (two) times daily as needed. Two tablets at bedtime    . escitalopram (LEXAPRO) 10 MG tablet Take 10 mg by mouth daily.    Marland Kitchen esomeprazole (NEXIUM) 40 MG capsule Take 20 mg by mouth daily.     Marland Kitchen loratadine (CLARITIN) 10  MG tablet Take 10 mg by mouth daily.    . meloxicam (MOBIC) 15 MG tablet Take 1 tablet (15 mg total) by mouth daily. 30 tablet 0  . zolpidem (AMBIEN) 5 MG tablet Take 1 tablet (5 mg total) by mouth at bedtime as needed for sleep (insomnia). 30 tablet 5   No facility-administered medications prior to visit.       ROS:  Review of Systems BREAST: No symptoms   OBJECTIVE:   Vitals:  There were no vitals taken for this visit.  Physical Exam  Results: No results found for this or any previous visit (from the past 24 hour(s)).   Assessment/Plan: No diagnosis found.    No orders of the defined types were placed in this encounter.     No follow-ups on file.  Alicia B. Copland, PA-C 02/27/2019 8:48 PM

## 2019-02-28 ENCOUNTER — Encounter: Payer: Self-pay | Admitting: Obstetrics and Gynecology

## 2019-03-13 ENCOUNTER — Encounter: Payer: Self-pay | Admitting: Obstetrics and Gynecology

## 2019-03-20 ENCOUNTER — Ambulatory Visit: Payer: Self-pay | Admitting: Obstetrics and Gynecology

## 2019-03-20 ENCOUNTER — Encounter: Payer: Self-pay | Admitting: Obstetrics and Gynecology

## 2019-04-17 ENCOUNTER — Ambulatory Visit: Payer: Medicare Other | Admitting: Obstetrics and Gynecology

## 2019-06-12 ENCOUNTER — Telehealth: Payer: Medicare Other | Admitting: Family Medicine

## 2019-07-07 ENCOUNTER — Telehealth: Payer: Medicare Other | Admitting: Family Medicine

## 2019-07-11 ENCOUNTER — Telehealth: Payer: Medicare Other | Admitting: Internal Medicine

## 2019-08-22 NOTE — Progress Notes (Signed)
Patient ID: [name], female    DOB: [birth year], 44 y.o.   MRN: [id number]  PCP: Donna Malkin, MD  Chief Complaint  Patient presents with  . New Patient (Initial Visit)  . Establish Care  . Hypothyroidism  . Insomnia  . Anxiety  . Lupus    Subjective:   Donna Haas is a 44 y.o. female, presents to clinic with CC of the following:  Chief Complaint  Patient presents with  . New Patient (Initial Visit)  . Establish Care  . Hypothyroidism  . Insomnia  . Anxiety  . Lupus    HPI:  Patient is a 44 year old female who presents today to reestablish with this practice. She was last seen at [facility] in October 2017 She noted when screened that she was recently seen in urgent care for bronchitis, retested for Covid and negative, Z-pak given with sx'ic measures and notes allergies horrible now too, cough still lingering, and per protocol, this visit was changed to a phone visit very shortly before the planned visit.  This does greatly limit this first visit with me today.  Past medical history is notable for lupus; diagnosed with lupus around 44 years of age, symptoms since 3;  Sees rheumatology in [place] and wants to get someone closer Off plaquenil due to vision issues, entertained infusions recently but not yet started A note from Dr. Francesca Oman office (rheumatologist) - PATIENT CALLED TO CANCEL NEW PT [place]. PATIENT'S APPOINTMENT WAS THE DAY OF CANCELLATION AGAIN. PER DOCTOR BOCK WE ARE NOT TO RESCHEDULE PATIENT DUE TO 9 CANCELLATIONS IN A ROW AND 2 NO SHOWS FOR 2016 AND 2017.   The patient noted today that there was some error in the communication there, and she often did not realize she even had appointments.  she is presently having a lot of pain in joints and muscles, also with flares of butterfly rash on cheeks; has developed neuropathy in right hand > left. Questioned if had RA recently as well.  She also had gestational diabetes; when  she delivered in May, 2017, no h/o DM.  Sister with DM and wants to get tested. Occas thirsty, fatigued, has neuropathy symptoms in her distal upper extremities (question if related to her lupus or possible rheumatoid arthritis noted previously)  hypothyroidism - Patient had two nodules removed from her thyroid. Were benign. when put on a supp medicine two times, numbers went high and felt ill. Not taking presently; two further nodules were found, unclear if cysts per patient and last check a couple years ago per patient.   Insomnia; has had two sleep studies; no sleep apnea, told has insomnia. Wilburn Cornelia per psychiatrist, and notes helpful  carpal tunnel -  right side only; hurts really bad; has to sleep sitting up; right-handed; using brace  Anxiety/binge ED - Has had ED since middle school, sees Dr. Toy Care for anxiety/ED in [place], sees every 6 months, has been psychiatrist since age 77. She notes weight fluctuates rapidly, and over past year has gained weight. She notes last year this time was 150 lbs. Wt Readings from Last 3 Encounters:  08/23/19 215 lb (97.5 kg)  03/22/17 214 lb (97.1 kg)  01/30/16 214 lb (97.1 kg)    Barrett's - diagnosed many years ago, age 73. Had several endoscopies done, last one in about 2015 and noted no change -takes esomeprazole 40 mg daily. Has sx's if not take medicine.  Obesity -patient has noted significant fluctuations in  her weight, with her noting she was as low as 150 pounds last year.  She is now back over 200.  She is being managed for an eating disorder with her psychiatrist.  Asthma - had in middle school, grew out of it, back in college and used inhaler daily then, and more allergy based as worse with allergy sx's, uses Alb inhaler prn (just renewed by urgent care).Not use daily inhaler since college, has one now as given by urgent care - Flovent bid.    Woman's health - sees [facility], due in August for annual and had mammogram in  October and was ok.   Tobacco-no history of tob, mother did smoke Alcohol- none Single mom, two daughters with help from parents   Patient Active Problem List   Diagnosis Date Noted  . Hypertriglyceridemia 02/07/2016  . Morbid obesity ([place]) 01/25/2016  . Abnormal weight gain 01/21/2016  . Barrett's esophagus determined by biopsy 01/21/2016  . Anemia 01/21/2016  . Fatigue 01/21/2016  . Carpal tunnel syndrome, bilateral 01/21/2016  . Preventative health care 01/21/2016  . Hx gestational diabetes 01/21/2016  . Cesarean delivery delivered 08/31/2015  . Headache 09/08/2012  . Neck pain 09/08/2012  . MOLE 02/22/2009  . INSOMNIA 09/21/2008  . FOOT SPRAIN 09/21/2008  . PANCREATITIS, HX OF 04/17/2008  . NEPHROLITHIASIS, HX OF 04/17/2008  . ANXIETY 02/16/2008  . THYROID NODULE 01/12/2008  . PARESTHESIA 12/14/2007  . OBSTRUCTIVE SLEEP APNEA 10/03/2007  . THYROID CYST 05/30/2007  . GERD 05/30/2007  . ENDOMETRIOSIS OF OTHER SPECIFIED SITES 05/30/2007  . Lupus erythematosus 05/30/2007  . HERNIATED LUMBAR [place] 05/30/2007  . BACK PAIN 05/30/2007  . PLANTAR FASCIITIS, BILATERAL 05/30/2007  . SNORING, HX OF 05/30/2007      Current Outpatient Medications:  .  ALPRAZolam (XANAX XR) 1 MG 24 hr tablet, Take by mouth., Disp: , Rfl:  .  clonazePAM (KLONOPIN) 1 MG tablet, Take 1 mg by mouth 2 (two) times daily as needed. Two tablets at bedtime, Disp: , Rfl:  .  dextroamphetamine (DEXTROSTAT) 10 MG tablet, dextroamphetamine 10 mg tablet, Disp: , Rfl:  .  EPINEPHrine 0.3 mg/0.3 mL IJ SOAJ injection, epinephrine 0.3 mg/0.3 mL injection, auto-injector, Disp: , Rfl:  .  escitalopram (LEXAPRO) 10 MG tablet, Take 10 mg by mouth daily., Disp: , Rfl:  .  esomeprazole (NEXIUM) 40 MG capsule, Take 20 mg by mouth daily. , Disp: , Rfl:  .  ferrous sulfate 325 (65 FE) MG tablet, ferrous sulfate 325 mg (65 mg iron) tablet  TAKE 1 TABLET BY MOUTH EVERY DAY, Disp: , Rfl:  .  ibuprofen (ADVIL) 200 MG tablet,  Take 200 mg by mouth every 6 (six) hours as needed., Disp: , Rfl:  .  loratadine (CLARITIN) 10 MG tablet, Take 10 mg by mouth daily., Disp: , Rfl:  .  tiZANidine (ZANAFLEX) 4 MG tablet, Zanaflex 4 mg tablet  Take 1 tablet every 6 hours by oral route., Disp: , Rfl:  .  VENTOLIN HFA 108 (90 Base) MCG/ACT inhaler, , Disp: , Rfl:  .  zolpidem (AMBIEN) 5 MG tablet, Take 1 tablet (5 mg total) by mouth at bedtime as needed for sleep (insomnia)., Disp: 30 tablet, Rfl: 5   Allergies  Allergen Reactions  . Prednisone Anaphylaxis  . Prochlorperazine Edisylate Anaphylaxis  . Hydrocodone-Acetaminophen Nausea Only  . Morphine And Related Nausea Only and Other (See Comments)    Mood swings, headache     Past Surgical History:  Procedure Laterality Date  .  APPENDECTOMY    . CESAREAN SECTION     x2  . CESAREAN SECTION WITH BILATERAL TUBAL LIGATION Bilateral 08/30/2015   Procedure: CESAREAN SECTION WITH BILATERAL TUBAL LIGATION;  Surgeon: Honor Loh Ward, MD;  Location: [facility] ORS;  Service: Obstetrics;  Laterality: Bilateral;  . CHOLECYSTECTOMY    . LAPAROSCOPY     x3  . THYROID CYST EXCISION     Dr. Trena Platt, [facility]     Family History  Problem Relation Age of Onset  . Depression Mother        Bi-polar  . Asthma Mother   . Allergies Mother   . Hyperlipidemia Other   . Hypertension Other   . Heart disease Other   . Allergies Sister   . Diabetes Sister   . Rheum arthritis Maternal Grandmother      Social History   Tobacco Use  . Smoking status: Never Smoker  . Smokeless tobacco: Never Used  Substance Use Topics  . Alcohol use: No    Alcohol/week: 1.0 - 2.0 standard drinks    Types: 1 - 2 Standard drinks or equivalent per week    Comment: discontinued use during pregnancy    With staff assistance, above reviewed with the patient today.  ROS: As per HPI, he denies any dysuria or hematuria, no chronic abdominal pains or dark, black stools, no bleeding per rectum, denies any chest  pains, palpitations, with some recent increasing shortness of breath with her likely asthmatic bronchitis diagnosis.  Otherwise no specific complaints on a limited and focused system review   No results found for this or any previous visit (from the past 72 hour(s)).   PHQ2/9: Depression screen [facility] 2/9 08/23/2019 01/21/2016 06/25/2015 02/14/2015 11/14/2014  Decreased Interest 0 0 0 0 0  Down, Depressed, Hopeless 0 0 0 0 0  PHQ - 2 Score 0 0 0 0 0  Altered sleeping 0 - - - -  Tired, decreased energy 0 - - - -  Change in appetite 1 - - - -  Feeling bad or failure about yourself  0 - - - -  Trouble concentrating 0 - - - -  Moving slowly or fidgety/restless 0 - - - -  Suicidal thoughts 0 - - - -  PHQ-9 Score 1 - - - -  Difficult doing work/chores Not difficult at all - - - -   PHQ-2/9 Result reviewed  Fall Risk: Fall Risk  08/23/2019 01/21/2016 07/04/2015 06/25/2015 04/11/2015  Falls in the past year? 1 No No No No  Number falls in past yr: 0 - - - -  Injury with Fall? 0 - - - -      Objective:   Vitals:   08/23/19 1311  Temp: 97.8 F (36.6 C)  TempSrc: Temporal  Weight: 215 lb (97.5 kg)  Height: 5' (1.524 m)    Body mass index is 41.99 kg/m.  Physical Exam   NAD, masked HEENT - [place]/AT, sclera anicteric, PERRL, EOMI, conj - non-inj'ed, TM's and canals clear, pharynx clear Neck - supple, no adenopathy, no TM, carotids 2+ and = without bruits bilat Car - RRR without m/g/r Pulm- RR and effort normal at rest, CTA without wheeze or rales Abd - soft, NT, ND, BS+,  no masses Back - no CVA tenderness Skin- no rash noted, no new lesions of concern noted on exposed areas, patient denied otherwise Ext - no LE edema, no active joints Neuro/psychiatric - affect was not flat, appropriate with conversation  Alert and oriented  Grossly non-focal - good strength on testing extremities, sensation intact to LT in distal extremities  Speech and gait are normal   Results for orders placed or  performed during the hospital encounter of [id number]  Basic metabolic panel  Result Value Ref Range   Sodium 138 135 - 145 mmol/L   Potassium 4.1 3.5 - 5.1 mmol/L   Chloride 105 101 - 111 mmol/L   CO2 24 22 - 32 mmol/L   Glucose, Bld 122 (H) 65 - 99 mg/dL   BUN 10 6 - 20 mg/dL   Creatinine, Ser 0.77 0.44 - 1.00 mg/dL   Calcium 9.0 8.9 - 10.3 mg/dL   GFR calc non Af Amer >60 >60 mL/min   GFR calc Af Amer >60 >60 mL/min   Anion gap 9 5 - 15  CBC  Result Value Ref Range   WBC 6.4 3.6 - 11.0 K/uL   RBC 4.19 3.80 - 5.20 MIL/uL   Hemoglobin 8.9 (L) 12.0 - 16.0 g/dL   HCT 28.4 (L) 35.0 - 47.0 %   MCV 67.7 (L) 80.0 - 100.0 fL   MCH 21.3 (L) 26.0 - 34.0 pg   MCHC 31.4 (L) 32.0 - 36.0 g/dL   RDW 17.5 (H) 11.5 - 14.5 %   Platelets 289 150 - 440 K/uL  Troponin I  Result Value Ref Range   Troponin I <0.03 <0.03 ng/mL  Troponin I  Result Value Ref Range   Troponin I <0.03 <0.03 ng/mL  Fibrin derivatives D-Dimer ([facility] only)  Result Value Ref Range   Fibrin derivatives D-dimer ([facility]) 281.85 0.00 - 499.00 ng/mL (FEU)  VITAMIN D 25 Hydroxy (Vit-D Deficiency, Fractures)  Result Value Ref Range   Vit D, 25-Hydroxy 35.6 30.0 - 100.0 ng/mL   She did have an HIV test in the not too distant past, and notes her OB/GYN practice did that, does not think she had a hepatitis C antibody screen previously, and was okay with doing that with the labs ordered.    Assessment & Plan:   1. Encounter to establish care with new doctor   2. Systemic lupus erythematosus, unspecified SLE type, unspecified organ involvement status [facility]) Patient noted she has been more recently followed by a rheumatologist in [place], and would like to see somebody closer here in [facility].  She notes she is quite symptomatic, with some type of infusion therapy entertained more recently. A referral to rheumatology was provided today noting her preference to stay in [facility]. - Lipid panel; Future - Thyroid Panel With  TSH; Future - CBC with Differential/Platelet; Future - COMPLETE METABOLIC PANEL WITH GFR; Future - Urinalysis, Complete; Future - Ambulatory referral to Endocrinology - Ambulatory referral to Rheumatology  3. Hypothyroidism, unspecified type Was on iron supplement at some point in the past a couple times, although noted that made her worse symptomatically.  She does not take 1 presently. Do need to recheck labs and ordered. Also, a referral to endocrine was placed due to the nodule concern. - Lipid panel; Future - Thyroid Panel With TSH; Future - Ambulatory referral to Endocrinology  4. THYROID NODULEs Had a couple removed, with 2 further then diagnosed.  She has not had reassessed and at least the last couple years, maybe longer I do feel having endocrine input will be helpful.  She mentioned the term cysts when we were discussing, although when I noted the differences, she did think it was more likely nodules with the 2 more recent ones found. Also will  check labs as ordered - Thyroid Panel With TSH; Future - Ambulatory referral to Endocrinology - Ambulatory referral to Rheumatology  5. Insomnia, unspecified type Takes Ambien, and discussed concerns with that medication, and she noted that the psychiatrist prescribes it for her.  She also takes other medicines that I noted I would not be able to refill for her including Xanax, Klonopin, and dextroamphetamine.  She is also on Lexapro.  She notes she plans to continue to see her psychiatrist, and has for many years, and that these medicines will continue to be obtained through her psychiatrist as felt indicated.  6. Carpal tunnel syndrome, bilateral Has been diagnosed with this in the past, and wonder if it is contributing to her neuropathy symptoms in her hands, although unable to examine today as this was a phone visit. She states the neuropathy symptoms have been felt to be more likely related to either lupus or rheumatoid arthritis  condition on recent assessments.  7. Anxiety state Sees psychiatry, and on medications to help. She noted a daughter's injury sustained (head trauma) related to an event at school has been very stressful for her over this past year and a big contributor.  8. Barrett's esophagus determined by biopsy Discussed this entity and the importance of surveillance over time as this is a potentially precancerous condition.  It has been over 5 years since she has had a scoping procedure done. Did feel a referral to gastroenterology was indicated and await their further input with likely another scoping procedure to help reassess. Continue the PPI presently, as she notes she is very symptomatic when she does not take the PPI - CBC with Differential/Platelet; Future - Ambulatory referral to Gastroenterology  9. Binge eating disorder Followed by psychiatry for this, and has had significant fluctuations in weight, with a more recent weight gain fluctuation noted. We will check some lab tests as well presently. - CBC with Differential/Platelet; Future - COMPLETE METABOLIC PANEL WITH GFR; Future  10. Morbid obesity ([place]) As above, has had significant fluctuations noted by her history, with her weight now back over 200 pounds. Continue follow-up with psychiatry to help with her eating disorder management. - Lipid panel; Future  11. Mild intermittent asthma without complication She notes she was feeling like she was having more asthma symptoms again before she had this recent bronchitis diagnosis, and a Flovent inhaler was added recently for this diagnosis.  She is back to taking that twice a day.  Also has albuterol to use as needed. We will see how she does as her bronchitis symptoms improved, with limitations in being able to get any type of spirometry testing still due to the Covid pandemic.  12. Encounter for hepatitis C screening test for low risk patient She agreed to have the hepatitis C screening  antibody - Hepatitis C antibody; Future  13. Screening for diabetes mellitus We will check glucose initially, with the A1c order recommended for screening denied as not covered by her insurance.  If the glucose is elevated, will then check an A1c.  14. Screening for lipid disorders Lipid panel ordered. - Lipid panel; Future  15. Asthmatic bronchitis with acute exacerbation, unspecified asthma severity, unspecified whether persistent Recently diagnosed with this, with the above-noted inhalers added, and a Z-Pak also added, and she does think she is getting a little better presently.  Will await until the symptoms have resolved, then have her return for labs to be obtained, and a follow-up visit in person about a week  after that. Can follow-up sooner as needed.       Donna Malkin, MD 08/23/19 1:41 PM

## 2019-08-23 ENCOUNTER — Encounter: Payer: Self-pay | Admitting: Internal Medicine

## 2019-08-23 ENCOUNTER — Ambulatory Visit (INDEPENDENT_AMBULATORY_CARE_PROVIDER_SITE_OTHER): Payer: Medicare Other | Admitting: Internal Medicine

## 2019-08-23 ENCOUNTER — Other Ambulatory Visit: Payer: Self-pay

## 2019-08-23 VITALS — Temp 97.8°F | Ht 60.0 in | Wt 215.0 lb

## 2019-08-23 DIAGNOSIS — J4520 Mild intermittent asthma, uncomplicated: Secondary | ICD-10-CM | POA: Diagnosis not present

## 2019-08-23 DIAGNOSIS — E041 Nontoxic single thyroid nodule: Secondary | ICD-10-CM | POA: Diagnosis not present

## 2019-08-23 DIAGNOSIS — E039 Hypothyroidism, unspecified: Secondary | ICD-10-CM | POA: Insufficient documentation

## 2019-08-23 DIAGNOSIS — Z131 Encounter for screening for diabetes mellitus: Secondary | ICD-10-CM

## 2019-08-23 DIAGNOSIS — Z1159 Encounter for screening for other viral diseases: Secondary | ICD-10-CM | POA: Diagnosis not present

## 2019-08-23 DIAGNOSIS — G4700 Insomnia, unspecified: Secondary | ICD-10-CM | POA: Diagnosis not present

## 2019-08-23 DIAGNOSIS — F411 Generalized anxiety disorder: Secondary | ICD-10-CM

## 2019-08-23 DIAGNOSIS — Z7689 Persons encountering health services in other specified circumstances: Secondary | ICD-10-CM | POA: Diagnosis not present

## 2019-08-23 DIAGNOSIS — F5081 Binge eating disorder: Secondary | ICD-10-CM

## 2019-08-23 DIAGNOSIS — K2270 Barrett's esophagus without dysplasia: Secondary | ICD-10-CM | POA: Diagnosis not present

## 2019-08-23 DIAGNOSIS — M329 Systemic lupus erythematosus, unspecified: Secondary | ICD-10-CM

## 2019-08-23 DIAGNOSIS — Z833 Family history of diabetes mellitus: Secondary | ICD-10-CM

## 2019-08-23 DIAGNOSIS — J45901 Unspecified asthma with (acute) exacerbation: Secondary | ICD-10-CM

## 2019-08-23 DIAGNOSIS — F50819 Binge eating disorder, unspecified: Secondary | ICD-10-CM | POA: Insufficient documentation

## 2019-08-23 DIAGNOSIS — Z13220 Encounter for screening for lipoid disorders: Secondary | ICD-10-CM

## 2019-08-23 DIAGNOSIS — G5603 Carpal tunnel syndrome, bilateral upper limbs: Secondary | ICD-10-CM | POA: Diagnosis not present

## 2019-12-12 ENCOUNTER — Ambulatory Visit (INDEPENDENT_AMBULATORY_CARE_PROVIDER_SITE_OTHER): Payer: Medicare Other

## 2019-12-12 DIAGNOSIS — Z0000 Encounter for general adult medical examination without abnormal findings: Secondary | ICD-10-CM

## 2019-12-12 NOTE — Progress Notes (Signed)
Subjective:   Donna Haas is a 44 y.o. female who presents for an Initial Medicare Annual Wellness Visit.  Virtual Visit via Telephone Note  I connected with  Donna Haas on 12/13/19 at  1:30 PM EDT by telephone and verified that I am speaking with the correct person using two identifiers.  Medicare Annual Wellness visit completed telephonically due to Covid-19 pandemic.   Location: Patient: home Provider: [place]   I discussed the limitations, risks, security and privacy concerns of performing an evaluation and management service by telephone and the availability of in person appointments. The patient expressed understanding and agreed to proceed.  Unable to perform video visit due to video visit attempted and failed and/or patient does not have video capability.   Some vital signs may be absent or patient reported.   Clemetine Marker, LPN    Review of Systems     Cardiac Risk Factors include: obesity (BMI >30kg/m2)     Objective:    Today's Vitals   12/12/19 1319  PainSc: 5    There is no height or weight on file to calculate BMI.  Advanced Directives 12/12/2019 01/30/2016 01/21/2016 08/30/2015 08/29/2015 06/25/2015 02/14/2015  Does Patient Have a Medical Advance Directive? No No No No No No No  Would patient like information on creating a medical advance directive? Yes (MAU/Ambulatory/Procedural Areas - Information given) No - patient declined information No - patient declined information No - patient declined information - No - patient declined information No - patient declined information    Current Medications (verified) Outpatient Encounter Medications as of 12/12/2019  Medication Sig  . ALPRAZolam (XANAX) 1 MG tablet Take 1 tablet by mouth at bedtime.  . clonazePAM (KLONOPIN) 1 MG tablet Take 1 mg by mouth 2 (two) times daily as needed. Two tablets at bedtime  . dextroamphetamine (DEXTROSTAT) 10 MG tablet dextroamphetamine 10 mg tablet  . EPINEPHrine 0.3 mg/0.3 mL  IJ SOAJ injection epinephrine 0.3 mg/0.3 mL injection, auto-injector  . escitalopram (LEXAPRO) 20 MG tablet Take 1 tablet by mouth daily.  Marland Kitchen esomeprazole (NEXIUM) 40 MG capsule Take 20 mg by mouth daily.   . ferrous sulfate 325 (65 FE) MG tablet ferrous sulfate 325 mg (65 mg iron) tablet  TAKE 1 TABLET BY MOUTH EVERY DAY  . ibuprofen (ADVIL) 800 MG tablet Take 1 tablet by mouth in the morning and at bedtime.  Marland Kitchen loratadine (CLARITIN) 10 MG tablet Take 10 mg by mouth daily.  Marland Kitchen zolpidem (AMBIEN) 10 MG tablet Take 1 tablet by mouth at bedtime.  . [DISCONTINUED] ALPRAZolam (XANAX XR) 1 MG 24 hr tablet Take by mouth.  . [DISCONTINUED] escitalopram (LEXAPRO) 10 MG tablet Take 10 mg by mouth daily.  . [DISCONTINUED] ibuprofen (ADVIL) 200 MG tablet Take 200 mg by mouth every 6 (six) hours as needed.  . [DISCONTINUED] tiZANidine (ZANAFLEX) 4 MG tablet Zanaflex 4 mg tablet  Take 1 tablet every 6 hours by oral route.  . [DISCONTINUED] VENTOLIN HFA 108 (90 Base) MCG/ACT inhaler   . [DISCONTINUED] zolpidem (AMBIEN) 5 MG tablet Take 1 tablet (5 mg total) by mouth at bedtime as needed for sleep (insomnia).   No facility-administered encounter medications on file as of 12/12/2019.    Allergies (verified) Compazine [prochlorperazine], Prednisone, Prochlorperazine edisylate, Etodolac, Hydrocodone-acetaminophen, and Morphine and related   History: Past Medical History:  Diagnosis Date  . Anxiety   . Back pain   . Endometriosis   . GERD (gastroesophageal reflux disease)   . Gestational diabetes   .  Lumbar herniated disc   . Lupus ([place])   . Migraines   . Morbid obesity ([place]) 01/25/2016  . Nephrolithiasis   . Obesity   . Pancreatitis   . Plantar fasciitis   . Thyroid cyst    Past Surgical History:  Procedure Laterality Date  . APPENDECTOMY    . CESAREAN SECTION     x2  . CESAREAN SECTION WITH BILATERAL TUBAL LIGATION Bilateral 08/30/2015   Procedure: CESAREAN SECTION WITH BILATERAL TUBAL  LIGATION;  Surgeon: Honor Loh Ward, MD;  Location: [facility] ORS;  Service: Obstetrics;  Laterality: Bilateral;  . CHOLECYSTECTOMY    . LAPAROSCOPY     x3  . THYROID CYST EXCISION     Dr. Trena Platt, [facility]   Family History  Problem Relation Age of Onset  . Depression Mother        Bi-polar  . Asthma Mother   . Allergies Mother   . Sleep apnea Mother   . Hyperlipidemia Other   . Hypertension Other   . Heart disease Other   . Allergies Sister   . Diabetes Sister   . Rheum arthritis Maternal Grandmother    Social History   Socioeconomic History  . Marital status: Divorced    Spouse name: Not on file  . Number of children: 2  . Years of education: Not on file  . Highest education level: Associate degree: academic program  Occupational History  . Occupation: disability    Employer: UNEMPLOYED  Tobacco Use  . Smoking status: Never Smoker  . Smokeless tobacco: Never Used  Vaping Use  . Vaping Use: Never used  Substance and Sexual Activity  . Alcohol use: No    Alcohol/week: 1.0 - 2.0 standard drink    Types: 1 - 2 Standard drinks or equivalent per week    Comment: discontinued use during pregnancy  . Drug use: No  . Sexual activity: Not Currently    Partners: Male  Other Topics Concern  . Not on file  Social History Narrative  . Not on file   Social Determinants of Health   Financial Resource Strain: Low Risk   . Difficulty of Paying Living Expenses: Not very hard  Food Insecurity: No Food Insecurity  . Worried About Charity fundraiser in the Last Year: Never true  . Ran Out of Food in the Last Year: Never true  Transportation Needs: No Transportation Needs  . Lack of Transportation (Medical): No  . Lack of Transportation (Non-Medical): No  Physical Activity: Inactive  . Days of Exercise per Week: 0 days  . Minutes of Exercise per Session: 0 min  Stress: Stress Concern Present  . Feeling of Stress : To some extent  Social Connections: Moderately Integrated  .  Frequency of Communication with Friends and Family: More than three times a week  . Frequency of Social Gatherings with Friends and Family: More than three times a week  . Attends Religious Services: More than 4 times per year  . Active Member of Clubs or Organizations: Yes  . Attends Archivist Meetings: More than 4 times per year  . Marital Status: Divorced    Tobacco Counseling Counseling given: Not Answered   Clinical Intake:  Pre-visit preparation completed: Yes  Pain : 0-10 Pain Score: 5  Pain Type: Neuropathic pain Pain Location: Arm Pain Orientation: Right, Left Pain Descriptors / Indicators: Dull, Constant, Shooting, Tingling Pain Onset: More than a month ago Pain Frequency: Constant     Nutritional Risks: None Diabetes:  No  How often do you need to have someone help you when you read instructions, pamphlets, or other written materials from your doctor or pharmacy?: 1 - Never    Interpreter Needed?: No  Information entered by :: Clemetine Marker LPN   Activities of Daily Living In your present state of health, do you have any difficulty performing the following activities: 12/12/2019 08/23/2019  Hearing? N Y  Comment declines hearing aids 50% deaf in left ear  Vision? N N  Difficulty concentrating or making decisions? N Y  Comment - remembering  Walking or climbing stairs? N Y  Comment - knee and ankle pain  Dressing or bathing? N N  Doing errands, shopping? N N  Preparing Food and eating ? N -  Using the Toilet? N -  In the past six months, have you accidently leaked urine? N -  Do you have problems with loss of bowel control? N -  Managing your Medications? N -  Managing your Finances? N -  Housekeeping or managing your Housekeeping? N -  Some recent data might be hidden    Patient Care Team: Towanda Malkin, MD as PCP - General (Internal Medicine)  Indicate any recent Medical Services you may have received from other than [facility]  providers in the past year (date may be approximate).     Assessment:   This is a routine wellness examination for [place].  Hearing/Vision screen  Hearing Screening   125Hz  250Hz  500Hz  1000Hz  2000Hz  3000Hz  4000Hz  6000Hz  8000Hz   Right ear:           Left ear:           Comments: Pt denies hearing difficulty  Vision Screening Comments: Past due for eye exam at [place] issues and exercise activities discussed: Current Exercise Habits: The patient does not participate in regular exercise at present, Exercise limited by: neurologic condition(s)  Goals    . Weight (lb) < 200 lb (90.7 kg)     Patient states she would like to lose weight over the next year with physical activity and healthy eating.       Depression Screen PHQ 2/9 Scores 12/12/2019 08/23/2019 01/21/2016 06/25/2015 02/14/2015 11/14/2014  PHQ - 2 Score 0 0 0 0 0 0  PHQ- 9 Score - 1 - - - -    Fall Risk Fall Risk  12/12/2019 08/23/2019 01/21/2016 07/04/2015 06/25/2015  Falls in the past year? 1 1 No No No  Number falls in past yr: 1 0 - - -  Injury with Fall? 0 0 - - -  Risk for fall due to : History of fall(s) - - - -  Follow up Falls prevention discussed - - - -    Any stairs in or around the home? Yes  If so, are there any without handrails? Yes   - 2 steps outside  Home free of loose throw rugs in walkways, pet beds, electrical cords, etc? Yes  Adequate lighting in your home to reduce risk of falls? Yes   ASSISTIVE DEVICES UTILIZED TO PREVENT FALLS:  Life alert? No  Use of a cane, walker or w/c? No  Grab bars in the bathroom? No  Shower chair or bench in shower? No  Elevated toilet seat or a handicapped toilet? No   TIMED UP AND GO:  Was the test performed? No . Telephonic visit.   Cognitive Function: 6CIT deferred; pt has no memory issues  Immunizations Immunization History  Administered Date(s) Administered  . Influenza Whole 02/09/2008, 01/31/2009, 01/28/2010  . Influenza,inj,Quad  PF,6+ Mos 01/05/2014, 02/12/2015, 01/21/2016  . Influenza,inj,quad, With Preservative 12/12/2017  . PFIZER SARS-COV-2 Vaccination 08/03/2019, 08/24/2019  . Td 04/22/2004  . Tdap 04/22/2004, 04/22/2004, 07/04/2015    TDAP status: Due, Education has been provided regarding the importance of this vaccine. Advised may receive this vaccine at local pharmacy or Health Dept. Aware to provide a copy of the vaccination record if obtained from local pharmacy or Health Dept. Verbalized acceptance and understanding.   Flu Vaccine status: Declined, Education has been provided regarding the importance of this vaccine but patient still declined. Advised may receive this vaccine at local pharmacy or Health Dept. Aware to provide a copy of the vaccination record if obtained from local pharmacy or Health Dept. Verbalized acceptance and understanding.   Pneumococcal vaccine status: due at age 13  Covid-19 vaccine status: Completed vaccines  Qualifies for Shingles Vaccine? No  - due at age 49  Screening Tests Health Maintenance  Topic Date Due  . PNEUMOCOCCAL POLYSACCHARIDE VACCINE AGE 72-64 HIGH RISK  Never done  . OPHTHALMOLOGY EXAM  Never done  . URINE MICROALBUMIN  Never done  . HEMOGLOBIN A1C  07/21/2016  . FOOT EXAM  01/20/2017  . INFLUENZA VACCINE  11/12/2019  . PAP SMEAR-Modifier  01/03/2020  . TETANUS/TDAP  07/03/2025  . COVID-19 Vaccine  Completed  . Hepatitis C Screening  Completed  . HIV Screening  Completed    Health Maintenance  Health Maintenance Due  Topic Date Due  . PNEUMOCOCCAL POLYSACCHARIDE VACCINE AGE 72-64 HIGH RISK  Never done  . OPHTHALMOLOGY EXAM  Never done  . URINE MICROALBUMIN  Never done  . HEMOGLOBIN A1C  07/21/2016  . FOOT EXAM  01/20/2017  . INFLUENZA VACCINE  11/12/2019  . PAP SMEAR-Modifier  01/03/2020   Colon cancer screening status: pt states she has had colonoscopy previously; no records available at this time. Pt has been referred to gastroenterology and  plans to discuss recommendations with them.  Mammogram status: Completed 01/19/18. Ordered by GYN.. Repeat every year    Lung Cancer Screening: (Low Dose CT Chest recommended if Age 72-80 years, 30 pack-year currently smoking OR have quit w/in 15years.) does not qualify.   Additional Screening:  Hepatitis C Screening: does qualify; Completed 06/21/2007  Vision Screening: Recommended annual ophthalmology exams for early detection of glaucoma and other disorders of the eye. Is the patient up to date with their annual eye exam?  No  Who is the provider or what is the name of the office in which the patient attends annual eye exams? [place] Screening: Recommended annual dental exams for proper oral hygiene  Community Resource Referral / Chronic Care Management: CRR required this visit?  No   CCM required this visit?  No      Plan:     I have personally reviewed and noted the following in the patient's chart:   . Medical and social history . Use of alcohol, tobacco or illicit drugs  . Current medications and supplements . Functional ability and status . Nutritional status . Physical activity . Advanced directives . List of other physicians . Hospitalizations, surgeries, and ER visits in previous 12 months . Vitals . Screenings to include cognitive, depression, and falls . Referrals and appointments  In addition, I have reviewed and discussed with patient certain preventive protocols, quality metrics, and best practice recommendations. A written personalized care plan  for preventive services as well as general preventive health recommendations were provided to patient.     Clemetine Marker, LPN   [id number]   Nurse Notes: Pt was referred to endocrinology, rheumatology and gastroenterology in May of 2021. She has not had any of these follow up appts. Phone # provided for [facility] and [facility] with Dr. Gabriel Carina. Msg sent to referral coordinator for rheumatology referral  follow up for [facility] with [facility].   Patient also inquired as to whether she is varicella immune; per Lab results from 1010/16 she is NON immune to varicella.   Patient encouraged to schedule in office visit with Dr. Roxan Hockey for routine follow up and labs and scheduled annual with GYN due to previously canceled appointments.

## 2019-12-13 DIAGNOSIS — F411 Generalized anxiety disorder: Secondary | ICD-10-CM | POA: Insufficient documentation

## 2019-12-13 NOTE — Patient Instructions (Signed)
Donna Haas , Thank you for taking time to come for your Medicare Wellness Visit. I appreciate your ongoing commitment to your health goals. Please review the following plan we discussed and let me know if I can assist you in the future.   Screening recommendations/referrals: Colonoscopy: please discuss future screening recommendations with [facility] and schedule appt at [phone] Mammogram: done 01/19/18. Please schedule annual exam with [facility]  Recommended yearly ophthalmology/optometry visit for glaucoma screening and checkup Recommended yearly dental visit for hygiene and checkup  Vaccinations: Influenza vaccine: due Pneumococcal vaccine: due age 39 Tdap vaccine: done 07/04/15 Shingles vaccine: due age 31  Covid-19: done 08/03/19 & 08/24/19  Advanced directives: Advance directive discussed with you today. I have provided a copy for you to complete at home and have notarized. Once this is complete please bring a copy in to our office so we can scan it into your chart.  Conditions/risks identified: Recommend healthy eating and increase physical activity for desired weight loss  Next appointment: Follow up in one year for your annual wellness visit.   Preventive Care 40-64 Years, Female Preventive care refers to lifestyle choices and visits with your health care provider that can promote health and wellness. What does preventive care include?  A yearly physical exam. This is also called an annual well check.  Dental exams once or twice a year.  Routine eye exams. Ask your health care provider how often you should have your eyes checked.  Personal lifestyle choices, including:  Daily care of your teeth and gums.  Regular physical activity.  Eating a healthy diet.  Avoiding tobacco and drug use.  Limiting alcohol use.  Practicing safe sex.  Taking low-dose aspirin daily starting at age 57.  Taking vitamin and mineral supplements as recommended  by your health care provider. What happens during an annual well check? The services and screenings done by your health care provider during your annual well check will depend on your age, overall health, lifestyle risk factors, and family history of disease. Counseling  Your health care provider may ask you questions about your:  Alcohol use.  Tobacco use.  Drug use.  Emotional well-being.  Home and relationship well-being.  Sexual activity.  Eating habits.  Work and work Statistician.  Method of birth control.  Menstrual cycle.  Pregnancy history. Screening  You may have the following tests or measurements:  Height, weight, and BMI.  Blood pressure.  Lipid and cholesterol levels. These may be checked every 5 years, or more frequently if you are over 56 years old.  Skin check.  Lung cancer screening. You may have this screening every year starting at age 62 if you have a 30-pack-year history of smoking and currently smoke or have quit within the past 15 years.  Fecal occult blood test (FOBT) of the stool. You may have this test every year starting at age 8.  Flexible sigmoidoscopy or colonoscopy. You may have a sigmoidoscopy every 5 years or a colonoscopy every 10 years starting at age 13.  Hepatitis C blood test.  Hepatitis B blood test.  Sexually transmitted disease (STD) testing.  Diabetes screening. This is done by checking your blood sugar (glucose) after you have not eaten for a while (fasting). You may have this done every 1-3 years.  Mammogram. This may be done every 1-2 years. Talk to your health care provider about when you should start having regular mammograms. This may depend on whether you have a family history of breast  cancer.  BRCA-related cancer screening. This may be done if you have a family history of breast, ovarian, tubal, or peritoneal cancers.  Pelvic exam and Pap test. This may be done every 3 years starting at age 68. Starting at age  18, this may be done every 5 years if you have a Pap test in combination with an HPV test.  Bone density scan. This is done to screen for osteoporosis. You may have this scan if you are at high risk for osteoporosis. Discuss your test results, treatment options, and if necessary, the need for more tests with your health care provider. Vaccines  Your health care provider may recommend certain vaccines, such as:  Influenza vaccine. This is recommended every year.  Tetanus, diphtheria, and acellular pertussis (Tdap, Td) vaccine. You may need a Td booster every 10 years.  Zoster vaccine. You may need this after age 42.  Pneumococcal 13-valent conjugate (PCV13) vaccine. You may need this if you have certain conditions and were not previously vaccinated.  Pneumococcal polysaccharide (PPSV23) vaccine. You may need one or two doses if you smoke cigarettes or if you have certain conditions. Talk to your health care provider about which screenings and vaccines you need and how often you need them. This information is not intended to replace advice given to you by your health care provider. Make sure you discuss any questions you have with your health care provider. Document Released: 04/26/2015 Document Revised: 12/18/2015 Document Reviewed: 01/29/2015 Elsevier Interactive Patient Education  2017 [place] Prevention in the Home Falls can cause injuries. They can happen to people of all ages. There are many things you can do to make your home safe and to help prevent falls. What can I do on the outside of my home?  Regularly fix the edges of walkways and driveways and fix any cracks.  Remove anything that might make you trip as you walk through a door, such as a raised step or threshold.  Trim any bushes or trees on the path to your home.  Use bright outdoor lighting.  Clear any walking paths of anything that might make someone trip, such as rocks or tools.  Regularly check to  see if handrails are loose or broken. Make sure that both sides of any steps have handrails.  Any raised decks and porches should have guardrails on the edges.  Have any leaves, snow, or ice cleared regularly.  Use sand or salt on walking paths during winter.  Clean up any spills in your garage right away. This includes oil or grease spills. What can I do in the bathroom?  Use night lights.  Install grab bars by the toilet and in the tub and shower. Do not use towel bars as grab bars.  Use non-skid mats or decals in the tub or shower.  If you need to sit down in the shower, use a plastic, non-slip stool.  Keep the floor dry. Clean up any water that spills on the floor as soon as it happens.  Remove soap buildup in the tub or shower regularly.  Attach bath mats securely with double-sided non-slip rug tape.  Do not have throw rugs and other things on the floor that can make you trip. What can I do in the bedroom?  Use night lights.  Make sure that you have a light by your bed that is easy to reach.  Do not use any sheets or blankets that are too big for your  bed. They should not hang down onto the floor.  Have a firm chair that has side arms. You can use this for support while you get dressed.  Do not have throw rugs and other things on the floor that can make you trip. What can I do in the kitchen?  Clean up any spills right away.  Avoid walking on wet floors.  Keep items that you use a lot in easy-to-reach places.  If you need to reach something above you, use a strong step stool that has a grab bar.  Keep electrical cords out of the way.  Do not use floor polish or wax that makes floors slippery. If you must use wax, use non-skid floor wax.  Do not have throw rugs and other things on the floor that can make you trip. What can I do with my stairs?  Do not leave any items on the stairs.  Make sure that there are handrails on both sides of the stairs and use  them. Fix handrails that are broken or loose. Make sure that handrails are as long as the stairways.  Check any carpeting to make sure that it is firmly attached to the stairs. Fix any carpet that is loose or worn.  Avoid having throw rugs at the top or bottom of the stairs. If you do have throw rugs, attach them to the floor with carpet tape.  Make sure that you have a light switch at the top of the stairs and the bottom of the stairs. If you do not have them, ask someone to add them for you. What else can I do to help prevent falls?  Wear shoes that:  Do not have high heels.  Have rubber bottoms.  Are comfortable and fit you well.  Are closed at the toe. Do not wear sandals.  If you use a stepladder:  Make sure that it is fully opened. Do not climb a closed stepladder.  Make sure that both sides of the stepladder are locked into place.  Ask someone to hold it for you, if possible.  Clearly mark and make sure that you can see:  Any grab bars or handrails.  First and last steps.  Where the edge of each step is.  Use tools that help you move around (mobility aids) if they are needed. These include:  Canes.  Walkers.  Scooters.  Crutches.  Turn on the lights when you go into a dark area. Replace any light bulbs as soon as they burn out.  Set up your furniture so you have a clear path. Avoid moving your furniture around.  If any of your floors are uneven, fix them.  If there are any pets around you, be aware of where they are.  Review your medicines with your doctor. Some medicines can make you feel dizzy. This can increase your chance of falling. Ask your doctor what other things that you can do to help prevent falls. This information is not intended to replace advice given to you by your health care provider. Make sure you discuss any questions you have with your health care provider. Document Released: 01/24/2009 Document Revised: 09/05/2015 Document Reviewed:  05/04/2014 Elsevier Interactive Patient Education  2017 [employer].

## 2020-04-24 ENCOUNTER — Ambulatory Visit (INDEPENDENT_AMBULATORY_CARE_PROVIDER_SITE_OTHER): Payer: Medicare Other | Admitting: Internal Medicine

## 2020-04-24 DIAGNOSIS — R059 Cough, unspecified: Secondary | ICD-10-CM

## 2020-04-24 DIAGNOSIS — J069 Acute upper respiratory infection, unspecified: Secondary | ICD-10-CM | POA: Diagnosis not present

## 2020-04-24 DIAGNOSIS — M329 Systemic lupus erythematosus, unspecified: Secondary | ICD-10-CM | POA: Diagnosis not present

## 2020-04-24 LAB — POCT INFLUENZA A/B
Influenza A, POC: NEGATIVE
Influenza B, POC: NEGATIVE

## 2020-04-24 NOTE — Progress Notes (Signed)
Name: Donna Haas   MRN: [id number]    DOB: 10-Mar-1976   Date:04/24/2020       Progress Note  Subjective  Chief Complaint  Chief Complaint  Patient presents with  . Headache  . Cough    X3 days  . Fever    100    I connected with  Donna Haas on 04/24/20 at 10:20 AM EST by telephone and verified that I am speaking with the correct person using two identifiers.  I discussed the limitations, risks, security and privacy concerns of performing an evaluation and management service by telephone and the availability of in person appointments. The patient expressed understanding and agreed to proceed. Staff also discussed with the patient that there may be a patient responsible charge related to this service. Patient Location: Home Provider Location: [facility] Additional Individuals present: none  HPI  Patient is a 45 year old female She reestablished with the practice in May 2021 Follows up today with the above complaints  COVID immunization status - had vaccine X 2, not yet have booster No recent exposures, has children who go to school Symptoms started yesterday  + cough, deep and raspy at times, + production of clear mucus, no blood No marked SOB + fever, low-grade and temp - 100 at highest,  No sore throat.  + mild congestion in sinuses, no sinus pains No loss of smell, loss of taste + N/No V + muscle aches, laying in bed + HA No marked loose stools/diarrhea No CP,  passing out episodes  Tried tylenol severe cold with expectorant Tob - never smoker Comorbid conditions reviewed + childhood asthma, noted again in late 20's and better since, no regular inhaler use No h/o DM,  + obesity  On disability for lupus, not taking immunosuppressants presently to manage  Had flu vaccine Staying isolated  Patient Active Problem List   Diagnosis Date Noted  . Anxiety, generalized 12/13/2019  . Binge eating disorder 08/23/2019  . Mild intermittent asthma without  complication 74/25/9563  . Hypothyroidism 08/23/2019  . Hypertriglyceridemia 02/07/2016  . Morbid obesity ([place]) 01/25/2016  . Abnormal weight gain 01/21/2016  . Barrett's esophagus determined by biopsy 01/21/2016  . Anemia 01/21/2016  . Fatigue 01/21/2016  . Carpal tunnel syndrome, bilateral 01/21/2016  . Preventative health care 01/21/2016  . Hx gestational diabetes 01/21/2016  . Cesarean delivery delivered 08/31/2015  . Headache 09/08/2012  . Neck pain 09/08/2012  . MOLE 02/22/2009  . INSOMNIA 09/21/2008  . FOOT SPRAIN 09/21/2008  . PANCREATITIS, HX OF 04/17/2008  . NEPHROLITHIASIS, HX OF 04/17/2008  . Anxiety state 02/16/2008  . THYROID NODULE 01/12/2008  . PARESTHESIA 12/14/2007  . OBSTRUCTIVE SLEEP APNEA 10/03/2007  . THYROID CYST 05/30/2007  . GERD 05/30/2007  . ENDOMETRIOSIS OF OTHER SPECIFIED SITES 05/30/2007  . Lupus erythematosus 05/30/2007  . HERNIATED LUMBAR [place] 05/30/2007  . BACK PAIN 05/30/2007  . PLANTAR FASCIITIS, BILATERAL 05/30/2007  . SNORING, HX OF 05/30/2007    Past Surgical History:  Procedure Laterality Date  . APPENDECTOMY    . CESAREAN SECTION     x2  . CESAREAN SECTION WITH BILATERAL TUBAL LIGATION Bilateral 08/30/2015   Procedure: CESAREAN SECTION WITH BILATERAL TUBAL LIGATION;  Surgeon: Honor Loh Ward, MD;  Location: [facility] ORS;  Service: Obstetrics;  Laterality: Bilateral;  . CHOLECYSTECTOMY    . LAPAROSCOPY     x3  . THYROID CYST EXCISION     Dr. Trena Platt, [facility]    Family History  Problem Relation Age of  Onset  . Depression Mother        Bi-polar  . Asthma Mother   . Allergies Mother   . Sleep apnea Mother   . Hyperlipidemia Other   . Hypertension Other   . Heart disease Other   . Allergies Sister   . Diabetes Sister   . Rheum arthritis Maternal Grandmother     Social History   Tobacco Use  . Smoking status: Never Smoker  . Smokeless tobacco: Never Used  Substance Use Topics  . Alcohol use: No    Alcohol/week: 1.0 -  2.0 standard drink    Types: 1 - 2 Standard drinks or equivalent per week    Comment: discontinued use during pregnancy     Current Outpatient Medications:  .  ALPRAZolam (XANAX) 1 MG tablet, Take 1 tablet by mouth at bedtime., Disp: , Rfl:  .  clonazePAM (KLONOPIN) 1 MG tablet, Take 1 mg by mouth 2 (two) times daily as needed. Two tablets at bedtime, Disp: , Rfl:  .  dextroamphetamine (DEXTROSTAT) 10 MG tablet, dextroamphetamine 10 mg tablet, Disp: , Rfl:  .  EPINEPHrine 0.3 mg/0.3 mL IJ SOAJ injection, epinephrine 0.3 mg/0.3 mL injection, auto-injector, Disp: , Rfl:  .  escitalopram (LEXAPRO) 20 MG tablet, Take 1 tablet by mouth daily., Disp: , Rfl:  .  esomeprazole (NEXIUM) 40 MG capsule, Take 20 mg by mouth daily. , Disp: , Rfl:  .  ferrous sulfate 325 (65 FE) MG tablet, ferrous sulfate 325 mg (65 mg iron) tablet  TAKE 1 TABLET BY MOUTH EVERY DAY, Disp: , Rfl:  .  ibuprofen (ADVIL) 800 MG tablet, Take 1 tablet by mouth in the morning and at bedtime., Disp: , Rfl:  .  ketorolac (TORADOL) 10 MG tablet, ketorolac 10 mg tablet  Take 1 tablet every 8 hours by oral route., Disp: , Rfl:  .  ketorolac (TORADOL) 30 MG/ML injection, ketorolac 30 mg/mL injection syringe  Inject 1 mL by intramuscular route., Disp: , Rfl:  .  loratadine (CLARITIN) 10 MG tablet, Take 10 mg by mouth daily., Disp: , Rfl:  .  zolpidem (AMBIEN) 10 MG tablet, Take 1 tablet by mouth at bedtime., Disp: , Rfl:   Allergies  Allergen Reactions  . Compazine [Prochlorperazine] Anaphylaxis  . Prednisone Anaphylaxis  . Prochlorperazine Edisylate Anaphylaxis  . Etodolac     Other reaction(s): Unknown  . Hydrocodone-Acetaminophen Nausea Only  . Morphine And Related Nausea Only and Other (See Comments)    Mood swings, headache    With staff assistance, above reviewed with the patient today.  ROS: As per HPI, otherwise no specific complaints on a limited and focused system review   Objective  Virtual encounter, vitals not  obtained.  There is no height or weight on file to calculate BMI.  Physical Exam   Appears in NAD via conversation, had an occasional cough during the phone conversation Pulmonary/Chest: No obvious respiratory distress. Speaking in complete sentences Neurological: Pt is alert, Speech is normal Psychiatric: Patient has a normal mood and affect, behavior is normal. Judgment and thought content normal.   No results found for this or any previous visit (from the past 72 hour(s)).  PHQ2/9: Depression screen [facility] 2/9 04/24/2020 12/12/2019 08/23/2019 01/21/2016 06/25/2015  Decreased Interest 0 0 0 0 0  Down, Depressed, Hopeless 0 0 0 0 0  PHQ - 2 Score 0 0 0 0 0  Altered sleeping - - 0 - -  Tired, decreased energy - - 0 - -  Change in appetite - - 1 - -  Feeling bad or failure about yourself  - - 0 - -  Trouble concentrating - - 0 - -  Moving slowly or fidgety/restless - - 0 - -  Suicidal thoughts - - 0 - -  PHQ-9 Score - - 1 - -  Difficult doing work/chores - - Not difficult at all - -   PHQ-2/9 Result reviewed  Fall Risk: Fall Risk  04/24/2020 12/12/2019 08/23/2019 01/21/2016 07/04/2015  Falls in the past year? 1 1 1  No No  Number falls in past yr: 0 1 0 - -  Injury with Fall? 0 0 0 - -  Risk for fall due to : - History of fall(s) - - -  Follow up - Falls prevention discussed - - -     Assessment & Plan 1. Upper respiratory tract infection, unspecified type 2. Cough    3.  Systemic lupus erythematosus 4. morbid obesity   Noted concerns for possible COVID with the patient today, and do feel getting tested is appropriate.  A COVID test was ordered and she can come to the office delayed later this afternoon and have the test done.  Also will get a influenza test with that in the differential as well.  Her symptoms have been present now for a couple days. Did briefly discussed the treatments available now for COVID that include antibody infusions and 2 oral pills, with them having  very limited availability, and triage based on supply and demand.  Did review her comorbidities, and she is not on any immunosuppressants presently to manage her lupus.  She also does not use any regular inhaler chronically for her asthma  Await testing results presently. Felt best to manage symptomatically presently She will continue the Tylenol products that include an expectorant, emphasized relative rest and staying well-hydrated, and continue to closely monitor her symptoms. Also needs to stay isolated presently, and will continue isolated until her symptoms significantly improved/resolved.  Emphasized the importance of following up if her symptoms not improving or more problematic over time and if her symptoms more acutely worsen, with increasing shortness of breath, chest pains, higher fevers, she needs to be seen more emergently in an ER setting.  If she has not contacted [place] before the weekend, will ask Crystal to call her on Monday for follow-up to reassess.  She is aware to follow-up sooner in the interim as above noted.  I discussed the assessment and treatment plan with the patient. The patient was provided an opportunity to ask questions and all were answered. The patient agreed with the plan and demonstrated an understanding of the instructions.  Red flags and when to present for emergency care or RTC including fever >101.85F, chest pain, shortness of breath, new/worsening/un-resolving symptoms reviewed with patient at time of visit.   The patient was advised to call back or seek an in-person evaluation if the symptoms worsen or if the condition fails to improve as anticipated.  I provided 20 minutes of non-face-to-face time during this encounter that included discussing at length patient's sx/history, pertinent pmhx, medications, treatment and follow up plan. This time also included the necessary documentation, orders, and chart review.  Towanda Malkin, MD

## 2020-04-24 NOTE — Addendum Note (Signed)
Addended by: Carlene Coria on: 04/24/2020 11:13 AM   Modules accepted: Orders

## 2020-04-25 NOTE — Progress Notes (Signed)
The patient was notified yesterday afternoon that her flu test was negative. Currently awaiting the COVID test results.

## 2020-04-26 ENCOUNTER — Ambulatory Visit: Payer: Self-pay | Admitting: Hematology

## 2020-04-26 LAB — NOVEL CORONAVIRUS, NAA: SARS-CoV-2, NAA: DETECTED — AB

## 2020-04-26 LAB — SARS-COV-2, NAA 2 DAY TAT

## 2020-04-26 NOTE — Telephone Encounter (Signed)
Patient notified of positive COVID-19 test results. Pt verbalized understanding. Pt reports mild cough Criteria for self-isolation if you test positive for COVID-19, regardless of vaccination status:  -If you have mild symptoms that are resolving or have resolved, isolate at home for 5 days since symptoms started AND continue to wear a well-fitted mask when around others in the home and in public for 5 additional days after isolation is completed -If you have a fever and/or moderate to severe symptoms, isolate for at least 10 days since the symptoms started AND until you are fever free for at least 24 hours without the use of fever-reducing medications -If you tested positive and did not have symptoms, isolate for at least 5 days after your positive test  Use over-the-counter medications for symptoms.If you develop respiratory issues/distress, seek medical care in the Emergency Department.  If you must leave home or if you have to be around others please wear a mask. Please limit contact with immediate family members in the home, practice social distancing, frequent handwashing and clean hard surfaces touched frequently with household cleaning products. Members of your household will also need to quarantine and test.You may also be contacted by the health department for follow up. [facility] Department notified.

## 2020-06-14 ENCOUNTER — Encounter: Payer: Self-pay | Admitting: Emergency Medicine

## 2020-06-14 ENCOUNTER — Emergency Department: Payer: Medicare Other

## 2020-06-14 ENCOUNTER — Inpatient Hospital Stay
Admission: EM | Admit: 2020-06-14 | Discharge: 2020-06-17 | DRG: 603 | Disposition: A | Discharge status: Home / Self Care | Payer: Medicare Other | Attending: Internal Medicine | Admitting: Internal Medicine

## 2020-06-14 ENCOUNTER — Other Ambulatory Visit: Payer: Self-pay

## 2020-06-14 DIAGNOSIS — M5126 Other intervertebral disc displacement, lumbar region: Secondary | ICD-10-CM | POA: Diagnosis present

## 2020-06-14 DIAGNOSIS — M329 Systemic lupus erythematosus, unspecified: Secondary | ICD-10-CM | POA: Diagnosis present

## 2020-06-14 DIAGNOSIS — J45909 Unspecified asthma, uncomplicated: Secondary | ICD-10-CM | POA: Diagnosis present

## 2020-06-14 DIAGNOSIS — F411 Generalized anxiety disorder: Secondary | ICD-10-CM | POA: Diagnosis present

## 2020-06-14 DIAGNOSIS — L03211 Cellulitis of face: Principal | ICD-10-CM

## 2020-06-14 DIAGNOSIS — R030 Elevated blood-pressure reading, without diagnosis of hypertension: Secondary | ICD-10-CM | POA: Diagnosis present

## 2020-06-14 DIAGNOSIS — Z825 Family history of asthma and other chronic lower respiratory diseases: Secondary | ICD-10-CM

## 2020-06-14 DIAGNOSIS — Z833 Family history of diabetes mellitus: Secondary | ICD-10-CM | POA: Diagnosis not present

## 2020-06-14 DIAGNOSIS — K219 Gastro-esophageal reflux disease without esophagitis: Secondary | ICD-10-CM | POA: Diagnosis present

## 2020-06-14 DIAGNOSIS — R739 Hyperglycemia, unspecified: Secondary | ICD-10-CM

## 2020-06-14 DIAGNOSIS — E1165 Type 2 diabetes mellitus with hyperglycemia: Secondary | ICD-10-CM | POA: Diagnosis present

## 2020-06-14 DIAGNOSIS — Z6838 Body mass index (BMI) 38.0-38.9, adult: Secondary | ICD-10-CM

## 2020-06-14 DIAGNOSIS — M722 Plantar fascial fibromatosis: Secondary | ICD-10-CM | POA: Diagnosis present

## 2020-06-14 DIAGNOSIS — Z888 Allergy status to other drugs, medicaments and biological substances status: Secondary | ICD-10-CM | POA: Diagnosis not present

## 2020-06-14 DIAGNOSIS — Z79899 Other long term (current) drug therapy: Secondary | ICD-10-CM

## 2020-06-14 DIAGNOSIS — E1169 Type 2 diabetes mellitus with other specified complication: Secondary | ICD-10-CM | POA: Diagnosis present

## 2020-06-14 DIAGNOSIS — R519 Headache, unspecified: Secondary | ICD-10-CM | POA: Diagnosis present

## 2020-06-14 DIAGNOSIS — Z9049 Acquired absence of other specified parts of digestive tract: Secondary | ICD-10-CM | POA: Diagnosis not present

## 2020-06-14 DIAGNOSIS — Z9851 Tubal ligation status: Secondary | ICD-10-CM

## 2020-06-14 DIAGNOSIS — F32A Depression, unspecified: Secondary | ICD-10-CM | POA: Diagnosis present

## 2020-06-14 DIAGNOSIS — Z20822 Contact with and (suspected) exposure to covid-19: Secondary | ICD-10-CM | POA: Diagnosis present

## 2020-06-14 DIAGNOSIS — E781 Pure hyperglyceridemia: Secondary | ICD-10-CM | POA: Diagnosis present

## 2020-06-14 DIAGNOSIS — E669 Obesity, unspecified: Secondary | ICD-10-CM | POA: Diagnosis present

## 2020-06-14 DIAGNOSIS — E039 Hypothyroidism, unspecified: Secondary | ICD-10-CM | POA: Diagnosis present

## 2020-06-14 DIAGNOSIS — F319 Bipolar disorder, unspecified: Secondary | ICD-10-CM | POA: Diagnosis present

## 2020-06-14 DIAGNOSIS — Z885 Allergy status to narcotic agent status: Secondary | ICD-10-CM | POA: Diagnosis not present

## 2020-06-14 LAB — CBC WITH DIFFERENTIAL/PLATELET
Abs Immature Granulocytes: 0.02 10*3/uL (ref 0.00–0.07)
Basophils Absolute: 0 10*3/uL (ref 0.0–0.1)
Basophils Relative: 1 %
Eosinophils Absolute: 0.2 10*3/uL (ref 0.0–0.5)
Eosinophils Relative: 2 %
HCT: 35.3 % — ABNORMAL LOW (ref 36.0–46.0)
Hemoglobin: 12 g/dL (ref 12.0–15.0)
Immature Granulocytes: 0 %
Lymphocytes Relative: 17 %
Lymphs Abs: 1.3 10*3/uL (ref 0.7–4.0)
MCH: 30.5 pg (ref 26.0–34.0)
MCHC: 34 g/dL (ref 30.0–36.0)
MCV: 89.6 fL (ref 80.0–100.0)
Monocytes Absolute: 0.6 10*3/uL (ref 0.1–1.0)
Monocytes Relative: 9 %
Neutro Abs: 5.4 10*3/uL (ref 1.7–7.7)
Neutrophils Relative %: 71 %
Platelets: 214 10*3/uL (ref 150–400)
RBC: 3.94 MIL/uL (ref 3.87–5.11)
RDW: 11.9 % (ref 11.5–15.5)
WBC: 7.6 10*3/uL (ref 4.0–10.5)
nRBC: 0 % (ref 0.0–0.2)

## 2020-06-14 LAB — BASIC METABOLIC PANEL WITH GFR
Anion gap: 8 (ref 5–15)
BUN: 15 mg/dL (ref 6–20)
CO2: 26 mmol/L (ref 22–32)
Calcium: 8.9 mg/dL (ref 8.9–10.3)
Chloride: 102 mmol/L (ref 98–111)
Creatinine, Ser: 0.66 mg/dL (ref 0.44–1.00)
GFR, Estimated: >60 mL/min
Glucose, Bld: 326 mg/dL — ABNORMAL HIGH (ref 70–99)
Potassium: 3.9 mmol/L (ref 3.5–5.1)
Sodium: 136 mmol/L (ref 135–145)

## 2020-06-14 LAB — GLUCOSE, CAPILLARY
Glucose-Capillary: 275 mg/dL — ABNORMAL HIGH (ref 70–99)
Glucose-Capillary: 205 mg/dL — ABNORMAL HIGH (ref 70–99)

## 2020-06-14 LAB — HEMOGLOBIN A1C
Hgb A1c MFr Bld: 12 % — ABNORMAL HIGH (ref 4.8–5.6)
Mean Plasma Glucose: 297.7 mg/dL

## 2020-06-14 LAB — CBG MONITORING, ED: Glucose-Capillary: 292 mg/dL — ABNORMAL HIGH (ref 70–99)

## 2020-06-14 LAB — LACTIC ACID, PLASMA: Lactic Acid, Venous: 1.8 mmol/L (ref 0.5–1.9)

## 2020-06-14 LAB — HIV ANTIBODY (ROUTINE TESTING W REFLEX): HIV Screen 4th Generation wRfx: NONREACTIVE

## 2020-06-14 LAB — RESP PANEL BY RT-PCR (FLU A&B, COVID) ARPGX2
Influenza A by PCR: NEGATIVE
Influenza B by PCR: NEGATIVE
SARS Coronavirus 2 by RT PCR: NEGATIVE

## 2020-06-14 MED ORDER — ESCITALOPRAM OXALATE 10 MG PO TABS
10.0000 mg | ORAL_TABLET | Freq: Every day | ORAL | Status: DC
  Administered 2020-06-14 – 2020-06-17 (×4): 10 mg via ORAL
  Filled 2020-06-14 (×4): qty 1

## 2020-06-14 MED ORDER — MORPHINE SULFATE (PF) 2 MG/ML IV SOLN: 2.0000 mg | INTRAVENOUS | Status: DC | PRN

## 2020-06-14 MED ORDER — CEFAZOLIN SODIUM-DEXTROSE 1-4 GM/50ML-% IV SOLN
1.0000 g | Freq: Three times a day (TID) | INTRAVENOUS | Status: DC
  Administered 2020-06-14 – 2020-06-15 (×4): 1 g via INTRAVENOUS
  Filled 2020-06-14 (×7): qty 50

## 2020-06-14 MED ORDER — ONDANSETRON HCL 4 MG/2ML IJ SOLN
4.0000 mg | Freq: Once | INTRAMUSCULAR | Status: AC
  Administered 2020-06-14: 4 mg via INTRAVENOUS
  Filled 2020-06-14: qty 2

## 2020-06-14 MED ORDER — CLONAZEPAM 1 MG PO TABS
1.0000 mg | ORAL_TABLET | Freq: Three times a day (TID) | ORAL | Status: DC | PRN
  Administered 2020-06-14 – 2020-06-16 (×3): 1 mg via ORAL
  Filled 2020-06-14 (×3): qty 1

## 2020-06-14 MED ORDER — HYDROMORPHONE HCL 1 MG/ML IJ SOLN
0.5000 mg | INTRAMUSCULAR | Status: DC | PRN
  Administered 2020-06-14 – 2020-06-15 (×3): 0.5 mg via INTRAVENOUS
  Filled 2020-06-14: qty 0.5
  Filled 2020-06-14: qty 1
  Filled 2020-06-14: qty 0.5

## 2020-06-14 MED ORDER — DEXTROAMPHETAMINE SULFATE 5 MG PO TABS
10.0000 mg | ORAL_TABLET | Freq: Every day | ORAL | Status: DC
  Filled 2020-06-14: qty 2

## 2020-06-14 MED ORDER — ENOXAPARIN SODIUM 40 MG/0.4ML ~~LOC~~ SOLN
40.0000 mg | SUBCUTANEOUS | Status: DC
  Administered 2020-06-15 – 2020-06-17 (×3): 40 mg via SUBCUTANEOUS
  Filled 2020-06-14 (×3): qty 0.4

## 2020-06-14 MED ORDER — FERROUS SULFATE 325 (65 FE) MG PO TABS
325.0000 mg | ORAL_TABLET | Freq: Every day | ORAL | Status: DC
  Administered 2020-06-15 – 2020-06-17 (×3): 325 mg via ORAL
  Filled 2020-06-14 (×4): qty 1

## 2020-06-14 MED ORDER — METOCLOPRAMIDE HCL 5 MG/ML IJ SOLN
10.0000 mg | Freq: Once | INTRAMUSCULAR | Status: AC
  Administered 2020-06-14: 10 mg via INTRAVENOUS
  Filled 2020-06-14: qty 2

## 2020-06-14 MED ORDER — HYDROMORPHONE HCL 1 MG/ML IJ SOLN
0.5000 mg | Freq: Once | INTRAMUSCULAR | Status: AC
  Administered 2020-06-14: 0.5 mg via INTRAVENOUS
  Filled 2020-06-14: qty 1

## 2020-06-14 MED ORDER — LORATADINE 10 MG PO TABS
10.0000 mg | ORAL_TABLET | Freq: Every day | ORAL | Status: DC
  Administered 2020-06-15 – 2020-06-17 (×3): 10 mg via ORAL
  Filled 2020-06-14 (×3): qty 1

## 2020-06-14 MED ORDER — SODIUM CHLORIDE 0.9 % IV BOLUS
1000.0000 mL | Freq: Once | INTRAVENOUS | Status: AC
  Administered 2020-06-14: 1000 mL via INTRAVENOUS

## 2020-06-14 MED ORDER — FENTANYL CITRATE (PF) 100 MCG/2ML IJ SOLN
50.0000 ug | Freq: Once | INTRAMUSCULAR | Status: AC
  Administered 2020-06-14: 50 ug via INTRAVENOUS
  Filled 2020-06-14: qty 2

## 2020-06-14 MED ORDER — SODIUM CHLORIDE 0.9 % IV SOLN: INTRAVENOUS | Status: DC

## 2020-06-14 MED ORDER — LISINOPRIL 5 MG PO TABS: 5.0000 mg | ORAL_TABLET | Freq: Every day | ORAL | Status: DC

## 2020-06-14 MED ORDER — HYDROCODONE-ACETAMINOPHEN 5-325 MG PO TABS
1.0000 | ORAL_TABLET | ORAL | Status: DC | PRN
  Administered 2020-06-14: 1 via ORAL
  Filled 2020-06-14: qty 1

## 2020-06-14 MED ORDER — ONDANSETRON HCL 4 MG/2ML IJ SOLN
4.0000 mg | Freq: Four times a day (QID) | INTRAMUSCULAR | Status: DC | PRN
  Administered 2020-06-14 – 2020-06-15 (×4): 4 mg via INTRAVENOUS
  Filled 2020-06-14 (×4): qty 2

## 2020-06-14 MED ORDER — IOHEXOL 300 MG/ML  SOLN
75.0000 mL | Freq: Once | INTRAMUSCULAR | Status: AC | PRN
  Administered 2020-06-14: 75 mL via INTRAVENOUS

## 2020-06-14 MED ORDER — ONDANSETRON HCL 4 MG PO TABS
4.0000 mg | ORAL_TABLET | Freq: Four times a day (QID) | ORAL | Status: DC | PRN
  Administered 2020-06-14 – 2020-06-16 (×2): 4 mg via ORAL
  Filled 2020-06-14 (×2): qty 1

## 2020-06-14 MED ORDER — INSULIN ASPART 100 UNIT/ML ~~LOC~~ SOLN
0.0000 [IU] | Freq: Three times a day (TID) | SUBCUTANEOUS | Status: DC
  Administered 2020-06-14 – 2020-06-15 (×2): 11 [IU] via SUBCUTANEOUS
  Administered 2020-06-15: 20 [IU] via SUBCUTANEOUS
  Filled 2020-06-14 (×4): qty 1

## 2020-06-14 MED ORDER — SODIUM CHLORIDE 0.9 % IV SOLN
3.0000 g | Freq: Once | INTRAVENOUS | Status: AC
  Administered 2020-06-14: 3 g via INTRAVENOUS
  Filled 2020-06-14: qty 8

## 2020-06-14 MED ORDER — PANTOPRAZOLE SODIUM 40 MG PO TBEC
40.0000 mg | DELAYED_RELEASE_TABLET | Freq: Every day | ORAL | Status: DC
  Administered 2020-06-15 – 2020-06-17 (×3): 40 mg via ORAL
  Filled 2020-06-14 (×3): qty 1

## 2020-06-14 NOTE — H&P (Addendum)
History and Physical    ZHOE CATANIA MEQ:[id number] DOB: [birth year] DOA: 06/14/2020  PCP: Towanda Malkin, MD (Inactive)   Patient coming from: Home  I have personally briefly reviewed patient's old medical records in [place]  Chief Complaint: Left-sided facial swelling  HPI: OLUWADEMILADE KELLETT is a 45 y.o. female with medical history significant for anxiety disorder, obesity, GERD and chronic low back pain who presents to the ER for evaluation of swelling involving the left side of her face.  Patient notes that she had a bump over her left cheek which she assumed was cystic acne that she occasionally gets.  She denies picking at her face or attempting to open up the bump.  Overnight she developed swelling involving the entire left side of her face with differential warmth and pain prompting her visit to the emergency room.  She denies having any fever or chills. She denies having any tooth ache or pain and has not had any recent extractions.  She denies having any chest pain, no shortness of breath, no nausea, no vomiting, no diaphoresis, no palpitations, no headache, no abdominal pain, no changes in her bowel habits, no dizziness, no lightheadedness, no cough, no blurred vision. Labs show sodium 136, potassium 3.9, chloride 1026, glucose 326, BUN 15, creatinine 0.6, calcium 8.9, lactic acid 1.8, white count 7.6, hemoglobin 12.0, hematocrit, MCV 89.6, RDW 11.9 Respiratory viral panel is negative Maxillofacial CT shows left facial cellulitis without underlying abscess.  ED Course: Patient is a 45 year old Caucasian female who presents to the ER for evaluation of swelling involving the left side of her face associated with redness and differential warmth.  Imaging is consistent with cellulitis.  Patient will be admitted to the hospital for further evaluation and treatment.  Review of Systems: As per HPI otherwise all other systems reviewed and negative.    Past Medical History:   Diagnosis Date  . Anxiety   . Back pain   . Endometriosis   . GERD (gastroesophageal reflux disease)   . Gestational diabetes   . Lumbar herniated disc   . Lupus ([place])   . Migraines   . Morbid obesity ([place]) 01/25/2016  . Nephrolithiasis   . Obesity   . Pancreatitis   . Plantar fasciitis   . Thyroid cyst     Past Surgical History:  Procedure Laterality Date  . APPENDECTOMY    . CESAREAN SECTION     x2  . CESAREAN SECTION WITH BILATERAL TUBAL LIGATION Bilateral 08/30/2015   Procedure: CESAREAN SECTION WITH BILATERAL TUBAL LIGATION;  Surgeon: Honor Loh Ward, MD;  Location: [facility] ORS;  Service: Obstetrics;  Laterality: Bilateral;  . CHOLECYSTECTOMY    . LAPAROSCOPY     x3  . THYROID CYST EXCISION     Dr. Trena Platt, [place]     reports that she has never smoked. She has never used smokeless tobacco. She reports that she does not drink alcohol and does not use drugs.  Allergies  Allergen Reactions  . Compazine [Prochlorperazine] Anaphylaxis  . Prednisone Anaphylaxis  . Prochlorperazine Edisylate Anaphylaxis  . Etodolac     Other reaction(s): Unknown  . Hydrocodone-Acetaminophen Nausea Only  . Morphine And Related Nausea Only and Other (See Comments)    Mood swings, headache    Family History  Problem Relation Age of Onset  . Depression Mother        Bi-polar  . Asthma Mother   . Allergies Mother   . Sleep apnea Mother   .  Hyperlipidemia Other   . Hypertension Other   . Heart disease Other   . Allergies Sister   . Diabetes Sister   . Rheum arthritis Maternal Grandmother   She has some blood pressure issues to suggest pain something is not right    Prior to Admission medications   Medication Sig Start Date End Date Taking? Authorizing Provider  ALPRAZolam Duanne Moron) 1 MG tablet Take 1 tablet by mouth at bedtime. 11/15/19   [provider]  clonazePAM (KLONOPIN) 1 MG tablet Take 1 mg by mouth 2 (two) times daily as needed. Two tablets at bedtime    [provider]  dextroamphetamine (DEXTROSTAT) 10 MG tablet dextroamphetamine 10 mg tablet 05/30/19   [provider]  EPINEPHrine 0.3 mg/0.3 mL IJ SOAJ injection epinephrine 0.3 mg/0.3 mL injection, auto-injector    [provider]  escitalopram (LEXAPRO) 20 MG tablet Take 1 tablet by mouth daily. 11/23/19   [provider]  esomeprazole (NEXIUM) 40 MG capsule Take 20 mg by mouth daily.     [provider]  ferrous sulfate 325 (65 FE) MG tablet ferrous sulfate 325 mg (65 mg iron) tablet  TAKE 1 TABLET BY MOUTH EVERY DAY    [provider]  ibuprofen (ADVIL) 800 MG tablet Take 1 tablet by mouth in the morning and at bedtime.    [provider]  ketorolac (TORADOL) 10 MG tablet ketorolac 10 mg tablet  Take 1 tablet every 8 hours by oral route.    [provider]  ketorolac (TORADOL) 30 MG/ML injection ketorolac 30 mg/mL injection syringe  Inject 1 mL by intramuscular route.    [provider]  loratadine (CLARITIN) 10 MG tablet Take 10 mg by mouth daily.    [provider]  zolpidem (AMBIEN) 10 MG tablet Take 1 tablet by mouth at bedtime. 11/26/19   [provider]    Physical Exam: Vitals:   06/14/20 0324 06/14/20 0325 06/14/20 0430 06/14/20 0515  BP: (!) 149/100  139/81 124/78  Pulse: 81  75 72  Resp: 18  20 (!) 23  Temp: 97.9 F (36.6 C)     TempSrc: Oral     SpO2: 98%  98% 98%  Weight:  90.3 kg    Height:  5' (1.524 m)       Vitals:   06/14/20 0324 06/14/20 0325 06/14/20 0430 06/14/20 0515  BP: (!) 149/100  139/81 124/78  Pulse: 81  75 72  Resp: 18  20 (!) 23  Temp: 97.9 F (36.6 C)     TempSrc: Oral     SpO2: 98%  98% 98%  Weight:  90.3 kg    Height:  5' (1.524 m)        Constitutional: Alert and oriented x 3 .  Swelling involving the left side of her face.  Appears comfortable and in no distress HEENT:      Head: Normocephalic and atraumatic.         Eyes: PERLA, EOMI,  Conjunctivae are normal. Sclera is non-icteric.       Mouth/Throat: Mucous membranes are moist.       Neck: Supple with no signs of meningismus. Cardiovascular: Regular rate and rhythm. No murmurs, gallops, or rubs. 2+ symmetrical distal pulses are present . No JVD. No LE edema Respiratory: Respiratory effort normal .Lungs sounds clear bilaterally. No wheezes, crackles, or rhonchi.  Gastrointestinal: Soft, non tender, and non distended with positive bowel sounds.  Genitourinary: No CVA tenderness. Musculoskeletal: Nontender  with normal range of motion in all extremities. No cyanosis, or erythema of extremities. Neurologic:  Face is symmetric. Moving all extremities. No gross focal neurologic deficits  Skin: Skin is warm, dry.  No rash or ulcers.  Differential warmth over the left side of her face Psychiatric: Mood and affect are normal   Labs on Admission: I have personally reviewed following labs and imaging studies  CBC: Recent Labs  Lab 06/14/20 0415  WBC 7.6  NEUTROABS 5.4  HGB 12.0  HCT 35.3*  MCV 89.6  PLT 284   Basic Metabolic Panel: Recent Labs  Lab 06/14/20 0415  NA 136  K 3.9  CL 102  CO2 26  GLUCOSE 326*  BUN 15  CREATININE 0.66  CALCIUM 8.9   GFR: Estimated Creatinine Clearance: 91.7 mL/min (by C-G formula based on SCr of 0.66 mg/dL). Liver Function Tests: No results for input(s): AST, ALT, ALKPHOS, BILITOT, PROT, ALBUMIN in the last 168 hours. No results for input(s): LIPASE, AMYLASE in the last 168 hours. No results for input(s): AMMONIA in the last 168 hours. Coagulation Profile: No results for input(s): INR, PROTIME in the last 168 hours. Cardiac Enzymes: No results for input(s): CKTOTAL, CKMB, CKMBINDEX, TROPONINI in the last 168 hours. BNP (last 3 results) No results for input(s): PROBNP in the last 8760 hours. HbA1C: No results for input(s): HGBA1C in the last 72 hours. CBG: No results for input(s): GLUCAP in the last 168 hours. Lipid  Profile: No results for input(s): CHOL, HDL, LDLCALC, TRIG, CHOLHDL, LDLDIRECT in the last 72 hours. Thyroid Function Tests: No results for input(s): TSH, T4TOTAL, FREET4, T3FREE, THYROIDAB in the last 72 hours. Anemia Panel: No results for input(s): VITAMINB12, FOLATE, FERRITIN, TIBC, IRON, RETICCTPCT in the last 72 hours. Urine analysis:    Component Value Date/Time   COLORURINE YELLOW (A) 07/20/2015 0117   APPEARANCEUR CLEAR (A) 07/20/2015 0117   LABSPEC 1.013 07/20/2015 0117   PHURINE 6.0 07/20/2015 0117   GLUCOSEU NEGATIVE 07/20/2015 0117   HGBUR NEGATIVE 07/20/2015 0117   BILIRUBINUR NEGATIVE 07/20/2015 0117   KETONESUR NEGATIVE 07/20/2015 0117   PROTEINUR NEGATIVE 07/20/2015 0117   UROBILINOGEN 0.2 09/08/2012 1245   NITRITE NEGATIVE 07/20/2015 0117   LEUKOCYTESUR NEGATIVE 07/20/2015 0117    Radiological Exams on Admission: CT Maxillofacial W Contrast  Result Date: 06/14/2020 CLINICAL DATA:  Left-sided facial cellulitis, acute onset EXAM: CT MAXILLOFACIAL WITH CONTRAST TECHNIQUE: Multidetector CT imaging of the maxillofacial structures was performed with intravenous contrast. Multiplanar CT image reconstructions were also generated. CONTRAST:  59mL OMNIPAQUE IOHEXOL 300 MG/ML  SOLN COMPARISON:  None. FINDINGS: Osseous: No fracture or mandibular dislocation. No destructive process. Orbits: Negative. No traumatic or inflammatory finding. Sinuses: Clear Soft tissues: Left facial fat expansion and swelling without visible underlying odontogenic abscess or salivary disease. The left facial vein is enhancing. No soft tissue emphysema Limited intracranial: Negative Other: Artifact from dental amalgam. IMPRESSION: Left facial cellulitis without underlying abscess. Electronically Signed   By: Monte Fantasia M.D.   On: 06/14/2020 06:07     Assessment/Plan Principal Problem:   Facial cellulitis Active Problems:   GERD   Anxiety, generalized   Diabetes mellitus with hyperglycemia  ([place])    Facial cellulitis Patient presents for swelling, redness and differential warmth involving the left side of her face Imaging shows cellulitis without evidence of abscess We will start patient on Ancef adjusted to renal function Follow-up results of blood cultures    Diabetes mellitus with hyperglycemia New onset Patient has multiple risk  factors including history of gestational diabetes and obesity Random blood sugar over 300g/dl We will place patient on a consistent carbohydrate diet Obtain hemoglobin A1c levels Glycemic control with sliding scale insulin IV fluid hydration with normal saline    Hypertension No known history of hypertension Blood pressure is elevated and may be related to pain Will monitor blood pressure closely during this hospitalization patient may need to be started on antihypertensive    Depression and anxiety Continue Klonopin and Lexapro    GERD Continue Protonix   Obesity (BMI 71.1) Complicates overall prognosis and care   DVT prophylaxis: Lovenox Code Status: full code Family Communication: Greater than 50% of time was spent discussing patient's condition with her and her father at the bedside.  All questions and concerns have been addressed.  They verbalized understanding and agree with the plan.  Disposition Plan: Back to previous home environment Consults called: Diabetic educator Status: Inpatient.  The medical decision making for this patient was of high complexity and patient is at high risk for clinical deterioration during this hospitalization.    Collier Bullock MD [facility]     06/14/2020, 9:08 AM

## 2020-06-14 NOTE — ED Notes (Addendum)
Assisted patient to restroom.

## 2020-06-14 NOTE — ED Provider Notes (Signed)
[facility] Department Provider Note   ____________________________________________   Event Date/Time   First MD Initiated Contact with Patient 06/14/20 [phone]     (approximate)  I have reviewed the triage vital signs and the nursing notes.   HISTORY  Chief Complaint Cellulitis    HPI EDITHE DOBBIN is a 45 y.o. female who presents to the ED from home with a chief complaint of left facial swelling.  Patient noted a pimple above her left upper lip last night around 7 PM.  Awoke prior to arrival to use the restroom and noted swelling to the left side of her face.  Denies difficulty swallowing or breathing.  Denies fever, chills, cough, chest pain, shortness of breath, abdominal pain, nausea or vomiting.  Patient is vaccinated against COVID-19     Past Medical History:  Diagnosis Date  . Anxiety   . Back pain   . Endometriosis   . GERD (gastroesophageal reflux disease)   . Gestational diabetes   . Lumbar herniated disc   . Lupus ([place])   . Migraines   . Morbid obesity ([place]) 01/25/2016  . Nephrolithiasis   . Obesity   . Pancreatitis   . Plantar fasciitis   . Thyroid cyst     Patient Active Problem List   Diagnosis Date Noted  . Anxiety, generalized 12/13/2019  . Binge eating disorder 08/23/2019  . Mild intermittent asthma without complication 42/70/6237  . Hypothyroidism 08/23/2019  . Hypertriglyceridemia 02/07/2016  . Morbid obesity ([place]) 01/25/2016  . Abnormal weight gain 01/21/2016  . Barrett's esophagus determined by biopsy 01/21/2016  . Anemia 01/21/2016  . Fatigue 01/21/2016  . Carpal tunnel syndrome, bilateral 01/21/2016  . Preventative health care 01/21/2016  . Hx gestational diabetes 01/21/2016  . Cesarean delivery delivered 08/31/2015  . Headache 09/08/2012  . Neck pain 09/08/2012  . MOLE 02/22/2009  . INSOMNIA 09/21/2008  . FOOT SPRAIN 09/21/2008  . PANCREATITIS, HX OF 04/17/2008  . NEPHROLITHIASIS, HX OF  04/17/2008  . Anxiety state 02/16/2008  . THYROID NODULE 01/12/2008  . PARESTHESIA 12/14/2007  . OBSTRUCTIVE SLEEP APNEA 10/03/2007  . THYROID CYST 05/30/2007  . GERD 05/30/2007  . ENDOMETRIOSIS OF OTHER SPECIFIED SITES 05/30/2007  . Lupus erythematosus 05/30/2007  . HERNIATED LUMBAR [place] 05/30/2007  . BACK PAIN 05/30/2007  . PLANTAR FASCIITIS, BILATERAL 05/30/2007  . SNORING, HX OF 05/30/2007    Past Surgical History:  Procedure Laterality Date  . APPENDECTOMY    . CESAREAN SECTION     x2  . CESAREAN SECTION WITH BILATERAL TUBAL LIGATION Bilateral 08/30/2015   Procedure: CESAREAN SECTION WITH BILATERAL TUBAL LIGATION;  Surgeon: Honor Loh Ward, MD;  Location: [facility] ORS;  Service: Obstetrics;  Laterality: Bilateral;  . CHOLECYSTECTOMY    . LAPAROSCOPY     x3  . THYROID CYST EXCISION     Dr. Trena Platt, [place]    Prior to Admission medications   Medication Sig Start Date End Date Taking? Authorizing Provider  ALPRAZolam Duanne Moron) 1 MG tablet Take 1 tablet by mouth at bedtime. 11/15/19   [provider]  clonazePAM (KLONOPIN) 1 MG tablet Take 1 mg by mouth 2 (two) times daily as needed. Two tablets at bedtime    [provider]  dextroamphetamine (DEXTROSTAT) 10 MG tablet dextroamphetamine 10 mg tablet 05/30/19   [provider]  EPINEPHrine 0.3 mg/0.3 mL IJ SOAJ injection epinephrine 0.3 mg/0.3 mL injection, auto-injector    [provider]  escitalopram (LEXAPRO) 20 MG tablet Take 1  tablet by mouth daily. 11/23/19   [provider]  esomeprazole (NEXIUM) 40 MG capsule Take 20 mg by mouth daily.     [provider]  ferrous sulfate 325 (65 FE) MG tablet ferrous sulfate 325 mg (65 mg iron) tablet  TAKE 1 TABLET BY MOUTH EVERY DAY    [provider]  ibuprofen (ADVIL) 800 MG tablet Take 1 tablet by mouth in the morning and at bedtime.    [provider]  ketorolac (TORADOL) 10 MG tablet ketorolac 10 mg tablet  Take 1  tablet every 8 hours by oral route.    [provider]  ketorolac (TORADOL) 30 MG/ML injection ketorolac 30 mg/mL injection syringe  Inject 1 mL by intramuscular route.    [provider]  loratadine (CLARITIN) 10 MG tablet Take 10 mg by mouth daily.    [provider]  zolpidem (AMBIEN) 10 MG tablet Take 1 tablet by mouth at bedtime. 11/26/19   [provider]    Allergies Compazine [prochlorperazine], Prednisone, Prochlorperazine edisylate, Etodolac, Hydrocodone-acetaminophen, and Morphine and related  Family History  Problem Relation Age of Onset  . Depression Mother        Bi-polar  . Asthma Mother   . Allergies Mother   . Sleep apnea Mother   . Hyperlipidemia Other   . Hypertension Other   . Heart disease Other   . Allergies Sister   . Diabetes Sister   . Rheum arthritis Maternal Grandmother     Social History Social History   Tobacco Use  . Smoking status: Never Smoker  . Smokeless tobacco: Never Used  Vaping Use  . Vaping Use: Never used  Substance Use Topics  . Alcohol use: No    Alcohol/week: 1.0 - 2.0 standard drink    Types: 1 - 2 Standard drinks or equivalent per week    Comment: discontinued use during pregnancy  . Drug use: No    Review of Systems  Constitutional: No fever/chills Eyes: No visual changes. ENT: Positive for left facial swelling.  No sore throat. Cardiovascular: Denies chest pain. Respiratory: Denies shortness of breath. Gastrointestinal: No abdominal pain.  No nausea, no vomiting.  No diarrhea.  No constipation. Genitourinary: Negative for dysuria. Musculoskeletal: Negative for back pain. Skin: Negative for rash. Neurological: Negative for headaches, focal weakness or numbness.   ____________________________________________   PHYSICAL EXAM:  VITAL SIGNS: ED Triage Vitals  Enc Vitals Group     BP 06/14/20 0324 (!) 149/100     Pulse Rate 06/14/20 0324 81     Resp 06/14/20 0324 18     Temp  06/14/20 0324 97.9 F (36.6 C)     Temp Source 06/14/20 0324 Oral     SpO2 06/14/20 0324 98 %     Weight 06/14/20 0325 199 lb (90.3 kg)     Height 06/14/20 0325 5' (1.524 m)     Head Circumference --      Peak Flow --      Pain Score 06/14/20 0325 8     Pain Loc --      Pain Edu? --      Excl. in [place]? --     Constitutional: Alert and oriented. Well appearing and in mild acute distress. Eyes: Conjunctivae are normal. PERRL. EOMI. Head: Atraumatic. Face: Small scab noted above left upper lip with moderate swelling to left face with warmth.  See picture below:    Nose: No congestion/rhinnorhea. Mouth/Throat: Mucous membranes are moist.  Oropharynx non-erythematous.  No dental abscess noted.  There is no hoarse or muffled voice.  There is no drooling.  Tolerating secretions well. Neck: No stridor.   Cardiovascular: Normal rate, regular rhythm. Grossly normal heart sounds.  Good peripheral circulation. Respiratory: Normal respiratory effort.  No retractions. Lungs CTAB. Gastrointestinal: Soft and nontender. No distention. No abdominal bruits. No CVA tenderness. Musculoskeletal: No lower extremity tenderness nor edema.  No joint effusions. Neurologic:  Normal speech and language. No gross focal neurologic deficits are appreciated. No gait instability. Skin:  Skin is warm, dry and intact. No rash noted. Psychiatric: Mood and affect are normal. Speech and behavior are normal.  ____________________________________________   LABS (all labs ordered are listed, but only abnormal results are displayed)  Labs Reviewed  CBC WITH DIFFERENTIAL/PLATELET - Abnormal; Notable for the following components:      Result Value   HCT 35.3 (*)    All other components within normal limits  BASIC METABOLIC PANEL - Abnormal; Notable for the following components:   Glucose, Bld 326 (*)    All other components within normal limits  RESP PANEL BY RT-PCR (FLU A&B, COVID) ARPGX2  CULTURE, BLOOD (ROUTINE X  2)  CULTURE, BLOOD (ROUTINE X 2)  LACTIC ACID, PLASMA   ____________________________________________  EKG  None ____________________________________________  RADIOLOGY I, Johanna Matto J, personally viewed and evaluated these images (plain radiographs) as part of my medical decision making, as well as reviewing the written report by the radiologist.  ED MD interpretation: Facial cellulitis without abscess  Official radiology report(s): CT Maxillofacial W Contrast  Result Date: 06/14/2020 CLINICAL DATA:  Left-sided facial cellulitis, acute onset EXAM: CT MAXILLOFACIAL WITH CONTRAST TECHNIQUE: Multidetector CT imaging of the maxillofacial structures was performed with intravenous contrast. Multiplanar CT image reconstructions were also generated. CONTRAST:  52mL OMNIPAQUE IOHEXOL 300 MG/ML  SOLN COMPARISON:  None. FINDINGS: Osseous: No fracture or mandibular dislocation. No destructive process. Orbits: Negative. No traumatic or inflammatory finding. Sinuses: Clear Soft tissues: Left facial fat expansion and swelling without visible underlying odontogenic abscess or salivary disease. The left facial vein is enhancing. No soft tissue emphysema Limited intracranial: Negative Other: Artifact from dental amalgam. IMPRESSION: Left facial cellulitis without underlying abscess. Electronically Signed   By: Monte Fantasia M.D.   On: 06/14/2020 06:07    ____________________________________________   PROCEDURES  Procedure(s) performed (including Critical Care):  Procedures   ____________________________________________   INITIAL IMPRESSION / ASSESSMENT AND PLAN / ED COURSE  As part of my medical decision making, I reviewed the following data within the [place] History obtained from family, Nursing notes reviewed and incorporated, Labs reviewed, Radiograph reviewed and Notes from prior ED visits     45 year old female presenting with left facial swelling.  Differential  diagnosis includes but is not limited to abscess, cellulitis, parotitis, sialolithiasis, etc.  We will obtain lab work to include blood cultures and lactic acid, obtain CT maxillofacial with contrast.  Initiate IV antibiotics, analgesia and reassess.  Clinical Course as of 06/14/20 5093  Fri Jun 14, 2020  0423 Patient notes she was Covid +36-month ago. [JS]  [phone] Updated patient and father of CT imaging results.  Complains of persistent pain.  Will discuss with hospital services for admission [JS]    Clinical Course User Index [JS] Paulette Blanch, MD     ____________________________________________   FINAL CLINICAL IMPRESSION(S) / ED DIAGNOSES  Final diagnoses:  Facial cellulitis  Hyperglycemia     ED Discharge Orders    None      *  Please note:  CALVINA LIPTAK was evaluated in Emergency Department on 06/14/2020 for the symptoms described in the history of present illness. She was evaluated in the context of the global COVID-19 pandemic, which necessitated consideration that the patient might be at risk for infection with the SARS-CoV-2 virus that causes COVID-19. Institutional protocols and algorithms that pertain to the evaluation of patients at risk for COVID-19 are in a state of rapid change based on information released by regulatory bodies including the CDC and federal and state organizations. These policies and algorithms were followed during the patient's care in the ED.  Some ED evaluations and interventions may be delayed as a result of limited staffing during and the pandemic.*   Note:  This document was prepared using Dragon voice recognition software and may include unintentional dictation errors.   Paulette Blanch, MD 06/14/20 [phone]

## 2020-06-14 NOTE — ED Notes (Signed)
Pt provided ginger ale as requested.

## 2020-06-14 NOTE — ED Notes (Signed)
Pharmacy contacted to adjust medications times. Pt states she takes all meds at night except for lexapro

## 2020-06-14 NOTE — Progress Notes (Addendum)
Met with pt down in the ED to discuss new diagnosis of Diabetes (see earlier note for conversation details).  Pt will need CBG meter at time of discharge home: Order [id number]  Also, pt states she has taken Metformin in the past for PCOS and is willing to take again if needed at time of discharge home for glucose control.     --Will follow patient during hospitalization--  Wyn Quaker RN, MSN, CDE Diabetes Coordinator Inpatient Glycemic Control Team Team Pager: [phone] (8a-5p)

## 2020-06-14 NOTE — ED Notes (Signed)
Drew up 11 units of insulin but yet to give as lunch tray still missing and pt states has never taken insulin before. Uncomfortable giving pt insulin without food tray at bedside.

## 2020-06-14 NOTE — Progress Notes (Addendum)
Inpatient Diabetes Program Recommendations  AACE/ADA: New Consensus Statement on Inpatient Glycemic Control (2015)  Target Ranges:  Prepandial:   less than 140 mg/dL      Peak postprandial:   less than 180 mg/dL (1-2 hours)      Critically ill patients:  140 - 180 mg/dL   Results for FIA, HEBERT (MRN [id number]) as of 06/14/2020 09:45  Ref. Range 06/14/2020 04:15  Glucose Latest Ref Range: 70 - 99 mg/dL 326 (H)    Admit with: Facial Cellulitis/ New Diagnosis of Diabetes  History: Gestational Diabetes  Current Orders: Novolog Resistant Correction Scale/ SSI (0-20 units) TID AC    Current A1c level pending  PCP: Dr. Lebron Conners ([facility])--last seen 04/24/2020  Note Novolog SSi to start at 12pm today  Plan to speak with pt about her glucose and new diagnosis of diabetes today--Will order educational materials as well    Addendum 12pm--Met with pt down in the ED to review new diagnosis of DM.  Pt told me she had GDM 5 years ago with her last pregnancy.  Developed GDM about 30 weeks into pregnancy and checked CBGs and took Glipizide until the end pf her pregnancy.  Pt told me she followed up with her PCP postpartum and GDM had resolved.  Stated to me she sees PCP yearly and gets checked for diabetes--no issues up until now.  Has been trying to lose weight and told me she has intentionally lost 35 pounds since Sept 2021.  Knows what an A1c is and states she has basic knowledge of diabetes due to her GDM and her sister also has diabetes.  Feels comfortable checking CBGs at home.  Will need CBG meter.  Spoke with pt about new diagnosis.  Discussed basic pathophysiology of DM Type 2, basic home care, basic diabetes diet nutrition principles, importance of checking CBGs and maintaining good CBG control to prevent long-term and short-term complications.  Also reviewed blood sugar goals and A1c goals for home.  Asked pt to check her CBGs at least TID at home for the  next several weeks to get some baseline CBG data for her PCP.  Asked pt to check either before a meal or two hours after (reviewed CBG goals for home).  Strongly encouraged pt to follow up with PCP after d/c to further discuss new diagnosis.  Also discussed with pt that depending on her A1c results, the MD may decide to send pt home on diabetes meds.  Explained that Metformin is common 1st line drug of choice--pt stated she has taken Metformin before for PCOS and tolerated the med well.  Explained what Metformin is and how it works and possible side effects.  RNs to provide ongoing basic DM education at bedside with this patient.  Have ordered educational booklet.  Have also placed RD consult for DM diet education for this patient.      --Will follow patient during hospitalization--  Wyn Quaker RN, MSN, CDE Diabetes Coordinator Inpatient Glycemic Control Team Team Pager: [phone] (8a-5p)

## 2020-06-14 NOTE — ED Triage Notes (Signed)
Patient ambulatory to triage with steady gait, without difficulty or distress noted; pt st noted bump to left side upper lip last night; awoke with large amount swelling to left side face

## 2020-06-14 NOTE — ED Notes (Signed)
Pt given ice water and cold washcloth as requested

## 2020-06-14 NOTE — ED Notes (Signed)
Pt reporting 10/10 facial pain and is requesting pain meds. Stated the vicodin hasn't helped as she is used to taking pain meds for her back. Provider Agbata messaged via secure chat. Awaiting orders. Pt resting calmly in bed, resp reg/unlabored, skin dry.

## 2020-06-14 NOTE — ED Notes (Signed)
Pt ambulatory to restroom, NAD noted. Continuing to c/o pain to facial area. Dr Bonita Quin via secure chat to see about placing prn orders

## 2020-06-14 NOTE — ED Notes (Signed)
Pt ambulatory to restroom

## 2020-06-14 NOTE — ED Notes (Signed)
Pt laying calmly in bed. Alert. Visitor at bedside. Bed locked low. Rail up. Call bell within reach.

## 2020-06-15 DIAGNOSIS — E1165 Type 2 diabetes mellitus with hyperglycemia: Secondary | ICD-10-CM

## 2020-06-15 DIAGNOSIS — E669 Obesity, unspecified: Secondary | ICD-10-CM

## 2020-06-15 DIAGNOSIS — M329 Systemic lupus erythematosus, unspecified: Secondary | ICD-10-CM

## 2020-06-15 DIAGNOSIS — L03211 Cellulitis of face: Principal | ICD-10-CM

## 2020-06-15 LAB — BASIC METABOLIC PANEL WITH GFR
Anion gap: 11 (ref 5–15)
BUN: 7 mg/dL (ref 6–20)
CO2: 26 mmol/L (ref 22–32)
Calcium: 9 mg/dL (ref 8.9–10.3)
Chloride: 99 mmol/L (ref 98–111)
Creatinine, Ser: 0.65 mg/dL (ref 0.44–1.00)
GFR, Estimated: >60 mL/min
Glucose, Bld: 245 mg/dL — ABNORMAL HIGH (ref 70–99)
Potassium: 4.1 mmol/L (ref 3.5–5.1)
Sodium: 136 mmol/L (ref 135–145)

## 2020-06-15 LAB — CBC
HCT: 36.6 % (ref 36.0–46.0)
Hemoglobin: 12.2 g/dL (ref 12.0–15.0)
MCH: 30.4 pg (ref 26.0–34.0)
MCHC: 33.3 g/dL (ref 30.0–36.0)
MCV: 91.3 fL (ref 80.0–100.0)
Platelets: 198 10*3/uL (ref 150–400)
RBC: 4.01 MIL/uL (ref 3.87–5.11)
RDW: 11.9 % (ref 11.5–15.5)
WBC: 10.7 10*3/uL — ABNORMAL HIGH (ref 4.0–10.5)
nRBC: 0 % (ref 0.0–0.2)

## 2020-06-15 LAB — GLUCOSE, RANDOM: Glucose, Bld: 467 mg/dL — ABNORMAL HIGH (ref 70–99)

## 2020-06-15 LAB — GLUCOSE, CAPILLARY
Glucose-Capillary: 294 mg/dL — ABNORMAL HIGH (ref 70–99)
Glucose-Capillary: 372 mg/dL — ABNORMAL HIGH (ref 70–99)
Glucose-Capillary: 472 mg/dL — ABNORMAL HIGH (ref 70–99)
Glucose-Capillary: 407 mg/dL — ABNORMAL HIGH (ref 70–99)

## 2020-06-15 MED ORDER — DEXAMETHASONE SODIUM PHOSPHATE 10 MG/ML IJ SOLN: 4.0000 mg | Freq: Two times a day (BID) | INTRAMUSCULAR | Status: DC

## 2020-06-15 MED ORDER — VANCOMYCIN HCL 1500 MG/300ML IV SOLN
1500.0000 mg | Freq: Once | INTRAVENOUS | Status: AC
  Administered 2020-06-15: 1500 mg via INTRAVENOUS
  Filled 2020-06-15: qty 300

## 2020-06-15 MED ORDER — INSULIN GLARGINE 100 UNIT/ML ~~LOC~~ SOLN
15.0000 [IU] | Freq: Every day | SUBCUTANEOUS | Status: DC
  Administered 2020-06-15: 15 [IU] via SUBCUTANEOUS
  Filled 2020-06-15 (×2): qty 0.15

## 2020-06-15 MED ORDER — VANCOMYCIN HCL 1000 MG/200ML IV SOLN
1000.0000 mg | Freq: Once | INTRAVENOUS | Status: DC
  Filled 2020-06-15: qty 200

## 2020-06-15 MED ORDER — FLUCONAZOLE 50 MG PO TABS
150.0000 mg | ORAL_TABLET | Freq: Once | ORAL | Status: AC
  Administered 2020-06-15: 150 mg via ORAL
  Filled 2020-06-15: qty 1

## 2020-06-15 MED ORDER — OXYCODONE HCL 5 MG PO TABS
5.0000 mg | ORAL_TABLET | ORAL | Status: DC | PRN
  Administered 2020-06-15 – 2020-06-16 (×2): 5 mg via ORAL
  Filled 2020-06-15 (×2): qty 1

## 2020-06-15 MED ORDER — DEXAMETHASONE SODIUM PHOSPHATE 10 MG/ML IJ SOLN
10.0000 mg | Freq: Once | INTRAMUSCULAR | Status: AC
  Administered 2020-06-15: 10 mg via INTRAVENOUS
  Filled 2020-06-15: qty 1

## 2020-06-15 MED ORDER — HYDROMORPHONE HCL 1 MG/ML IJ SOLN
0.5000 mg | INTRAMUSCULAR | Status: DC | PRN
  Administered 2020-06-15 (×2): 0.5 mg via INTRAVENOUS
  Filled 2020-06-15 (×2): qty 0.5

## 2020-06-15 MED ORDER — VANCOMYCIN HCL 750 MG/150ML IV SOLN
750.0000 mg | Freq: Two times a day (BID) | INTRAVENOUS | Status: DC
  Administered 2020-06-15 – 2020-06-17 (×4): 750 mg via INTRAVENOUS
  Filled 2020-06-15 (×5): qty 150

## 2020-06-15 MED ORDER — SODIUM CHLORIDE 0.9 % IV SOLN
2.0000 g | Freq: Three times a day (TID) | INTRAVENOUS | Status: DC
  Administered 2020-06-15 – 2020-06-17 (×6): 2 g via INTRAVENOUS
  Filled 2020-06-15 (×3): qty 2
  Filled 2020-06-15: qty 1.22
  Filled 2020-06-15 (×4): qty 2

## 2020-06-15 MED ORDER — INSULIN ASPART 100 UNIT/ML ~~LOC~~ SOLN
4.0000 [IU] | Freq: Three times a day (TID) | SUBCUTANEOUS | Status: DC
  Administered 2020-06-15 – 2020-06-17 (×5): 4 [IU] via SUBCUTANEOUS
  Filled 2020-06-15 (×5): qty 1

## 2020-06-15 MED ORDER — SODIUM CHLORIDE 0.9 % IV SOLN
2.0000 g | Freq: Once | INTRAVENOUS | Status: DC
  Filled 2020-06-15: qty 2

## 2020-06-15 MED ORDER — INSULIN ASPART 100 UNIT/ML ~~LOC~~ SOLN
25.0000 [IU] | Freq: Once | SUBCUTANEOUS | Status: AC
  Administered 2020-06-15: 25 [IU] via SUBCUTANEOUS
  Filled 2020-06-15: qty 1

## 2020-06-15 MED ORDER — INSULIN ASPART 100 UNIT/ML ~~LOC~~ SOLN
0.0000 [IU] | Freq: Three times a day (TID) | SUBCUTANEOUS | Status: DC
  Administered 2020-06-16: 11 [IU] via SUBCUTANEOUS
  Administered 2020-06-16: 8 [IU] via SUBCUTANEOUS
  Administered 2020-06-16: 15 [IU] via SUBCUTANEOUS
  Administered 2020-06-17: 8 [IU] via SUBCUTANEOUS
  Filled 2020-06-15 (×4): qty 1

## 2020-06-15 MED ORDER — INSULIN ASPART 100 UNIT/ML ~~LOC~~ SOLN
0.0000 [IU] | Freq: Every day | SUBCUTANEOUS | Status: DC
  Administered 2020-06-15 – 2020-06-16 (×2): 5 [IU] via SUBCUTANEOUS
  Filled 2020-06-15 (×2): qty 1

## 2020-06-15 NOTE — Progress Notes (Signed)
Patient's blood sugar >400. NP notified. No new orders at this time. Highest dose given on the evening sliding scale.

## 2020-06-15 NOTE — Progress Notes (Signed)
Patient ID: Donna Haas, female   DOB: 09-04-1975, 45 y.o.   MRN: [id number] [facility] PROGRESS NOTE  NAHAL WANLESS WER:[id number] DOB: 1975/09/20 DOA: 06/14/2020 PCP: Towanda Malkin, MD (Inactive)  HPI/Subjective: Patient with worsening swelling of the left side of her face.  She is also having pain and headache behind her left eye.  Patient states that it is a little worse from yesterday.  Admitted yesterday with facial cellulitis.  Objective: Vitals:   06/15/20 0742 06/15/20 1154  BP: 128/80 138/87  Pulse: 83 72  Resp: 18 18  Temp: 98.5 F (36.9 C) 97.7 F (36.5 C)  SpO2: 99% 95%    Intake/Output Summary (Last 24 hours) at 06/15/2020 1409 Last data filed at 06/15/2020 1032 Gross per 24 hour  Intake 1065.39 ml  Output 0 ml  Net 1065.39 ml   Filed Weights   06/14/20 0325  Weight: 90.3 kg    ROS: Review of Systems  Respiratory: Negative for shortness of breath.   Gastrointestinal: Negative for abdominal pain, diarrhea, nausea and vomiting.   Exam: Physical Exam HENT:     Head: Normocephalic.     Mouth/Throat:     Pharynx: No oropharyngeal exudate.     Comments: Left facial and cheek swelling. Eyes:     General: Lids are normal.     Conjunctiva/sclera: Conjunctivae normal.     Pupils: Pupils are equal, round, and reactive to light.  Cardiovascular:     Rate and Rhythm: Normal rate and regular rhythm.     Heart sounds: Normal heart sounds, S1 normal and S2 normal.  Pulmonary:     Breath sounds: Normal breath sounds. No decreased breath sounds, wheezing, rhonchi or rales.  Abdominal:     Palpations: Abdomen is soft.     Tenderness: There is no abdominal tenderness.  Musculoskeletal:     Right lower leg: No swelling.     Left lower leg: No swelling.  Skin:    Comments: Left facial swelling.  The area of redness above the left lip.  Neurological:     Mental Status: She is alert and oriented to person, place, and time.       Data  Reviewed: Basic Metabolic Panel: Recent Labs  Lab 06/14/20 0415 06/15/20 0510  NA 136 136  K 3.9 4.1  CL 102 99  CO2 26 26  GLUCOSE 326* 245*  BUN 15 7  CREATININE 0.66 0.65  CALCIUM 8.9 9.0   CBC: Recent Labs  Lab 06/14/20 0415 06/15/20 0510  WBC 7.6 10.7*  NEUTROABS 5.4  --   HGB 12.0 12.2  HCT 35.3* 36.6  MCV 89.6 91.3  PLT 214 198    CBG: Recent Labs  Lab 06/14/20 1247 06/14/20 1814 06/14/20 2148 06/15/20 0802 06/15/20 1135  GLUCAP 292* 275* 205* 294* 372*    Recent Results (from the past 240 hour(s))  Culture, blood (routine x 2)     Status: None (Preliminary result)   Collection Time: 06/14/20  4:15 AM   Specimen: BLOOD  Result Value Ref Range Status   Specimen Description BLOOD RIGHT ASSIST CONTROL  Final   Special Requests   Final    BOTTLES DRAWN AEROBIC AND ANAEROBIC Blood Culture adequate volume   Culture   Final    NO GROWTH 1 DAY Performed at [facility], [address] [place],  [address]    Report Status PENDING  Incomplete  Culture, blood (routine x 2)     Status: None (Preliminary  result)   Collection Time: 06/14/20  4:15 AM   Specimen: BLOOD  Result Value Ref Range Status   Specimen Description BLOOD RIGHT FOREARM  Final   Special Requests   Final    BOTTLES DRAWN AEROBIC AND ANAEROBIC Blood Culture adequate volume   Culture   Final    NO GROWTH 1 DAY Performed at [facility], [address] [place], [place] [address]    Report Status PENDING  Incomplete  Resp Panel by RT-PCR (Flu A&B, Covid) Nasopharyngeal Swab     Status: None   Collection Time: 06/14/20  4:15 AM   Specimen: Nasopharyngeal Swab; Nasopharyngeal(NP) swabs in vial transport medium  Result Value Ref Range Status   SARS Coronavirus 2 by RT PCR NEGATIVE NEGATIVE Final    Comment: (NOTE) SARS-CoV-2 target nucleic acids are NOT DETECTED.  The SARS-CoV-2 RNA is generally detectable in upper respiratory specimens during the acute phase  of infection. The lowest concentration of SARS-CoV-2 viral copies this assay can detect is 138 copies/mL. A negative result does not preclude SARS-Cov-2 infection and should not be used as the sole basis for treatment or other patient management decisions. A negative result may occur with  improper specimen collection/handling, submission of specimen other than nasopharyngeal swab, presence of viral mutation(s) within the areas targeted by this assay, and inadequate number of viral copies(<138 copies/mL). A negative result must be combined with clinical observations, patient history, and epidemiological information. The expected result is Negative.  Fact Sheet for Patients:  EntrepreneurPulse.com.au  Fact Sheet for Healthcare Providers:  IncredibleEmployment.be  This test is no t yet approved or cleared by the [place] FDA and  has been authorized for detection and/or diagnosis of SARS-CoV-2 by FDA under an Emergency Use Authorization (EUA). This EUA will remain  in effect (meaning this test can be used) for the duration of the COVID-19 declaration under Section 564(b)(1) of the Act, 21 U.S.C.section 360bbb-3(b)(1), unless the authorization is terminated  or revoked sooner.       Influenza A by PCR NEGATIVE NEGATIVE Final   Influenza B by PCR NEGATIVE NEGATIVE Final    Comment: (NOTE) The Xpert Xpress SARS-CoV-2/FLU/RSV plus assay is intended as an aid in the diagnosis of influenza from Nasopharyngeal swab specimens and should not be used as a sole basis for treatment. Nasal washings and aspirates are unacceptable for Xpert Xpress SARS-CoV-2/FLU/RSV testing.  Fact Sheet for Patients: EntrepreneurPulse.com.au  Fact Sheet for Healthcare Providers: IncredibleEmployment.be  This test is not yet approved or cleared by the [place] FDA and has been authorized for detection and/or diagnosis of  SARS-CoV-2 by FDA under an Emergency Use Authorization (EUA). This EUA will remain in effect (meaning this test can be used) for the duration of the COVID-19 declaration under Section 564(b)(1) of the Act, 21 U.S.C. section 360bbb-3(b)(1), unless the authorization is terminated or revoked.  Performed at [facility], [address] [place], [place] [address]      Studies: CT Maxillofacial W Contrast  Result Date: 06/14/2020 CLINICAL DATA:  Left-sided facial cellulitis, acute onset EXAM: CT MAXILLOFACIAL WITH CONTRAST TECHNIQUE: Multidetector CT imaging of the maxillofacial structures was performed with intravenous contrast. Multiplanar CT image reconstructions were also generated. CONTRAST:  71mL OMNIPAQUE IOHEXOL 300 MG/ML  SOLN COMPARISON:  None. FINDINGS: Osseous: No fracture or mandibular dislocation. No destructive process. Orbits: Negative. No traumatic or inflammatory finding. Sinuses: Clear Soft tissues: Left facial fat expansion and swelling without visible underlying odontogenic abscess or salivary disease. The  left facial vein is enhancing. No soft tissue emphysema Limited intracranial: Negative Other: Artifact from dental amalgam. IMPRESSION: Left facial cellulitis without underlying abscess. Electronically Signed   By: Monte Fantasia M.D.   On: 06/14/2020 06:07    Scheduled Meds: . dexamethasone (DECADRON) injection  4 mg Intravenous Q12H  . dextroamphetamine  10 mg Oral Daily  . enoxaparin (LOVENOX) injection  40 mg Subcutaneous Q24H  . escitalopram  10 mg Oral Daily  . ferrous sulfate  325 mg Oral Q breakfast  . insulin aspart  0-15 Units Subcutaneous TID WC  . insulin aspart  0-5 Units Subcutaneous QHS  . insulin aspart  4 Units Subcutaneous TID WC  . insulin glargine  15 Units Subcutaneous Daily  . loratadine  10 mg Oral Daily  . pantoprazole  40 mg Oral Daily   Continuous Infusions: . ceFEPime (MAXIPIME) IV 2 g (06/15/20 1057)  . vancomycin       Assessment/Plan:  1. Left facial cellulitis with worsening pain and swelling.  Change antibiotics from Ancef over to vancomycin and Maxipime.  Add Decadron. 2. Type 2 diabetes mellitus with hyperglycemia.  The patient does have a history of gestational diabetes.  Hemoglobin A1c elevated at 12.  I started low-dose Lantus insulin and sliding scale insulin plus short acting insulin prior to meals.  Diet exercise and weight loss discussed. 3. History of lupus not on any medications.  We will send off an ANA 4. obesity with a BMI of 38.86      Code Status:     Code Status Orders  (From admission, onward)         Start     Ordered   06/14/20 0849  Full code  Continuous        06/14/20 0852        Code Status History    Date Active Date Inactive Code Status Order ID Comments User Context   08/30/2015 1107 09/02/2015 1905 Full Code [id number]  Shepard General, RN Inpatient   08/22/2015 1230 08/22/2015 2011 Full Code [id number]  Burlene Arnt, CNM Inpatient   07/20/2015 0104 07/20/2015 0622 Full Code [id number]  Aletha Halim, MD Inpatient   06/03/2015 1824 06/04/2015 0002 Full Code [id number]  Dalia Heading, CNM Inpatient   Advance Care Planning Activity     Family Communication: Deferred Disposition Plan: Status is: Inpatient  Dispo: The patient is from: Home              Anticipated d/c is to: Home              Patient currently requiring a switch in antibiotics for left-sided facial swelling and starting of IV Decadron.   Difficult to place patient.  No.  Time spent: 28 minutes  [place]

## 2020-06-15 NOTE — Progress Notes (Signed)
Pharmacy Antibiotic Note  Donna Haas is a 45 y.o. female admitted on 06/14/2020 with cellulitis.  Pharmacy has been consulted for vancomycin and cefepime dosing.  Plan:  1) start cefepime 2 grams IV every 8 hours  2) vancomycin 1500 mg IV x 1 then 750 mg IV Q 12 hrs  Goal AUC 400-550  Expected AUC: 563  SCr used: 0.80 (rounded up)  Ke: 0.059 h-1, T1/2: 11.8 h  Daily SCr while on IV vancomycin   Level as clinically indicated   Height: 5' (152.4 cm) Weight: 90.3 kg (199 lb) IBW/kg (Calculated) : 45.5  Temp (24hrs), Avg:98.4 F (36.9 C), Min:97.8 F (36.6 C), Max:99 F (37.2 C)  Recent Labs  Lab 06/14/20 0415 06/15/20 0510  WBC 7.6 10.7*  CREATININE 0.66 0.65  LATICACIDVEN 1.8  --     Estimated Creatinine Clearance: 91.7 mL/min (by C-G formula based on SCr of 0.65 mg/dL).    Allergies  Allergen Reactions  . Compazine [Prochlorperazine] Anaphylaxis  . Prednisone Anaphylaxis  . Prochlorperazine Edisylate Anaphylaxis  . Etodolac     Other reaction(s): Unknown  . Hydrocodone-Acetaminophen Nausea Only  . Morphine And Related Nausea Only and Other (See Comments)    Mood swings, headache    Antimicrobials this admission: vancomycin 03/05 >>  cefepime 03/05 >>   Microbiology results: 03/04 BCx: NG < 12 hours 03/04 SARS CoV-2: negative 03/04 influenza A/B: negative  Thank you for allowing pharmacy to be a part of this patient's care.  Dallie Piles 06/15/2020 8:38 AM

## 2020-06-16 LAB — GLUCOSE, CAPILLARY
Glucose-Capillary: 320 mg/dL — ABNORMAL HIGH (ref 70–99)
Glucose-Capillary: 265 mg/dL — ABNORMAL HIGH (ref 70–99)
Glucose-Capillary: 366 mg/dL — ABNORMAL HIGH (ref 70–99)
Glucose-Capillary: 395 mg/dL — ABNORMAL HIGH (ref 70–99)

## 2020-06-16 LAB — CREATININE, SERUM
Creatinine, Ser: 0.67 mg/dL (ref 0.44–1.00)
GFR, Estimated: >60 mL/min

## 2020-06-16 MED ORDER — INSULIN GLARGINE 100 UNIT/ML ~~LOC~~ SOLN
28.0000 [IU] | Freq: Every day | SUBCUTANEOUS | Status: DC
  Administered 2020-06-16: 28 [IU] via SUBCUTANEOUS
  Filled 2020-06-16 (×2): qty 0.28

## 2020-06-16 MED ORDER — DEXAMETHASONE 4 MG PO TABS
4.0000 mg | ORAL_TABLET | Freq: Every day | ORAL | Status: DC
  Administered 2020-06-16 – 2020-06-17 (×2): 4 mg via ORAL
  Filled 2020-06-16 (×2): qty 1

## 2020-06-16 NOTE — Progress Notes (Signed)
Patient ID: Donna Haas, female   DOB: Feb 24, 1976, 45 y.o.   MRN: [id number] [facility] PROGRESS NOTE  Donna Haas DGL:[id number] DOB: [birth year] DOA: 06/14/2020 PCP: Towanda Malkin, MD (Inactive)  HPI/Subjective: Patient feeling better than yesterday.  Left facial swelling has come down a bit.  Able to touch the area better.  Patient is a new diagnosis of diabetes.  Nursing staff to teach the patient how to inject insulin.  Sugars went almost up to 500 yesterday with Decadron injection.  Objective: Vitals:   06/15/20 2306 06/16/20 0434  BP: (!) 99/56 (!) 98/54  Pulse: 63 69  Resp: 16 17  Temp: 97.7 F (36.5 C) 97.8 F (36.6 C)  SpO2: 98% 96%    Intake/Output Summary (Last 24 hours) at 06/16/2020 1157 Last data filed at 06/16/2020 1012 Gross per 24 hour  Intake 400 ml  Output --  Net 400 ml   Filed Weights   06/14/20 0325  Weight: 90.3 kg    ROS: Review of Systems  Respiratory: Negative for shortness of breath.   Cardiovascular: Negative for chest pain.  Gastrointestinal: Negative for abdominal pain, nausea and vomiting.   Exam: Physical Exam HENT:     Head: Normocephalic.     Mouth/Throat:     Pharynx: No oropharyngeal exudate.  Eyes:     General: Lids are normal.     Conjunctiva/sclera: Conjunctivae normal.     Pupils: Pupils are equal, round, and reactive to light.  Cardiovascular:     Rate and Rhythm: Normal rate and regular rhythm.     Heart sounds: Normal heart sounds, S1 normal and S2 normal.  Pulmonary:     Breath sounds: Normal breath sounds. No decreased breath sounds, wheezing, rhonchi or rales.  Abdominal:     Palpations: Abdomen is soft.     Tenderness: There is no abdominal tenderness.  Musculoskeletal:     Right lower leg: No swelling.     Left lower leg: No swelling.  Skin:    General: Skin is warm.     Findings: No rash.     Comments: Swelling face much improved.  Slight redness above the left upper lip.  Neurological:      Mental Status: She is alert and oriented to person, place, and time.       Data Reviewed: Basic Metabolic Panel: Recent Labs  Lab 06/14/20 0415 06/15/20 0510 06/15/20 1734 06/16/20 0457  NA 136 136  --   --   K 3.9 4.1  --   --   CL 102 99  --   --   CO2 26 26  --   --   GLUCOSE 326* 245* 467*  --   BUN 15 7  --   --   CREATININE 0.66 0.65  --  0.67  CALCIUM 8.9 9.0  --   --    CBC: Recent Labs  Lab 06/14/20 0415 06/15/20 0510  WBC 7.6 10.7*  NEUTROABS 5.4  --   HGB 12.0 12.2  HCT 35.3* 36.6  MCV 89.6 91.3  PLT 214 198    CBG: Recent Labs  Lab 06/15/20 0802 06/15/20 1135 06/15/20 1634 06/15/20 2115 06/16/20 0742  GLUCAP 294* 372* 472* 407* 320*    Recent Results (from the past 240 hour(s))  Culture, blood (routine x 2)     Status: None (Preliminary result)   Collection Time: 06/14/20  4:15 AM   Specimen: BLOOD  Result Value Ref Range Status   Specimen Description  BLOOD RIGHT ASSIST CONTROL  Final   Special Requests   Final    BOTTLES DRAWN AEROBIC AND ANAEROBIC Blood Culture adequate volume   Culture   Final    NO GROWTH 1 DAY Performed at [facility], [address] [place], [place] [address]    Report Status PENDING  Incomplete  Culture, blood (routine x 2)     Status: None (Preliminary result)   Collection Time: 06/14/20  4:15 AM   Specimen: BLOOD  Result Value Ref Range Status   Specimen Description BLOOD RIGHT FOREARM  Final   Special Requests   Final    BOTTLES DRAWN AEROBIC AND ANAEROBIC Blood Culture adequate volume   Culture   Final    NO GROWTH 1 DAY Performed at [facility], [address], [place], [place] [address]    Report Status PENDING  Incomplete  Resp Panel by RT-PCR (Flu A&B, Covid) Nasopharyngeal Swab     Status: None   Collection Time: 06/14/20  4:15 AM   Specimen: Nasopharyngeal Swab; Nasopharyngeal(NP) swabs in vial transport medium  Result Value Ref Range Status   SARS Coronavirus 2 by RT PCR  NEGATIVE NEGATIVE Final    Comment: (NOTE) SARS-CoV-2 target nucleic acids are NOT DETECTED.  The SARS-CoV-2 RNA is generally detectable in upper respiratory specimens during the acute phase of infection. The lowest concentration of SARS-CoV-2 viral copies this assay can detect is 138 copies/mL. A negative result does not preclude SARS-Cov-2 infection and should not be used as the sole basis for treatment or other patient management decisions. A negative result may occur with  improper specimen collection/handling, submission of specimen other than nasopharyngeal swab, presence of viral mutation(s) within the areas targeted by this assay, and inadequate number of viral copies(<138 copies/mL). A negative result must be combined with clinical observations, patient history, and epidemiological information. The expected result is Negative.  Fact Sheet for Patients:  EntrepreneurPulse.com.au  Fact Sheet for Healthcare Providers:  IncredibleEmployment.be  This test is no t yet approved or cleared by the [place] FDA and  has been authorized for detection and/or diagnosis of SARS-CoV-2 by FDA under an Emergency Use Authorization (EUA). This EUA will remain  in effect (meaning this test can be used) for the duration of the COVID-19 declaration under Section 564(b)(1) of the Act, 21 U.S.C.section 360bbb-3(b)(1), unless the authorization is terminated  or revoked sooner.       Influenza A by PCR NEGATIVE NEGATIVE Final   Influenza B by PCR NEGATIVE NEGATIVE Final    Comment: (NOTE) The Xpert Xpress SARS-CoV-2/FLU/RSV plus assay is intended as an aid in the diagnosis of influenza from Nasopharyngeal swab specimens and should not be used as a sole basis for treatment. Nasal washings and aspirates are unacceptable for Xpert Xpress SARS-CoV-2/FLU/RSV testing.  Fact Sheet for Patients: EntrepreneurPulse.com.au  Fact Sheet for  Healthcare Providers: IncredibleEmployment.be  This test is not yet approved or cleared by the [place] FDA and has been authorized for detection and/or diagnosis of SARS-CoV-2 by FDA under an Emergency Use Authorization (EUA). This EUA will remain in effect (meaning this test can be used) for the duration of the COVID-19 declaration under Section 564(b)(1) of the Act, 21 U.S.C. section 360bbb-3(b)(1), unless the authorization is terminated or revoked.  Performed at [facility], [address] [place], [place] [address]       Scheduled Meds: . dexamethasone  4 mg Oral Daily  . enoxaparin (LOVENOX) injection  40 mg Subcutaneous  Q24H  . escitalopram  10 mg Oral Daily  . ferrous sulfate  325 mg Oral Q breakfast  . insulin aspart  0-15 Units Subcutaneous TID WC  . insulin aspart  0-5 Units Subcutaneous QHS  . insulin aspart  4 Units Subcutaneous TID WC  . insulin glargine  28 Units Subcutaneous Daily  . loratadine  10 mg Oral Daily  . pantoprazole  40 mg Oral Daily   Continuous Infusions: . ceFEPime (MAXIPIME) IV 2 g (06/16/20 0609)  . vancomycin 750 mg (06/16/20 0853)    Assessment/Plan:  1. Left facial cellulitis.  This has improved from yesterday with pain and swelling being less.  Continue vancomycin and Maxipime today.  Give oral Decadron today and will have to do a taper upon getting out of the hospital.  Will likely switch over to Augmentin and doxycycline upon going home. 2. Type 2 diabetes mellitus with hyper glycemia.  Hemoglobin A1c elevated at 12.  Patient does have a prior history of gestational diabetes.  She will need a glucometer at home.  Increase Lantus insulin to 28 units daily plus short acting insulin prior to meals.  I did discuss diet at length with the patient.  I did discuss exercise and also weight loss.  Patient states that she is already lost 35 pounds. 3. History of lupus not on any medications.  ANA pending 4. Obesity  with a BMI of 38.86       Code Status:     Code Status Orders  (From admission, onward)         Start     Ordered   06/14/20 0849  Full code  Continuous        06/14/20 0852        Code Status History    Date Active Date Inactive Code Status Order ID Comments User Context   08/30/2015 1107 09/02/2015 1905 Full Code [id number]  Shepard General, RN Inpatient   08/22/2015 1230 08/22/2015 2011 Full Code [id number]  Burlene Arnt, CNM Inpatient   07/20/2015 0104 07/20/2015 0622 Full Code [id number]  Aletha Halim, MD Inpatient   06/03/2015 1824 06/04/2015 0002 Full Code [id number]  Dalia Heading, CNM Inpatient   Advance Care Planning Activity     Family Communication: Deferred Disposition Plan: Status is: Inpatient  Dispo: The patient is from: Home              Anticipated d/c is to: Home likely tomorrow              Patient currently on IV antibiotics for left facial cellulitis and nursing staff to teach how to inject insulin.   Difficult to place patient.  No.  Time spent: 28 minutes  [place]

## 2020-06-17 ENCOUNTER — Other Ambulatory Visit: Payer: Self-pay | Admitting: Internal Medicine

## 2020-06-17 LAB — CBC
HCT: 39.5 % (ref 36.0–46.0)
Hemoglobin: 13.4 g/dL (ref 12.0–15.0)
MCH: 30.4 pg (ref 26.0–34.0)
MCHC: 33.9 g/dL (ref 30.0–36.0)
MCV: 89.6 fL (ref 80.0–100.0)
Platelets: 268 10*3/uL (ref 150–400)
RBC: 4.41 MIL/uL (ref 3.87–5.11)
RDW: 11.9 % (ref 11.5–15.5)
WBC: 15.8 10*3/uL — ABNORMAL HIGH (ref 4.0–10.5)
nRBC: 0 % (ref 0.0–0.2)

## 2020-06-17 LAB — CREATININE, SERUM
Creatinine, Ser: 0.7 mg/dL (ref 0.44–1.00)
GFR, Estimated: >60 mL/min

## 2020-06-17 LAB — GLUCOSE, CAPILLARY: Glucose-Capillary: 266 mg/dL — ABNORMAL HIGH (ref 70–99)

## 2020-06-17 MED ORDER — INSULIN ASPART 100 UNIT/ML FLEXPEN: 7.0000 [IU] | PEN_INJECTOR | Freq: Three times a day (TID) | SUBCUTANEOUS | 1 refills | Status: DC

## 2020-06-17 MED ORDER — DOXYCYCLINE HYCLATE 100 MG PO TABS: 100.0000 mg | ORAL_TABLET | Freq: Two times a day (BID) | ORAL | Status: DC

## 2020-06-17 MED ORDER — INSULIN GLARGINE 100 UNIT/ML ~~LOC~~ SOLN
36.0000 [IU] | Freq: Every day | SUBCUTANEOUS | Status: DC
  Administered 2020-06-17: 36 [IU] via SUBCUTANEOUS
  Filled 2020-06-17: qty 0.36

## 2020-06-17 MED ORDER — BLOOD GLUCOSE MONITOR KIT: PACK | 0 refills | Status: DC

## 2020-06-17 MED ORDER — DEXAMETHASONE 1 MG PO TABS: ORAL_TABLET | ORAL | 0 refills | Status: DC

## 2020-06-17 MED ORDER — INSULIN PEN NEEDLE 33G X 4 MM MISC: 1.0000 | Freq: Three times a day (TID) | 0 refills | Status: DC

## 2020-06-17 MED ORDER — DOXYCYCLINE HYCLATE 100 MG PO TABS: 100.0000 mg | ORAL_TABLET | Freq: Two times a day (BID) | ORAL | 0 refills | Status: AC

## 2020-06-17 MED ORDER — AMOXICILLIN-POT CLAVULANATE 875-125 MG PO TABS: 1.0000 | ORAL_TABLET | Freq: Two times a day (BID) | ORAL | 0 refills | Status: AC

## 2020-06-17 MED ORDER — INSULIN GLARGINE 100 UNIT/ML SOLOSTAR PEN: 36.0000 [IU] | PEN_INJECTOR | Freq: Every day | SUBCUTANEOUS | 1 refills | Status: DC

## 2020-06-17 NOTE — Care Management Important Message (Signed)
Important Message  Patient Details  Name: Donna Haas MRN: [id number] Date of Birth: November 25, 1975   Medicare Important Message Given:  Yes     [name] 06/17/2020, 12:21 PM

## 2020-06-17 NOTE — Progress Notes (Signed)
Met w/ pt at bedside today.    Reviewed the following with pt: Lantus Insulin Novolog Insulin What they are, when to take, how to take, and doses ordered for home.    Discussed with pt that as her Decadron dose is tapered at home, that pt needs to watch for any signs and symptoms of Hypoglycemia, as her CBGs should improve as steroids are decreased and that the discharge amounts of Insulin might be too much after the steroids are reduced and stopped.  Reviewed signs and symptoms of HYPO and how to treat HYPO at home.  Discussed with pt that if she has early AM HYPO that she should reduce her dose of Lantus that AM by 1/3 to 1/2.  Also discussed with pt that when she takes her Novolog with food, that if she has HYPO after taking her Novolog, she should reduce the next dose due by 1/3 to 1/2.  Strongly encouraged pt to follow up with her PCP soon after d/c and to call her PCP should she have continuous issues with HYPO at home (as her doses may need to be reduced after steroids stopped).  Pt stated understanding.  Educated patient on insulin pen use at home.  Reviewed all steps of insulin pen including attachment of needle, 2-unit air shot, dialing up dose, giving injection, rotation of injection sites, removing needle, disposal of sharps, storage of unused insulin, disposal of insulin etc.  Patient able to provide successful return demonstration.  Reviewed troubleshooting with insulin pen.     --Will follow patient during hospitalization--  Wyn Quaker RN, MSN, CDE Diabetes Coordinator Inpatient Glycemic Control Team Team Pager: [phone] (8a-5p)

## 2020-06-17 NOTE — Discharge Summary (Signed)
[facility] NAME: Donna Haas    MR#:  [id number]  DATE OF BIRTH:  [birth year]  DATE OF ADMISSION:  06/14/2020 ADMITTING PHYSICIAN: Collier Bullock, MD  DATE OF DISCHARGE: 06/17/2020 11:15 AM  PRIMARY CARE PHYSICIAN: Towanda Malkin, MD (Inactive)    ADMISSION DIAGNOSIS:  Hyperglycemia [R73.9] Facial cellulitis [A19.379]  DISCHARGE DIAGNOSIS:  Principal Problem:   Facial cellulitis Active Problems:   GERD   Lupus ([place])   Anxiety, generalized   Diabetes mellitus with hyperglycemia ([place])   Obesity (BMI 30-39.9)   SECONDARY DIAGNOSIS:   Past Medical History:  Diagnosis Date  . Anxiety   . Back pain   . Endometriosis   . GERD (gastroesophageal reflux disease)   . Gestational diabetes   . Lumbar herniated disc   . Lupus ([place])   . Migraines   . Morbid obesity ([place]) 01/25/2016  . Nephrolithiasis   . Obesity   . Pancreatitis   . Plantar fasciitis   . Thyroid cyst     HOSPITAL COURSE:   1.  Left facial cellulitis.  Initially the patient was started on Ancef but swelling and pain increase.  I changed antibiotic to cefepime and vancomycin.  I gave IV Decadron.  Patient continued to improve during the hospital course and will discharge home on a course of Augmentin and doxycycline and a short course of low-dose oral Decadron. 2.  Type 2 diabetes mellitus with hyperglycemia.  Hemoglobin A1c elevated at 12.  Patient did have a prior history of gestational diabetes.  Prescribed a glucometer test strips and lancets.  Spoke with diabetes coordinator and increased Lantus insulin to 36 units and short acting insulin to 7 units prior to meals.  Sugars will likely be high over the next couple days while on oral Decadron.  Diet exercise and weight loss discussed at length. 3.  History of lupus not on any medications.  ANA still pending at this point. 4.  Obesity with a BMI of 38.86.  DISCHARGE CONDITIONS:    Satisfactory  CONSULTS OBTAINED:  None  DRUG ALLERGIES:   Allergies  Allergen Reactions  . Compazine [Prochlorperazine] Anaphylaxis  . Prednisone Anaphylaxis  . Prochlorperazine Edisylate Anaphylaxis  . Etodolac     Other reaction(s): Unknown  . Hydrocodone-Acetaminophen Nausea Only  . Morphine And Related Nausea Only and Other (See Comments)    Mood swings, headache    DISCHARGE MEDICATIONS:   Allergies as of 06/17/2020      Reactions   Compazine [prochlorperazine] Anaphylaxis   Prednisone Anaphylaxis   Prochlorperazine Edisylate Anaphylaxis   Etodolac    Other reaction(s): Unknown   Hydrocodone-acetaminophen Nausea Only   Morphine And Related Nausea Only, Other (See Comments)   Mood swings, headache      Medication List    STOP taking these medications   ALPRAZolam 1 MG tablet Commonly known as: XANAX   magnesium oxide 400 MG tablet Commonly known as: MAG-OX   tiZANidine 4 MG tablet Commonly known as: ZANAFLEX   zolpidem 10 MG tablet Commonly known as: AMBIEN     TAKE these medications   amoxicillin-clavulanate 875-125 MG tablet Commonly known as: Augmentin Take 1 tablet by mouth 2 (two) times daily for 7 days.   blood glucose meter kit and supplies Kit Dispense based on patient and insurance preference. Use up to four times daily as directed.   clonazePAM 1 MG tablet Commonly known as: KLONOPIN Take 1 mg by mouth 3 (three) times  daily as needed for anxiety.   dexamethasone 1 MG tablet Commonly known as: DECADRON 2 tabs po daily for two days then 1 tab po daily for two days then 1/2 tab po daily for two days (patient does not have an allegey to decadron and can take)   dextroamphetamine 10 MG tablet Commonly known as: DEXTROSTAT Take 10 mg by mouth daily.   doxycycline 100 MG tablet Commonly known as: VIBRA-TABS Take 1 tablet (100 mg total) by mouth every 12 (twelve) hours for 7 days.   escitalopram 20 MG tablet Commonly known as:  LEXAPRO Take 10 tablets by mouth daily.   esomeprazole 20 MG capsule Commonly known as: NEXIUM Take 20 mg by mouth daily.   ferrous sulfate 325 (65 FE) MG tablet Take 325 mg by mouth daily.   insulin aspart 100 UNIT/ML FlexPen Commonly known as: NOVOLOG Inject 7 Units into the skin 3 (three) times daily with meals.   insulin glargine 100 UNIT/ML Solostar Pen Commonly known as: LANTUS Inject 36 Units into the skin daily.   Insulin Pen Needle 33G X 4 MM Misc 1 Dose by Does not apply route 3 (three) times daily before meals.   loratadine 10 MG tablet Commonly known as: CLARITIN Take 10 mg by mouth daily.        DISCHARGE INSTRUCTIONS:   Follow-up PMD 5 days  If you experience worsening of your admission symptoms, develop shortness of breath, life threatening emergency, suicidal or homicidal thoughts you must seek medical attention immediately by calling 911 or calling your MD immediately  if symptoms less severe.  You Must read complete instructions/literature along with all the possible adverse reactions/side effects for all the Medicines you take and that have been prescribed to you. Take any new Medicines after you have completely understood and accept all the possible adverse reactions/side effects.   Please note  You were cared for by a hospitalist during your hospital stay. If you have any questions about your discharge medications or the care you received while you were in the hospital after you are discharged, you can call the unit and asked to speak with the hospitalist on call if the hospitalist that took care of you is not available. Once you are discharged, your primary care physician will handle any further medical issues. Please note that NO REFILLS for any discharge medications will be authorized once you are discharged, as it is imperative that you return to your primary care physician (or establish a relationship with a primary care physician if you do not have  one) for your aftercare needs so that they can reassess your need for medications and monitor your lab values.    Today   CHIEF COMPLAINT:   Chief Complaint  Patient presents with  . Cellulitis    HISTORY OF PRESENT ILLNESS:  Donna Haas  is a 45 y.o. female came in with left facial cellulitis   VITAL SIGNS:  Blood pressure 112/71, pulse 68, temperature 98.2 F (36.8 C), temperature source Oral, resp. rate 16, height 5' (1.524 m), weight 90.3 kg, last menstrual period 06/13/2020, SpO2 97 %, unknown if currently breastfeeding.  I/O:    Intake/Output Summary (Last 24 hours) at 06/17/2020 1534 Last data filed at 06/17/2020 1013 Gross per 24 hour  Intake 1860 ml  Output --  Net 1860 ml    PHYSICAL EXAMINATION:  GENERAL:  45 y.o.-year-old patient lying in the bed with no acute distress.  EYES: Pupils equal, round, reactive to light and  accommodation. No scleral icterus. HEENT: Head atraumatic, normocephalic. Oropharynx and nasopharynx clear.   LUNGS: Normal breath sounds bilaterally, no wheezing, rales,rhonchi or crepitation. No use of accessory muscles of respiration.  CARDIOVASCULAR: S1, S2 normal. No murmurs, rubs, or gallops.  ABDOMEN: Soft, non-tender, non-distended. Bowel sounds present. No organomegaly or mass.  EXTREMITIES: No pedal edema.  NEUROLOGIC: Cranial nerves II through XII are intact. Muscle strength 5/5 in all extremities. Sensation intact. Gait not checked.  PSYCHIATRIC: The patient is alert and oriented x 3.  SKIN: Left facial swelling decreased from yesterday.  Small spot above left lip with slight erythematous area.  DATA REVIEW:   CBC Recent Labs  Lab 06/17/20 0507  WBC 15.8*  HGB 13.4  HCT 39.5  PLT 268    Chemistries  Recent Labs  Lab 06/15/20 0510 06/15/20 1734 06/16/20 0457 06/17/20 0507  NA 136  --   --   --   K 4.1  --   --   --   CL 99  --   --   --   CO2 26  --   --   --   GLUCOSE 245* 467*  --   --   BUN 7  --   --   --    CREATININE 0.65  --    < > 0.70  CALCIUM 9.0  --   --   --    < > = values in this interval not displayed.    Microbiology Results  Results for orders placed or performed during the hospital encounter of 06/14/20  Culture, blood (routine x 2)     Status: None (Preliminary result)   Collection Time: 06/14/20  4:15 AM   Specimen: BLOOD  Result Value Ref Range Status   Specimen Description BLOOD RIGHT ASSIST CONTROL  Final   Special Requests   Final    BOTTLES DRAWN AEROBIC AND ANAEROBIC Blood Culture adequate volume   Culture   Final    NO GROWTH 3 DAYS Performed at [facility], [address], [place], [place] [address]    Report Status PENDING  Incomplete  Culture, blood (routine x 2)     Status: None (Preliminary result)   Collection Time: 06/14/20  4:15 AM   Specimen: BLOOD  Result Value Ref Range Status   Specimen Description BLOOD RIGHT FOREARM  Final   Special Requests   Final    BOTTLES DRAWN AEROBIC AND ANAEROBIC Blood Culture adequate volume   Culture   Final    NO GROWTH 3 DAYS Performed at [facility], [address], [place], [place] [address]    Report Status PENDING  Incomplete  Resp Panel by RT-PCR (Flu A&B, Covid) Nasopharyngeal Swab     Status: None   Collection Time: 06/14/20  4:15 AM   Specimen: Nasopharyngeal Swab; Nasopharyngeal(NP) swabs in vial transport medium  Result Value Ref Range Status   SARS Coronavirus 2 by RT PCR NEGATIVE NEGATIVE Final    Comment: (NOTE) SARS-CoV-2 target nucleic acids are NOT DETECTED.  The SARS-CoV-2 RNA is generally detectable in upper respiratory specimens during the acute phase of infection. The lowest concentration of SARS-CoV-2 viral copies this assay can detect is 138 copies/mL. A negative result does not preclude SARS-Cov-2 infection and should not be used as the sole basis for treatment or other patient management decisions. A negative result may occur with  improper specimen  collection/handling, submission of specimen other than nasopharyngeal swab, presence of viral mutation(s) within the  areas targeted by this assay, and inadequate number of viral copies(<138 copies/mL). A negative result must be combined with clinical observations, patient history, and epidemiological information. The expected result is Negative.  Fact Sheet for Patients:  EntrepreneurPulse.com.au  Fact Sheet for Healthcare Providers:  IncredibleEmployment.be  This test is no t yet approved or cleared by the [place] FDA and  has been authorized for detection and/or diagnosis of SARS-CoV-2 by FDA under an Emergency Use Authorization (EUA). This EUA will remain  in effect (meaning this test can be used) for the duration of the COVID-19 declaration under Section 564(b)(1) of the Act, 21 U.S.C.section 360bbb-3(b)(1), unless the authorization is terminated  or revoked sooner.       Influenza A by PCR NEGATIVE NEGATIVE Final   Influenza B by PCR NEGATIVE NEGATIVE Final    Comment: (NOTE) The Xpert Xpress SARS-CoV-2/FLU/RSV plus assay is intended as an aid in the diagnosis of influenza from Nasopharyngeal swab specimens and should not be used as a sole basis for treatment. Nasal washings and aspirates are unacceptable for Xpert Xpress SARS-CoV-2/FLU/RSV testing.  Fact Sheet for Patients: EntrepreneurPulse.com.au  Fact Sheet for Healthcare Providers: IncredibleEmployment.be  This test is not yet approved or cleared by the [place] FDA and has been authorized for detection and/or diagnosis of SARS-CoV-2 by FDA under an Emergency Use Authorization (EUA). This EUA will remain in effect (meaning this test can be used) for the duration of the COVID-19 declaration under Section 564(b)(1) of the Act, 21 U.S.C. section 360bbb-3(b)(1), unless the authorization is terminated or revoked.  Performed at [facility], [address], [place], [place] [address]      Management plans discussed with the patient, family and they are in agreement.  CODE STATUS:     Code Status Orders  (From admission, onward)         Start     Ordered   06/14/20 0849  Full code  Continuous        06/14/20 0852        Code Status History    Date Active Date Inactive Code Status Order ID Comments User Context   08/30/2015 1107 09/02/2015 1905 Full Code [id number]  [facility], RN Inpatient   08/22/2015 1230 08/22/2015 2011 Full Code [id number]  Burlene Arnt, [place] Inpatient   07/20/2015 0104 07/20/2015 0622 Full Code [id number]  Aletha Halim, MD Inpatient   06/03/2015 1824 06/04/2015 0002 Full Code [id number]  Dalia Heading, CNM Inpatient   Advance Care Planning Activity      TOTAL TIME TAKING CARE OF THIS PATIENT: 35 minutes.    Loletha Grayer M.D on 06/17/2020 at 3:34 PM  Between 7am to 6pm - Pager - [phone]  After 6pm go to [email or web address] - password [email or web address]  [facility]  CC: Primary care physician; [facility].

## 2020-06-17 NOTE — Progress Notes (Signed)
Pt will be discharging home. No concerns at this time.

## 2020-06-18 ENCOUNTER — Telehealth: Payer: Self-pay

## 2020-06-18 LAB — ANA W/REFLEX IF POSITIVE: Anti Nuclear Antibody (ANA): NEGATIVE

## 2020-06-18 NOTE — Telephone Encounter (Signed)
Transition Care Management Follow-up Telephone Call  Date of discharge and from where: 06/17/20 [facility]  How have you been since you were released from the hospital? Pt states she is doing okay. Denies pain. Discussed new DM diagnosis and offered referral to nutrition/diabetic education which patient declined at this time. Mailed DM handouts.   Any questions or concerns? No  Items Reviewed:  Did the pt receive and understand the discharge instructions provided? Yes   Medications obtained and verified? Yes  Other? No   Any new allergies since your discharge? No   Dietary orders reviewed? Yes  Do you have support at home? Yes   Home Care and Equipment/Supplies: Were home health services ordered? no Were any new equipment or medical supplies ordered?  Yes: glucometer and testing supplies Were you able to get the supplies/equipment? yes Do you have any questions related to the use of the equipment or supplies? No  Functional Questionnaire: (I = Independent and D = Dependent) ADLs: I  Bathing/Dressing- I  Meal Prep- I  Eating- I  Maintaining continence- I  Transferring/Ambulation- I  Managing Meds- I  Follow up appointments reviewed:   PCP Hospital f/u appt confirmed? Yes  Scheduled to see Royetta Crochet PAC on 07/03/20 @ 1:40. This is outside 14 day window for TCM services. Pt advised will be contacted if earlier appt available.   [facility] f/u appt confirmed? No ; patient may need new endo referral for new diagnosis of DM: A1c in hospital 12.0%  Are transportation arrangements needed? No   If their condition worsens, is the pt aware to call PCP or go to the Emergency Dept.? Yes  Was the patient provided with contact information for the PCP's office or ED? Yes  Was to pt encouraged to call back with questions or concerns? Yes

## 2020-06-19 LAB — CULTURE, BLOOD (ROUTINE X 2)
Culture: NO GROWTH
Culture: NO GROWTH
Special Requests: ADEQUATE
Special Requests: ADEQUATE

## 2020-07-03 ENCOUNTER — Other Ambulatory Visit: Payer: Self-pay | Admitting: Physician Assistant

## 2020-07-03 ENCOUNTER — Other Ambulatory Visit: Payer: Self-pay

## 2020-07-03 ENCOUNTER — Encounter: Payer: Self-pay | Admitting: Physician Assistant

## 2020-07-03 ENCOUNTER — Ambulatory Visit (INDEPENDENT_AMBULATORY_CARE_PROVIDER_SITE_OTHER): Payer: Medicare Other | Admitting: Physician Assistant

## 2020-07-03 VITALS — BP 102/68 | HR 101 | Temp 98.2°F | Resp 16 | Ht 60.0 in | Wt 201.2 lb

## 2020-07-03 DIAGNOSIS — E669 Obesity, unspecified: Secondary | ICD-10-CM | POA: Diagnosis not present

## 2020-07-03 DIAGNOSIS — D509 Iron deficiency anemia, unspecified: Secondary | ICD-10-CM | POA: Diagnosis not present

## 2020-07-03 DIAGNOSIS — L989 Disorder of the skin and subcutaneous tissue, unspecified: Secondary | ICD-10-CM

## 2020-07-03 DIAGNOSIS — Z23 Encounter for immunization: Secondary | ICD-10-CM | POA: Diagnosis not present

## 2020-07-03 DIAGNOSIS — E1169 Type 2 diabetes mellitus with other specified complication: Secondary | ICD-10-CM | POA: Diagnosis not present

## 2020-07-03 MED ORDER — FREESTYLE LIBRE 14 DAY READER DEVI: 1.0000 | 1 refills | Status: DC

## 2020-07-03 MED ORDER — METFORMIN HCL 1000 MG PO TABS: ORAL_TABLET | ORAL | 0 refills | Status: DC

## 2020-07-03 NOTE — Patient Instructions (Signed)
Diabetes Mellitus and Exercise Exercising regularly is important for overall health, especially for people who have diabetes mellitus. Exercising is not only about losing weight. It has many other health benefits, such as increasing muscle strength and bone density and reducing body fat and stress. This leads to improved fitness, flexibility, and endurance, all of which result in better overall health. What are the benefits of exercise if I have diabetes? Exercise has many benefits for people with diabetes. They include:  Helping to lower and control blood sugar (glucose).  Helping the body to respond better to the hormone insulin by improving insulin sensitivity.  Reducing how much insulin the body needs.  Lowering the risk for heart disease by: ? Lowering "bad" cholesterol and triglyceride levels. ? Increasing "good" cholesterol levels. ? Lowering blood pressure. ? Lowering blood glucose levels. What is my activity plan? Your health care provider or certified diabetes educator can help you make a plan for the type and frequency of exercise that works for you. This is called your activity plan. Be sure to:  Get at least 150 minutes of medium-intensity or high-intensity exercise each week. Exercises may include brisk walking, biking, or water aerobics.  Do stretching and strengthening exercises, such as yoga or weight lifting, at least 2 times a week.  Spread out your activity over at least 3 days of the week.  Get some form of physical activity each day. ? Do not go more than 2 days in a row without some kind of physical activity. ? Avoid being inactive for more than 90 minutes at a time. Take frequent breaks to walk or stretch.  Choose exercises or activities that you enjoy. Set realistic goals.  Start slowly and gradually increase your exercise intensity over time.   How do I manage my diabetes during exercise? Monitor your blood glucose  Check your blood glucose before and  after exercising. If your blood glucose is: ? 240 mg/dL (13.3 mmol/L) or higher before you exercise, check your urine for ketones. These are chemicals created by the liver. If you have ketones in your urine, do not exercise until your blood glucose returns to normal. ? 100 mg/dL (5.6 mmol/L) or lower, eat a snack containing 15-20 grams of carbohydrate. Check your blood glucose 15 minutes after the snack to make sure that your glucose level is above 100 mg/dL (5.6 mmol/L) before you start your exercise.  Know the symptoms of low blood glucose (hypoglycemia) and how to treat it. Your risk for hypoglycemia increases during and after exercise. Follow these tips and your health care provider's instructions  Keep a carbohydrate snack that is fast-acting for use before, during, and after exercise to help prevent or treat hypoglycemia.  Avoid injecting insulin into areas of the body that are going to be exercised. For example, avoid injecting insulin into: ? Your arms, when you are about to play tennis. ? Your legs, when you are about to go jogging.  Keep records of your exercise habits. Doing this can help you and your health care provider adjust your diabetes management plan as needed. Write down: ? Food that you eat before and after you exercise. ? Blood glucose levels before and after you exercise. ? The type and amount of exercise you have done.  Work with your health care provider when you start a new exercise or activity. He or she may need to: ? Make sure that the activity is safe for you. ? Adjust your insulin, other medicines, and food that   you eat.  Drink plenty of water while you exercise. This prevents loss of water (dehydration) and problems caused by a lot of heat in the body (heat stroke).   Where to find more information  American Diabetes Association: www.diabetes.org Summary  Exercising regularly is important for overall health, especially for people who have diabetes  mellitus.  Exercising has many health benefits. It increases muscle strength and bone density and reduces body fat and stress. It also lowers and controls blood glucose.  Your health care provider or certified diabetes educator can help you make an activity plan for the type and frequency of exercise that works for you.  Work with your health care provider to make sure any new activity is safe for you. Also work with your health care provider to adjust your insulin, other medicines, and the food you eat. This information is not intended to replace advice given to you by your health care provider. Make sure you discuss any questions you have with your health care provider. Document Revised: 12/26/2018 Document Reviewed: 12/26/2018 Elsevier Patient Education  2021 [employer].  

## 2020-07-03 NOTE — Progress Notes (Signed)
Established patient visit   Patient: Donna Haas   DOB: [birth year]   45 y.o. Female  MRN: [id number] Visit Date: 07/03/2020  Today's healthcare provider: Trinna Post, PA-C   Chief Complaint  Patient presents with  . Hospitalization Follow-up  . Cellulitis    It has cleared up  . Diabetes    New onset  . Cyst    In head , states has had some removed in past by derm   Subjective    HPI HPI    Cellulitis    Comments: It has cleared up          Diabetes    Comments: New onset          Cyst    Comments: In head , states has had some removed in past by derm       Last edited by Cathrine Muster, [place] on 07/03/2020  1:38 PM. (History)      Patient admitted to the hospital on 06/14/2020 for facial cellulitis and was treated as below:  Left facial cellulitis.  Initially the patient was started on Ancef but swelling and pain increase.  I changed antibiotic to cefepime and vancomycin.  I gave IV Decadron.  Patient continued to improve during the hospital course and will discharge home on a course of Augmentin and doxycycline and a short course of low-dose oral Decadron.  While she was in the hospital, it was noted that her A1c was 12 and she was started on basal and meal time insulin.   Diabetes Mellitus Type II, Follow-up  Lab Results  Component Value Date   HGBA1C 12.0 (H) 06/14/2020   HGBA1C 6.1 (H) 01/21/2016   Wt Readings from Last 3 Encounters:  07/03/20 201 lb 3.2 oz (91.3 kg)  06/14/20 199 lb (90.3 kg)  08/23/19 215 lb (97.5 kg)    Symptoms: No fatigue No foot ulcerations  No appetite changes No nausea  No paresthesia of the feet  No polydipsia  No polyuria No visual disturbances   No vomiting     Home blood sugar records: decreasing   Episodes of hypoglycemia? No    Current insulin regiment: lantus 36 units once nightly, using novolog 7 units sometimes around breakfast Most Recent Eye Exam: needs to be scheduled Current exercise:  aerobics Current diet habits: well balanced  Pertinent Labs: Lab Results  Component Value Date   CHOL 166 01/21/2016   HDL 34 (L) 01/21/2016   LDLCALC NOT CALC 01/21/2016   LDLDIRECT 132.4 05/30/2007   TRIG 499 (H) 01/21/2016   CHOLHDL 4.9 01/21/2016   Lab Results  Component Value Date   NA 136 06/15/2020   K 4.1 06/15/2020   CREATININE 0.70 06/17/2020   GFRNONAA >60 06/17/2020   GFRAA >60 03/22/2017   GLUCOSE 467 (H) 06/15/2020     ---------------------------------------------------------------------------------------------------      Medications: Outpatient Medications Prior to Visit  Medication Sig  . clonazePAM (KLONOPIN) 1 MG tablet Take 1 mg by mouth 3 (three) times daily as needed for anxiety.  Marland Kitchen dexamethasone (DECADRON) 1 MG tablet 2 tabs po daily for two days then 1 tab po daily for two days then 1/2 tab po daily for two days (patient does not have an allegey to decadron and can take)  . dextroamphetamine (DEXTROSTAT) 10 MG tablet Take 10 mg by mouth daily.  Marland Kitchen escitalopram (LEXAPRO) 20 MG tablet Take 10 tablets by mouth daily.  Marland Kitchen esomeprazole (NEXIUM) 20 MG capsule Take  20 mg by mouth daily.   . ferrous sulfate 325 (65 FE) MG tablet Take 325 mg by mouth daily.  . insulin aspart (NOVOLOG) 100 UNIT/ML FlexPen Inject 7 Units into the skin 3 (three) times daily with meals.  . insulin glargine (LANTUS) 100 UNIT/ML Solostar Pen Inject 36 Units into the skin daily.  . Insulin Pen Needle 33G X 4 MM MISC 1 Dose by Does not apply route 3 (three) times daily before meals.  Marland Kitchen loratadine (CLARITIN) 10 MG tablet Take 10 mg by mouth daily.  . blood glucose meter kit and supplies KIT Dispense based on patient and insurance preference. Use up to four times daily as directed.  . blood glucose meter kit and supplies KIT Dispense based on patient and insurance preference. Use up to four times daily as directed.  . blood glucose meter kit and supplies KIT Dispense based on patient and  insurance preference.  Check Glucose three times a day. Dx: E11.65   No facility-administered medications prior to visit.    Review of Systems     Objective    BP 102/68   Pulse (!) 101   Temp 98.2 F (36.8 C)   Resp 16   Ht 5' (1.524 m)   Wt 201 lb 3.2 oz (91.3 kg)   LMP 06/13/2020 (Exact Date)   SpO2 97%   BMI 39.29 kg/m     Physical Exam Constitutional:      Appearance: Normal appearance.  Cardiovascular:     Rate and Rhythm: Normal rate and regular rhythm.     Heart sounds: Normal heart sounds.  Pulmonary:     Effort: Pulmonary effort is normal.     Breath sounds: Normal breath sounds.  Skin:    General: Skin is warm and dry.  Neurological:     Mental Status: She is alert and oriented to person, place, and time. Mental status is at baseline.  Psychiatric:        Mood and Affect: Mood normal.        Behavior: Behavior normal.       No results found for any visits on 07/03/20.  Assessment & Plan    1. Type 2 diabetes mellitus with other specified complication, without long-term current use of insulin (HCC)  Will titrate metformin up to maximum dose. Stay on insulin for now but may decrease basal insulin by two units daily if fasting blood sugar is < 80. Expect to transition her from insulin to GLP1 once she is tolerating metformin. Anticipate statin at next visit.   - Microalbumin, urine - Ambulatory referral to Ophthalmology - Lipid Profile - C-peptide - metFORMIN (GLUCOPHAGE) 1000 MG tablet; Take one half pill daily x 1 wk. Take one half pill twice daily x 1 wk. Take one pill in the morning and half pill at night x 1 wk. Take on pill twice daily onward.  Dispense: 180 tablet; Refill: 0 - Continuous Blood Gluc Receiver (FREESTYLE LIBRE 14 DAY READER) DEVI; 1 Device by Does not apply route every 14 (fourteen) days. for type insulin dependent type 2 diabetes  Dispense: 6 each; Refill: 1  2. Obesity (BMI 30-39.9)  Discussed importance of healthy weight  management Discussed diet and exercise   3. Need for pneumococcal vaccination  - Pneumococcal polysaccharide vaccine 23-valent greater than or equal to 2yo subcutaneous/IM  4. Iron deficiency anemia, unspecified iron deficiency anemia type  - CBC with Differential - Fe+TIBC+Fer  5. Skin lesion  - Ambulatory referral to Dermatology  Return in about 1 month (around 08/03/2020).         Trinna Post, PA-C  [facility] [phone] (phone) [phone] (fax)  [place]

## 2020-07-04 ENCOUNTER — Other Ambulatory Visit: Payer: Self-pay

## 2020-07-04 DIAGNOSIS — E1169 Type 2 diabetes mellitus with other specified complication: Secondary | ICD-10-CM

## 2020-07-04 LAB — LIPID PANEL
Cholesterol: 196 mg/dL
HDL: 46 mg/dL — ABNORMAL LOW
LDL Cholesterol (Calc): 102 mg/dL — ABNORMAL HIGH
Non-HDL Cholesterol (Calc): 150 mg/dL — ABNORMAL HIGH
Total CHOL/HDL Ratio: 4.3 (calc)
Triglycerides: 363 mg/dL — ABNORMAL HIGH

## 2020-07-04 LAB — CBC WITH DIFFERENTIAL/PLATELET
Absolute Monocytes: 628 {cells}/uL (ref 200–950)
Basophils Absolute: 46 {cells}/uL (ref 0–200)
Basophils Relative: 0.5 %
Eosinophils Absolute: 218 {cells}/uL (ref 15–500)
Eosinophils Relative: 2.4 %
HCT: 40.8 % (ref 35.0–45.0)
Hemoglobin: 13.5 g/dL (ref 11.7–15.5)
Lymphs Abs: 1574 {cells}/uL (ref 850–3900)
MCH: 30 pg (ref 27.0–33.0)
MCHC: 33.1 g/dL (ref 32.0–36.0)
MCV: 90.7 fL (ref 80.0–100.0)
MPV: 11.2 fL (ref 7.5–12.5)
Monocytes Relative: 6.9 %
Neutro Abs: 6634 {cells}/uL (ref 1500–7800)
Neutrophils Relative %: 72.9 %
Platelets: 253 10*3/uL (ref 140–400)
RBC: 4.5 Million/uL (ref 3.80–5.10)
RDW: 12.2 % (ref 11.0–15.0)
Total Lymphocyte: 17.3 %
WBC: 9.1 10*3/uL (ref 3.8–10.8)

## 2020-07-04 LAB — IRON,TIBC AND FERRITIN PANEL
%SAT: 13 % — ABNORMAL LOW (ref 16–45)
Ferritin: 225 ng/mL (ref 16–232)
Iron: 50 ug/dL (ref 40–190)
TIBC: 389 ug/dL (ref 250–450)

## 2020-07-04 LAB — C-PEPTIDE: C-Peptide: 6.37 ng/mL — ABNORMAL HIGH (ref 0.80–3.85)

## 2020-07-04 LAB — MICROALBUMIN, URINE: Microalb, Ur: 0.4 mg/dL

## 2020-07-04 MED ORDER — FREESTYLE LIBRE 14 DAY READER DEVI: 1.0000 | 1 refills | Status: DC

## 2020-07-16 ENCOUNTER — Inpatient Hospital Stay: Payer: Medicare Other | Admitting: Family Medicine

## 2020-07-19 ENCOUNTER — Inpatient Hospital Stay: Payer: Medicare Other | Admitting: Family Medicine

## 2020-08-06 ENCOUNTER — Ambulatory Visit: Payer: Medicare Other | Admitting: Physician Assistant

## 2020-08-13 ENCOUNTER — Other Ambulatory Visit: Payer: Self-pay | Admitting: Family Medicine

## 2020-08-13 MED ORDER — INSULIN PEN NEEDLE 33G X 4 MM MISC: 1.0000 | Freq: Three times a day (TID) | 0 refills | Status: DC

## 2020-08-13 MED ORDER — INSULIN GLARGINE 100 UNIT/ML SOLOSTAR PEN: 36.0000 [IU] | PEN_INJECTOR | Freq: Every day | SUBCUTANEOUS | 1 refills | Status: DC

## 2020-08-13 NOTE — Telephone Encounter (Signed)
LAST APPT 07-03-2020 HAS ON ON 08-28-2020 with Lucio Edward

## 2020-08-13 NOTE — Telephone Encounter (Signed)
Notes to clinic:  Supplies were prescribed in the hospital and need to have filled by pcp Patient has appt on 08/28/2020 Review for refills   Requested Prescriptions  Pending Prescriptions Disp Refills   insulin glargine (LANTUS) 100 UNIT/ML Solostar Pen 15 mL 1    Sig: Inject 36 Units into the skin daily.      Endocrinology:  Diabetes - Insulins Failed - 08/13/2020  2:13 PM      Failed - HBA1C is between 0 and 7.9 and within 180 days    Hgb A1c MFr Bld  Date Value Ref Range Status  06/14/2020 12.0 (H) 4.8 - 5.6 % Final    Comment:    (NOTE) Pre diabetes:          5.7%-6.4%  Diabetes:              >6.4%  Glycemic control for   <7.0% adults with diabetes           Passed - Valid encounter within last 6 months    Recent Outpatient Visits           1 month ago Need for pneumococcal vaccination   [facility] [place], Adriana M, [place]   3 months ago Upper respiratory tract infection, unspecified type   [facility] Towanda Malkin, [place]   11 months ago Encounter to establish care with new doctor   [facility] Lebron Conners D, [place]   4 years ago Abnormal weight gain   [facility] [place], Satira Anis, MD   5 years ago Rash and nonspecific skin eruption   [facility] Bobetta Lime, MD       Future Appointments             In 2 weeks Delsa Grana, PA-C [facility], [place]   In 3 months Ralene Bathe, MD [place]   In 4 months  [facility], [facility]               Insulin Pen Needle 33G X 4 MM MISC 200 each 0    Sig: 1 Dose by Does not apply route 3 (three) times daily before meals.      Endocrinology: Diabetes - Testing Supplies Passed - 08/13/2020  2:13 PM      Passed - Valid encounter within last 12 months    Recent Outpatient Visits           1 month ago Need for pneumococcal vaccination   [place], [place]   3 months ago Upper respiratory tract infection, unspecified type   [facility] Towanda Malkin, MD   11 months ago Encounter to establish care with new doctor   [facility] Lebron Conners D, MD   4 years ago Abnormal weight gain   [place], MD   5 years ago Rash and nonspecific skin eruption   [facility] Bobetta Lime, MD       Future Appointments             In 2 weeks Delsa Grana, PA-C [facility], [place]   In 3 months Ralene Bathe, MD [place]   In 4 months  [facility], [facility]

## 2020-08-13 NOTE — Telephone Encounter (Signed)
Medication Refill - Medication: Lancets, needles, test strips   Has the patient contacted their pharmacy? Yes.   Pt states that this was prescribed by hospital doctor and is needing to have these refilled due to being completely out. Please advise.  (Agent: If no, request that the patient contact the pharmacy for the refill.) (Agent: If yes, when and what did the pharmacy advise?)  Preferred Pharmacy (with phone number or street name):  [id number] - [place], [place] - [address]  [address] [place] [address]  Phone: [phone] Fax: [phone]  Hours: Not open 24 hours     Agent: Please be advised that RX refills may take up to 3 business days. We ask that you follow-up with your pharmacy.

## 2020-08-23 ENCOUNTER — Telehealth: Payer: Self-pay | Admitting: Internal Medicine

## 2020-08-23 ENCOUNTER — Other Ambulatory Visit: Payer: Self-pay | Admitting: Internal Medicine

## 2020-08-23 NOTE — Telephone Encounter (Signed)
Medication: accu check guide test strips Original prescriber was the hospitalist. Pt has had a HFU apt.  Has the patient contacted their pharmacy? YES  (Agent: If no, request that the patient contact the pharmacy for the refill.) (Agent: If yes, when and what did the pharmacy advise?)  Preferred Pharmacy (with phone number or street name): [id number] - , [place] - [address] [place] [address] Phone: [phone] Fax: [phone] Hours: Not open 24 hours    Agent: Please be advised that RX refills may take up to 3 business days. We ask that you follow-up with your pharmacy.

## 2020-08-23 NOTE — Telephone Encounter (Signed)
  Notes to clinic: script filled by a different provider  Review for continued use and refill    Requested Prescriptions  Pending Prescriptions Disp Refills   blood glucose meter kit and supplies KIT 1 each 0    Sig: Dispense based on patient and insurance preference. Use up to four times daily as directed.      Endocrinology: Diabetes - Testing Supplies Passed - 08/23/2020  2:02 PM      Passed - Valid encounter within last 12 months    Recent Outpatient Visits           1 month ago Need for pneumococcal vaccination   [place], [place]   4 months ago Upper respiratory tract infection, unspecified type   [facility] Towanda Malkin, [place]   1 year ago Encounter to establish care with new doctor   [place], [place]   4 years ago Abnormal weight gain   [place], [place]   5 years ago Rash and nonspecific skin eruption   [facility] Bobetta Lime, MD       Future Appointments             In 3 weeks Delsa Grana, PA-C [facility], [place]   In 3 months Ralene Bathe, MD [place]   In 3 months  [facility], [facility]

## 2020-08-23 NOTE — Telephone Encounter (Signed)
Last seen on 3.23.2022 and next appt is sch'd for 6.7.2022 with [facility]

## 2020-08-28 ENCOUNTER — Ambulatory Visit: Payer: Medicare Other | Admitting: Family Medicine

## 2020-09-16 ENCOUNTER — Ambulatory Visit: Payer: Medicare Other | Admitting: Unknown Physician Specialty

## 2020-09-17 ENCOUNTER — Ambulatory Visit: Payer: Medicare Other | Admitting: Family Medicine

## 2020-10-01 ENCOUNTER — Ambulatory Visit: Payer: Medicare Other | Admitting: Unknown Physician Specialty

## 2020-10-01 ENCOUNTER — Other Ambulatory Visit: Payer: Self-pay | Admitting: Family Medicine

## 2020-10-01 MED ORDER — INSULIN PEN NEEDLE 33G X 4 MM MISC: 1.0000 | Freq: Three times a day (TID) | 0 refills | Status: DC

## 2020-10-01 NOTE — Telephone Encounter (Signed)
Pt has an appt on 10/15/20

## 2020-10-01 NOTE — Telephone Encounter (Signed)
Copied from [place] [id number]. Topic: Quick Communication - Rx Refill/Question >> Oct 01, 2020  8:02 AM Leward Quan A wrote: Medication: Insulin Pen Needle 33G X 4 MM MISC, Accu-Chek Softclix lancets, Accu-Chek Guide test strips  Has the patient contacted their pharmacy? Yes.   (Agent: If no, request that the patient contact the pharmacy for the refill.) (Agent: If yes, when and what did the pharmacy advise?)  Preferred Pharmacy (with phone number or street name): [id number] [place], [place] - [address]  Phone:  [phone] Fax:  [phone]     Agent: Please be advised that RX refills may take up to 3 business days. We ask that you follow-up with your pharmacy.

## 2020-10-01 NOTE — Telephone Encounter (Signed)
  Notes to clinic: Also requesting Accu-Chek Softclix lancets, Accu-Chek Guide test Not on current list    Requested Prescriptions  Pending Prescriptions Disp Refills   Insulin Pen Needle 33G X 4 MM MISC 200 each 0    Sig: 1 Dose by Does not apply route 3 (three) times daily before meals.      Endocrinology: Diabetes - Testing Supplies Passed - 10/01/2020  8:08 AM      Passed - Valid encounter within last 12 months    Recent Outpatient Visits           3 months ago Need for pneumococcal vaccination   [place], [place]   5 months ago Upper respiratory tract infection, unspecified type   [facility] Towanda Malkin, MD   1 year ago Encounter to establish care with new doctor   [place], MD   4 years ago Abnormal weight gain   [place], MD   5 years ago Rash and nonspecific skin eruption   [facility] Bobetta Lime, MD       Future Appointments             In 2 weeks Kathrine Haddock, NP [facility], [place]   In 1 month Ralene Bathe, MD [place]   In 2 months  [facility], [facility]

## 2020-10-03 ENCOUNTER — Ambulatory Visit: Payer: Medicare Other | Admitting: Unknown Physician Specialty

## 2020-10-07 DIAGNOSIS — M5416 Radiculopathy, lumbar region: Secondary | ICD-10-CM | POA: Diagnosis not present

## 2020-10-07 DIAGNOSIS — M545 Low back pain, unspecified: Secondary | ICD-10-CM | POA: Diagnosis not present

## 2020-10-10 DIAGNOSIS — M545 Low back pain, unspecified: Secondary | ICD-10-CM | POA: Diagnosis not present

## 2020-10-11 DIAGNOSIS — M545 Low back pain, unspecified: Secondary | ICD-10-CM | POA: Diagnosis not present

## 2020-10-15 ENCOUNTER — Ambulatory Visit: Payer: Medicare Other | Admitting: Unknown Physician Specialty

## 2020-10-18 DIAGNOSIS — M545 Low back pain, unspecified: Secondary | ICD-10-CM | POA: Diagnosis not present

## 2020-10-23 ENCOUNTER — Other Ambulatory Visit: Payer: Self-pay

## 2020-10-23 ENCOUNTER — Ambulatory Visit (INDEPENDENT_AMBULATORY_CARE_PROVIDER_SITE_OTHER): Payer: Medicare Other | Admitting: Unknown Physician Specialty

## 2020-10-23 ENCOUNTER — Encounter: Payer: Self-pay | Admitting: Unknown Physician Specialty

## 2020-10-23 VITALS — BP 124/78 | HR 98 | Temp 98.3°F | Resp 18 | Ht 60.0 in | Wt 207.9 lb

## 2020-10-23 DIAGNOSIS — Z794 Long term (current) use of insulin: Secondary | ICD-10-CM

## 2020-10-23 DIAGNOSIS — E1165 Type 2 diabetes mellitus with hyperglycemia: Secondary | ICD-10-CM | POA: Diagnosis not present

## 2020-10-23 DIAGNOSIS — E1169 Type 2 diabetes mellitus with other specified complication: Secondary | ICD-10-CM

## 2020-10-23 DIAGNOSIS — R635 Abnormal weight gain: Secondary | ICD-10-CM

## 2020-10-23 LAB — POCT GLYCOSYLATED HEMOGLOBIN (HGB A1C): Hemoglobin A1C: 6.6 % — AB (ref 4.0–5.6)

## 2020-10-23 MED ORDER — METFORMIN HCL 1000 MG PO TABS: ORAL_TABLET | ORAL | 0 refills | Status: DC

## 2020-10-23 MED ORDER — OZEMPIC (0.25 OR 0.5 MG/DOSE) 2 MG/1.5ML ~~LOC~~ SOPN: 0.5000 mg | PEN_INJECTOR | SUBCUTANEOUS | 12 refills | Status: DC

## 2020-10-23 NOTE — Assessment & Plan Note (Signed)
Hgb A1C is downto 6.6 but unfortunately is gaining weight.  Stop mealtime insulin.  Start Ozempic and .25 for 2 weeks then increase to 1/2 mg.  Decrease Lantus to 25 units with titration schedule: Base your long acting insulin on your fasting (usually in the morning) blood sugar.  Increase long acting (daily insulin) 2 units if fasting blood sugar is greater than 120.  Decrease by 2 units if fasting blood sugar is less than 95.    Check microalbumin.  Discussed statin use and refusing for now

## 2020-10-23 NOTE — Patient Instructions (Signed)
Base your long acting insulin on your fasting (usually in the morning) blood sugar.  Increase long acting (daily insulin) 2 units if fasting blood sugar is greater than 120.  Decrease by 2 units if fasting blood sugar is less than 95.     Stop mealtime insulin

## 2020-10-23 NOTE — Progress Notes (Signed)
BP 124/78   Pulse 98   Temp 98.3 F (36.8 C) (Oral)   Resp 18   Ht 5' (1.524 m)   Wt 207 lb 14.4 oz (94.3 kg)   SpO2 97%   BMI 40.60 kg/m    Subjective:    Patient ID: Donna Haas, female    DOB: [birth year], 45 y.o.   MRN: [id number]  HPI: Donna Haas is a 45 y.o. female  Chief Complaint  Patient presents with   Diabetes   Diabetes: Takes 36 u of Lantus and 7 units of Novolog only if blood sugar is over 120.   Using medications without difficulties No hypoglycemic episodes No hyperglycemic episodes Feet problems: Blood Sugars averaging:High in the AM of upper 100 to 200s, Noon it is better and takes Novolog just at times in the evening eye exam within last year Last Hgb A1C: 12.0 Today it is 6.6  Hypertension  States she has had "low blood pressure" issues Average home BPs: Not checking   On no medications and needs microalbumin  The 10-year ASCVD risk score Mikey Bussing DC Jr., et al., 2013) is: 1.6%   Values used to calculate the score:     Age: 30 years     Sex: Female     Is Non-Hispanic African American: No     Diabetic: Yes     Tobacco smoker: No     Systolic Blood Pressure: 016 mmHg     Is BP treated: No     HDL Cholesterol: 46 mg/dL     Total Cholesterol: 196 mg/dL   Lupus ANA negative but says it is only positive right before a flare.  Gets flares every 1-2 months with fatigue, butterfly rash, and myalgias.  Saw a rheumatologist in 2002 at [facility].  On Plaquenil at one time but developed visual changes  Depression screen [facility] 2/9 10/23/2020 07/03/2020 04/24/2020 12/12/2019 08/23/2019  Decreased Interest 0 0 0 0 0  Down, Depressed, Hopeless 0 0 0 0 0  PHQ - 2 Score 0 0 0 0 0  Altered sleeping 0 0 - - 0  Tired, decreased energy 0 0 - - 0  Change in appetite 0 0 - - 1  Feeling bad or failure about yourself  0 0 - - 0  Trouble concentrating 0 0 - - 0  Moving slowly or fidgety/restless 0 0 - - 0  Suicidal thoughts 0 0 - - 0  PHQ-9 Score 0 0 - - 1   Difficult doing work/chores Not difficult at all Not difficult at all - - Not difficult at all        Relevant past medical, surgical, family and social history reviewed and updated as indicated. Interim medical history since our last visit reviewed. Allergies and medications reviewed and updated.  Review of Systems  Per HPI unless specifically indicated above     Objective:    BP 124/78   Pulse 98   Temp 98.3 F (36.8 C) (Oral)   Resp 18   Ht 5' (1.524 m)   Wt 207 lb 14.4 oz (94.3 kg)   SpO2 97%   BMI 40.60 kg/m   Wt Readings from Last 3 Encounters:  10/23/20 207 lb 14.4 oz (94.3 kg)  07/03/20 201 lb 3.2 oz (91.3 kg)  06/14/20 199 lb (90.3 kg)    Physical Exam Constitutional:      General: She is not in acute distress.    Appearance: Normal appearance. She is well-developed.  HENT:  Head: Normocephalic and atraumatic.  Eyes:     General: Lids are normal. No scleral icterus.       Right eye: No discharge.        Left eye: No discharge.     Conjunctiva/sclera: Conjunctivae normal.  Neck:     Vascular: No carotid bruit or JVD.  Cardiovascular:     Rate and Rhythm: Normal rate and regular rhythm.     Heart sounds: Normal heart sounds.  Pulmonary:     Effort: Pulmonary effort is normal. No respiratory distress.     Breath sounds: Normal breath sounds.  Abdominal:     Palpations: There is no hepatomegaly or splenomegaly.  Musculoskeletal:        General: Normal range of motion.     Cervical back: Normal range of motion and neck supple.  Skin:    General: Skin is warm and dry.     Coloration: Skin is not pale.     Findings: No rash.  Neurological:     Mental Status: She is alert and oriented to person, place, and time.  Psychiatric:        Behavior: Behavior normal.        Thought Content: Thought content normal.        Judgment: Judgment normal.    Results for orders placed or performed in visit on 10/23/20  POCT HgB A1C  Result Value Ref Range    Hemoglobin A1C 6.6 (A) 4.0 - 5.6 %   HbA1c POC (<> result, manual entry)     HbA1c, POC (prediabetic range)     HbA1c, POC (controlled diabetic range)        Assessment & Plan:   Problem List Items Addressed This Visit       Unprioritized   Diabetes mellitus with hyperglycemia (HCC)    Hgb A1C is downto 6.6 but unfortunately is gaining weight.  Stop mealtime insulin.  Start Ozempic and .25 for 2 weeks then increase to 1/2 mg.  Decrease Lantus to 25 units with titration schedule: Base your long acting insulin on your fasting (usually in the morning) blood sugar.  Increase long acting (daily insulin) 2 units if fasting blood sugar is greater than 120.  Decrease by 2 units if fasting blood sugar is less than 95.    Check microalbumin.  Discussed statin use and refusing for now        Relevant Medications   Semaglutide,0.25 or 0.5MG /DOS, (OZEMPIC, 0.25 OR 0.5 MG/DOSE,) 2 MG/1.5ML SOPN   metFORMIN (GLUCOPHAGE) 1000 MG tablet   Other Relevant Orders   Amb Referral to Nutrition and Diabetic Education   Other Visit Diagnoses     Type 2 diabetes mellitus with other specified complication, without long-term current use of insulin ([place])    -  Primary   Relevant Medications   Semaglutide,0.25 or 0.5MG /DOS, (OZEMPIC, 0.25 OR 0.5 MG/DOSE,) 2 MG/1.5ML SOPN   metFORMIN (GLUCOPHAGE) 1000 MG tablet   Other Relevant Orders   POCT HgB A1C (Completed)   Amb Referral to Nutrition and Diabetic Education       Refer to lifestyle center  Follow up plan: Return in about 4 weeks (around 11/20/2020).

## 2020-10-24 ENCOUNTER — Encounter: Payer: Self-pay | Admitting: Unknown Physician Specialty

## 2020-10-24 LAB — COMPLETE METABOLIC PANEL WITHOUT GFR
AG Ratio: 1.7 (calc) (ref 1.0–2.5)
ALT: 50 U/L — ABNORMAL HIGH (ref 6–29)
AST: 36 U/L — ABNORMAL HIGH (ref 10–30)
Albumin: 4.2 g/dL (ref 3.6–5.1)
Alkaline phosphatase (APISO): 109 U/L (ref 31–125)
BUN: 7 mg/dL (ref 7–25)
CO2: 26 mmol/L (ref 20–32)
Calcium: 9.4 mg/dL (ref 8.6–10.2)
Chloride: 102 mmol/L (ref 98–110)
Creat: 0.74 mg/dL (ref 0.50–0.99)
Globulin: 2.5 g/dL (ref 1.9–3.7)
Glucose, Bld: 159 mg/dL — ABNORMAL HIGH (ref 65–99)
Potassium: 4.8 mmol/L (ref 3.5–5.3)
Sodium: 139 mmol/L (ref 135–146)
Total Bilirubin: 0.3 mg/dL (ref 0.2–1.2)
Total Protein: 6.7 g/dL (ref 6.1–8.1)
eGFR: 104 mL/min/{1.73_m2}

## 2020-10-24 LAB — MICROALBUMIN, URINE: Microalb, Ur: 0.3 mg/dL

## 2020-10-24 LAB — LIPID PANEL
Cholesterol: 191 mg/dL
HDL: 43 mg/dL — ABNORMAL LOW
LDL Cholesterol (Calc): 114 mg/dL — ABNORMAL HIGH
Non-HDL Cholesterol (Calc): 148 mg/dL — ABNORMAL HIGH
Total CHOL/HDL Ratio: 4.4 (calc)
Triglycerides: 227 mg/dL — ABNORMAL HIGH

## 2020-10-24 LAB — TSH: TSH: 2.21 m[IU]/L

## 2020-10-28 ENCOUNTER — Telehealth: Payer: Self-pay

## 2020-10-28 NOTE — Telephone Encounter (Signed)
Patient picked up sample: what are the direction for use on Mounjar?

## 2020-10-28 NOTE — Telephone Encounter (Signed)
New meds

## 2020-11-12 ENCOUNTER — Ambulatory Visit: Payer: Medicare Other | Admitting: Unknown Physician Specialty

## 2020-11-12 ENCOUNTER — Ambulatory Visit: Payer: Medicare Other | Admitting: Family Medicine

## 2020-11-20 ENCOUNTER — Encounter: Payer: Self-pay | Admitting: Family Medicine

## 2020-11-20 ENCOUNTER — Other Ambulatory Visit: Payer: Self-pay

## 2020-11-20 ENCOUNTER — Other Ambulatory Visit: Payer: Self-pay | Admitting: Family Medicine

## 2020-11-20 ENCOUNTER — Ambulatory Visit (INDEPENDENT_AMBULATORY_CARE_PROVIDER_SITE_OTHER): Payer: Medicare Other | Admitting: Family Medicine

## 2020-11-20 VITALS — BP 130/84 | HR 95 | Temp 98.0°F | Resp 16 | Ht 60.0 in | Wt 198.0 lb

## 2020-11-20 DIAGNOSIS — E041 Nontoxic single thyroid nodule: Secondary | ICD-10-CM | POA: Diagnosis not present

## 2020-11-20 DIAGNOSIS — E1169 Type 2 diabetes mellitus with other specified complication: Secondary | ICD-10-CM | POA: Diagnosis not present

## 2020-11-20 DIAGNOSIS — E669 Obesity, unspecified: Secondary | ICD-10-CM

## 2020-11-20 DIAGNOSIS — M329 Systemic lupus erythematosus, unspecified: Secondary | ICD-10-CM | POA: Diagnosis not present

## 2020-11-20 DIAGNOSIS — E538 Deficiency of other specified B group vitamins: Secondary | ICD-10-CM | POA: Insufficient documentation

## 2020-11-20 DIAGNOSIS — N970 Female infertility associated with anovulation: Secondary | ICD-10-CM | POA: Insufficient documentation

## 2020-11-20 DIAGNOSIS — E119 Type 2 diabetes mellitus without complications: Secondary | ICD-10-CM

## 2020-11-20 DIAGNOSIS — G43009 Migraine without aura, not intractable, without status migrainosus: Secondary | ICD-10-CM | POA: Insufficient documentation

## 2020-11-20 DIAGNOSIS — M5412 Radiculopathy, cervical region: Secondary | ICD-10-CM | POA: Insufficient documentation

## 2020-11-20 MED ORDER — FREESTYLE LIBRE 2 SENSOR MISC: 1.0000 | 1 refills | Status: DC

## 2020-11-20 NOTE — Telephone Encounter (Signed)
Requested medications are due for refill today.  New prescription.  Requested medications are on the active medications list.    Last refill.   Future visit scheduled.  yes  Notes to clinic.  This device is not covered by insurance. Pt is requesting a different monitor that is covered by her ins. PCP listed is Dr. Roxan Hockey.

## 2020-11-20 NOTE — Progress Notes (Signed)
Name: Donna Haas   MRN: [id number]    DOB: 15-Dec-1975   Date:11/20/2020       Progress Note  Subjective  Chief Complaint  Follow Up  HPI  DMII: she was seen by Lona Millard NP in June, her A1C was at goal  At 6.6 % , she was started on Ozempic and advised to wean off Lantus. She was also given Free [employer] but insurance only pays for the [place] 2. Glucose at home has been between 110-150 and she is still using Lantus 23 units per day. She is on second week of Ozempic 0.5 mg, she has noticed that it curbs her appetite and lost 10 lbs since last visit. She felt dizzy once after he last dose of Ozempic otherwise not other side effects. She denies polyphagia, polydipsia or polyuria. Reviewed labs with patient. Also reviewed her chart and there was a history of pancreatitis - she states nobody every told her about it, it may have been secondary to choledocholithiasis. Explained increase risk of pancreatitis with GLP-1 agonists.  Morbid obesity: she states ozempic is helping curb her appetite and has lost weight   Lupus: diagnosed years ago, no rashes at this time, feels achy and has joint aches. She used to go to [place] but not in years, Dr. Sanda Klein treated her with Plaquenil but it caused vision disturbances. We will refer her back to rheumatologist   Thyroid nodule: used to see Dr. Beverly Milch and also lost to follow up, TSH within normal limits    Past Surgical History:  Procedure Laterality Date   APPENDECTOMY     CESAREAN SECTION     x2   [place] Bilateral 08/30/2015   Procedure: CESAREAN SECTION WITH BILATERAL TUBAL LIGATION;  Surgeon: Honor Loh Ward, MD;  Location: [facility] ORS;  Service: Obstetrics;  Laterality: Bilateral;   CHOLECYSTECTOMY     LAPAROSCOPY     x3   THYROID CYST EXCISION     Dr. Trena Platt, [place]    Family History  Problem Relation Age of Onset   Depression Mother        Bi-polar   Asthma Mother    Allergies Mother     Sleep apnea Mother    Hyperlipidemia Other    Hypertension Other    Heart disease Other    Allergies Sister    Diabetes Sister    Rheum arthritis Maternal Grandmother     Social History   Tobacco Use   Smoking status: Never   Smokeless tobacco: Never  Substance Use Topics   Alcohol use: No    Alcohol/week: 1.0 - 2.0 standard drink    Types: 1 - 2 Standard drinks or equivalent per week    Comment: discontinued use during pregnancy     Current Outpatient Medications:    ALPRAZolam (XANAX) 1 MG tablet, Take 1 mg by mouth daily as needed., Disp: , Rfl:    clonazePAM (KLONOPIN) 1 MG tablet, Take 1 mg by mouth 3 (three) times daily as needed for anxiety., Disp: , Rfl:    Continuous Blood Gluc Sensor (FREESTYLE LIBRE 2 SENSOR) MISC, 1 each by Does not apply route as directed., Disp: 9 each, Rfl: 1   dextroamphetamine (DEXTROSTAT) 10 MG tablet, Take 10 mg by mouth daily., Disp: , Rfl:    escitalopram (LEXAPRO) 20 MG tablet, Take 10 tablets by mouth daily., Disp: , Rfl:    esomeprazole (NEXIUM) 20 MG capsule, Take 20 mg by mouth daily. ,  Disp: , Rfl:    ferrous sulfate 325 (65 FE) MG tablet, Take 325 mg by mouth daily., Disp: , Rfl:    insulin glargine (LANTUS) 100 UNIT/ML Solostar Pen, Inject 36 Units into the skin daily., Disp: 15 mL, Rfl: 1   Insulin Pen Needle 33G X 4 MM MISC, 1 Dose by Does not apply route 3 (three) times daily before meals., Disp: 200 each, Rfl: 0   loratadine (CLARITIN) 10 MG tablet, Take 10 mg by mouth daily., Disp: , Rfl:    Semaglutide,0.25 or 0.5MG/DOS, (OZEMPIC, 0.25 OR 0.5 MG/DOSE,) 2 MG/1.5ML SOPN, Inject 0.5 mg into the skin once a week. Use weekly.  For first 2 weeks use an initial dose of .25 mg and then increase to 1/2 mg. Decrease insulin to 25 units in the evening., Disp: 1.5 mL, Rfl: 12   zolpidem (AMBIEN) 10 MG tablet, Take 10 mg by mouth at bedtime., Disp: , Rfl:   Allergies  Allergen Reactions   Compazine [Prochlorperazine] Anaphylaxis    Prednisone Anaphylaxis   Prochlorperazine Edisylate Anaphylaxis   Etodolac     Other reaction(s): Unknown   Hydrocodone-Acetaminophen Nausea Only   Morphine And Related Nausea Only and Other (See Comments)    Mood swings, headache    I personally reviewed active problem list, medication list, allergies, family history, social history, health maintenance with the patient/caregiver today.   ROS  Constitutional: Negative for fever , positive for  weight change.  Respiratory: Negative for cough and shortness of breath.   Cardiovascular: Negative for chest pain or palpitations.  Gastrointestinal: Negative for abdominal pain, no bowel changes.  Musculoskeletal: Negative for gait problem or joint swelling.  Skin: Negative for rash.  Neurological: Negative for dizziness or headache.  No other specific complaints in a complete review of systems (except as listed in HPI above).   Objective  Vitals:   11/20/20 1511  BP: 130/84  Pulse: 95  Resp: 16  Temp: 98 F (36.7 C)  SpO2: 98%  Weight: 198 lb (89.8 kg)  Height: 5' (1.524 m)    Body mass index is 38.67 kg/m.  Physical Exam  Constitutional: Patient appears well-developed and well-nourished. Obese  No distress.  HEENT: head atraumatic, normocephalic, pupils equal and reactive to light, neck supple Cardiovascular: Normal rate, regular rhythm and normal heart sounds.  No murmur heard. No BLE edema. Pulmonary/Chest: Effort normal and breath sounds normal. No respiratory distress. Abdominal: Soft.  There is no tenderness. Psychiatric: Patient has a normal mood and affect. behavior is normal. Judgment and thought content normal.   Recent Results (from the past 2160 hour(s))  POCT HgB A1C     Status: Abnormal   Collection Time: 10/23/20  1:43 PM  Result Value Ref Range   Hemoglobin A1C 6.6 (A) 4.0 - 5.6 %   HbA1c POC (<> result, manual entry)     HbA1c, POC (prediabetic range)     HbA1c, POC (controlled diabetic range)     COMPLETE METABOLIC PANEL WITH GFR     Status: Abnormal   Collection Time: 10/23/20  2:11 PM  Result Value Ref Range   Glucose, Bld 159 (H) 65 - 99 mg/dL    Comment: .            Fasting reference interval . For someone without known diabetes, a glucose value >125 mg/dL indicates that they may have diabetes and this should be confirmed with a follow-up test. .    BUN 7 7 - 25 mg/dL  Creat 0.74 0.50 - 0.99 mg/dL   eGFR 104 > OR = 60 mL/min/1.82m    Comment: The eGFR is based on the CKD-EPI 2021 equation. To calculate  the new eGFR from a previous Creatinine or Cystatin C result, go to https://www.kidney.org/professionals/ kdoqi/gfr%5Fcalculator    BUN/Creatinine Ratio NOT APPLICABLE 6 - 22 (calc)   Sodium 139 135 - 146 mmol/L   Potassium 4.8 3.5 - 5.3 mmol/L   Chloride 102 98 - 110 mmol/L   CO2 26 20 - 32 mmol/L   Calcium 9.4 8.6 - 10.2 mg/dL   Total Protein 6.7 6.1 - 8.1 g/dL   Albumin 4.2 3.6 - 5.1 g/dL   Globulin 2.5 1.9 - 3.7 g/dL (calc)   AG Ratio 1.7 1.0 - 2.5 (calc)   Total Bilirubin 0.3 0.2 - 1.2 mg/dL   Alkaline phosphatase (APISO) 109 31 - 125 U/L   AST 36 (H) 10 - 30 U/L   ALT 50 (H) 6 - 29 U/L  Lipid panel     Status: Abnormal   Collection Time: 10/23/20  2:11 PM  Result Value Ref Range   Cholesterol 191 <200 mg/dL   HDL 43 (L) > OR = 50 mg/dL   Triglycerides 227 (H) <150 mg/dL    Comment: . If a non-fasting specimen was collected, consider repeat triglyceride testing on a fasting specimen if clinically indicated.  JYates Decampet al. J. of Clin. Lipidol. 22633;3:545-625 .Marland Kitchen   LDL Cholesterol (Calc) 114 (H) mg/dL (calc)    Comment: Reference range: <100 . Desirable range <100 mg/dL for primary prevention;   <70 mg/dL for patients with CHD or diabetic patients  with > or = 2 CHD risk factors. .Marland KitchenLDL-C is now calculated using the Martin-Hopkins  calculation, which is a validated novel method providing  better accuracy than the Friedewald equation in the   estimation of LDL-C.  MCresenciano Genreet al. JAnnamaria Helling 26389;373(42: 2061-2068  (http://education.[employer].com/faq/FAQ164)    Total CHOL/HDL Ratio 4.4 <5.0 (calc)   Non-HDL Cholesterol (Calc) 148 (H) <130 mg/dL (calc)    Comment: For patients with diabetes plus 1 major ASCVD risk  factor, treating to a non-HDL-C goal of <100 mg/dL  (LDL-C of <70 mg/dL) is considered a therapeutic  option.   TSH     Status: None   Collection Time: 10/23/20  2:11 PM  Result Value Ref Range   TSH 2.21 mIU/L    Comment:           Reference Range .           > or = 20 Years  0.40-4.50 .                Pregnancy Ranges           First trimester    0.26-2.66           Second trimester   0.55-2.73           Third trimester    0.43-2.91   Microalbumin, urine     Status: None   Collection Time: 10/23/20  2:11 PM  Result Value Ref Range   Microalb, Ur 0.3 mg/dL    Comment: Reference Range Not established    RAM      Comment: . The ADA defines abnormalities in albumin excretion as follows: .Marland KitchenAlbuminuria Category         Result (mcg/mg creatinine) . Normal to Mildly increased    <30 Moderately increased  30-299  Severely increased            > OR = 300 . The ADA recommends that at least two of three specimens collected within a 3-6 month period be abnormal before considering a patient to be within a diagnostic category.       PHQ2/9: Depression screen [facility] 2/9 11/20/2020 10/23/2020 07/03/2020 04/24/2020 12/12/2019  Decreased Interest 0 0 0 0 0  Down, Depressed, Hopeless 1 0 0 0 0  PHQ - 2 Score 1 0 0 0 0  Altered sleeping 1 0 0 - -  Tired, decreased energy 0 0 0 - -  Change in appetite 0 0 0 - -  Feeling bad or failure about yourself  0 0 0 - -  Trouble concentrating 3 0 0 - -  Moving slowly or fidgety/restless 0 0 0 - -  Suicidal thoughts 0 0 0 - -  PHQ-9 Score 5 0 0 - -  Difficult doing work/chores - Not difficult at all Not difficult at all - -    phq 9 is positive   Fall  Risk: Fall Risk  11/20/2020 10/23/2020 07/03/2020 04/24/2020 12/12/2019  Falls in the past year? 1 1 0 1 1  Number falls in past yr: 1 1 0 0 1  Injury with Fall? 1 1 0 0 0  Risk for fall due to : - History of fall(s) - - History of fall(s)  Follow up - Falls evaluation completed - - Falls prevention discussed      Functional Status Survey: Is the patient deaf or have difficulty hearing?: Yes Does the patient have difficulty seeing, even when wearing glasses/contacts?: No Does the patient have difficulty concentrating, remembering, or making decisions?: Yes Does the patient have difficulty walking or climbing stairs?: Yes Does the patient have difficulty dressing or bathing?: No Does the patient have difficulty doing errands alone such as visiting a doctor's office or shopping?: No    Assessment & Plan  1. Diabetes mellitus type 2 in obese (HCC)  - Continuous Blood Gluc Sensor (FREESTYLE LIBRE 2 SENSOR) MISC; 1 each by Does not apply route as directed.  Dispense: 9 each; Refill: 1  2. Right thyroid nodule  - Ambulatory referral to ENT  3. Morbid obesity ([place])  Discussed with the patient the risk posed by an increased BMI. Discussed importance of portion control, calorie counting and at least 150 minutes of physical activity weekly. Avoid sweet beverages and drink more water. Eat at least 6 servings of fruit and vegetables daily     4. Systemic lupus erythematosus, unspecified SLE type, unspecified organ involvement status ([place])  - Ambulatory referral to Rheumatology

## 2020-11-20 NOTE — Patient Instructions (Signed)
Go down by 2 units every 3 days to keep glucose between 100-140, if below 100 for 2-3 days stop taking insulin

## 2020-11-21 ENCOUNTER — Other Ambulatory Visit: Payer: Self-pay | Admitting: Family Medicine

## 2020-11-21 DIAGNOSIS — E119 Type 2 diabetes mellitus without complications: Secondary | ICD-10-CM

## 2020-11-21 DIAGNOSIS — E669 Obesity, unspecified: Secondary | ICD-10-CM

## 2020-11-21 DIAGNOSIS — E1169 Type 2 diabetes mellitus with other specified complication: Secondary | ICD-10-CM

## 2020-11-21 MED ORDER — XIGDUO XR 10-1000 MG PO TB24: 1.0000 | ORAL_TABLET | Freq: Every day | ORAL | 0 refills | Status: DC

## 2020-11-21 MED ORDER — FREESTYLE LIBRE 2 SENSOR MISC: 1.0000 | 5 refills | Status: DC

## 2020-11-22 ENCOUNTER — Other Ambulatory Visit: Payer: Self-pay | Admitting: Family Medicine

## 2020-11-22 DIAGNOSIS — E1169 Type 2 diabetes mellitus with other specified complication: Secondary | ICD-10-CM

## 2020-11-22 DIAGNOSIS — E669 Obesity, unspecified: Secondary | ICD-10-CM

## 2020-11-22 DIAGNOSIS — E041 Nontoxic single thyroid nodule: Secondary | ICD-10-CM

## 2020-11-22 DIAGNOSIS — E119 Type 2 diabetes mellitus without complications: Secondary | ICD-10-CM

## 2020-11-22 MED ORDER — BLOOD GLUCOSE METER KIT: PACK | 0 refills | Status: AC

## 2020-11-27 ENCOUNTER — Ambulatory Visit: Payer: Medicare Other | Admitting: Dermatology

## 2020-12-11 ENCOUNTER — Other Ambulatory Visit: Payer: Self-pay | Admitting: Family Medicine

## 2020-12-11 MED ORDER — METFORMIN HCL ER 750 MG PO TB24: 1500.0000 mg | ORAL_TABLET | Freq: Every day | ORAL | 0 refills | Status: DC

## 2020-12-12 ENCOUNTER — Ambulatory Visit: Payer: Medicare Other

## 2020-12-18 ENCOUNTER — Other Ambulatory Visit: Payer: Self-pay | Admitting: Family Medicine

## 2021-01-11 ENCOUNTER — Other Ambulatory Visit: Payer: Self-pay | Admitting: Unknown Physician Specialty

## 2021-01-11 DIAGNOSIS — E1169 Type 2 diabetes mellitus with other specified complication: Secondary | ICD-10-CM

## 2021-01-11 NOTE — Telephone Encounter (Signed)
dc'd 11/20/20 Change in therapy

## 2021-01-14 ENCOUNTER — Ambulatory Visit (INDEPENDENT_AMBULATORY_CARE_PROVIDER_SITE_OTHER): Payer: Medicare Other

## 2021-01-14 DIAGNOSIS — Z0000 Encounter for general adult medical examination without abnormal findings: Secondary | ICD-10-CM

## 2021-01-14 NOTE — Progress Notes (Signed)
Subjective:   Donna Haas is a 45 y.o. female who presents for Medicare Annual (Subsequent) preventive examination.  Virtual Visit via Telephone Note  I connected with  Donna Haas on 01/14/21 at  2:50 PM EDT by telephone and verified that I am speaking with the correct person using two identifiers.  Location: Patient: home Provider: [place] Persons participating in the virtual visit: [place]   I discussed the limitations, risks, security and privacy concerns of performing an evaluation and management service by telephone and the availability of in person appointments. The patient expressed understanding and agreed to proceed.  Interactive audio and video telecommunications were attempted between this nurse and patient, however failed, due to patient having technical difficulties OR patient did not have access to video capability.  We continued and completed visit with audio only.  Some vital signs may be absent or patient reported.   Clemetine Marker, LPN   Review of Systems     Cardiac Risk Factors include: obesity (BMI >30kg/m2);diabetes mellitus     Objective:    Today's Vitals   01/14/21 1452  PainSc: 3    There is no height or weight on file to calculate BMI.  Advanced Directives 01/14/2021 06/14/2020 12/12/2019 01/30/2016 01/21/2016 08/30/2015 08/29/2015  Does Patient Have a Medical Advance Directive? _0  No No  Would patient like information on creating a medical advance directive? Yes (MAU/Ambulatory/Procedural Areas - Information given) No - Patient declined Yes (MAU/Ambulatory/Procedural Areas - Information given) No - patient declined information No - patient declined information No - patient declined information -    Current Medications (verified) Outpatient Encounter Medications as of 01/14/2021  Medication Sig   ACCU-CHEK GUIDE test strip USE UP TO 4 TIMES DAILY AS DIRECTED   Accu-Chek Softclix Lancets lancets SMARTSIG:Topical 1-4  Times Daily   ALPRAZolam (XANAX) 1 MG tablet Take 1 mg by mouth daily as needed.   BD PEN NEEDLE NANO 2ND GEN 32G X 4 MM MISC 1 DOSE BY DOES NOT APPLY ROUTE 3 (THREE) TIMES DAILY BEFORE MEALS.   blood glucose meter kit and supplies Dispense based on patient and insurance preference. Use up to four times daily as directed. (FOR ICD-10 E10.9, E11.9).   clonazePAM (KLONOPIN) 1 MG tablet Take 1 mg by mouth 3 (three) times daily as needed for anxiety.   escitalopram (LEXAPRO) 20 MG tablet Take 10 tablets by mouth daily.   esomeprazole (NEXIUM) 20 MG capsule Take 20 mg by mouth daily.    ferrous sulfate 325 (65 FE) MG tablet Take 325 mg by mouth daily.   LANTUS SOLOSTAR 100 UNIT/ML Solostar Pen INJECT 36 UNITS INTO THE SKIN DAILY.   loratadine (CLARITIN) 10 MG tablet Take 10 mg by mouth daily.   Magnesium 250 MG TABS Take 1 tablet by mouth daily.   metFORMIN (GLUCOPHAGE XR) 750 MG 24 hr tablet Take 2 tablets (1,500 mg total) by mouth daily with breakfast.   Semaglutide,0.25 or 0.5MG/DOS, (OZEMPIC, 0.25 OR 0.5 MG/DOSE,) 2 MG/1.5ML SOPN Inject 0.5 mg into the skin once a week. Use weekly.  For first 2 weeks use an initial dose of .25 mg and then increase to 1/2 mg. Decrease insulin to 25 units in the evening.   zolpidem (AMBIEN) 10 MG tablet Take 10 mg by mouth at bedtime.   dextroamphetamine (DEXTROSTAT) 10 MG tablet Take 10 mg by mouth daily. (Patient not taking: Reported on 01/14/2021)   [DISCONTINUED] Continuous Blood Gluc Sensor (FREESTYLE LIBRE 2  SENSOR) MISC 1 each by Does not apply route every 14 (fourteen) days.   [DISCONTINUED] Insulin Pen Needle 33G X 4 MM MISC 1 Dose by Does not apply route 3 (three) times daily before meals.   No facility-administered encounter medications on file as of 01/14/2021.    Allergies (verified) Compazine [prochlorperazine], Prednisone, Prochlorperazine edisylate, Etodolac, Farxiga [dapagliflozin], Hydrocodone-acetaminophen, and Morphine and related    History: Past Medical History:  Diagnosis Date   Anxiety    Back pain    Endometriosis    GERD (gastroesophageal reflux disease)    Gestational diabetes    Lumbar herniated disc    Lupus ([place])    Migraines    Morbid obesity ([place]) 01/25/2016   Nephrolithiasis    Obesity    Pancreatitis    Plantar fasciitis    Thyroid cyst    Past Surgical History:  Procedure Laterality Date   APPENDECTOMY     CESAREAN SECTION     x2   CESAREAN SECTION WITH BILATERAL TUBAL LIGATION Bilateral 08/30/2015   Procedure: CESAREAN SECTION WITH BILATERAL TUBAL LIGATION;  Surgeon: Honor Loh Ward, MD;  Location: [facility] ORS;  Service: Obstetrics;  Laterality: Bilateral;   CHOLECYSTECTOMY     LAPAROSCOPY     x3   THYROID CYST EXCISION     Dr. Trena Platt, [place]   Family History  Problem Relation Age of Onset   Depression Mother        Bi-polar   Asthma Mother    Allergies Mother    Sleep apnea Mother    Hyperlipidemia Other    Hypertension Other    Heart disease Other    Allergies Sister    Diabetes Sister    Rheum arthritis Maternal Grandmother    Social History   Socioeconomic History   Marital status: Divorced    Spouse name: Not on file   Number of children: 2   Years of education: Not on file   Highest education level: Associate degree: academic program  Occupational History   Occupation: disability    Employer: UNEMPLOYED  Tobacco Use   Smoking status: Never   Smokeless tobacco: Never  Vaping Use   Vaping Use: Never used  Substance and Sexual Activity   Alcohol use: No    Alcohol/week: 1.0 - 2.0 standard drink    Types: 1 - 2 Standard drinks or equivalent per week    Comment: discontinued use during pregnancy   Drug use: No   Sexual activity: Not Currently    Partners: Male  Other Topics Concern   Not on file  Social History Narrative   Not on file   Social Determinants of Health   Financial Resource Strain: Low Risk    Difficulty of Paying Living Expenses: Not  hard at all  Food Insecurity: No Food Insecurity   Worried About Charity fundraiser in the Last Year: Never true   [place] in the Last Year: Never true  Transportation Needs: No Transportation Needs   Lack of Transportation (Medical): No   Lack of Transportation (Non-Medical): No  Physical Activity: Sufficiently Active   Days of Exercise per Week: 5 days   Minutes of Exercise per Session: 30 min  Stress: No Stress Concern Present   Feeling of Stress : Not at all  Social Connections: Moderately Integrated   Frequency of Communication with Friends and Family: More than three times a week   Frequency of Social Gatherings with Friends and Family: More than three times  a week   Attends Religious Services: More than 4 times per year   Active Member of Clubs or Organizations: Yes   Attends Music therapist: More than 4 times per year   Marital Status: Divorced    Tobacco Counseling Counseling given: Not Answered   Clinical Intake:  Pre-visit preparation completed: Yes  Pain : 0-10 Pain Score: 3  Pain Type: Acute pain Pain Location: Generalized (body aches) Pain Descriptors / Indicators: Aching Pain Onset: In the past 7 days Pain Frequency: Constant     Nutritional Risks: None Diabetes: Yes CBG done?: No Did pt. bring in CBG monitor from home?: No  How often do you need to have someone help you when you read instructions, pamphlets, or other written materials from your doctor or pharmacy?: 1 - Never Nutrition Risk Assessment:  Has the patient had any N/V/D within the last 2 months?  No  Does the patient have any non-healing wounds?  No  Has the patient had any unintentional weight loss or weight gain?  No   Diabetes:  Is the patient diabetic?  Yes  If diabetic, was a CBG obtained today?  No  Did the patient bring in their glucometer from home?  No  How often do you monitor your CBG's? Daily fasting am.   Financial Strains and Diabetes  Management:  Are you having any financial strains with the device, your supplies or your medication? No .  Does the patient want to be seen by Chronic Care Management for management of their diabetes?  No  Would the patient like to be referred to a Nutritionist or for Diabetic Management?  No   Diabetic Exams:  Diabetic Eye Exam:  Overdue for diabetic eye exam. Pt has been advised about the importance in completing this exam. Patient scheduled with [facility] 01/2021  Diabetic Foot Exam: Completed 07/03/20.   Interpreter Needed?: No  Information entered by :: Clemetine Marker LPN   Activities of Daily Living In your present state of health, do you have any difficulty performing the following activities: 01/14/2021 11/20/2020  Hearing? N Y  Vision? N N  Difficulty concentrating or making decisions? Tempie Donning  Walking or climbing stairs? Y Y  Dressing or bathing? N N  Doing errands, shopping? N N  Preparing Food and eating ? N -  Using the Toilet? N -  In the past six months, have you accidently leaked urine? N -  Do you have problems with loss of bowel control? N -  Managing your Medications? N -  Managing your Finances? N -  Housekeeping or managing your Housekeeping? N -  Some recent data might be hidden    Patient Care Team: Steele Sizer, MD as PCP - General (Family Medicine)  Indicate any recent Medical Services you may have received from other than [facility] providers in the past year (date may be approximate).     Assessment:   This is a routine wellness examination for Donna Haas.  Hearing/Vision screen Hearing Screening - Comments:: Pt denies hearing difficulty Vision Screening - Comments:: Annual vision screening scheduled with [facility] 01/2021  Dietary issues and exercise activities discussed: Current Exercise Habits: Home exercise routine, Type of exercise: walking, Time (Minutes): 30, Frequency (Times/Week): 5, Weekly Exercise (Minutes/Week): 150, Intensity:  Mild, Exercise limited by: orthopedic condition(s)   Goals Addressed   None    Depression Screen [facility] 2/9 Scores 01/14/2021 11/20/2020 10/23/2020 07/03/2020 04/24/2020 12/12/2019 08/23/2019  PHQ - 2 Score 0 1  0 0 0 0 0  PHQ- 9 Score 1 5 0 0 - - 1    Fall Risk Fall Risk  01/14/2021 11/20/2020 10/23/2020 07/03/2020 04/24/2020  Falls in the past year? _0 0 1  Number falls in past yr: _1 0 0  Injury with Fall? _2 0 0  Risk for fall due to : History of fall(s) - History of fall(s) - -  Follow up Falls prevention discussed - Falls evaluation completed - -    FALL RISK PREVENTION PERTAINING TO THE HOME:  Any stairs in or around the home? Yes  If so, are there any without handrails? Yes  - 2 steps outside Home free of loose throw rugs in walkways, pet beds, electrical cords, etc? Yes  Adequate lighting in your home to reduce risk of falls? Yes   ASSISTIVE DEVICES UTILIZED TO PREVENT FALLS:  Life alert? No  Use of a cane, walker or w/c? No  Grab bars in the bathroom? No  Shower chair or bench in shower? No  Elevated toilet seat or a handicapped toilet? No   TIMED UP AND GO:  Was the test performed? No . Telephonic   Cognitive Function: Normal cognitive status assessed by direct observation by this Nurse Health Advisor. No abnormalities found.          Immunizations Immunization History  Administered Date(s) Administered   Influenza Whole 02/09/2008, 01/31/2009, 01/28/2010   Influenza,inj,Quad PF,6+ Mos 01/05/2014, 02/12/2015, 01/21/2016, 01/12/2020   Influenza,inj,quad, With Preservative 12/12/2017   PFIZER(Purple Top)SARS-COV-2 Vaccination 08/03/2019, 08/24/2019   Pneumococcal Polysaccharide-23 07/03/2020   Td 04/22/2004   Tdap 04/22/2004, 04/22/2004, 07/04/2015    TDAP status: Up to date  Flu Vaccine status: Due, Education has been provided regarding the importance of this vaccine. Advised may receive this vaccine at local pharmacy or Health Dept. Aware to provide a  copy of the vaccination record if obtained from local pharmacy or Health Dept. Verbalized acceptance and understanding.  Pneumococcal vaccine status: Up to date  Covid-19 vaccine status: Completed vaccines  Qualifies for Shingles Vaccine? No  due age 89 if eligible; pt states she does not recall having chicken pox and interested in blood test for titer  Screening Tests Health Maintenance  Topic Date Due   OPHTHALMOLOGY EXAM  Never done   MAMMOGRAM  Never done   PAP SMEAR-Modifier  01/03/2020   COVID-19 Vaccine (3 - Booster for Pfizer series) 01/24/2020   INFLUENZA VACCINE  11/11/2020   HEMOGLOBIN A1C  04/25/2021   FOOT EXAM  07/03/2021   URINE MICROALBUMIN  10/23/2021   TETANUS/TDAP  07/03/2025   Hepatitis C Screening  Completed   HIV Screening  Completed   HPV VACCINES  Aged Out    Health Maintenance  Health Maintenance Due  Topic Date Due   OPHTHALMOLOGY EXAM  Never done   MAMMOGRAM  Never done   PAP SMEAR-Modifier  01/03/2020   COVID-19 Vaccine (3 - Booster for Pfizer series) 01/24/2020   INFLUENZA VACCINE  11/11/2020    Colorectal Cancer screening: colonoscopy due age 14  Mammogram status: Completed 2019. Repeat every year. Pt to contact [place] to schedule  Bone density status: due age 16 or prior due to family hx of osteoporosis  Lung Cancer Screening: (Low Dose CT Chest recommended if Age 79-80 years, 30 pack-year currently smoking OR have quit w/in 15years.) does not qualify.   Additional Screening:  Hepatitis C Screening: does qualify; Completed 06/21/2007  Vision Screening:  Recommended annual ophthalmology exams for early detection of glaucoma and other disorders of the eye. Is the patient up to date with their annual eye exam?  Yes  Who is the provider or what is the name of the office in which the patient attends annual eye exams? Scheduled with [facility]   Dental Screening: Recommended annual dental exams for proper oral  hygiene  Community Resource Referral / Chronic Care Management: CRR required this visit?  No   CCM required this visit?  No      Plan:     I have personally reviewed and noted the following in the patient's chart:   Medical and social history Use of alcohol, tobacco or illicit drugs  Current medications and supplements including opioid prescriptions.  Functional ability and status Nutritional status Physical activity Advanced directives List of other physicians Hospitalizations, surgeries, and ER visits in previous 12 months Vitals Screenings to include cognitive, depression, and falls Referrals and appointments  In addition, I have reviewed and discussed with patient certain preventive protocols, quality metrics, and best practice recommendations. A written personalized care plan for preventive services as well as general preventive health recommendations were provided to patient.     Clemetine Marker, LPN   28/11/3372   Nurse Notes: pt advised to follow up with referrals to [facility] and [facility]

## 2021-01-14 NOTE — Patient Instructions (Signed)
Donna Haas , Thank you for taking time to come for your Medicare Wellness Visit. I appreciate your ongoing commitment to your health goals. Please review the following plan we discussed and let me know if I can assist you in the future.   Screening recommendations/referrals: Colonoscopy: due age 45 Mammogram: done 2019; please call [facility] to schedule your mammogram Bone Density: due age 68 Recommended yearly ophthalmology/optometry visit for glaucoma screening and checkup Recommended yearly dental visit for hygiene and checkup  Vaccinations: Influenza vaccine: due Pneumococcal vaccine: done 07/03/20 Tdap vaccine: done 08/03/19 Shingles vaccine: due age 10 if eligible  Covid-19: done 08/03/19 & 08/24/19  Advanced directives: Advance directive discussed with you today. I have provided a copy for you to complete at home and have notarized. Once this is complete please bring a copy in to our office so we can scan it into your chart.   Conditions/risks identified: Recommend healthy eating and physical activity for desired weight loss   Next appointment: Follow up in one year for your annual wellness visit.   Preventive Care 40-64 Years, Female Preventive care refers to lifestyle choices and visits with your health care provider that can promote health and wellness. What does preventive care include? A yearly physical exam. This is also called an annual well check. Dental exams once or twice a year. Routine eye exams. Ask your health care provider how often you should have your eyes checked. Personal lifestyle choices, including: Daily care of your teeth and gums. Regular physical activity. Eating a healthy diet. Avoiding tobacco and drug use. Limiting alcohol use. Practicing safe sex. Taking low-dose aspirin daily starting at age 56. Taking vitamin and mineral supplements as recommended by your health care provider. What happens during an annual well check? The services  and screenings done by your health care provider during your annual well check will depend on your age, overall health, lifestyle risk factors, and family history of disease. Counseling  Your health care provider may ask you questions about your: Alcohol use. Tobacco use. Drug use. Emotional well-being. Home and relationship well-being. Sexual activity. Eating habits. Work and work Statistician. Method of birth control. Menstrual cycle. Pregnancy history. Screening  You may have the following tests or measurements: Height, weight, and BMI. Blood pressure. Lipid and cholesterol levels. These may be checked every 5 years, or more frequently if you are over 39 years old. Skin check. Lung cancer screening. You may have this screening every year starting at age 15 if you have a 30-pack-year history of smoking and currently smoke or have quit within the past 15 years. Fecal occult blood test (FOBT) of the stool. You may have this test every year starting at age 47. Flexible sigmoidoscopy or colonoscopy. You may have a sigmoidoscopy every 5 years or a colonoscopy every 10 years starting at age 38. Hepatitis C blood test. Hepatitis B blood test. Sexually transmitted disease (STD) testing. Diabetes screening. This is done by checking your blood sugar (glucose) after you have not eaten for a while (fasting). You may have this done every 1-3 years. Mammogram. This may be done every 1-2 years. Talk to your health care provider about when you should start having regular mammograms. This may depend on whether you have a family history of breast cancer. BRCA-related cancer screening. This may be done if you have a family history of breast, ovarian, tubal, or peritoneal cancers. Pelvic exam and Pap test. This may be done every 3 years starting at age 33.  Starting at age 11, this may be done every 5 years if you have a Pap test in combination with an HPV test. Bone density scan. This is done to screen  for osteoporosis. You may have this scan if you are at high risk for osteoporosis. Discuss your test results, treatment options, and if necessary, the need for more tests with your health care provider. Vaccines  Your health care provider may recommend certain vaccines, such as: Influenza vaccine. This is recommended every year. Tetanus, diphtheria, and acellular pertussis (Tdap, Td) vaccine. You may need a Td booster every 10 years. Zoster vaccine. You may need this after age 71. Pneumococcal 13-valent conjugate (PCV13) vaccine. You may need this if you have certain conditions and were not previously vaccinated. Pneumococcal polysaccharide (PPSV23) vaccine. You may need one or two doses if you smoke cigarettes or if you have certain conditions. Talk to your health care provider about which screenings and vaccines you need and how often you need them. This information is not intended to replace advice given to you by your health care provider. Make sure you discuss any questions you have with your health care provider. Document Released: 04/26/2015 Document Revised: 12/18/2015 Document Reviewed: 01/29/2015 Elsevier Interactive Patient Education  2017 [place] Prevention in the Home Falls can cause injuries. They can happen to people of all ages. There are many things you can do to make your home safe and to help prevent falls. What can I do on the outside of my home? Regularly fix the edges of walkways and driveways and fix any cracks. Remove anything that might make you trip as you walk through a door, such as a raised step or threshold. Trim any bushes or trees on the path to your home. Use bright outdoor lighting. Clear any walking paths of anything that might make someone trip, such as rocks or tools. Regularly check to see if handrails are loose or broken. Make sure that both sides of any steps have handrails. Any raised decks and porches should have guardrails on the  edges. Have any leaves, snow, or ice cleared regularly. Use sand or salt on walking paths during winter. Clean up any spills in your garage right away. This includes oil or grease spills. What can I do in the bathroom? Use night lights. Install grab bars by the toilet and in the tub and shower. Do not use towel bars as grab bars. Use non-skid mats or decals in the tub or shower. If you need to sit down in the shower, use a plastic, non-slip stool. Keep the floor dry. Clean up any water that spills on the floor as soon as it happens. Remove soap buildup in the tub or shower regularly. Attach bath mats securely with double-sided non-slip rug tape. Do not have throw rugs and other things on the floor that can make you trip. What can I do in the bedroom? Use night lights. Make sure that you have a light by your bed that is easy to reach. Do not use any sheets or blankets that are too big for your bed. They should not hang down onto the floor. Have a firm chair that has side arms. You can use this for support while you get dressed. Do not have throw rugs and other things on the floor that can make you trip. What can I do in the kitchen? Clean up any spills right away. Avoid walking on wet floors. Keep items that you use  a lot in easy-to-reach places. If you need to reach something above you, use a strong step stool that has a grab bar. Keep electrical cords out of the way. Do not use floor polish or wax that makes floors slippery. If you must use wax, use non-skid floor wax. Do not have throw rugs and other things on the floor that can make you trip. What can I do with my stairs? Do not leave any items on the stairs. Make sure that there are handrails on both sides of the stairs and use them. Fix handrails that are broken or loose. Make sure that handrails are as long as the stairways. Check any carpeting to make sure that it is firmly attached to the stairs. Fix any carpet that is loose or  worn. Avoid having throw rugs at the top or bottom of the stairs. If you do have throw rugs, attach them to the floor with carpet tape. Make sure that you have a light switch at the top of the stairs and the bottom of the stairs. If you do not have them, ask someone to add them for you. What else can I do to help prevent falls? Wear shoes that: Do not have high heels. Have rubber bottoms. Are comfortable and fit you well. Are closed at the toe. Do not wear sandals. If you use a stepladder: Make sure that it is fully opened. Do not climb a closed stepladder. Make sure that both sides of the stepladder are locked into place. Ask someone to hold it for you, if possible. Clearly mark and make sure that you can see: Any grab bars or handrails. First and last steps. Where the edge of each step is. Use tools that help you move around (mobility aids) if they are needed. These include: Canes. Walkers. Scooters. Crutches. Turn on the lights when you go into a dark area. Replace any light bulbs as soon as they burn out. Set up your furniture so you have a clear path. Avoid moving your furniture around. If any of your floors are uneven, fix them. If there are any pets around you, be aware of where they are. Review your medicines with your doctor. Some medicines can make you feel dizzy. This can increase your chance of falling. Ask your doctor what other things that you can do to help prevent falls. This information is not intended to replace advice given to you by your health care provider. Make sure you discuss any questions you have with your health care provider. Document Released: 01/24/2009 Document Revised: 09/05/2015 Document Reviewed: 05/04/2014 Elsevier Interactive Patient Education  2017 [employer].

## 2021-01-28 ENCOUNTER — Telehealth (INDEPENDENT_AMBULATORY_CARE_PROVIDER_SITE_OTHER): Payer: Medicare Other | Admitting: Nurse Practitioner

## 2021-01-28 DIAGNOSIS — T3695XA Adverse effect of unspecified systemic antibiotic, initial encounter: Secondary | ICD-10-CM | POA: Diagnosis not present

## 2021-01-28 DIAGNOSIS — R052 Subacute cough: Secondary | ICD-10-CM | POA: Diagnosis not present

## 2021-01-28 DIAGNOSIS — H9201 Otalgia, right ear: Secondary | ICD-10-CM | POA: Diagnosis not present

## 2021-01-28 DIAGNOSIS — R591 Generalized enlarged lymph nodes: Secondary | ICD-10-CM | POA: Diagnosis not present

## 2021-01-28 DIAGNOSIS — B379 Candidiasis, unspecified: Secondary | ICD-10-CM

## 2021-01-28 MED ORDER — FLUCONAZOLE 150 MG PO TABS: 150.0000 mg | ORAL_TABLET | Freq: Once | ORAL | 1 refills | Status: AC

## 2021-01-28 MED ORDER — AMOXICILLIN-POT CLAVULANATE 875-125 MG PO TABS: 1.0000 | ORAL_TABLET | Freq: Two times a day (BID) | ORAL | 0 refills | Status: AC

## 2021-01-28 NOTE — Progress Notes (Signed)
Name: Donna Haas   MRN: [id number]    DOB: Jul 05, 1975   Date:01/28/2021       Progress Note  Subjective  Chief Complaint  Chief Complaint  Patient presents with   Cough    Pt states when she swallows it hurts. Cough is constant   Ear Pain    Right ear   I connected with  [name]  on 01/28/21 at  1:00 PM EDT by a video enabled telemedicine application and verified that I am speaking with the correct person using two identifiers.  I discussed the limitations of evaluation and management by telemedicine and the availability of in person appointments. The patient expressed understanding and agreed to proceed with a virtual visit  Staff also discussed with the patient that there may be a patient responsible charge related to this service. Patient Location: home Provider Location: [facility] Additional Individuals present: alone  HPI  Swollen Lymph node:  She says she tested positive for RSV three weeks ago. She says she now has a swollen lymph node on the right side of her neck for the last few days.  She says it is painful to touch and to swallow. She denies any sore throat or nasal congestion.  She does endorse a cough but reports that is getting better.  She denies any fever. She says she has had this before. Will start on Augmentin and will have her follow-up in office if lymph node continues to be swollen.  Right ear pain: She reports right ear pain for a few days.  Discussed that it is difficult to assess an ear over a virtual appointment.  She denies loss of hearing. She says that is tender near her jaw.  She is going to start Augmentin and if pain does not improve she will come and be seen in office for further evaluation.   Cough:  She says her cough is much improved and does not need anything for the cough.  She says it is a dry cough that happens every once in awhile.  Patient Active Problem List   Diagnosis Date Noted   Migraine without aura and responsive to treatment  11/20/2020   Cervical nerve root disorder 11/20/2020   Female infertility due to ovulation failure 11/20/2020   B12 deficiency 11/20/2020   Obesity (BMI 30-39.9)    Diabetes mellitus with hyperglycemia ([place]) 06/14/2020   Anxiety, generalized 12/13/2019   Binge eating disorder 08/23/2019   Mild intermittent asthma without complication 40/98/1191   Hypertriglyceridemia 02/07/2016   Morbid obesity ([place]) 01/25/2016   Barrett's esophagus determined by biopsy 01/21/2016   Carpal tunnel syndrome, bilateral 01/21/2016   Hx gestational diabetes 01/21/2016   Neck pain 09/08/2012   INSOMNIA 09/21/2008   NEPHROLITHIASIS, HX OF 04/17/2008   THYROID NODULE 01/12/2008   GERD 05/30/2007   ENDOMETRIOSIS OF OTHER SPECIFIED SITES 05/30/2007   Lupus ([place]) 05/30/2007   HERNIATED LUMBAR DISC 05/30/2007   BACK PAIN 05/30/2007    Social History   Tobacco Use   Smoking status: Never   Smokeless tobacco: Never  Substance Use Topics   Alcohol use: No    Alcohol/week: 1.0 - 2.0 standard drink    Types: 1 - 2 Standard drinks or equivalent per week    Comment: discontinued use during pregnancy     Current Outpatient Medications:    ACCU-CHEK GUIDE test strip, USE UP TO 4 TIMES DAILY AS DIRECTED, Disp: , Rfl:    Accu-Chek Softclix Lancets lancets, SMARTSIG:Topical 1-4  Times Daily, Disp: , Rfl:    ALPRAZolam (XANAX) 1 MG tablet, Take 1 mg by mouth daily as needed., Disp: , Rfl:    BD PEN NEEDLE NANO 2ND GEN 32G X 4 MM MISC, 1 DOSE BY DOES NOT APPLY ROUTE 3 (THREE) TIMES DAILY BEFORE MEALS., Disp: , Rfl:    blood glucose meter kit and supplies, Dispense based on patient and insurance preference. Use up to four times daily as directed. (FOR ICD-10 E10.9, E11.9)., Disp: 1 each, Rfl: 0   clonazePAM (KLONOPIN) 1 MG tablet, Take 1 mg by mouth 3 (three) times daily as needed for anxiety., Disp: , Rfl:    dextroamphetamine (DEXTROSTAT) 10 MG tablet, Take 10 mg by mouth daily., Disp: , Rfl:    escitalopram  (LEXAPRO) 20 MG tablet, Take 10 tablets by mouth daily., Disp: , Rfl:    esomeprazole (NEXIUM) 20 MG capsule, Take 20 mg by mouth daily. , Disp: , Rfl:    ferrous sulfate 325 (65 FE) MG tablet, Take 325 mg by mouth daily., Disp: , Rfl:    LANTUS SOLOSTAR 100 UNIT/ML Solostar Pen, INJECT 36 UNITS INTO THE SKIN DAILY., Disp: 15 mL, Rfl: 0   loratadine (CLARITIN) 10 MG tablet, Take 10 mg by mouth daily., Disp: , Rfl:    Magnesium 250 MG TABS, Take 1 tablet by mouth daily., Disp: , Rfl:    metFORMIN (GLUCOPHAGE XR) 750 MG 24 hr tablet, Take 2 tablets (1,500 mg total) by mouth daily with breakfast., Disp: 180 tablet, Rfl: 0   Semaglutide,0.25 or 0.5MG/DOS, (OZEMPIC, 0.25 OR 0.5 MG/DOSE,) 2 MG/1.5ML SOPN, Inject 0.5 mg into the skin once a week. Use weekly.  For first 2 weeks use an initial dose of .25 mg and then increase to 1/2 mg. Decrease insulin to 25 units in the evening., Disp: 1.5 mL, Rfl: 12   zolpidem (AMBIEN) 10 MG tablet, Take 10 mg by mouth at bedtime., Disp: , Rfl:   Allergies  Allergen Reactions   Compazine [Prochlorperazine] Anaphylaxis   Prednisone Anaphylaxis   Prochlorperazine Edisylate Anaphylaxis   Etodolac     Other reaction(s): Unknown   Farxiga [Dapagliflozin]     Constipation and headaches   Hydrocodone-Acetaminophen Nausea Only   Morphine And Related Nausea Only and Other (See Comments)    Mood swings, headache    I personally reviewed active problem list, medication list, allergies with the patient/caregiver today.  ROS  Constitutional: Negative for fever or weight change.  HEENT: positive for right ear pain, positive swollen lymph node on right side of neck near jaw, negative for sore throat Respiratory: Positive for cough, negative for shortness of breath.   Cardiovascular: Negative for chest pain or palpitations.  Gastrointestinal: Negative for abdominal pain, no bowel changes.  Musculoskeletal: Negative for gait problem or joint swelling.  Skin: Negative for  rash.  Neurological: Negative for dizziness or headache.  No other specific complaints in a complete review of systems (except as listed in HPI above).   Objective  Virtual encounter, vitals not obtained.  There is no height or weight on file to calculate BMI.  Nursing Note and Vital Signs reviewed.  Physical Exam  Awake alert and oriented, speaking in complete sentences.  No results found for this or any previous visit (from the past 72 hour(s)).  Assessment & Plan  1. Lymphadenopathy -take antibiotic, if no improvement seek in office appointment for further evaluation - amoxicillin-clavulanate (AUGMENTIN) 875-125 MG tablet; Take 1 tablet by mouth 2 (two) times daily  for 7 days.  Dispense: 14 tablet; Refill: 0  2. Right ear pain -take antibiotic, if no improvement seek in office appointment for further evaluation - amoxicillin-clavulanate (AUGMENTIN) 875-125 MG tablet; Take 1 tablet by mouth 2 (two) times daily for 7 days.  Dispense: 14 tablet; Refill: 0  3. Subacute cough - monitor for worsening  4. Antibiotic-induced yeast infection  - fluconazole (DIFLUCAN) 150 MG tablet; Take 1 tablet (150 mg total) by mouth once for 1 dose.  Dispense: 1 tablet; Refill: 1  -Red flags and when to present for emergency care or RTC including fever >101.67F, chest pain, shortness of breath, new/worsening/un-resolving symptoms, reviewed with patient at time of visit. Follow up and care instructions discussed and provided in AVS. - I discussed the assessment and treatment plan with the patient. The patient was provided an opportunity to ask questions and all were answered. The patient agreed with the plan and demonstrated an understanding of the instructions.   I provided 25 minutes of non-face-to-face time during this encounter.  Bo Merino, FNP

## 2021-02-16 ENCOUNTER — Other Ambulatory Visit: Payer: Self-pay | Admitting: Family Medicine

## 2021-02-16 NOTE — Telephone Encounter (Signed)
dc'd 12/11/20 Change in therapy Dr Ancil Boozer

## 2021-02-16 NOTE — Telephone Encounter (Signed)
Requested Prescriptions  Pending Prescriptions Disp Refills  . XIGDUO XR 01-999 MG TB24 [Pharmacy Med Name: XIGDUO XR 10 MG-1,000 MG TAB] 90 tablet     Sig: TAKE 1 TABLET BY MOUTH EVERY DAY     Endocrinology:  Diabetes - Biguanide + SGLT2 Inhibitor Combos Failed - 02/16/2021  1:43 AM      Failed - LDL in normal range and within 360 days    LDL Cholesterol (Calc)  Date Value Ref Range Status  10/23/2020 114 (H) mg/dL (calc) Final    Comment:    Reference range: <100 . Desirable range <100 mg/dL for primary prevention;   <70 mg/dL for patients with CHD or diabetic patients  with > or = 2 CHD risk factors. Marland Kitchen LDL-C is now calculated using the Martin-Hopkins  calculation, which is a validated novel method providing  better accuracy than the Friedewald equation in the  estimation of LDL-C.  Cresenciano Genre et al. Annamaria Helling. 3267;124(58): 2061-2068  (http://education.[employer].com/faq/FAQ164)    Direct LDL  Date Value Ref Range Status  05/30/2007 132.4 mg/dL Final    Comment:    See lab report for associated comment(s)         Passed - Cr in normal range and within 360 days    Creat  Date Value Ref Range Status  10/23/2020 0.74 0.50 - 0.99 mg/dL Final         Passed - HBA1C is between 0 and 7.9 and within 180 days    Hemoglobin A1C  Date Value Ref Range Status  10/23/2020 6.6 (A) 4.0 - 5.6 % Final   Hgb A1c MFr Bld  Date Value Ref Range Status  06/14/2020 12.0 (H) 4.8 - 5.6 % Final    Comment:    (NOTE) Pre diabetes:          5.7%-6.4%  Diabetes:              >6.4%  Glycemic control for   <7.0% adults with diabetes          Passed - eGFR in normal range and within 360 days    GFR, Est African American  Date Value Ref Range Status  01/21/2016 >89 >=60 mL/min Final   GFR calc Af Amer  Date Value Ref Range Status  03/22/2017 >60 >60 mL/min Final    Comment:    (NOTE) The eGFR has been calculated using the CKD EPI equation. This calculation has not been validated  in all clinical situations. eGFR's persistently <60 mL/min signify possible Chronic Kidney Disease.    GFR, Est Non African American  Date Value Ref Range Status  01/21/2016 >89 >=60 mL/min Final   GFR, Estimated  Date Value Ref Range Status  06/17/2020 >60 >60 mL/min Final    Comment:    (NOTE) Calculated using the CKD-EPI Creatinine Equation (2021) Performed at [facility], [address] [place], [place] [address]    GFR  Date Value Ref Range Status  09/02/2011 80.38 >60.00 mL/min Final   eGFR  Date Value Ref Range Status  10/23/2020 104 > OR = 60 mL/min/1.23m Final    Comment:    The eGFR is based on the CKD-EPI 2021 equation. To calculate  the new eGFR from a previous Creatinine or Cystatin C result, go to https://www.kidney.org/professionals/ kdoqi/gfr%5Fcalculator          Passed - Valid encounter within last 6 months    Recent Outpatient Visits          2 weeks ago  Lymphadenopathy   [facility] Serafina Royals F, FNP   2 months ago Diabetes mellitus type 2 in obese [facility])   [facility] Steele Sizer, MD   3 months ago Type 2 diabetes mellitus with other specified complication, without long-term current use of insulin [facility])   [place], NP   7 months ago Need for pneumococcal vaccination   [place], [place]   9 months ago Upper respiratory tract infection, unspecified type   [place], MD      Future Appointments            In 1 week Steele Sizer, MD [facility], [place]           . LANTUS SOLOSTAR 100 UNIT/ML Solostar Pen [Pharmacy Med Name: LANTUS SOLOSTAR 100 UNIT/ML] 15 mL 0    Sig: INJECT 36 UNITS INTO THE SKIN DAILY.     Endocrinology:  Diabetes - Insulins Passed - 02/16/2021  1:43 AM      Passed - HBA1C is between 0 and 7.9 and within 180 days     Hemoglobin A1C  Date Value Ref Range Status  10/23/2020 6.6 (A) 4.0 - 5.6 % Final   Hgb A1c MFr Bld  Date Value Ref Range Status  06/14/2020 12.0 (H) 4.8 - 5.6 % Final    Comment:    (NOTE) Pre diabetes:          5.7%-6.4%  Diabetes:              >6.4%  Glycemic control for   <7.0% adults with diabetes          Passed - Valid encounter within last 6 months    Recent Outpatient Visits          2 weeks ago Lymphadenopathy   [facility] Serafina Royals F, FNP   2 months ago Diabetes mellitus type 2 in obese [facility])   [facility] Steele Sizer, MD   3 months ago Type 2 diabetes mellitus with other specified complication, without long-term current use of insulin [facility])   [place], NP   7 months ago Need for pneumococcal vaccination   [place], [place]   9 months ago Upper respiratory tract infection, unspecified type   [place], MD      Future Appointments            In 1 week Steele Sizer, MD [facility], [facility]

## 2021-02-17 ENCOUNTER — Other Ambulatory Visit: Payer: Self-pay | Admitting: Family Medicine

## 2021-02-17 NOTE — Telephone Encounter (Signed)
Requested medications are due for refill today.  yes  Requested medications are on the active medications list.  yes  Last refill. 10/23/2020  Future visit scheduled.   yes  Notes to clinic.  Please clarify instructions for this medication.

## 2021-02-17 NOTE — Telephone Encounter (Signed)
Medication Refill - Medication: Ozempic  Has the patient contacted their pharmacy? Yes.   Pt states that the pharmacy sent over a request last week and has not heard back. Please advise.  (Agent: If no, request that the patient contact the pharmacy for the refill. If patient does not wish to contact the pharmacy document the reason why and proceed with request.) (Agent: If yes, when and what did the pharmacy advise?)  Preferred Pharmacy (with phone number or street name):  [id number] - [place], [place] - [address]  [address] [place] [address]  Phone: [phone] Fax: [phone]  Hours: Not open 24 hours   Has the patient been seen for an appointment in the last year OR does the patient have an upcoming appointment? Yes.   02/24/21  Agent: Please be advised that RX refills may take up to 3 business days. We ask that you follow-up with your pharmacy.

## 2021-02-18 MED ORDER — OZEMPIC (0.25 OR 0.5 MG/DOSE) 2 MG/1.5ML ~~LOC~~ SOPN: 0.5000 mg | PEN_INJECTOR | SUBCUTANEOUS | 0 refills | Status: DC

## 2021-02-21 NOTE — Progress Notes (Deleted)
Name: Donna Haas   MRN: [id number]    DOB: 1975/11/10   Date:02/21/2021       Progress Note  Subjective  Chief Complaint  Follow Up  HPI  DMII: she was seen by Lona Millard NP in June, her A1C was at goal  At 6.6 % , she was started on Ozempic and advised to wean off Lantus. She was also given Free [employer] but insurance only pays for the [place] 2. Glucose at home has been between 110-150 and she is still using Lantus 23 units per day. She is on second week of Ozempic 0.5 mg, she has noticed that it curbs her appetite and lost 10 lbs since last visit. She felt dizzy once after he last dose of Ozempic otherwise not other side effects. She denies polyphagia, polydipsia or polyuria. Reviewed labs with patient. Also reviewed her chart and there was a history of pancreatitis - she states nobody every told her about it, it may have been secondary to choledocholithiasis. Explained increase risk of pancreatitis with GLP-1 agonists.  Morbid obesity: she states ozempic is helping curb her appetite and has lost weight   Lupus: diagnosed years ago, no rashes at this time, feels achy and has joint aches. She used to go to [place] but not in years, Dr. Sanda Klein treated her with Plaquenil but it caused vision disturbances. We will refer her back to rheumatologist   Thyroid nodule: used to see Dr. Beverly Milch and also lost to follow up, TSH within normal limits   Patient Active Problem List   Diagnosis Date Noted   Migraine without aura and responsive to treatment 11/20/2020   Cervical nerve root disorder 11/20/2020   Female infertility due to ovulation failure 11/20/2020   B12 deficiency 11/20/2020   Obesity (BMI 30-39.9)    Diabetes mellitus with hyperglycemia ([place]) 06/14/2020   Anxiety, generalized 12/13/2019   Binge eating disorder 08/23/2019   Mild intermittent asthma without complication 94/76/5465   Hypertriglyceridemia 02/07/2016   Morbid obesity ([place]) 01/25/2016   Barrett's esophagus  determined by biopsy 01/21/2016   Carpal tunnel syndrome, bilateral 01/21/2016   Hx gestational diabetes 01/21/2016   Neck pain 09/08/2012   INSOMNIA 09/21/2008   NEPHROLITHIASIS, HX OF 04/17/2008   THYROID NODULE 01/12/2008   GERD 05/30/2007   ENDOMETRIOSIS OF OTHER SPECIFIED SITES 05/30/2007   Lupus ([place]) 05/30/2007   HERNIATED LUMBAR [place] 05/30/2007   BACK PAIN 05/30/2007    Past Surgical History:  Procedure Laterality Date   APPENDECTOMY     CESAREAN SECTION     x2   CESAREAN SECTION WITH BILATERAL TUBAL LIGATION Bilateral 08/30/2015   Procedure: CESAREAN SECTION WITH BILATERAL TUBAL LIGATION;  Surgeon: Honor Loh Ward, MD;  Location: [facility] ORS;  Service: Obstetrics;  Laterality: Bilateral;   CHOLECYSTECTOMY     LAPAROSCOPY     x3   THYROID CYST EXCISION     Dr. Trena Platt, [place]    Family History  Problem Relation Age of Onset   Depression Mother        Bi-polar   Asthma Mother    Allergies Mother    Sleep apnea Mother    Hyperlipidemia Other    Hypertension Other    Heart disease Other    Allergies Sister    Diabetes Sister    Rheum arthritis Maternal Grandmother     Social History   Tobacco Use   Smoking status: Never   Smokeless tobacco: Never  Substance Use Topics   Alcohol use:  No    Alcohol/week: 1.0 - 2.0 standard drink    Types: 1 - 2 Standard drinks or equivalent per week    Comment: discontinued use during pregnancy     Current Outpatient Medications:    ACCU-CHEK GUIDE test strip, USE UP TO 4 TIMES DAILY AS DIRECTED, Disp: , Rfl:    Accu-Chek Softclix Lancets lancets, SMARTSIG:Topical 1-4 Times Daily, Disp: , Rfl:    ALPRAZolam (XANAX) 1 MG tablet, Take 1 mg by mouth daily as needed., Disp: , Rfl:    BD PEN NEEDLE NANO 2ND GEN 32G X 4 MM MISC, 1 DOSE BY DOES NOT APPLY ROUTE 3 (THREE) TIMES DAILY BEFORE MEALS., Disp: , Rfl:    blood glucose meter kit and supplies, Dispense based on patient and insurance preference. Use up to four times daily as  directed. (FOR ICD-10 E10.9, E11.9)., Disp: 1 each, Rfl: 0   clonazePAM (KLONOPIN) 1 MG tablet, Take 1 mg by mouth 3 (three) times daily as needed for anxiety., Disp: , Rfl:    dextroamphetamine (DEXTROSTAT) 10 MG tablet, Take 10 mg by mouth daily., Disp: , Rfl:    escitalopram (LEXAPRO) 20 MG tablet, Take 10 tablets by mouth daily., Disp: , Rfl:    esomeprazole (NEXIUM) 20 MG capsule, Take 20 mg by mouth daily. , Disp: , Rfl:    ferrous sulfate 325 (65 FE) MG tablet, Take 325 mg by mouth daily., Disp: , Rfl:    LANTUS SOLOSTAR 100 UNIT/ML Solostar Pen, INJECT 36 UNITS INTO THE SKIN DAILY., Disp: 15 mL, Rfl: 0   loratadine (CLARITIN) 10 MG tablet, Take 10 mg by mouth daily., Disp: , Rfl:    Magnesium 250 MG TABS, Take 1 tablet by mouth daily., Disp: , Rfl:    metFORMIN (GLUCOPHAGE XR) 750 MG 24 hr tablet, Take 2 tablets (1,500 mg total) by mouth daily with breakfast., Disp: 180 tablet, Rfl: 0   Semaglutide,0.25 or 0.5MG/DOS, (OZEMPIC, 0.25 OR 0.5 MG/DOSE,) 2 MG/1.5ML SOPN, Inject 0.5 mg into the skin once a week., Disp: 1.5 mL, Rfl: 0   zolpidem (AMBIEN) 10 MG tablet, Take 10 mg by mouth at bedtime., Disp: , Rfl:   Allergies  Allergen Reactions   Compazine [Prochlorperazine] Anaphylaxis   Prednisone Anaphylaxis   Prochlorperazine Edisylate Anaphylaxis   Etodolac     Other reaction(s): Unknown   Farxiga [Dapagliflozin]     Constipation and headaches   Hydrocodone-Acetaminophen Nausea Only   Morphine And Related Nausea Only and Other (See Comments)    Mood swings, headache    I personally reviewed active problem list, medication list, allergies, family history, social history, health maintenance with the patient/caregiver today.   ROS  ***  Objective  There were no vitals filed for this visit.  There is no height or weight on file to calculate BMI.  Physical Exam ***  No results found for this or any previous visit (from the past 2160 hour(s)).    PHQ2/9: Depression  screen [facility] 2/9 01/28/2021 01/14/2021 11/20/2020 10/23/2020 07/03/2020  Decreased Interest 0 0 0 0 0  Down, Depressed, Hopeless 0 0 1 0 0  PHQ - 2 Score 0 0 1 0 0  Altered sleeping 0 0 1 0 0  Tired, decreased energy 0 0 0 0 0  Change in appetite 0 0 0 0 0  Feeling bad or failure about yourself  0 0 0 0 0  Trouble concentrating 0 1 3 0 0  Moving slowly or fidgety/restless 0 0 0 0  0  Suicidal thoughts 0 0 0 0 0  PHQ-9 Score 0 1 5 0 0  Difficult doing work/chores Not difficult at all - - Not difficult at all Not difficult at all    phq 9 is {gen pos [id number]}   Fall Risk: Fall Risk  01/28/2021 01/14/2021 11/20/2020 10/23/2020 07/03/2020  Falls in the past year? 0 '1 1 1 ' 0  Number falls in past yr: 0 '1 1 1 ' 0  Injury with Fall? 0 '1 1 1 ' 0  Risk for fall due to : No Fall Risks History of fall(s) - History of fall(s) -  Follow up Falls prevention discussed Falls prevention discussed - Falls evaluation completed -      Functional Status Survey:      Assessment & Plan  *** There are no diagnoses linked to this encounter.

## 2021-02-24 ENCOUNTER — Ambulatory Visit: Payer: Medicare Other | Admitting: Family Medicine

## 2021-02-24 DIAGNOSIS — E1169 Type 2 diabetes mellitus with other specified complication: Secondary | ICD-10-CM

## 2021-03-08 ENCOUNTER — Telehealth: Payer: Self-pay | Admitting: Family Medicine

## 2021-03-10 ENCOUNTER — Other Ambulatory Visit: Payer: Self-pay | Admitting: Family Medicine

## 2021-03-17 DIAGNOSIS — M545 Low back pain, unspecified: Secondary | ICD-10-CM | POA: Diagnosis not present

## 2021-03-17 DIAGNOSIS — M5416 Radiculopathy, lumbar region: Secondary | ICD-10-CM | POA: Diagnosis not present

## 2021-03-24 NOTE — Progress Notes (Signed)
Name: Donna Haas   MRN: [id number]    DOB: 1975/04/20   Date:03/25/2021       Progress Note  Subjective  Chief Complaint  Follow Up  I connected with  Donna Haas  on 03/25/21 at  3:20 PM EST by a video enabled telemedicine application and verified that I am speaking with the correct person using two identifiers.  I discussed the limitations of evaluation and management by telemedicine and the availability of in person appointments. The patient expressed understanding and agreed to proceed with the virtual visit  Staff also discussed with the patient that there may be a patient responsible charge related to this service. Patient Location: at home  Provider Location: [facility] Additional Individuals present: alone   HPI  DMII: she was seen by Lona Millard NP in June, her A1C was at goal  At 6.6 % , she was started on Ozempic and advised to wean off Lantus, she is now down to 17 units but when she got sick in October with RSV her glucose started to spike and she went up on Lantus to 25 units.  She is tolerating Ozempic well at 0.5 mg weekly we will adjust dose to 1 mg weekly since no longer losing weight and unable to wean down Lantus. She is afraid to start SGL-2 agonist due to possible yeast infection. She denies polyphagia, polydipsia or polyuria.Explained increase risk of pancreatitis with GLP-1 agonists/she had one episode in the past likely due to gallstones and has been doing well so far on medication. She has tried statin therapy in the past but could not tolerate it : causes weakness/nausea and myopathy   Morbid obesity: she states ozempic is helping curb her appetite and had lost 10 lbs and is maintaining since   Lupus: diagnosed years ago, no rashes at this time, feels achy and has joint aches. She used to go to [place] but not in years, Dr. Sanda Klein treated her with Plaquenil but it caused vision disturbances. She has a follow up with Dr. Posey Pronto in January 2023   Thyroid nodule:  used to see Dr. Beverly Milch and also lost to follow up, TSH within normal limits , she will follow up with endo at [facility]   Patient Active Problem List   Diagnosis Date Noted   Migraine without aura and responsive to treatment 11/20/2020   Cervical nerve root disorder 11/20/2020   Female infertility due to ovulation failure 11/20/2020   B12 deficiency 11/20/2020   Obesity (BMI 30-39.9)    Diabetes mellitus with hyperglycemia ([place]) 06/14/2020   Anxiety, generalized 12/13/2019   Binge eating disorder 08/23/2019   Mild intermittent asthma without complication 17/51/0258   Hypertriglyceridemia 02/07/2016   Morbid obesity ([place]) 01/25/2016   Barrett's esophagus determined by biopsy 01/21/2016   Carpal tunnel syndrome, bilateral 01/21/2016   Hx gestational diabetes 01/21/2016   Neck pain 09/08/2012   INSOMNIA 09/21/2008   NEPHROLITHIASIS, HX OF 04/17/2008   THYROID NODULE 01/12/2008   GERD 05/30/2007   ENDOMETRIOSIS OF OTHER SPECIFIED SITES 05/30/2007   Lupus ([place]) 05/30/2007   HERNIATED LUMBAR [place] 05/30/2007   BACK PAIN 05/30/2007    Past Surgical History:  Procedure Laterality Date   APPENDECTOMY     CESAREAN SECTION     x2   CESAREAN SECTION WITH BILATERAL TUBAL LIGATION Bilateral 08/30/2015   Procedure: CESAREAN SECTION WITH BILATERAL TUBAL LIGATION;  Surgeon: Honor Loh Ward, MD;  Location: [facility] ORS;  Service: Obstetrics;  Laterality: Bilateral;  CHOLECYSTECTOMY     LAPAROSCOPY     x3   THYROID CYST EXCISION     Dr. Trena Platt, [place]    Family History  Problem Relation Age of Onset   Depression Mother        Bi-polar   Asthma Mother    Allergies Mother    Sleep apnea Mother    Hyperlipidemia Other    Hypertension Other    Heart disease Other    Allergies Sister    Diabetes Sister    Rheum arthritis Maternal Grandmother     Social History   Socioeconomic History   Marital status: Divorced    Spouse name: Not on file   Number of children: 2   Years of  education: Not on file   Highest education level: Associate degree: academic program  Occupational History   Occupation: disability    Employer: UNEMPLOYED  Tobacco Use   Smoking status: Never   Smokeless tobacco: Never  Vaping Use   Vaping Use: Never used  Substance and Sexual Activity   Alcohol use: No    Alcohol/week: 1.0 - 2.0 standard drink    Types: 1 - 2 Standard drinks or equivalent per week    Comment: discontinued use during pregnancy   Drug use: No   Sexual activity: Not Currently    Partners: Male  Other Topics Concern   Not on file  Social History Narrative   Not on file   Social Determinants of Health   Financial Resource Strain: Low Risk    Difficulty of Paying Living Expenses: Not hard at all  Food Insecurity: No Food Insecurity   Worried About Charity fundraiser in the Last Year: Never true   [employer] in the Last Year: Never true  Transportation Needs: No Transportation Needs   Lack of Transportation (Medical): No   Lack of Transportation (Non-Medical): No  Physical Activity: Sufficiently Active   Days of Exercise per Week: 5 days   Minutes of Exercise per Session: 30 min  Stress: No Stress Concern Present   Feeling of Stress : Not at all  Social Connections: Moderately Integrated   Frequency of Communication with Friends and Family: More than three times a week   Frequency of Social Gatherings with Friends and Family: More than three times a week   Attends Religious Services: More than 4 times per year   Active Member of [employer] or Organizations: Yes   Attends Music therapist: More than 4 times per year   Marital Status: Divorced  Human resources officer Violence: Not At Risk   Fear of Current or Ex-Partner: No   Emotionally Abused: No   Physically Abused: No   Sexually Abused: No     Current Outpatient Medications:    ACCU-CHEK GUIDE test strip, USE UP TO 4 TIMES DAILY AS DIRECTED, Disp: , Rfl:    Accu-Chek Softclix Lancets  lancets, SMARTSIG:Topical 1-4 Times Daily, Disp: , Rfl:    ALPRAZolam (XANAX) 1 MG tablet, Take 1 mg by mouth daily as needed., Disp: , Rfl:    BD PEN NEEDLE NANO 2ND GEN 32G X 4 MM MISC, 1 DOSE BY DOES NOT APPLY ROUTE 3 (THREE) TIMES DAILY BEFORE MEALS., Disp: , Rfl:    blood glucose meter kit and supplies, Dispense based on patient and insurance preference. Use up to four times daily as directed. (FOR ICD-10 E10.9, E11.9)., Disp: 1 each, Rfl: 0   clonazePAM (KLONOPIN) 1 MG tablet, Take 1 mg  by mouth 3 (three) times daily as needed for anxiety., Disp: , Rfl:    dextroamphetamine (DEXTROSTAT) 10 MG tablet, Take 10 mg by mouth daily., Disp: , Rfl:    escitalopram (LEXAPRO) 20 MG tablet, Take 10 tablets by mouth daily., Disp: , Rfl:    esomeprazole (NEXIUM) 20 MG capsule, Take 20 mg by mouth daily. , Disp: , Rfl:    ferrous sulfate 325 (65 FE) MG tablet, Take 325 mg by mouth daily., Disp: , Rfl:    LANTUS SOLOSTAR 100 UNIT/ML Solostar Pen, INJECT 36 UNITS INTO THE SKIN DAILY., Disp: 15 mL, Rfl: 0   loratadine (CLARITIN) 10 MG tablet, Take 10 mg by mouth daily., Disp: , Rfl:    Magnesium 250 MG TABS, Take 1 tablet by mouth daily., Disp: , Rfl:    metFORMIN (GLUCOPHAGE-XR) 750 MG 24 hr tablet, TAKE 2 TABLETS (1,500 MG TOTAL) BY MOUTH DAILY WITH BREAKFAST., Disp: 180 tablet, Rfl: 0   Semaglutide,0.25 or 0.5MG/DOS, (OZEMPIC, 0.25 OR 0.5 MG/DOSE,) 2 MG/1.5ML SOPN, Inject 0.5 mg into the skin once a week., Disp: 1.5 mL, Rfl: 0   zolpidem (AMBIEN) 10 MG tablet, Take 10 mg by mouth at bedtime., Disp: , Rfl:   Allergies  Allergen Reactions   Compazine [Prochlorperazine] Anaphylaxis   Prednisone Anaphylaxis   Prochlorperazine Edisylate Anaphylaxis   Etodolac     Other reaction(s): Unknown   Farxiga [Dapagliflozin]     Constipation and headaches   Hydrocodone-Acetaminophen Nausea Only   Morphine And Related Nausea Only and Other (See Comments)    Mood swings, headache    I personally reviewed active  problem list, medication list, allergies, family history, social history, health maintenance with the patient/caregiver today.   ROS  Ten systems reviewed and is negative except as mentioned in HPI   Objective  Virtual encounter, vitals not obtained.  There is no height or weight on file to calculate BMI.  Physical Exam  Awake , alert and oriented    PHQ2/9: Depression screen [facility] 2/9 03/25/2021 01/28/2021 01/14/2021 11/20/2020 10/23/2020  Decreased Interest 0 0 0 0 0  Down, Depressed, Hopeless 0 0 0 1 0  PHQ - 2 Score 0 0 0 1 0  Altered sleeping 0 0 0 1 0  Tired, decreased energy 0 0 0 0 0  Change in appetite 0 0 0 0 0  Feeling bad or failure about yourself  0 0 0 0 0  Trouble concentrating 0 0 1 3 0  Moving slowly or fidgety/restless 0 0 0 0 0  Suicidal thoughts 0 0 0 0 0  PHQ-9 Score 0 0 1 5 0  Difficult doing work/chores - Not difficult at all - - Not difficult at all   PHQ-2/9 Result is negative.    Fall Risk: Fall Risk  03/25/2021 01/28/2021 01/14/2021 11/20/2020 10/23/2020  Falls in the past year? 0 0 '1 1 1  ' Number falls in past yr: 0 0 '1 1 1  ' Injury with Fall? 0 0 '1 1 1  ' Risk for fall due to : No Fall Risks No Fall Risks History of fall(s) - History of fall(s)  Follow up Falls prevention discussed Falls prevention discussed Falls prevention discussed - Falls evaluation completed     Assessment & Plan  1. Dyslipidemia associated with type 2 diabetes mellitus ([place])  Return this week for A1C  2. Morbid obesity ([place])  Discussed with the patient the risk posed by an increased BMI. Discussed importance of portion control, calorie counting and  at least 150 minutes of physical activity weekly. Avoid sweet beverages and drink more water. Eat at least 6 servings of fruit and vegetables daily    3. Right thyroid nodule  She states Rheumatologist advised to see someone inside [place] clinic  4. Systemic lupus erythematosus, unspecified SLE type, unspecified organ  involvement status ([place])  Keep follow up with Rheumatologist in January   5. Statin myopathy   She does not want to try Zetia at this time   I discussed the assessment and treatment plan with the patient. The patient was provided an opportunity to ask questions and all were answered. The patient agreed with the plan and demonstrated an understanding of the instructions.  The patient was advised to call back or seek an in-person evaluation if the symptoms worsen or if the condition fails to improve as anticipated.  I provided 25 minutes of non-face-to-face time during this encounter.

## 2021-03-25 ENCOUNTER — Telehealth: Payer: Self-pay

## 2021-03-25 ENCOUNTER — Telehealth (INDEPENDENT_AMBULATORY_CARE_PROVIDER_SITE_OTHER): Payer: Medicare Other | Admitting: Family Medicine

## 2021-03-25 ENCOUNTER — Encounter: Payer: Self-pay | Admitting: Family Medicine

## 2021-03-25 DIAGNOSIS — E1169 Type 2 diabetes mellitus with other specified complication: Secondary | ICD-10-CM | POA: Diagnosis not present

## 2021-03-25 DIAGNOSIS — G720 Drug-induced myopathy: Secondary | ICD-10-CM | POA: Diagnosis not present

## 2021-03-25 DIAGNOSIS — E785 Hyperlipidemia, unspecified: Secondary | ICD-10-CM | POA: Diagnosis not present

## 2021-03-25 DIAGNOSIS — Z538 Procedure and treatment not carried out for other reasons: Secondary | ICD-10-CM

## 2021-03-25 DIAGNOSIS — E041 Nontoxic single thyroid nodule: Secondary | ICD-10-CM | POA: Diagnosis not present

## 2021-03-25 DIAGNOSIS — T466X5A Adverse effect of antihyperlipidemic and antiarteriosclerotic drugs, initial encounter: Secondary | ICD-10-CM

## 2021-03-25 DIAGNOSIS — M329 Systemic lupus erythematosus, unspecified: Secondary | ICD-10-CM | POA: Diagnosis not present

## 2021-03-25 MED ORDER — SEMAGLUTIDE (1 MG/DOSE) 4 MG/3ML ~~LOC~~ SOPN: 1.0000 mg | PEN_INJECTOR | SUBCUTANEOUS | 0 refills | Status: DC

## 2021-03-25 MED ORDER — LANTUS SOLOSTAR 100 UNIT/ML ~~LOC~~ SOPN: 25.0000 [IU] | PEN_INJECTOR | Freq: Every day | SUBCUTANEOUS | 0 refills | Status: DC

## 2021-03-25 MED ORDER — METFORMIN HCL ER 750 MG PO TB24: 1500.0000 mg | ORAL_TABLET | Freq: Every day | ORAL | 0 refills | Status: DC

## 2021-03-25 NOTE — Telephone Encounter (Signed)
Copied from [place] [id number]. Topic: General - Other >> Mar 25, 2021  2:33 PM Yvette Rack wrote: Reason for CRM: Sharyn Lull a pharmacist with [facility] reports that their records show pt has diabetes but does not take a statin. Per guidelines patients 28-43 years old with diabetes should take a statin to decrease cardiovascular risk. Cb# [phone]

## 2021-03-25 NOTE — Telephone Encounter (Signed)
Pharmacy updated.

## 2021-03-27 ENCOUNTER — Other Ambulatory Visit: Payer: Self-pay | Admitting: Family Medicine

## 2021-03-27 ENCOUNTER — Ambulatory Visit: Payer: Medicare Other

## 2021-04-09 DIAGNOSIS — M5416 Radiculopathy, lumbar region: Secondary | ICD-10-CM | POA: Diagnosis not present

## 2021-05-26 ENCOUNTER — Other Ambulatory Visit: Payer: Self-pay | Admitting: Family Medicine

## 2021-06-06 ENCOUNTER — Telehealth: Payer: Medicare Other | Admitting: Physician Assistant

## 2021-06-06 ENCOUNTER — Ambulatory Visit: Payer: Self-pay | Admitting: *Deleted

## 2021-06-06 DIAGNOSIS — H66002 Acute suppurative otitis media without spontaneous rupture of ear drum, left ear: Secondary | ICD-10-CM | POA: Diagnosis not present

## 2021-06-06 MED ORDER — AMOXICILLIN-POT CLAVULANATE 875-125 MG PO TABS: 1.0000 | ORAL_TABLET | Freq: Two times a day (BID) | ORAL | 0 refills | Status: DC

## 2021-06-06 MED ORDER — NEOMYCIN-POLYMYXIN-HC 3.5-10000-1 OT SOLN: 3.0000 [drp] | Freq: Four times a day (QID) | OTIC | 0 refills | Status: DC

## 2021-06-06 MED ORDER — FLUCONAZOLE 150 MG PO TABS: 150.0000 mg | ORAL_TABLET | Freq: Once | ORAL | 0 refills | Status: AC

## 2021-06-06 NOTE — Patient Instructions (Signed)
Donna Haas, thank you for joining Donna Daring, PA-C for today's virtual visit.  While this provider is not your primary care provider (PCP), if your PCP is located in our provider database this encounter information will be shared with them immediately following your visit.  Consent: (Patient) Donna Haas provided verbal consent for this virtual visit at the beginning of the encounter.  Current Medications:  Current Outpatient Medications:    amoxicillin-clavulanate (AUGMENTIN) 875-125 MG tablet, Take 1 tablet by mouth 2 (two) times daily., Disp: 14 tablet, Rfl: 0   fluconazole (DIFLUCAN) 150 MG tablet, Take 1 tablet (150 mg total) by mouth once for 1 dose. May repeat in 72 hours if needed, Disp: 2 tablet, Rfl: 0   neomycin-polymyxin-hydrocortisone (CORTISPORIN) OTIC solution, Place 3 drops into the left ear 4 (four) times daily., Disp: 10 mL, Rfl: 0   ACCU-CHEK GUIDE test strip, USE UP TO 4 TIMES DAILY AS DIRECTED, Disp: , Rfl:    Accu-Chek Softclix Lancets lancets, SMARTSIG:Topical 1-4 Times Daily, Disp: , Rfl:    ALPRAZolam (XANAX) 1 MG tablet, Take 1 mg by mouth daily as needed., Disp: , Rfl:    BD PEN NEEDLE NANO 2ND GEN 32G X 4 MM MISC, 1 DOSE BY DOES NOT APPLY ROUTE 3 (THREE) TIMES DAILY BEFORE MEALS., Disp: , Rfl:    blood glucose meter kit and supplies, Dispense based on patient and insurance preference. Use up to four times daily as directed. (FOR ICD-10 E10.9, E11.9)., Disp: 1 each, Rfl: 0   clonazePAM (KLONOPIN) 1 MG tablet, Take 1 mg by mouth 3 (three) times daily as needed for anxiety., Disp: , Rfl:    Cyanocobalamin (B-12) 1000 MCG SUBL, Place under the tongue., Disp: , Rfl:    dextroamphetamine (DEXTROSTAT) 10 MG tablet, Take 10 mg by mouth daily., Disp: , Rfl:    escitalopram (LEXAPRO) 20 MG tablet, Take 10 tablets by mouth daily., Disp: , Rfl:    esomeprazole (NEXIUM) 20 MG capsule, Take 20 mg by mouth daily. , Disp: , Rfl:    ferrous sulfate 325 (65 FE) MG  tablet, Take 325 mg by mouth daily., Disp: , Rfl:    LANTUS SOLOSTAR 100 UNIT/ML Solostar Pen, INJECT 25-50 UNITS INTO THE SKIN DAILY., Disp: 15 mL, Rfl: 0   loratadine (CLARITIN) 10 MG tablet, Take 10 mg by mouth daily., Disp: , Rfl:    Magnesium 250 MG TABS, Take 1 tablet by mouth daily., Disp: , Rfl:    metFORMIN (GLUCOPHAGE-XR) 750 MG 24 hr tablet, Take 2 tablets (1,500 mg total) by mouth daily with breakfast., Disp: 180 tablet, Rfl: 0   Semaglutide, 1 MG/DOSE, 4 MG/3ML SOPN, Inject 1 mg as directed once a week., Disp: 9 mL, Rfl: 0   zolpidem (AMBIEN) 10 MG tablet, Take 10 mg by mouth at bedtime., Disp: , Rfl:    Medications ordered in this encounter:  Meds ordered this encounter  Medications   amoxicillin-clavulanate (AUGMENTIN) 875-125 MG tablet    Sig: Take 1 tablet by mouth 2 (two) times daily.    Dispense:  14 tablet    Refill:  0    Order Specific Question:   Supervising Provider    Answer:   MILLER, BRIAN [[id number]]   neomycin-polymyxin-hydrocortisone (CORTISPORIN) OTIC solution    Sig: Place 3 drops into the left ear 4 (four) times daily.    Dispense:  10 mL    Refill:  0    Order Specific Question:   Supervising Provider  Answer:   MILLER, BRIAN [[id number]]   fluconazole (DIFLUCAN) 150 MG tablet    Sig: Take 1 tablet (150 mg total) by mouth once for 1 dose. May repeat in 72 hours if needed    Dispense:  2 tablet    Refill:  0    Order Specific Question:   Supervising Provider    Answer:   Donna Haas, Donna Haas     *If you need refills on other medications prior to your next appointment, please contact your pharmacy*  Follow-Up: Call back or seek an in-person evaluation if the symptoms worsen or if the condition fails to improve as anticipated.  Other Instructions Otitis Media, Adult Otitis media occurs when there is inflammation and fluid in the middle ear with signs and symptoms of an acute infection. The middle ear is a part of the ear that contains bones for hearing as  well as air that helps send sounds to the brain. When infected fluid builds up in this space, it causes pressure and can lead to an ear infection. The eustachian tube connects the middle ear to the back of the nose (nasopharynx) and normally allows air into the middle ear. If the eustachian tube becomes blocked, fluid can build up and become infected. What are the causes? This condition is caused by a blockage in the eustachian tube. This can be caused by mucus or by swelling of the tube. Problems that can cause a blockage include: A cold or other upper respiratory infection. Allergies. An irritant, such as tobacco smoke. Enlarged adenoids. The adenoids are areas of soft tissue located high in the back of the throat, behind the nose and the roof of the mouth. They are part of the body's defense system (immune system). A mass in the nasopharynx. Damage to the ear caused by pressure changes (barotrauma). What increases the risk? You are more likely to develop this condition if you: Smoke or are exposed to tobacco smoke. Have an opening in the roof of your mouth (cleft palate). Have gastroesophageal reflux. Have an immune system disorder. What are the signs or symptoms? Symptoms of this condition include: Ear pain. Fever. Decreased hearing. Tiredness (lethargy). Fluid leaking from the ear, if the eardrum is ruptured or has burst. Ringing in the ear. How is this diagnosed? This condition is diagnosed with a physical exam. During the exam, your health care provider will use an instrument called an otoscope to look in your ear and check for redness, swelling, and fluid. He or she will also ask about your symptoms. Your health care provider may also order tests, such as: A pneumatic otoscopy. This is a test to check the movement of the eardrum. It is done by squeezing a small amount of air into the ear. A tympanogram. This is a test that shows how well the eardrum moves in response to air  pressure in the ear canal. It provides a graph for your health care provider to review. How is this treated? This condition can go away on its own within 3-5 days. But if the condition is caused by a bacterial infection and does not go away on its own, or if it keeps coming back, your health care provider may: Prescribe antibiotic medicine to treat the infection. Prescribe or recommend medicines to control pain. Follow these instructions at home: Take over-the-counter and prescription medicines only as told by your health care provider. If you were prescribed an antibiotic medicine, take it as told by your health care provider. Do  not stop taking the antibiotic even if you start to feel better. Keep all follow-up visits. This is important. Contact a health care provider if: You have bleeding from your nose. There is a lump on your neck. You are not feeling better in 5 days. You feel worse instead of better. Get help right away if: You have severe pain that is not controlled with medicine. You have swelling, redness, or pain around your ear. You have stiffness in your neck. A part of your face is not moving (paralyzed). The bone behind your ear (mastoid bone) is tender when you touch it. You develop a severe headache. Summary Otitis media is redness, soreness, and swelling of the middle ear, usually resulting in pain and decreased hearing. This condition can go away on its own within 3-5 days. If the problem does not go away in 3-5 days, your health care provider may give you medicines to treat the infection. If you were prescribed an antibiotic medicine, take it as told by your health care provider. Follow all instructions that were given to you by your health care provider. This information is not intended to replace advice given to you by your health care provider. Make sure you discuss any questions you have with your health care provider. Document Revised: 07/08/2020 Document  Reviewed: 07/08/2020 Elsevier Patient Education  2022 [employer].    If you have been instructed to have an in-person evaluation today at a local Urgent Care facility, please use the link below. It will take you to a list of all of our available [facility], including address, phone number and hours of operation. Please do not delay care.  [facility]  If you or a family member do not have a primary care provider, use the link below to schedule a visit and establish care. When you choose a [facility] primary care physician or advanced practice provider, you gain a long-term partner in health. Find a Primary Care Provider  Learn more about [facility] in-office and virtual care options: [place] [facility]

## 2021-06-06 NOTE — Progress Notes (Signed)
Virtual Visit Consent   Donna Haas, you are scheduled for a virtual visit with a [facility] provider today.     Just as with appointments in the office, your consent must be obtained to participate.  Your consent will be active for this visit and any virtual visit you may have with one of our providers in the next 365 days.     If you have a MyChart account, a copy of this consent can be sent to you electronically.  All virtual visits are billed to your insurance company just like a traditional visit in the office.    As this is a virtual visit, video technology does not allow for your provider to perform a traditional examination.  This may limit your provider's ability to fully assess your condition.  If your provider identifies any concerns that need to be evaluated in person or the need to arrange testing (such as labs, EKG, etc.), we will make arrangements to do so.     Although advances in technology are sophisticated, we cannot ensure that it will always work on either your end or our end.  If the connection with a video visit is poor, the visit may have to be switched to a telephone visit.  With either a video or telephone visit, we are not always able to ensure that we have a secure connection.     I need to obtain your verbal consent now.   Are you willing to proceed with your visit today?    Donna Haas has provided verbal consent on 06/06/2021 for a virtual visit (video or telephone).   Mar Daring, PA-C   Date: 06/06/2021 8:37 AM   Virtual Visit via Video Note   I, Mar Daring, connected with  Donna Haas  ([id number], 1976/02/26) on 06/06/21 at  8:30 AM EST by a video-enabled telemedicine application and verified that I am speaking with the correct person using two identifiers.  Location: Patient: Virtual Visit Location Patient: Home Provider: Virtual Visit Location Provider: Home Office   I discussed the limitations of evaluation and management  by telemedicine and the availability of in person appointments. The patient expressed understanding and agreed to proceed.    History of Present Illness: Donna Haas is a 46 y.o. who identifies as a female who was assigned female at birth, and is being seen today for ear pain and sore throat.  HPI: Otalgia  There is pain in the left ear. This is a new problem. The current episode started in the past 7 days (Tuesday night; ear symptoms started yesterday). The problem occurs constantly. The problem has been gradually worsening. There has been no fever. The pain is moderate. Associated symptoms include coughing, ear discharge (left), headaches, rhinorrhea (post nasal drainage) and a sore throat. Pertinent negatives include no neck pain. Associated symptoms comments: Dizziness, hoarse voice. She has tried acetaminophen and NSAIDs (tylenol cold and cough) for the symptoms. The treatment provided no relief. Her past medical history is significant for a chronic ear infection. There is no history of hearing loss or a tympanostomy tube.     Problems:  Patient Active Problem List   Diagnosis Date Noted   Migraine without aura and responsive to treatment 11/20/2020   Cervical nerve root disorder 11/20/2020   Female infertility due to ovulation failure 11/20/2020   B12 deficiency 11/20/2020   Obesity (BMI 30-39.9)    Diabetes mellitus with hyperglycemia ([place]) 06/14/2020   Anxiety, generalized 12/13/2019  Binge eating disorder 08/23/2019   Mild intermittent asthma without complication 95/62/1308   Hypertriglyceridemia 02/07/2016   Morbid obesity ([place]) 01/25/2016   Barrett's esophagus determined by biopsy 01/21/2016   Carpal tunnel syndrome, bilateral 01/21/2016   Hx gestational diabetes 01/21/2016   Neck pain 09/08/2012   INSOMNIA 09/21/2008   NEPHROLITHIASIS, HX OF 04/17/2008   THYROID NODULE 01/12/2008   GERD 05/30/2007   ENDOMETRIOSIS OF OTHER SPECIFIED SITES 05/30/2007   Lupus ([place])  05/30/2007   HERNIATED LUMBAR DISC 05/30/2007   BACK PAIN 05/30/2007    Allergies:  Allergies  Allergen Reactions   Compazine [Prochlorperazine] Anaphylaxis   Prednisone Anaphylaxis   Prochlorperazine Edisylate Anaphylaxis   Etodolac     Other reaction(s): Unknown   Farxiga [Dapagliflozin]     Constipation and headaches   Hydrocodone-Acetaminophen Nausea Only   Morphine And Related Nausea Only and Other (See Comments)    Mood swings, headache   Medications:  Current Outpatient Medications:    amoxicillin-clavulanate (AUGMENTIN) 875-125 MG tablet, Take 1 tablet by mouth 2 (two) times daily., Disp: 14 tablet, Rfl: 0   fluconazole (DIFLUCAN) 150 MG tablet, Take 1 tablet (150 mg total) by mouth once for 1 dose. May repeat in 72 hours if needed, Disp: 2 tablet, Rfl: 0   neomycin-polymyxin-hydrocortisone (CORTISPORIN) OTIC solution, Place 3 drops into the left ear 4 (four) times daily., Disp: 10 mL, Rfl: 0   ACCU-CHEK GUIDE test strip, USE UP TO 4 TIMES DAILY AS DIRECTED, Disp: , Rfl:    Accu-Chek Softclix Lancets lancets, SMARTSIG:Topical 1-4 Times Daily, Disp: , Rfl:    ALPRAZolam (XANAX) 1 MG tablet, Take 1 mg by mouth daily as needed., Disp: , Rfl:    BD PEN NEEDLE NANO 2ND GEN 32G X 4 MM MISC, 1 DOSE BY DOES NOT APPLY ROUTE 3 (THREE) TIMES DAILY BEFORE MEALS., Disp: , Rfl:    blood glucose meter kit and supplies, Dispense based on patient and insurance preference. Use up to four times daily as directed. (FOR ICD-10 E10.9, E11.9)., Disp: 1 each, Rfl: 0   clonazePAM (KLONOPIN) 1 MG tablet, Take 1 mg by mouth 3 (three) times daily as needed for anxiety., Disp: , Rfl:    Cyanocobalamin (B-12) 1000 MCG SUBL, Place under the tongue., Disp: , Rfl:    dextroamphetamine (DEXTROSTAT) 10 MG tablet, Take 10 mg by mouth daily., Disp: , Rfl:    escitalopram (LEXAPRO) 20 MG tablet, Take 10 tablets by mouth daily., Disp: , Rfl:    esomeprazole (NEXIUM) 20 MG capsule, Take 20 mg by mouth daily. , Disp:  , Rfl:    ferrous sulfate 325 (65 FE) MG tablet, Take 325 mg by mouth daily., Disp: , Rfl:    LANTUS SOLOSTAR 100 UNIT/ML Solostar Pen, INJECT 25-50 UNITS INTO THE SKIN DAILY., Disp: 15 mL, Rfl: 0   loratadine (CLARITIN) 10 MG tablet, Take 10 mg by mouth daily., Disp: , Rfl:    Magnesium 250 MG TABS, Take 1 tablet by mouth daily., Disp: , Rfl:    metFORMIN (GLUCOPHAGE-XR) 750 MG 24 hr tablet, Take 2 tablets (1,500 mg total) by mouth daily with breakfast., Disp: 180 tablet, Rfl: 0   Semaglutide, 1 MG/DOSE, 4 MG/3ML SOPN, Inject 1 mg as directed once a week., Disp: 9 mL, Rfl: 0   zolpidem (AMBIEN) 10 MG tablet, Take 10 mg by mouth at bedtime., Disp: , Rfl:   Observations/Objective: Patient is well-developed, well-nourished in no acute distress.  Resting comfortably at home.  Head is normocephalic,  atraumatic.  No labored breathing.  Speech is clear and coherent with logical content.  Patient is alert and oriented at baseline.    Assessment and Plan: 1. Non-recurrent acute suppurative otitis media of left ear without spontaneous rupture of tympanic membrane - amoxicillin-clavulanate (AUGMENTIN) 875-125 MG tablet; Take 1 tablet by mouth 2 (two) times daily.  Dispense: 14 tablet; Refill: 0 - neomycin-polymyxin-hydrocortisone (CORTISPORIN) OTIC solution; Place 3 drops into the left ear 4 (four) times daily.  Dispense: 10 mL; Refill: 0 - fluconazole (DIFLUCAN) 150 MG tablet; Take 1 tablet (150 mg total) by mouth once for 1 dose. May repeat in 72 hours if needed  Dispense: 2 tablet; Refill: 0  - Suspect left otitis media, possible some sinusitis - Augmentin prescribed - Cortisporin for ear (patient has tolerated HC) - May continue tylenol and ibuprofen as needed - Warm compress as needed - Push fluids - Rest - Seek in person evaluation if not improving or if symptoms worsen  Follow Up Instructions: I discussed the assessment and treatment plan with the patient. The patient was provided an  opportunity to ask questions and all were answered. The patient agreed with the plan and demonstrated an understanding of the instructions.  A copy of instructions were sent to the patient via MyChart unless otherwise noted below.   The patient was advised to call back or seek an in-person evaluation if the symptoms worsen or if the condition fails to improve as anticipated.  Time:  I spent 13 minutes with the patient via telehealth technology discussing the above problems/concerns.    Mar Daring, PA-C

## 2021-06-06 NOTE — Telephone Encounter (Signed)
°  Chief Complaint: left ear pain/pressure, sore throat, post nasal drip causing a cough, dizziness from ear pressure Symptoms: dizziness and ear pain Frequency: Started Tuesday night Pertinent Negatives: Patient denies Fever. Disposition: [] ED /[] Urgent Care (no appt availability in office) / [] Appointment(In office/virtual)/ [x]  [facility]/ [] Home Care/ [] Refused Recommended Disposition /[] [facility]/ []  Follow-up with PCP Additional Notes: I assisted pt with scheduling an 8:30 AM appt via MyChart virtual visit option for this morning since there were no virtual visits available with [facility] today.

## 2021-06-06 NOTE — Telephone Encounter (Signed)
Reason for Disposition  [1] MODERATE dizziness (e.g., interferes with normal activities) AND [2] has NOT been evaluated by physician for this  (Exception: dizziness caused by heat exposure, sudden standing, or poor fluid intake)  Answer Assessment - Initial Assessment Questions 1. DESCRIPTION: "Describe your dizziness."     Fullness in left ear, nausea and dizziness is from the pressure in my left ear.   It feels like fluid in my left ear.   Swallow it hurts and I'm coughing from the post nasal drip.   Both daughters are sick. 2. LIGHTHEADED: "Do you feel lightheaded?" (e.g., somewhat faint, woozy, weak upon standing)     Dizziness from the left ear pressure. Tuesday night this all started.   The ear pain is the worst.   I get ear infections.  3. VERTIGO: "Do you feel like either you or the room is spinning or tilting?" (i.e. vertigo)     *No Answer* 4. SEVERITY: "How bad is it?"  "Do you feel like you are going to faint?" "Can you stand and walk?"   - MILD: Feels slightly dizzy, but walking normally.   - MODERATE: Feels unsteady when walking, but not falling; interferes with normal activities (e.g., school, work).   - SEVERE: Unable to walk without falling, or requires assistance to walk without falling; feels like passing out now.      *No Answer* 5. ONSET:  "When did the dizziness begin?"     Tuesday  6. AGGRAVATING FACTORS: "Does anything make it worse?" (e.g., standing, change in head position)     *No Answer* 7. HEART RATE: "Can you tell me your heart rate?" "How many beats in 15 seconds?"  (Note: not all patients can do this)       *No Answer* 8. CAUSE: "What do you think is causing the dizziness?"     *No Answer* 9. RECURRENT SYMPTOM: "Have you had dizziness before?" If Yes, ask: "When was the last time?" "What happened that time?"     *No Answer* 10. OTHER SYMPTOMS: "Do you have any other symptoms?" (e.g., fever, chest pain, vomiting, diarrhea, bleeding)       *No Answer* 11.  PREGNANCY: "Is there any chance you are pregnant?" "When was your last menstrual period?"       *No Answer*  Protocols used: Dizziness - Lightheadedness-A-AH

## 2021-06-18 ENCOUNTER — Telehealth: Payer: Medicare Other

## 2021-06-18 ENCOUNTER — Telehealth: Payer: Medicare Other | Admitting: Nurse Practitioner

## 2021-06-18 DIAGNOSIS — J029 Acute pharyngitis, unspecified: Secondary | ICD-10-CM

## 2021-06-18 MED ORDER — CEFDINIR 300 MG PO CAPS: 300.0000 mg | ORAL_CAPSULE | Freq: Two times a day (BID) | ORAL | 0 refills | Status: DC

## 2021-06-18 MED ORDER — FLUCONAZOLE 150 MG PO TABS: 150.0000 mg | ORAL_TABLET | Freq: Once | ORAL | 0 refills | Status: AC

## 2021-06-18 NOTE — Patient Instructions (Signed)
Force fluids °Motrin or tylenol OTC °OTC decongestant °Throat lozenges if help °New toothbrush in 3 days ° °

## 2021-06-18 NOTE — Progress Notes (Signed)
? ?Virtual Visit Consent  ? ?Donna Haas, you are scheduled for a virtual visit with Mary-Margaret Hassell Done, [place], a [facility] provider, today.   ?  ?Just as with appointments in the office, your consent must be obtained to participate.  Your consent will be active for this visit and any virtual visit you may have with one of our providers in the next 365 days.   ?  ?If you have a MyChart account, a copy of this consent can be sent to you electronically.  All virtual visits are billed to your insurance company just like a traditional visit in the office.   ? ?As this is a virtual visit, video technology does not allow for your provider to perform a traditional examination.  This may limit your provider's ability to fully assess your condition.  If your provider identifies any concerns that need to be evaluated in person or the need to arrange testing (such as labs, EKG, etc.), we will make arrangements to do so.   ?  ?Although advances in technology are sophisticated, we cannot ensure that it will always work on either your end or our end.  If the connection with a video visit is poor, the visit may have to be switched to a telephone visit.  With either a video or telephone visit, we are not always able to ensure that we have a secure connection.    ? ?I need to obtain your verbal consent now.   Are you willing to proceed with your visit today? YES ?  ?Donna Haas has provided verbal consent on 06/18/2021 for a virtual visit (video or telephone). ?  ?Mary-Margaret Hassell Done, FNP  ? ?Date: 06/18/2021 8:08 AM ? ? ?Virtual Visit via Video Note  ? ?I, Mary-Margaret Hassell Done, connected with Donna Haas ([id number], [birth year]) on 06/18/21 at  8:15 AM EST by a video-enabled telemedicine application and verified that I am speaking with the correct person using two identifiers. ? ?Location: ?Patient: Virtual Visit Location Patient: Home ?Provider: Virtual Visit Location Provider: Mobile ?  ?I discussed the limitations of  evaluation and management by telemedicine and the availability of in person appointments. The patient expressed understanding and agreed to proceed.   ? ?History of Present Illness: ?Donna Haas is a 46 y.o. who identifies as a female who was assigned female at birth, and is being seen today for sore throat. ? ?HPI: Patient was seen for visit on 06/06/21 and was dx with ear infection and sinusitis. She was give augmentin an dear drops. She improved. Last night se developed a sore throat. ? ?Sore Throat  ?This is a new problem. The current episode started yesterday. The problem has been gradually worsening. Neither side of throat is experiencing more pain than the other. There has been no fever. The pain is at a severity of 8/10. The pain is moderate. Associated symptoms include congestion (mild), a hoarse voice, swollen glands and trouble swallowing. Associated symptoms comments: White patches on back of throat. She has had exposure to strep (has a 5byear old daughter in day care that brings germs hme all the time.). She has tried acetaminophen for the symptoms. The treatment provided mild relief.   ?Review of Systems  ?HENT:  Positive for congestion (mild), hoarse voice and trouble swallowing.   ? ?Problems:  ?Patient Active Problem List  ? Diagnosis Date Noted  ? Migraine without aura and responsive to treatment 11/20/2020  ? Cervical nerve root disorder 11/20/2020  ? Female  infertility due to ovulation failure 11/20/2020  ? B12 deficiency 11/20/2020  ? Obesity (BMI 30-39.9)   ? Diabetes mellitus with hyperglycemia ([place]) 06/14/2020  ? Anxiety, generalized 12/13/2019  ? Binge eating disorder 08/23/2019  ? Mild intermittent asthma without complication 04/54/0981  ? Hypertriglyceridemia 02/07/2016  ? Morbid obesity ([place]) 01/25/2016  ? Barrett's esophagus determined by biopsy 01/21/2016  ? Carpal tunnel syndrome, bilateral 01/21/2016  ? Hx gestational diabetes 01/21/2016  ? Neck pain 09/08/2012  ? INSOMNIA  09/21/2008  ? NEPHROLITHIASIS, HX OF 04/17/2008  ? THYROID NODULE 01/12/2008  ? GERD 05/30/2007  ? ENDOMETRIOSIS OF OTHER SPECIFIED SITES 05/30/2007  ? Lupus ([place]) 05/30/2007  ? HERNIATED LUMBAR DISC 05/30/2007  ? BACK PAIN 05/30/2007  ?  ?Allergies:  ?Allergies  ?Allergen Reactions  ? Compazine [Prochlorperazine] Anaphylaxis  ? Prednisone Anaphylaxis  ? Prochlorperazine Edisylate Anaphylaxis  ? Etodolac   ?  Other reaction(s): Unknown  ? [place] [Dapagliflozin]   ?  Constipation and headaches  ? Hydrocodone-Acetaminophen Nausea Only  ? Morphine And Related Nausea Only and Other (See Comments)  ?  Mood swings, headache  ? ?Medications:  ?Current Outpatient Medications:  ?  ACCU-CHEK GUIDE test strip, USE UP TO 4 TIMES DAILY AS DIRECTED, Disp: , Rfl:  ?  Accu-Chek Softclix Lancets lancets, SMARTSIG:Topical 1-4 Times Daily, Disp: , Rfl:  ?  ALPRAZolam (XANAX) 1 MG tablet, Take 1 mg by mouth daily as needed., Disp: , Rfl:  ?  amoxicillin-clavulanate (AUGMENTIN) 875-125 MG tablet, Take 1 tablet by mouth 2 (two) times daily., Disp: 14 tablet, Rfl: 0 ?  BD PEN NEEDLE NANO 2ND GEN 32G X 4 MM MISC, 1 DOSE BY DOES NOT APPLY ROUTE 3 (THREE) TIMES DAILY BEFORE MEALS., Disp: , Rfl:  ?  blood glucose meter kit and supplies, Dispense based on patient and insurance preference. Use up to four times daily as directed. (FOR ICD-10 E10.9, E11.9)., Disp: 1 each, Rfl: 0 ?  clonazePAM (KLONOPIN) 1 MG tablet, Take 1 mg by mouth 3 (three) times daily as needed for anxiety., Disp: , Rfl:  ?  Cyanocobalamin (B-12) 1000 MCG SUBL, Place under the tongue., Disp: , Rfl:  ?  dextroamphetamine (DEXTROSTAT) 10 MG tablet, Take 10 mg by mouth daily., Disp: , Rfl:  ?  escitalopram (LEXAPRO) 20 MG tablet, Take 10 tablets by mouth daily., Disp: , Rfl:  ?  esomeprazole (NEXIUM) 20 MG capsule, Take 20 mg by mouth daily. , Disp: , Rfl:  ?  ferrous sulfate 325 (65 FE) MG tablet, Take 325 mg by mouth daily., Disp: , Rfl:  ?  LANTUS SOLOSTAR 100 UNIT/ML  Solostar Pen, INJECT 25-50 UNITS INTO THE SKIN DAILY., Disp: 15 mL, Rfl: 0 ?  loratadine (CLARITIN) 10 MG tablet, Take 10 mg by mouth daily., Disp: , Rfl:  ?  Magnesium 250 MG TABS, Take 1 tablet by mouth daily., Disp: , Rfl:  ?  metFORMIN (GLUCOPHAGE-XR) 750 MG 24 hr tablet, Take 2 tablets (1,500 mg total) by mouth daily with breakfast., Disp: 180 tablet, Rfl: 0 ?  neomycin-polymyxin-hydrocortisone (CORTISPORIN) OTIC solution, Place 3 drops into the left ear 4 (four) times daily., Disp: 10 mL, Rfl: 0 ?  Semaglutide, 1 MG/DOSE, 4 MG/3ML SOPN, Inject 1 mg as directed once a week., Disp: 9 mL, Rfl: 0 ?  zolpidem (AMBIEN) 10 MG tablet, Take 10 mg by mouth at bedtime., Disp: , Rfl:  ? ?Observations/Objective: ?Patient is well-developed, well-nourished in no acute distress.  ?Resting comfortably  at home.  ?Head  is normocephalic, atraumatic.  ?No labored breathing.  ?Speech is clear and coherent with logical content.  ?Patient is alert and oriented at baseline.  ?Raspy voice ?Not able to visualize throat in vdeo ? ?Assessment and Plan: ? ?Donna Haas in today with chief complaint of Sore Throat ? ? ?1. Pharyngitis, unspecified etiology ?Force fluids ?Motrin or tylenol OTC ?OTC decongestant ?Throat lozenges if help ?New toothbrush in 3 days ? ?- cefdinir (OMNICEF) 300 MG capsule; Take 1 capsule (300 mg total) by mouth 2 (two) times daily. 1 po BID  Dispense: 20 capsule; Refill: 0 ?- fluconazole (DIFLUCAN) 150 MG tablet; Take 1 tablet (150 mg total) by mouth once for 1 dose.  Dispense: 1 tablet; Refill: 0 ? ? ? ?Follow Up Instructions: ?I discussed the assessment and treatment plan with the patient. The patient was provided an opportunity to ask questions and all were answered. The patient agreed with the plan and demonstrated an understanding of the instructions.  A copy of instructions were sent to the patient via MyChart. ? ?The patient was advised to call back or seek an in-person evaluation if the symptoms worsen  or if the condition fails to improve as anticipated. ? ?Time:  ?I spent 10 minutes with the patient via telehealth technology discussing the above problems/concerns.   ? ?Mary-Margaret Hassell Done, FNP ? ?

## 2021-06-19 NOTE — Progress Notes (Signed)
Name: Donna Haas   MRN: [id number]    DOB: 10/23/1975   Date:06/23/2021       Progress Note  Subjective  Chief Complaint  Follow Up  I connected with  [name]  on 06/23/21 at  2:00 PM EDT by a video enabled telemedicine application and verified that I am speaking with the correct person using two identifiers.  I discussed the limitations of evaluation and management by telemedicine and the availability of in person appointments. The patient expressed understanding and agreed to proceed with the virtual visit  Staff also discussed with the patient that there may be a patient responsible charge related to this service. Patient Location: at home  Provider Location: [facility] Additional Individuals present: alone   HPI  DMII: she was seen by Lona Millard NP in June, her A1C was at goal  At 6.6 %, she states she stopped by for A1C after her last visit but A1C not in our chart. Explained she needs to come in for A1C since it has been over 6 months. . FSBS has been between 120/130's, she is going back and forth on dose of insulin advised to only titrate every 3 days. She denies polyphagia, polydipsia or polyuria. She has tried statin therapy in the past but could not tolerate it , causes weakness/nausea and myopathy, she is willing to try Ezetimibe   Morbid obesity: she states ozempic is helping curb her appetite she thinks she is still losing weight about 5 lbs since Dec. Discussed considering SGL- 2 agonist if A1C remains below 7 %   Lupus: diagnosed years ago, no rashes at this time, feels achy and has joint aches. She used to go to [place] but not in years, Dr. Sanda Klein treated her with Plaquenil but it caused vision disturbances. She had an appointment with Dr. Posey Pronto in January 2023 but it was postponed because she was sick.   Thyroid nodule: used to see Dr. Richardson Landry and also lost to follow up, TSH within normal limits . She states Dr. Posey Pronto will check level and decide if she needs to see  Endo   Back pain: she states having back problems and went to [facility] taking muscle relaxer- Zanaflex,  and could not drive it today.   Patient Active Problem List   Diagnosis Date Noted   Migraine without aura and responsive to treatment 11/20/2020   Cervical nerve root disorder 11/20/2020   Female infertility due to ovulation failure 11/20/2020   B12 deficiency 11/20/2020   Obesity (BMI 30-39.9)    Diabetes mellitus with hyperglycemia ([place]) 06/14/2020   Anxiety, generalized 12/13/2019   Binge eating disorder 08/23/2019   Mild intermittent asthma without complication 58/52/7782   Hypertriglyceridemia 02/07/2016   Morbid obesity ([place]) 01/25/2016   Barrett's esophagus determined by biopsy 01/21/2016   Carpal tunnel syndrome, bilateral 01/21/2016   Hx gestational diabetes 01/21/2016   Neck pain 09/08/2012   INSOMNIA 09/21/2008   NEPHROLITHIASIS, HX OF 04/17/2008   THYROID NODULE 01/12/2008   GERD 05/30/2007   ENDOMETRIOSIS OF OTHER SPECIFIED SITES 05/30/2007   Lupus ([place]) 05/30/2007   HERNIATED LUMBAR [place] 05/30/2007   BACK PAIN 05/30/2007    Past Surgical History:  Procedure Laterality Date   APPENDECTOMY     CESAREAN SECTION     x2   CESAREAN SECTION WITH BILATERAL TUBAL LIGATION Bilateral 08/30/2015   Procedure: CESAREAN SECTION WITH BILATERAL TUBAL LIGATION;  Surgeon: Honor Loh Ward, MD;  Location: [facility] ORS;  Service: Obstetrics;  Laterality: Bilateral;   CHOLECYSTECTOMY     LAPAROSCOPY     x3   THYROID CYST EXCISION     Dr. Trena Platt, [place]    Family History  Problem Relation Age of Onset   Depression Mother        Bi-polar   Asthma Mother    Allergies Mother    Sleep apnea Mother    Allergies Sister    Diabetes Sister    Rheum arthritis Maternal Grandmother    Hypertension Paternal Grandmother    Macular degeneration Paternal Grandmother    Hyperlipidemia Other    Hypertension Other    Heart disease Other     Social History   Socioeconomic  History   Marital status: Divorced    Spouse name: Not on file   Number of children: 2   Years of education: Not on file   Highest education level: Associate degree: academic program  Occupational History   Occupation: disability    Employer: UNEMPLOYED  Tobacco Use   Smoking status: Never   Smokeless tobacco: Never  Vaping Use   Vaping Use: Never used  Substance and Sexual Activity   Alcohol use: No    Alcohol/week: 1.0 - 2.0 standard drink    Types: 1 - 2 Standard drinks or equivalent per week    Comment: discontinued use during pregnancy   Drug use: No   Sexual activity: Not Currently    Partners: Male  Other Topics Concern   Not on file  Social History Narrative   Not on file   Social Determinants of Health   Financial Resource Strain: Low Risk    Difficulty of Paying Living Expenses: Not hard at all  Food Insecurity: No Food Insecurity   Worried About Charity fundraiser in the Last Year: Never true   [place] in the Last Year: Never true  Transportation Needs: No Transportation Needs   Lack of Transportation (Medical): No   Lack of Transportation (Non-Medical): No  Physical Activity: Sufficiently Active   Days of Exercise per Week: 5 days   Minutes of Exercise per Session: 30 min  Stress: No Stress Concern Present   Feeling of Stress : Not at all  Social Connections: Moderately Integrated   Frequency of Communication with Friends and Family: More than three times a week   Frequency of Social Gatherings with Friends and Family: More than three times a week   Attends Religious Services: More than 4 times per year   Active Member of [employer] or Organizations: Yes   Attends Music therapist: More than 4 times per year   Marital Status: Divorced  Human resources officer Violence: Not At Risk   Fear of Current or Ex-Partner: No   Emotionally Abused: No   Physically Abused: No   Sexually Abused: No     Current Outpatient Medications:    ACCU-CHEK  GUIDE test strip, USE UP TO 4 TIMES DAILY AS DIRECTED, Disp: , Rfl:    Accu-Chek Softclix Lancets lancets, SMARTSIG:Topical 1-4 Times Daily, Disp: , Rfl:    ALPRAZolam (XANAX) 1 MG tablet, Take 1 mg by mouth daily as needed., Disp: , Rfl:    BD PEN NEEDLE NANO 2ND GEN 32G X 4 MM MISC, 1 DOSE BY DOES NOT APPLY ROUTE 3 (THREE) TIMES DAILY BEFORE MEALS., Disp: , Rfl:    blood glucose meter kit and supplies, Dispense based on patient and insurance preference. Use up to four times daily as directed. (FOR ICD-10  E10.9, E11.9)., Disp: 1 each, Rfl: 0   cefdinir (OMNICEF) 300 MG capsule, Take 1 capsule (300 mg total) by mouth 2 (two) times daily. 1 po BID, Disp: 20 capsule, Rfl: 0   Cyanocobalamin (B-12) 1000 MCG SUBL, Place under the tongue., Disp: , Rfl:    escitalopram (LEXAPRO) 20 MG tablet, Take 10 tablets by mouth daily., Disp: , Rfl:    esomeprazole (NEXIUM) 20 MG capsule, Take 20 mg by mouth daily. , Disp: , Rfl:    ferrous sulfate 325 (65 FE) MG tablet, Take 325 mg by mouth daily., Disp: , Rfl:    LANTUS SOLOSTAR 100 UNIT/ML Solostar Pen, INJECT 25-50 UNITS INTO THE SKIN DAILY., Disp: 15 mL, Rfl: 0   loratadine (CLARITIN) 10 MG tablet, Take 10 mg by mouth daily., Disp: , Rfl:    Magnesium 250 MG TABS, Take 1 tablet by mouth daily., Disp: , Rfl:    metFORMIN (GLUCOPHAGE-XR) 750 MG 24 hr tablet, Take 2 tablets (1,500 mg total) by mouth daily with breakfast., Disp: 180 tablet, Rfl: 0   Semaglutide, 1 MG/DOSE, 4 MG/3ML SOPN, Inject 1 mg as directed once a week., Disp: 9 mL, Rfl: 0   zolpidem (AMBIEN) 10 MG tablet, Take 10 mg by mouth at bedtime., Disp: , Rfl:   Allergies  Allergen Reactions   Compazine [Prochlorperazine] Anaphylaxis   Prednisone Anaphylaxis   Prochlorperazine Edisylate Anaphylaxis   Etodolac     Other reaction(s): Unknown   Farxiga [Dapagliflozin]     Constipation and headaches   Hydrocodone-Acetaminophen Nausea Only   Morphine And Related Nausea Only and Other (See Comments)     Mood swings, headache    I personally reviewed active problem list, medication list, allergies, family history, social history, health maintenance with the patient/caregiver today.   ROS  Ten systems reviewed and is negative except as mentioned in HPI   Objective  Virtual encounter, vitals not obtained.  There is no height or weight on file to calculate BMI.  Physical Exam  Awake, alert and oriented  PHQ2/9: Depression screen [facility] 2/9 06/23/2021 03/25/2021 01/28/2021 01/14/2021 11/20/2020  Decreased Interest 0 0 0 0 0  Down, Depressed, Hopeless 0 0 0 0 1  PHQ - 2 Score 0 0 0 0 1  Altered sleeping 0 0 0 0 1  Tired, decreased energy 0 0 0 0 0  Change in appetite 0 0 0 0 0  Feeling bad or failure about yourself  0 0 0 0 0  Trouble concentrating 0 0 0 1 3  Moving slowly or fidgety/restless 0 0 0 0 0  Suicidal thoughts 0 0 0 0 0  PHQ-9 Score 0 0 0 1 5  Difficult doing work/chores - - Not difficult at all - -  Some recent data might be hidden   PHQ-2/9 Result is negative.    Fall Risk: Fall Risk  06/23/2021 03/25/2021 01/28/2021 01/14/2021 11/20/2020  Falls in the past year? 0 0 0 1 1  Number falls in past yr: 0 0 0 1 1  Injury with Fall? 0 0 0 1 1  Risk for fall due to : No Fall Risks No Fall Risks No Fall Risks History of fall(s) -  Follow up Falls prevention discussed Falls prevention discussed Falls prevention discussed Falls prevention discussed -     Assessment & Plan   1. Dyslipidemia associated with type 2 diabetes mellitus (HCC)  - Semaglutide, 1 MG/DOSE, 4 MG/3ML SOPN; Inject 1 mg as directed once a week.  Dispense: 9 mL; Refill: 0 - metFORMIN (GLUCOPHAGE-XR) 750 MG 24 hr tablet; Take 2 tablets (1,500 mg total) by mouth daily with breakfast.  Dispense: 180 tablet; Refill: 0  2. Morbid obesity ([place])  Discussed with the patient the risk posed by an increased BMI. Discussed importance of portion control, calorie counting and at least 150 minutes of physical  activity weekly. Avoid sweet beverages and drink more water. Eat at least 6 servings of fruit and vegetables daily    3. Mild intermittent asthma without complication  Stable   4. Breast cancer screening by mammogram  Reminded her importance of yearly mammogram.  5. Cervical cancer screening   Explained if gyn cannot do her pap soon I will collect it during her next visit in June    I discussed the assessment and treatment plan with the patient. The patient was provided an opportunity to ask questions and all were answered. The patient agreed with the plan and demonstrated an understanding of the instructions.  The patient was advised to call back or seek an in-person evaluation if the symptoms worsen or if the condition fails to improve as anticipated.  I provided 25  minutes of non-face-to-face time during this encounter.

## 2021-06-23 ENCOUNTER — Encounter: Payer: Self-pay | Admitting: Family Medicine

## 2021-06-23 ENCOUNTER — Telehealth (INDEPENDENT_AMBULATORY_CARE_PROVIDER_SITE_OTHER): Payer: Medicare Other | Admitting: Family Medicine

## 2021-06-23 DIAGNOSIS — E785 Hyperlipidemia, unspecified: Secondary | ICD-10-CM

## 2021-06-23 DIAGNOSIS — Z1231 Encounter for screening mammogram for malignant neoplasm of breast: Secondary | ICD-10-CM | POA: Diagnosis not present

## 2021-06-23 DIAGNOSIS — Z124 Encounter for screening for malignant neoplasm of cervix: Secondary | ICD-10-CM

## 2021-06-23 DIAGNOSIS — J4520 Mild intermittent asthma, uncomplicated: Secondary | ICD-10-CM | POA: Diagnosis not present

## 2021-06-23 DIAGNOSIS — E1169 Type 2 diabetes mellitus with other specified complication: Secondary | ICD-10-CM

## 2021-06-23 MED ORDER — METFORMIN HCL ER 750 MG PO TB24: 1500.0000 mg | ORAL_TABLET | Freq: Every day | ORAL | 0 refills | Status: DC

## 2021-06-23 MED ORDER — SEMAGLUTIDE (1 MG/DOSE) 4 MG/3ML ~~LOC~~ SOPN: 1.0000 mg | PEN_INJECTOR | SUBCUTANEOUS | 0 refills | Status: DC

## 2021-06-25 ENCOUNTER — Ambulatory Visit: Payer: Medicare Other

## 2021-06-25 ENCOUNTER — Other Ambulatory Visit: Payer: Self-pay

## 2021-06-25 DIAGNOSIS — E1169 Type 2 diabetes mellitus with other specified complication: Secondary | ICD-10-CM

## 2021-06-25 DIAGNOSIS — Z794 Long term (current) use of insulin: Secondary | ICD-10-CM

## 2021-06-25 DIAGNOSIS — E1165 Type 2 diabetes mellitus with hyperglycemia: Secondary | ICD-10-CM

## 2021-06-27 ENCOUNTER — Ambulatory Visit: Payer: Medicare Other

## 2021-07-03 ENCOUNTER — Ambulatory Visit: Payer: Medicare Other

## 2021-07-08 ENCOUNTER — Telehealth: Payer: Medicare Other | Admitting: Family Medicine

## 2021-07-08 DIAGNOSIS — H9209 Otalgia, unspecified ear: Secondary | ICD-10-CM

## 2021-07-08 NOTE — Patient Instructions (Signed)
Please follow up in person so that you can get the best treatment for your recurring ear pain.  ?

## 2021-07-08 NOTE — Progress Notes (Signed)
[place]  ? ?Patient was recommended to be see in person due to having reoccurring symptoms over the last 30 days. Reports recurring ear pain today with a cough. Was treated 20 days ago for strep- with Omnicef. Almost 2 weeks prior had Augmentin, ear drops as well for ear infection and possible sinus infection. Patient upset about recommendation to be seen in person for best care and treatment. Declines UC due to having Lupus, educated on making appts now so she doesn't have to wait. Reports PCP can not see her either.  ? ?Additionally: Patient appeared to be in a car during visit. She was stopped at start of visit.  ?She started driving and was advised of safe measures that are needed for visit. " I have been driving for 28 years, I am not looking at my phone. Then said good bye and ended the visit"  ? ? ? ? ?

## 2021-07-09 ENCOUNTER — Telehealth (INDEPENDENT_AMBULATORY_CARE_PROVIDER_SITE_OTHER): Payer: Medicare Other | Admitting: Family Medicine

## 2021-07-09 ENCOUNTER — Encounter: Payer: Self-pay | Admitting: Family Medicine

## 2021-07-09 ENCOUNTER — Other Ambulatory Visit: Payer: Self-pay | Admitting: Family Medicine

## 2021-07-09 DIAGNOSIS — R0602 Shortness of breath: Secondary | ICD-10-CM | POA: Diagnosis not present

## 2021-07-09 DIAGNOSIS — J4521 Mild intermittent asthma with (acute) exacerbation: Secondary | ICD-10-CM

## 2021-07-09 DIAGNOSIS — R051 Acute cough: Secondary | ICD-10-CM | POA: Diagnosis not present

## 2021-07-09 MED ORDER — ALBUTEROL SULFATE HFA 108 (90 BASE) MCG/ACT IN AERS: 2.0000 | INHALATION_SPRAY | Freq: Four times a day (QID) | RESPIRATORY_TRACT | 0 refills | Status: DC | PRN

## 2021-07-09 MED ORDER — DEXAMETHASONE 2 MG PO TABS: ORAL_TABLET | ORAL | 0 refills | Status: DC

## 2021-07-09 NOTE — Patient Instructions (Addendum)
 It was great to see you!  Our plans for today:  - See below for self-isolation guidelines. You may end your quarantine if your test is negative, or if positive, once you are 10 days from symptom onset and fever free for 24 hours without use of tylenol  or ibuprofen . Wear a well-fitting N95 mask if you have to go out and about. - If your test is positive, we will be prescribing an antiviral used to treat COVID called paxlovid . See [email or web address].[email or web address] for more information about the medication. - I recommend getting vaccinated with your updated booster if you haven't already done so once you are healed from your current infection. - Certainly, if you are having difficulties breathing or unable to keep down fluids, go to the Emergency Department.   Take care and seek immediate care sooner if you develop any concerns.   Dr. Madelon     Person Under Monitoring Name: Donna Haas  Location: [address] [place] [address]   Infection Prevention Recommendations for Individuals Confirmed to have, or Being Evaluated for, 2019 Novel Coronavirus (COVID-19) Infection Who Receive Care at Home  Individuals who are confirmed to have, or are being evaluated for, COVID-19 should follow the prevention steps below until a healthcare provider or local or state health department says they can return to normal activities.  Stay home except to get medical care You should restrict activities outside your home, except for getting medical care. Do not go to work, school, or public areas, and do not use public transportation or taxis.  Call ahead before visiting your doctor Before your medical appointment, call the healthcare provider and tell them that you have, or are being evaluated for, COVID-19 infection. This will help the healthcare providers office take steps to keep other people from getting infected. Ask your healthcare provider to call the local or state health  department.  Monitor your symptoms Seek prompt medical attention if your illness is worsening (e.g., difficulty breathing). Before going to your medical appointment, call the healthcare provider and tell them that you have, or are being evaluated for, COVID-19 infection. Ask your healthcare provider to call the local or state health department.  Wear a facemask You should wear a facemask that covers your nose and mouth when you are in the same room with other people and when you visit a healthcare provider. People who live with or visit you should also wear a facemask while they are in the same room with you.  Separate yourself from other people in your home As much as possible, you should stay in a different room from other people in your home. Also, you should use a separate bathroom, if available.  Avoid sharing household items You should not share dishes, drinking glasses, cups, eating utensils, towels, bedding, or other items with other people in your home. After using these items, you should wash them thoroughly with soap and water.  Cover your coughs and sneezes Cover your mouth and nose with a tissue when you cough or sneeze, or you can cough or sneeze into your sleeve. Throw used tissues in a lined trash can, and immediately wash your hands with soap and water for at least 20 seconds or use an alcohol-based hand rub.  Wash your [employer] your hands often and thoroughly with soap and water for at least 20 seconds. You can use an alcohol-based hand sanitizer if soap and water are not available and if your hands are not visibly dirty. Avoid touching  your eyes, nose, and mouth with unwashed hands.   Prevention Steps for Caregivers and Household Members of Individuals Confirmed to have, or Being Evaluated for, COVID-19 Infection Being Cared for in the Home  If you live with, or provide care at home for, a person confirmed to have, or being evaluated for, COVID-19  infection please follow these guidelines to prevent infection:  Follow healthcare providers instructions Make sure that you understand and can help the patient follow any healthcare provider instructions for all care.  Provide for the patients basic needs You should help the patient with basic needs in the home and provide support for getting groceries, prescriptions, and other personal needs.  Monitor the patients symptoms If they are getting sicker, call his or her medical provider and tell them that the patient has, or is being evaluated for, COVID-19 infection. This will help the healthcare providers office take steps to keep other people from getting infected. Ask the healthcare provider to call the local or state health department.  Limit the number of people who have contact with the patient If possible, have only one caregiver for the patient. Other household members should stay in another home or place of residence. If this is not possible, they should stay in another room, or be separated from the patient as much as possible. Use a separate bathroom, if available. Restrict visitors who do not have an essential need to be in the home.  Keep older adults, very young children, and other sick people away from the patient Keep older adults, very young children, and those who have compromised immune systems or chronic health conditions away from the patient. This includes people with chronic heart, lung, or kidney conditions, diabetes, and cancer.  Ensure good ventilation Make sure that shared spaces in the home have good air flow, such as from an air conditioner or an opened window, weather permitting.  Wash your hands often Wash your hands often and thoroughly with soap and water for at least 20 seconds. You can use an alcohol based hand sanitizer if soap and water are not available and if your hands are not visibly dirty. Avoid touching your eyes, nose, and mouth with  unwashed hands. Use disposable paper towels to dry your hands. If not available, use dedicated cloth towels and replace them when they become wet.  Wear a facemask and gloves Wear a disposable facemask at all times in the room and gloves when you touch or have contact with the patients blood, body fluids, and/or secretions or excretions, such as sweat, saliva, sputum, nasal mucus, vomit, urine, or feces.  Ensure the mask fits over your nose and mouth tightly, and do not touch it during use. Throw out disposable facemasks and gloves after using them. Do not reuse. Wash your hands immediately after removing your facemask and gloves. If your personal clothing becomes contaminated, carefully remove clothing and launder. Wash your hands after handling contaminated clothing. Place all used disposable facemasks, gloves, and other waste in a lined container before disposing them with other household waste. Remove gloves and wash your hands immediately after handling these items.  Do not share dishes, glasses, or other household items with the patient Avoid sharing household items. You should not share dishes, drinking glasses, cups, eating utensils, towels, bedding, or other items with a patient who is confirmed to have, or being evaluated for, COVID-19 infection. After the person uses these items, you should wash them thoroughly with soap and water.  Wash laundry thoroughly  Immediately remove and wash clothes or bedding that have blood, body fluids, and/or secretions or excretions, such as sweat, saliva, sputum, nasal mucus, vomit, urine, or feces, on them. Wear gloves when handling laundry from the patient. Read and follow directions on labels of laundry or clothing items and detergent. In general, wash and dry with the warmest temperatures recommended on the label.  Clean all areas the individual has used often Clean all touchable surfaces, such as counters, tabletops, doorknobs, bathroom fixtures,  toilets, phones, keyboards, tablets, and bedside tables, every day. Also, clean any surfaces that may have blood, body fluids, and/or secretions or excretions on them. Wear gloves when cleaning surfaces the patient has come in contact with. Use a diluted bleach solution (e.g., dilute bleach with 1 part bleach and 10 parts water) or a household disinfectant with a label that says EPA-registered for coronaviruses. To make a bleach solution at home, add 1 tablespoon of bleach to 1 quart (4 cups) of water. For a larger supply, add  cup of bleach to 1 gallon (16 cups) of water. Read labels of cleaning products and follow recommendations provided on product labels. Labels contain instructions for safe and effective use of the cleaning product including precautions you should take when applying the product, such as wearing gloves or eye protection and making sure you have good ventilation during use of the product. Remove gloves and wash hands immediately after cleaning.  Monitor yourself for signs and symptoms of illness Caregivers and household members are considered close contacts, should monitor their health, and will be asked to limit movement outside of the home to the extent possible. Follow the monitoring steps for close contacts listed on the symptom monitoring form.   ? If you have additional questions, contact your local health department or call the epidemiologist on call at [phone] (available 24/7). ? This guidance is subject to change. For the most up-to-date guidance from [facility], please refer to their website: [email or web address]

## 2021-07-09 NOTE — Progress Notes (Signed)
Virtual Visit via Video Note ? ?I connected with Donna Haas on 07/09/21 at  9:00 AM EDT by a video enabled telemedicine application and verified that I am speaking with the correct person using two identifiers. ? ?Location: ?Patient: Donna Haas ?Provider: [facility] ?  ?I discussed the limitations of evaluation and management by telemedicine and the availability of in person appointments. The patient expressed understanding and agreed to proceed. ? ?History of Present Illness: ? ?UPPER RESPIRATORY TRACT INFECTION ?- symptom onset 07/06/21 ?- has not tested for COVID. Had COVID twice, most recent 04/2020. ?- has received 2 doses of mRNA COVID vaccine ? ?Fever: no, low temp 69F ?Cough: yes, productive ?Shortness of breath: yes ?Wheezing: yes ?Chest pain: no ?Chest tightness: yes ?Chest congestion: yes ?Nasal congestion: no ?Runny nose: no ?Sneezing: no ?Sore throat: yes ?Headache: no ?Ear pain: yes "right ?Vomiting: no, some nausea ?Sick contacts: yes, daughter with similar symptoms ?Relief with OTC cold/cough medications: no  ?Treatments attempted: mucinex, delsum, claritin, benadryl, albuterol (helps some). ?  ? ?Observations/Objective: ? ?Tired appearing, in NAD. Speaks in full sentences. Comfortable WOB on RA. No resp distress.  ? ? ?Assessment and Plan: ? ?Viral URI ?Acute asthma exacerbation ?Will test for COVID, would be a candidate for antiviral treatment if positive. Will also obtain CXR to evaluate for PNA given some shortness of breath, chest heaviness, and low temp. Rx steroids for exacerbation, albuterol refilled.  Reviewed OTC symptom relief, self-quarantine guidelines, and emergency precautions.  ? ? ?  ?I discussed the assessment and treatment plan with the patient. The patient was provided an opportunity to ask questions and all were answered. The patient agreed with the plan and demonstrated an understanding of the instructions. ?  ?The patient was advised to call back or seek an in-person evaluation if the  symptoms worsen or if the condition fails to improve as anticipated. ? ?I provided 9 minutes of non-face-to-face time during this encounter. ? ? ?Myles Gip, DO ?

## 2021-07-10 NOTE — Telephone Encounter (Signed)
Requested medications are due for refill today.  unsure ? ?Requested medications are on the active medications list.  yes ? ?Last refill. 07/09/2021 ? ?Future visit scheduled.   no ? ?Notes to clinic.  Pharmacy is requesting and alternate medication. Pharmacy comment: Alternative Requested:INSURANCE COVERS GENERIC PROAIR HFA. ? ? ? ?Requested Prescriptions  ?Pending Prescriptions Disp Refills  ? albuterol (VENTOLIN HFA) 108 (90 Base) MCG/ACT inhaler [Pharmacy Med Name: ALBUTEROL HFA (PROAIR) INHALER] 8.5 each 0  ?  ? Pulmonology:  Beta Agonists 2 Passed - 07/09/2021  9:16 AM  ?  ?  Passed - Last BP in normal range  ?  BP Readings from Last 1 Encounters:  ?11/20/20 130/84  ?  ?  ?  ?  Passed - Last Heart Rate in normal range  ?  Pulse Readings from Last 1 Encounters:  ?11/20/20 95  ?  ?  ?  ?  Passed - Valid encounter within last 12 months  ?  Recent Outpatient Visits   ? ?      ? Yesterday Acute cough  ? [place], DO  ? 2 weeks ago Dyslipidemia associated with type 2 diabetes mellitus ([place])  ? [facility] [place], Drue Stager, MD  ? 3 months ago Dyslipidemia associated with type 2 diabetes mellitus ([place])  ? [facility] [place], Drue Stager, MD  ? 5 months ago Lymphadenopathy  ? [facility] Bo Merino, FNP  ? 7 months ago Diabetes mellitus type 2 in obese [facility])  ? [facility] Steele Sizer, MD  ? ?  ?  ? ?  ?  ?  ?  ?

## 2021-07-11 ENCOUNTER — Ambulatory Visit: Payer: Medicare Other

## 2021-07-11 ENCOUNTER — Other Ambulatory Visit: Payer: Self-pay | Admitting: Family Medicine

## 2021-07-11 ENCOUNTER — Other Ambulatory Visit: Payer: Self-pay

## 2021-07-11 NOTE — Telephone Encounter (Signed)
Requested medication (s) are due for refill today - no ? ?Requested medication (s) are on the active medication list -yes ? ?Future visit scheduled -no ? ?Last refill: 07/09/21 ? ?Notes to clinic: Pharmacy request alternative- generic Proair- sent for PCP review  ? ?Requested Prescriptions  ?Pending Prescriptions Disp Refills  ? albuterol (VENTOLIN HFA) 108 (90 Base) MCG/ACT inhaler [Pharmacy Med Name: ALBUTEROL HFA (PROAIR) INHALER] 8.5 each 0  ?  ? Pulmonology:  Beta Agonists 2 Passed - 07/11/2021 10:00 AM  ?  ?  Passed - Last BP in normal range  ?  BP Readings from Last 1 Encounters:  ?11/20/20 130/84  ?  ?  ?  ?  Passed - Last Heart Rate in normal range  ?  Pulse Readings from Last 1 Encounters:  ?11/20/20 95  ?  ?  ?  ?  Passed - Valid encounter within last 12 months  ?  Recent Outpatient Visits   ? ?      ? 2 days ago Acute cough  ? [place], DO  ? 2 weeks ago Dyslipidemia associated with type 2 diabetes mellitus ([place])  ? [facility] [place], Drue Stager, MD  ? 3 months ago Dyslipidemia associated with type 2 diabetes mellitus ([place])  ? [facility] [place], Drue Stager, MD  ? 5 months ago Lymphadenopathy  ? [facility] Bo Merino, FNP  ? 7 months ago Diabetes mellitus type 2 in obese [facility])  ? [facility] Steele Sizer, MD  ? ?  ?  ? ?  ?  ?  ? ? ? ?Requested Prescriptions  ?Pending Prescriptions Disp Refills  ? albuterol (VENTOLIN HFA) 108 (90 Base) MCG/ACT inhaler [Pharmacy Med Name: ALBUTEROL HFA (PROAIR) INHALER] 8.5 each 0  ?  ? Pulmonology:  Beta Agonists 2 Passed - 07/11/2021 10:00 AM  ?  ?  Passed - Last BP in normal range  ?  BP Readings from Last 1 Encounters:  ?11/20/20 130/84  ?  ?  ?  ?  Passed - Last Heart Rate in normal range  ?  Pulse Readings from Last 1 Encounters:  ?11/20/20 95  ?  ?  ?  ?  Passed - Valid encounter within last 12 months  ?  Recent Outpatient Visits   ? ?      ? 2  days ago Acute cough  ? [place], DO  ? 2 weeks ago Dyslipidemia associated with type 2 diabetes mellitus ([place])  ? [facility] [place], Drue Stager, MD  ? 3 months ago Dyslipidemia associated with type 2 diabetes mellitus ([place])  ? [facility] [place], Drue Stager, MD  ? 5 months ago Lymphadenopathy  ? [facility] Bo Merino, FNP  ? 7 months ago Diabetes mellitus type 2 in obese [facility])  ? [facility] Steele Sizer, MD  ? ?  ?  ? ?  ?  ?  ? ? ? ?

## 2021-07-14 ENCOUNTER — Other Ambulatory Visit: Payer: Self-pay

## 2021-07-14 NOTE — Telephone Encounter (Signed)
Requested medication (s) are due for refill today:   Yes ? ?Requested medication (s) are on the active medication list:   Yes ? ?Future visit scheduled:   No   Seen 5 days ago ? ? ?Last ordered: 07/09/2021 8 g, 0 refills ? ?Returned because pharmacy requesting generic Proair HAF  See pharmacy note ? ?Requested Prescriptions  ?Pending Prescriptions Disp Refills  ? albuterol (VENTOLIN HFA) 108 (90 Base) MCG/ACT inhaler [Pharmacy Med Name: ALBUTEROL HFA (PROAIR) INHALER] 8.5 each 0  ?  ? Pulmonology:  Beta Agonists 2 Passed - 07/11/2021  5:52 PM  ?  ?  Passed - Last BP in normal range  ?  BP Readings from Last 1 Encounters:  ?11/20/20 130/84  ?  ?  ?  ?  Passed - Last Heart Rate in normal range  ?  Pulse Readings from Last 1 Encounters:  ?11/20/20 95  ?  ?  ?  ?  Passed - Valid encounter within last 12 months  ?  Recent Outpatient Visits   ? ?      ? 5 days ago Acute cough  ? [place], DO  ? 3 weeks ago Dyslipidemia associated with type 2 diabetes mellitus ([place])  ? [facility] [place], Drue Stager, MD  ? 3 months ago Dyslipidemia associated with type 2 diabetes mellitus ([place])  ? [facility] [place], Drue Stager, MD  ? 5 months ago Lymphadenopathy  ? [facility] Bo Merino, FNP  ? 7 months ago Diabetes mellitus type 2 in obese [facility])  ? [facility] Steele Sizer, MD  ? ?  ?  ? ?  ?  ?  ? ?

## 2021-07-29 ENCOUNTER — Other Ambulatory Visit: Payer: Self-pay | Admitting: Family Medicine

## 2021-07-29 ENCOUNTER — Other Ambulatory Visit: Payer: Self-pay

## 2021-07-29 DIAGNOSIS — E1169 Type 2 diabetes mellitus with other specified complication: Secondary | ICD-10-CM

## 2021-07-29 DIAGNOSIS — Z794 Long term (current) use of insulin: Secondary | ICD-10-CM

## 2021-07-29 NOTE — Telephone Encounter (Signed)
Orders placed, patient notified.

## 2021-07-31 ENCOUNTER — Other Ambulatory Visit: Payer: Self-pay | Admitting: Family Medicine

## 2021-08-01 ENCOUNTER — Other Ambulatory Visit: Payer: Self-pay | Admitting: Family Medicine

## 2021-08-01 LAB — MICROALBUMIN / CREATININE URINE RATIO
Creatinine, Urine: 122 mg/dL (ref 20–275)
Microalb Creat Ratio: 5 ug/mg{creat}
Microalb, Ur: 0.6 mg/dL

## 2021-08-01 LAB — HEMOGLOBIN A1C
Hgb A1c MFr Bld: 6.3 %{Hb} — ABNORMAL HIGH
Mean Plasma Glucose: 134 mg/dL
eAG (mmol/L): 7.4 mmol/L

## 2021-08-01 MED ORDER — LANTUS SOLOSTAR 100 UNIT/ML ~~LOC~~ SOPN: 25.0000 [IU] | PEN_INJECTOR | Freq: Every day | SUBCUTANEOUS | 0 refills | Status: DC

## 2021-08-28 ENCOUNTER — Encounter: Payer: Self-pay | Admitting: Pharmacist

## 2021-08-28 ENCOUNTER — Other Ambulatory Visit: Payer: Self-pay | Admitting: Family Medicine

## 2021-08-28 DIAGNOSIS — E1169 Type 2 diabetes mellitus with other specified complication: Secondary | ICD-10-CM

## 2021-08-28 NOTE — Progress Notes (Deleted)
[facility]) Care Management  [place]   08/28/2021  Donna Haas 03/26/1976 [id number]   Reason for referral: ***  Referral source: *** Current insurance:***  PMHx: ***   HPI: ***   Objective: Lab Results  Component Value Date   CREATININE 0.74 10/23/2020   CREATININE 0.70 06/17/2020   CREATININE 0.67 06/16/2020    Lab Results  Component Value Date   HGBA1C 6.3 (H) 07/31/2021    Lipid Panel     Component Value Date/Time   CHOL 191 10/23/2020 1411   TRIG 227 (H) 10/23/2020 1411   HDL 43 (L) 10/23/2020 1411   CHOLHDL 4.4 10/23/2020 1411   VLDL NOT CALC 01/21/2016 1627   LDLCALC 114 (H) 10/23/2020 1411   LDLDIRECT 132.4 05/30/2007 1046    BP Readings from Last 3 Encounters:  11/20/20 130/84  10/23/20 124/78  07/03/20 102/68    Allergies  Allergen Reactions   Compazine [Prochlorperazine] Anaphylaxis   Prednisone Anaphylaxis   Prochlorperazine Edisylate Anaphylaxis   Etodolac     Other reaction(s): Unknown   Farxiga [Dapagliflozin]     Constipation and headaches   Hydrocodone-Acetaminophen Nausea Only   Morphine And Related Nausea Only and Other (See Comments)    Mood swings, headache    Medications Reviewed Today     Reviewed by Myles Gip, DO (Physician) on 07/09/21 at [place] List Status: <None>   Medication Order Taking? Sig Documenting Provider Last Dose Status Informant  ACCU-CHEK GUIDE test strip [id number] Yes USE UP TO 4 TIMES DAILY AS DIRECTED [provider] Taking Active   Accu-Chek Softclix Lancets lancets [id number] Yes SMARTSIG:Topical 1-4 Times Daily [provider] Taking Active   albuterol (VENTOLIN HFA) 108 (90 Base) MCG/ACT inhaler [id number] Yes Inhale 2 puffs into the lungs every 6 (six) hours as needed for wheezing or shortness of breath. Myles Gip, DO  Active   ALPRAZolam Duanne Moron) 1 MG tablet [id number] Yes Take 1 mg by mouth daily as needed. Chucky May, MD Taking  Active   BD PEN NEEDLE NANO 2ND GEN 32G X 4 MM MISC [id number] Yes 1 DOSE BY DOES NOT APPLY ROUTE 3 (THREE) TIMES DAILY BEFORE MEALS. [provider] Taking Active   blood glucose meter kit and supplies [id number] Yes Dispense based on patient and insurance preference. Use up to four times daily as directed. (FOR ICD-10 E10.9, E11.9). Steele Sizer, MD Taking Active   Cyanocobalamin (B-12) 1000 MCG SUBL [id number] Yes Place under the tongue. [provider] Taking Active   dexamethasone (DECADRON) 2 MG tablet [id number] Yes Take 2 tablets by mouth daily for 2 days, then 1 tablet by mouth daily for 2 days, then 1/2 tablet by mouth daily for 2 days. Myles Gip, DO  Active   escitalopram (LEXAPRO) 20 MG tablet [id number] Yes Take 10 tablets by mouth daily. Chucky May, MD Taking Active Self           Med Note Tamala Julian, Delfino Lovett   Fri Jun 14, 2020 11:08 AM)    esomeprazole (NEXIUM) 20 MG capsule [id number] Yes Take 20 mg by mouth daily.  [provider] Taking Active Self  ferrous sulfate 325 (65 FE) MG tablet [id number] Yes Take 325 mg by mouth daily. [provider] Taking Active Self  LANTUS SOLOSTAR 100 UNIT/ML Solostar Pen [id number] Yes INJECT 25-50 UNITS INTO THE SKIN DAILY. Steele Sizer, MD Taking Active   loratadine (CLARITIN) 10 MG tablet [id number] Yes Take 10 mg by  mouth daily. [provider] Taking Active Self  Magnesium 250 MG TABS [id number] Yes Take 1 tablet by mouth daily. [provider] Taking Active   metFORMIN (GLUCOPHAGE-XR) 750 MG 24 hr tablet [id number] Yes Take 2 tablets (1,500 mg total) by mouth daily with breakfast. Steele Sizer, MD Taking Active   Semaglutide, 1 MG/DOSE, 4 MG/3ML SOPN [id number] Yes Inject 1 mg as directed once a week. Steele Sizer, MD Taking Active   zolpidem (AMBIEN) 10 MG tablet [id number] Yes Take 10 mg by mouth at bedtime. Chucky May, MD Taking Active              ASSESSMENT: Date  Discharged from Hospital: *** Date Medication Reconciliation Performed: 08/28/2021   Medications Discontinued at Discharge:  ***  ***  New Medications at Discharge:  *** ***  Medications with Dose Adjustments at Discharge: *** ***  Patient was recently discharged from hospital and all medications have been reviewed.  Drugs sorted by system:  Neurologic/Psychologic:  Cardiovascular:  Pulmonary/Allergy:  Gastrointestinal:  Endocrine:  Renal:  Topical:  Pain:  Infectious Diseases:  Oncology:  Genitourinary:  Vitamins/Minerals/Supplements:  Miscellaneous:  Medication Review Findings:  *** *** ***  Plan:

## 2021-08-29 NOTE — Telephone Encounter (Signed)
Spoke with pt and informed her that her prescription has been sent to the pharmacy and for her to schedule an appointment. She verbalized understanding and stated she will call on Monday due to celebrating her daughters birthday today

## 2021-10-03 ENCOUNTER — Other Ambulatory Visit: Payer: Self-pay | Admitting: Family Medicine

## 2021-10-03 DIAGNOSIS — E1169 Type 2 diabetes mellitus with other specified complication: Secondary | ICD-10-CM

## 2021-10-03 NOTE — Telephone Encounter (Signed)
Appt sch'd for 8.7.2023

## 2021-10-22 ENCOUNTER — Ambulatory Visit: Payer: Self-pay | Admitting: *Deleted

## 2021-10-22 ENCOUNTER — Other Ambulatory Visit: Payer: Self-pay | Admitting: Family Medicine

## 2021-10-22 ENCOUNTER — Telehealth (INDEPENDENT_AMBULATORY_CARE_PROVIDER_SITE_OTHER): Payer: Medicare Other | Admitting: Family Medicine

## 2021-10-22 ENCOUNTER — Encounter: Payer: Self-pay | Admitting: Family Medicine

## 2021-10-22 VITALS — Temp 99.3°F

## 2021-10-22 DIAGNOSIS — J069 Acute upper respiratory infection, unspecified: Secondary | ICD-10-CM

## 2021-10-22 DIAGNOSIS — U071 COVID-19: Secondary | ICD-10-CM

## 2021-10-22 MED ORDER — NIRMATRELVIR/RITONAVIR (PAXLOVID)TABLET: 3.0000 | ORAL_TABLET | Freq: Two times a day (BID) | ORAL | 0 refills | Status: AC

## 2021-10-22 NOTE — Progress Notes (Signed)
Name: Donna Haas   MRN: [id number]    DOB: 08/12/1975   Date:10/22/2021       Progress Note  Subjective:    Chief Complaint  Chief Complaint  Patient presents with   Covid Positive    Home test done today, pt started with headaches and feeling tired today. Pt unable to give vital signs besides temperature only 99.3. pt has relatives that also tested positive for COVID.    I connected with  Donna Haas  on 10/22/21 at  1:40 PM EDT by a video enabled telemedicine application and verified that I am speaking with the correct person using two identifiers.  I discussed the limitations of evaluation and management by telemedicine and the availability of in person appointments. The patient expressed understanding and agreed to proceed. Staff also discussed with the patient that there may be a patient responsible charge related to this service. Patient Location: home Provider Location[facility] Additional Individuals present: none  HPI COVID + sx onset this am, possibly some vague sx last night Multiple family members with COVID as well  Achy itchy eyes tired tickel in throat  Reviewed last kidney function labs Pt with IDDM, autoimmune disease, would like to discuss meds     Patient Active Problem List   Diagnosis Date Noted   Migraine without aura and responsive to treatment 11/20/2020   Cervical nerve root disorder 11/20/2020   Female infertility due to ovulation failure 11/20/2020   B12 deficiency 11/20/2020   Obesity (BMI 30-39.9)    Diabetes mellitus with hyperglycemia ([place]) 06/14/2020   Anxiety, generalized 12/13/2019   Binge eating disorder 08/23/2019   Mild intermittent asthma without complication 00/92/3300   Hypertriglyceridemia 02/07/2016   Morbid obesity ([place]) 01/25/2016   Barrett's esophagus determined by biopsy 01/21/2016   Carpal tunnel syndrome, bilateral 01/21/2016   Hx gestational diabetes 01/21/2016   Neck pain 09/08/2012   INSOMNIA 09/21/2008    NEPHROLITHIASIS, HX OF 04/17/2008   THYROID NODULE 01/12/2008   GERD 05/30/2007   ENDOMETRIOSIS OF OTHER SPECIFIED SITES 05/30/2007   Lupus ([place]) 05/30/2007   HERNIATED LUMBAR DISC 05/30/2007   BACK PAIN 05/30/2007    Social History   Tobacco Use   Smoking status: Never   Smokeless tobacco: Never  Substance Use Topics   Alcohol use: No    Alcohol/week: 1.0 - 2.0 standard drink of alcohol    Types: 1 - 2 Standard drinks or equivalent per week    Comment: discontinued use during pregnancy     Current Outpatient Medications:    ACCU-CHEK GUIDE test strip, USE UP TO 4 TIMES DAILY AS DIRECTED, Disp: , Rfl:    Accu-Chek Softclix Lancets lancets, SMARTSIG:Topical 1-4 Times Daily, Disp: , Rfl:    albuterol (VENTOLIN HFA) 108 (90 Base) MCG/ACT inhaler, Inhale 2 puffs into the lungs every 6 (six) hours as needed for wheezing or shortness of breath., Disp: 8 g, Rfl: 0   ALPRAZolam (XANAX) 1 MG tablet, Take 1 mg by mouth daily as needed., Disp: , Rfl:    BD PEN NEEDLE NANO 2ND GEN 32G X 4 MM MISC, 1 DOSE BY DOES NOT APPLY ROUTE 3 (THREE) TIMES DAILY BEFORE MEALS., Disp: , Rfl:    blood glucose meter kit and supplies, Dispense based on patient and insurance preference. Use up to four times daily as directed. (FOR ICD-10 E10.9, E11.9)., Disp: 1 each, Rfl: 0   Cyanocobalamin (B-12) 1000 MCG SUBL, Place under the tongue., Disp: , Rfl:  dexamethasone (DECADRON) 2 MG tablet, Take 2 tablets by mouth daily for 2 days, then 1 tablet by mouth daily for 2 days, then 1/2 tablet by mouth daily for 2 days., Disp: 7 tablet, Rfl: 0   escitalopram (LEXAPRO) 20 MG tablet, Take 10 tablets by mouth daily., Disp: , Rfl:    esomeprazole (NEXIUM) 20 MG capsule, Take 20 mg by mouth daily. , Disp: , Rfl:    ferrous sulfate 325 (65 FE) MG tablet, Take 325 mg by mouth daily., Disp: , Rfl:    insulin glargine (LANTUS SOLOSTAR) 100 UNIT/ML Solostar Pen, Inject 25-50 Units into the skin daily., Disp: 15 mL, Rfl: 0    loratadine (CLARITIN) 10 MG tablet, Take 10 mg by mouth daily., Disp: , Rfl:    Magnesium 250 MG TABS, Take 1 tablet by mouth daily., Disp: , Rfl:    metFORMIN (GLUCOPHAGE-XR) 750 MG 24 hr tablet, Take 2 tablets (1,500 mg total) by mouth daily with breakfast., Disp: 180 tablet, Rfl: 0   OZEMPIC, 1 MG/DOSE, 4 MG/3ML SOPN, INJECT 1 MG ONCE A WEEK AS DIRECTED, Disp: 9 mL, Rfl: 0   zolpidem (AMBIEN) 10 MG tablet, Take 10 mg by mouth at bedtime., Disp: , Rfl:   Allergies  Allergen Reactions   Compazine [Prochlorperazine] Anaphylaxis   Prednisone Anaphylaxis   Prochlorperazine Edisylate Anaphylaxis   Etodolac     Other reaction(s): Unknown   Farxiga [Dapagliflozin]     Constipation and headaches   Hydrocodone-Acetaminophen Nausea Only   Morphine And Related Nausea Only and Other (See Comments)    Mood swings, headache      Review of Systems  Constitutional: Negative.   HENT: Negative.    Eyes: Negative.   Respiratory: Negative.    Cardiovascular: Negative.   Gastrointestinal: Negative.   Endocrine: Negative.   Genitourinary: Negative.   Musculoskeletal: Negative.   Skin: Negative.   Allergic/Immunologic: Negative.   Neurological: Negative.   Hematological: Negative.   Psychiatric/Behavioral: Negative.    All other systems reviewed and are negative.     Objective:   Virtual encounter, vitals limited, only able to obtain the following Today's Vitals   10/22/21 1304  Temp: 99.3 F (37.4 C)  TempSrc: Oral   There is no height or weight on file to calculate BMI. Nursing Note and Vital Signs reviewed.  Physical Exam Vitals and nursing note reviewed.  Constitutional:      General: She is not in acute distress.    Appearance: Normal appearance. She is well-developed. She is not ill-appearing, toxic-appearing or diaphoretic.  HENT:     Head: Normocephalic and atraumatic.  Eyes:     General:        Right eye: No discharge.        Left eye: No discharge.      Conjunctiva/sclera: Conjunctivae normal.  Neck:     Trachea: No tracheal deviation.  Cardiovascular:     Rate and Rhythm: Normal rate.  Pulmonary:     Effort: Pulmonary effort is normal. No respiratory distress.     Breath sounds: No stridor.  Skin:    Coloration: Skin is not jaundiced or pale.     Findings: No rash.  Neurological:     Mental Status: She is alert.  Psychiatric:        Mood and Affect: Mood normal.        Behavior: Behavior normal.     PE limited by virtual encounter  No results found for this or any previous visit (  from the past 72 hour(s)).  Assessment and Plan:     ICD-10-CM   1. Upper respiratory tract infection due to COVID-19 virus  U07.1    J06.9    discussed supportive tx and paxlovid - renal function reviewed, paxlovid sent in, encouraged her to f/up if any worsening sx       -Red flags and when to present for emergency care or RTC including fever >101.23F, chest pain, shortness of breath, new/worsening/un-resolving symptoms, reviewed with patient at time of visit. Follow up and care instructions discussed and provided in AVS. - I discussed the assessment and treatment plan with the patient. The patient was provided an opportunity to ask questions and all were answered. The patient agreed with the plan and demonstrated an understanding of the instructions.  I provided 15 minutes of non-face-to-face time during this encounter.  Delsa Grana, PA-C 10/22/21 1:09 PM

## 2021-10-22 NOTE — Telephone Encounter (Signed)
Summary: covid prevention advice   Pts dad and daughter tested positive for covid and pt has Lupus and is concerned and asked to speak with Dr. Ancil Boozer about how she can prevent from getting it or if she did get it what she needs to do / please advise     Reason for Disposition  [1] HIGH RISK for severe COVID complications (e.g., weak immune system, age > 56 years, obesity with BMI > 25, pregnant, chronic lung disease or other chronic medical condition) AND [2] COVID symptoms (e.g., cough, fever)  (Exceptions: Already seen by PCP and no new or worsening symptoms.)  Answer Assessment - Initial Assessment Questions 1. COVID-19 EXPOSURE: "Please describe how you were exposed to someone with a COVID-19 infection."     Daughter is + COVID 2. PLACE of CONTACT: "Where were you when you were exposed to COVID-19?" (e.g., home, school, medical waiting room; which city?)     home 3. TYPE of CONTACT: "How much contact was there?" (e.g., sitting next to, live in same house, work in same office, same building)     home 4. DURATION of CONTACT: "How long were you in contact with the COVID-19 patient?" (e.g., a few seconds, passed by person, a few minutes, 15 minutes or longer, live with the patient)     *No Answer* 5. MASK: "Were you wearing a mask?" "Was the other person wearing a mask?" Note: wearing a mask reduces the risk of an otherwise close contact.     *No Answer* 6. DATE of CONTACT: "When did you have contact with a COVID-19 patient?" (e.g., how many days ago)     *No Answer* 7. COMMUNITY SPREAD: "Are there lots of cases of COVID-19 (community spread) where you live?" (See public health department website, if unsure)       *No Answer* 8. SYMPTOMS: "Do you have any symptoms?" (e.g., fever, cough, breathing difficulty, loss of taste or smell)     *No Answer* 9. VACCINE: "Have you gotten the COVID-19 vaccine?" If Yes, ask: "Which one, how many shots, when did you get it?"     *No Answer* 10. BOOSTER:  "Have you received your COVID-19 booster?" If Yes, ask: "Which one and when did you get it?"       *No Answer* 11. PREGNANCY OR POSTPARTUM: "Is there any chance you are pregnant?" "When was your last menstrual period?" "Did you deliver in the last 2 weeks?"       *No Answer* 12. HIGH RISK: "Do you have any heart or lung problems?" (e.g., asthma , COPD, heart failure) "Do you have a weak immune system or other risk factors?" (e.g., HIV positive, chemotherapy, renal failure, diabetes mellitus, sickle cell anemia, obesity)       *No Answer* 13. TRAVEL: "Have you traveled out of the country recently?" If Yes, ask: "When and where?"  Note: Travel becomes less relevant if there is widespread community transmission where the patient lives.       *No Answer*  Answer Assessment - Initial Assessment Questions 1. COVID-19 DIAGNOSIS: "Who made your COVID-19 diagnosis?" "Was it confirmed by a positive lab test or self-test?" If not diagnosed by a doctor (or NP/PA), ask "Are there lots of cases (community spread) where you live?" Note: See public health department website, if unsure.     + home test 2. COVID-19 EXPOSURE: "Was there any known exposure to COVID before the symptoms began?" CDC Definition of close contact: within 6 feet (2 meters) for a total of  15 minutes or more over a 24-hour period.      Yes- daughter/father 3. ONSET: "When did the COVID-19 symptoms start?"      today 4. WORST SYMPTOM: "What is your worst symptom?" (e.g., cough, fever, shortness of breath, muscle aches)     Fatigue, headache 5. COUGH: "Do you have a cough?" If Yes, ask: "How bad is the cough?"       no 6. FEVER: "Do you have a fever?" If Yes, ask: "What is your temperature, how was it measured, and when did it start?"     no 7. RESPIRATORY STATUS: "Describe your breathing?" (e.g., shortness of breath, wheezing, unable to speak)      normal 8. BETTER-SAME-WORSE: "Are you getting better, staying the same or getting worse  compared to yesterday?"  If getting worse, ask, "In what way?"     Worse- developing symptoms 9. HIGH RISK DISEASE: "Do you have any chronic medical problems?" (e.g., asthma, heart or lung disease, weak immune system, obesity, etc.)     Lupus, diabetes 10. VACCINE: "Have you had the COVID-19 vaccine?" If Yes, ask: "Which one, how many shots, when did you get it?"       yes 11. BOOSTER: "Have you received your COVID-19 booster?" If Yes, ask: "Which one and when did you get it?"       Yes- over 1 year 12. PREGNANCY: "Is there any chance you are pregnant?" "When was your last menstrual period?"       no 13. OTHER SYMPTOMS: "Do you have any other symptoms?"  (e.g., chills, fatigue, headache, loss of smell or taste, muscle pain, sore throat)       Fatigue, headache 14. O2 SATURATION MONITOR:  "Do you use an oxygen saturation monitor (pulse oximeter) at home?" If Yes, ask "What is your reading (oxygen level) today?" "What is your usual oxygen saturation reading?" (e.g., 95%)       no  Protocols used: Coronavirus (COVID-19) Exposure-A-AH, Coronavirus (COVID-19) Diagnosed or Suspected-A-AH

## 2021-10-22 NOTE — Telephone Encounter (Signed)
  Chief Complaint: + COVID Symptoms: fatigue, headache Frequency: symptoms started today after home exposure Pertinent Negatives: Patient denies fever, respiratory symptoms Disposition: '[]'$ ED /'[]'$ Urgent Care (no appt availability in office) / '[x]'$ Appointment(In office/virtual)/ '[]'$  [place]/ '[]'$ Home Care/ '[]'$ Refused Recommended Disposition /'[]'$ [place] [facility]/ '[]'$  Follow-up with PCP Additional Notes:

## 2021-11-14 NOTE — Progress Notes (Deleted)
Name: Donna Haas   MRN: [id number]    DOB: September 21, 1975   Date:11/14/2021       Progress Note  Subjective  Chief Complaint  Medication Refill  HPI  DMII: she was seen by Lona Millard NP in June, her A1C was at goal  At 6.6 %, she states she stopped by for A1C after her last visit but A1C not in our chart. Explained she needs to come in for A1C since it has been over 6 months. . FSBS has been between 120/130's, she is going back and forth on dose of insulin advised to only titrate every 3 days. She denies polyphagia, polydipsia or polyuria. She has tried statin therapy in the past but could not tolerate it , causes weakness/nausea and myopathy, she is willing to try Ezetimibe   Morbid obesity: she states ozempic is helping curb her appetite she thinks she is still losing weight about 5 lbs since Dec. Discussed considering SGL- 2 agonist if A1C remains below 7 %   Lupus: diagnosed years ago, no rashes at this time, feels achy and has joint aches. She used to go to [place] but not in years, Dr. Sanda Klein treated her with Plaquenil but it caused vision disturbances. She had an appointment with Dr. Posey Pronto in January 2023 but it was postponed because she was sick.   Thyroid nodule: used to see Dr. Richardson Landry and also lost to follow up, TSH within normal limits . She states Dr. Posey Pronto will check level and decide if she needs to see Endo   Back pain: she states having back problems and went to [facility] taking muscle relaxer- Zanaflex,  and could not drive it today.   Patient Active Problem List   Diagnosis Date Noted   Migraine without aura and responsive to treatment 11/20/2020   Cervical nerve root disorder 11/20/2020   Female infertility due to ovulation failure 11/20/2020   B12 deficiency 11/20/2020   Obesity (BMI 30-39.9)    Diabetes mellitus with hyperglycemia ([place]) 06/14/2020   Anxiety, generalized 12/13/2019   Binge eating disorder 08/23/2019   Mild intermittent asthma without complication  20/01/711   Hypertriglyceridemia 02/07/2016   Morbid obesity ([place]) 01/25/2016   Barrett's esophagus determined by biopsy 01/21/2016   Carpal tunnel syndrome, bilateral 01/21/2016   Hx gestational diabetes 01/21/2016   Neck pain 09/08/2012   INSOMNIA 09/21/2008   NEPHROLITHIASIS, HX OF 04/17/2008   THYROID NODULE 01/12/2008   GERD 05/30/2007   ENDOMETRIOSIS OF OTHER SPECIFIED SITES 05/30/2007   Lupus ([place]) 05/30/2007   HERNIATED LUMBAR [place] 05/30/2007   BACK PAIN 05/30/2007    Past Surgical History:  Procedure Laterality Date   APPENDECTOMY     CESAREAN SECTION     x2   CESAREAN SECTION WITH BILATERAL TUBAL LIGATION Bilateral 08/30/2015   Procedure: CESAREAN SECTION WITH BILATERAL TUBAL LIGATION;  Surgeon: Honor Loh Ward, MD;  Location: [facility] ORS;  Service: Obstetrics;  Laterality: Bilateral;   CHOLECYSTECTOMY     LAPAROSCOPY     x3   THYROID CYST EXCISION     Dr. Trena Platt, [place]    Family History  Problem Relation Age of Onset   Depression Mother        Bi-polar   Asthma Mother    Allergies Mother    Sleep apnea Mother    Allergies Sister    Diabetes Sister    Rheum arthritis Maternal Grandmother    Hypertension Paternal Grandmother    Macular degeneration Paternal Grandmother  Hyperlipidemia Other    Hypertension Other    Heart disease Other     Social History   Tobacco Use   Smoking status: Never   Smokeless tobacco: Never  Substance Use Topics   Alcohol use: No    Alcohol/week: 1.0 - 2.0 standard drink of alcohol    Types: 1 - 2 Standard drinks or equivalent per week    Comment: discontinued use during pregnancy     Current Outpatient Medications:    ACCU-CHEK GUIDE test strip, USE UP TO 4 TIMES DAILY AS DIRECTED, Disp: , Rfl:    Accu-Chek Softclix Lancets lancets, SMARTSIG:Topical 1-4 Times Daily, Disp: , Rfl:    albuterol (VENTOLIN HFA) 108 (90 Base) MCG/ACT inhaler, Inhale 2 puffs into the lungs every 6 (six) hours as needed for wheezing or  shortness of breath., Disp: 8 g, Rfl: 0   ALPRAZolam (XANAX) 1 MG tablet, Take 1 mg by mouth daily as needed., Disp: , Rfl:    BD PEN NEEDLE NANO 2ND GEN 32G X 4 MM MISC, 1 DOSE BY DOES NOT APPLY ROUTE 3 (THREE) TIMES DAILY BEFORE MEALS., Disp: , Rfl:    blood glucose meter kit and supplies, Dispense based on patient and insurance preference. Use up to four times daily as directed. (FOR ICD-10 E10.9, E11.9)., Disp: 1 each, Rfl: 0   Cyanocobalamin (B-12) 1000 MCG SUBL, Place under the tongue., Disp: , Rfl:    dexamethasone (DECADRON) 2 MG tablet, Take 2 tablets by mouth daily for 2 days, then 1 tablet by mouth daily for 2 days, then 1/2 tablet by mouth daily for 2 days., Disp: 7 tablet, Rfl: 0   escitalopram (LEXAPRO) 20 MG tablet, Take 10 tablets by mouth daily., Disp: , Rfl:    esomeprazole (NEXIUM) 20 MG capsule, Take 20 mg by mouth daily. , Disp: , Rfl:    ferrous sulfate 325 (65 FE) MG tablet, Take 325 mg by mouth daily., Disp: , Rfl:    LANTUS SOLOSTAR 100 UNIT/ML Solostar Pen, INJECT 25-50 UNITS INTO THE SKIN DAILY., Disp: 15 mL, Rfl: 0   loratadine (CLARITIN) 10 MG tablet, Take 10 mg by mouth daily., Disp: , Rfl:    Magnesium 250 MG TABS, Take 1 tablet by mouth daily., Disp: , Rfl:    metFORMIN (GLUCOPHAGE-XR) 750 MG 24 hr tablet, Take 2 tablets (1,500 mg total) by mouth daily with breakfast., Disp: 180 tablet, Rfl: 0   OZEMPIC, 1 MG/DOSE, 4 MG/3ML SOPN, INJECT 1 MG ONCE A WEEK AS DIRECTED, Disp: 9 mL, Rfl: 0   zolpidem (AMBIEN) 10 MG tablet, Take 10 mg by mouth at bedtime., Disp: , Rfl:   Allergies  Allergen Reactions   Compazine [Prochlorperazine] Anaphylaxis   Prednisone Anaphylaxis   Prochlorperazine Edisylate Anaphylaxis   Etodolac     Other reaction(s): Unknown   Farxiga [Dapagliflozin]     Constipation and headaches   Hydrocodone-Acetaminophen Nausea Only   Morphine And Related Nausea Only and Other (See Comments)    Mood swings, headache    I personally reviewed active  problem list, medication list, allergies, family history, social history, health maintenance with the patient/caregiver today.   ROS  ***  Objective  There were no vitals filed for this visit.  There is no height or weight on file to calculate BMI.  Physical Exam ***  No results found for this or any previous visit (from the past 2160 hour(s)).  Diabetic Foot Exam: Diabetic Foot Exam - Simple   No data filed    ***  PHQ2/9:    10/22/2021    1:03 PM 07/09/2021    8:07 AM 06/23/2021    1:06 PM 03/25/2021    2:09 PM 01/28/2021    8:15 AM  Depression screen PHQ 2/9  Decreased Interest 0 0 0 0 0  Down, Depressed, Hopeless 0 0 0 0 0  PHQ - 2 Score 0 0 0 0 0  Altered sleeping 0 0 0 0 0  Tired, decreased energy 0 0 0 0 0  Change in appetite 0 0 0 0 0  Feeling bad or failure about yourself  0 0 0 0 0  Trouble concentrating 0 0 0 0 0  Moving slowly or fidgety/restless 0 0 0 0 0  Suicidal thoughts 0 0 0 0 0  PHQ-9 Score 0 0 0 0 0  Difficult doing work/chores Not difficult at all Not difficult at all   Not difficult at all    phq 9 is {gen pos [id number]}   Fall Risk:    10/22/2021    1:03 PM 07/09/2021    8:07 AM 06/23/2021    1:06 PM 03/25/2021    2:08 PM 01/28/2021    8:15 AM  Fall Risk   Falls in the past year? 1 0 0 0 0  Number falls in past yr: 0 0 0 0 0  Injury with Fall? 0 0 0 0 0  Risk for fall due to : Impaired balance/gait No Fall Risks No Fall Risks No Fall Risks No Fall Risks  Follow up Falls prevention discussed;Education provided Falls prevention discussed Falls prevention discussed Falls prevention discussed Falls prevention discussed      Functional Status Survey:      Assessment & Plan  *** There are no diagnoses linked to this encounter.

## 2021-11-17 ENCOUNTER — Ambulatory Visit: Payer: Medicare Other | Admitting: Family Medicine

## 2021-11-17 DIAGNOSIS — E1165 Type 2 diabetes mellitus with hyperglycemia: Secondary | ICD-10-CM

## 2021-11-19 LAB — HM MAMMOGRAPHY

## 2021-11-30 ENCOUNTER — Other Ambulatory Visit: Payer: Self-pay | Admitting: Family Medicine

## 2021-12-04 NOTE — Progress Notes (Deleted)
Name: Donna Haas   MRN: [id number]    DOB: 06/10/1975   Date:12/04/2021       Progress Note  Subjective  Chief Complaint  Medication Refill  HPI  DMII: she was seen by Lona Millard NP in June, her A1C was at goal  At 6.6 %, she states she stopped by for A1C after her last visit but A1C not in our chart. Explained she needs to come in for A1C since it has been over 6 months. . FSBS has been between 120/130's, she is going back and forth on dose of insulin advised to only titrate every 3 days. She denies polyphagia, polydipsia or polyuria. She has tried statin therapy in the past but could not tolerate it , causes weakness/nausea and myopathy, she is willing to try Ezetimibe   Morbid obesity: she states ozempic is helping curb her appetite she thinks she is still losing weight about 5 lbs since Dec. Discussed considering SGL- 2 agonist if A1C remains below 7 %   Lupus: diagnosed years ago, no rashes at this time, feels achy and has joint aches. She used to go to [place] but not in years, Dr. Sanda Klein treated her with Plaquenil but it caused vision disturbances. She had an appointment with Dr. Posey Pronto in January 2023 but it was postponed because she was sick.   Thyroid nodule: used to see Dr. Richardson Landry and also lost to follow up, TSH within normal limits . She states Dr. Posey Pronto will check level and decide if she needs to see Endo   Back pain: she states having back problems and went to [facility] taking muscle relaxer- Zanaflex,  and could not drive it today.   Patient Active Problem List   Diagnosis Date Noted   Migraine without aura and responsive to treatment 11/20/2020   Cervical nerve root disorder 11/20/2020   Female infertility due to ovulation failure 11/20/2020   B12 deficiency 11/20/2020   Obesity (BMI 30-39.9)    Diabetes mellitus with hyperglycemia ([place]) 06/14/2020   Anxiety, generalized 12/13/2019   Binge eating disorder 08/23/2019   Mild intermittent asthma without  complication 78/67/6720   Hypertriglyceridemia 02/07/2016   Morbid obesity ([place]) 01/25/2016   Barrett's esophagus determined by biopsy 01/21/2016   Carpal tunnel syndrome, bilateral 01/21/2016   Hx gestational diabetes 01/21/2016   Neck pain 09/08/2012   INSOMNIA 09/21/2008   NEPHROLITHIASIS, HX OF 04/17/2008   THYROID NODULE 01/12/2008   GERD 05/30/2007   ENDOMETRIOSIS OF OTHER SPECIFIED SITES 05/30/2007   Lupus ([place]) 05/30/2007   HERNIATED LUMBAR [place] 05/30/2007   BACK PAIN 05/30/2007    Past Surgical History:  Procedure Laterality Date   APPENDECTOMY     CESAREAN SECTION     x2   CESAREAN SECTION WITH BILATERAL TUBAL LIGATION Bilateral 08/30/2015   Procedure: CESAREAN SECTION WITH BILATERAL TUBAL LIGATION;  Surgeon: Honor Loh Ward, MD;  Location: [facility] ORS;  Service: Obstetrics;  Laterality: Bilateral;   CHOLECYSTECTOMY     LAPAROSCOPY     x3   THYROID CYST EXCISION     Dr. Trena Platt,     Family History  Problem Relation Age of Onset   Depression Mother        Bi-polar   Asthma Mother    Allergies Mother    Sleep apnea Mother    Allergies Sister    Diabetes Sister    Rheum arthritis Maternal Grandmother    Hypertension Paternal Grandmother    Macular degeneration Paternal Grandmother  Hyperlipidemia Other    Hypertension Other    Heart disease Other     Social History   Tobacco Use   Smoking status: Never   Smokeless tobacco: Never  Substance Use Topics   Alcohol use: No    Alcohol/week: 1.0 - 2.0 standard drink of alcohol    Types: 1 - 2 Standard drinks or equivalent per week    Comment: discontinued use during pregnancy     Current Outpatient Medications:    ACCU-CHEK GUIDE test strip, USE UP TO 4 TIMES DAILY AS DIRECTED, Disp: , Rfl:    Accu-Chek Softclix Lancets lancets, SMARTSIG:Topical 1-4 Times Daily, Disp: , Rfl:    albuterol (VENTOLIN HFA) 108 (90 Base) MCG/ACT inhaler, Inhale 2 puffs into the lungs every 6 (six) hours as needed for  wheezing or shortness of breath., Disp: 8 g, Rfl: 0   ALPRAZolam (XANAX) 1 MG tablet, Take 1 mg by mouth daily as needed., Disp: , Rfl:    BD PEN NEEDLE NANO 2ND GEN 32G X 4 MM MISC, 1 DOSE BY DOES NOT APPLY ROUTE 3 (THREE) TIMES DAILY BEFORE MEALS., Disp: , Rfl:    blood glucose meter kit and supplies, Dispense based on patient and insurance preference. Use up to four times daily as directed. (FOR ICD-10 E10.9, E11.9)., Disp: 1 each, Rfl: 0   Cyanocobalamin (B-12) 1000 MCG SUBL, Place under the tongue., Disp: , Rfl:    dexamethasone (DECADRON) 2 MG tablet, Take 2 tablets by mouth daily for 2 days, then 1 tablet by mouth daily for 2 days, then 1/2 tablet by mouth daily for 2 days., Disp: 7 tablet, Rfl: 0   escitalopram (LEXAPRO) 20 MG tablet, Take 10 tablets by mouth daily., Disp: , Rfl:    esomeprazole (NEXIUM) 20 MG capsule, Take 20 mg by mouth daily. , Disp: , Rfl:    ferrous sulfate 325 (65 FE) MG tablet, Take 325 mg by mouth daily., Disp: , Rfl:    LANTUS SOLOSTAR 100 UNIT/ML Solostar Pen, INJECT 25-50 UNITS INTO THE SKIN DAILY., Disp: 15 mL, Rfl: 0   loratadine (CLARITIN) 10 MG tablet, Take 10 mg by mouth daily., Disp: , Rfl:    Magnesium 250 MG TABS, Take 1 tablet by mouth daily., Disp: , Rfl:    metFORMIN (GLUCOPHAGE-XR) 750 MG 24 hr tablet, Take 2 tablets (1,500 mg total) by mouth daily with breakfast., Disp: 180 tablet, Rfl: 0   OZEMPIC, 1 MG/DOSE, 4 MG/3ML SOPN, INJECT 1 MG ONCE A WEEK AS DIRECTED, Disp: 9 mL, Rfl: 0   zolpidem (AMBIEN) 10 MG tablet, Take 10 mg by mouth at bedtime., Disp: , Rfl:   Allergies  Allergen Reactions   Compazine [Prochlorperazine] Anaphylaxis   Prednisone Anaphylaxis   Prochlorperazine Edisylate Anaphylaxis   Etodolac     Other reaction(s): Unknown   Farxiga [Dapagliflozin]     Constipation and headaches   Hydrocodone-Acetaminophen Nausea Only   Morphine And Related Nausea Only and Other (See Comments)    Mood swings, headache    I personally  reviewed active problem list, medication list, allergies, family history, social history, health maintenance with the patient/caregiver today.   ROS  ***  Objective  There were no vitals filed for this visit.  There is no height or weight on file to calculate BMI.  Physical Exam ***  No results found for this or any previous visit (from the past 2160 hour(s)).  Diabetic Foot Exam: Diabetic Foot Exam - Simple   No data filed    ***  PHQ2/9:    10/22/2021    1:03 PM 07/09/2021    8:07 AM 06/23/2021    1:06 PM 03/25/2021    2:09 PM 01/28/2021    8:15 AM  Depression screen PHQ 2/9  Decreased Interest 0 0 0 0 0  Down, Depressed, Hopeless 0 0 0 0 0  PHQ - 2 Score 0 0 0 0 0  Altered sleeping 0 0 0 0 0  Tired, decreased energy 0 0 0 0 0  Change in appetite 0 0 0 0 0  Feeling bad or failure about yourself  0 0 0 0 0  Trouble concentrating 0 0 0 0 0  Moving slowly or fidgety/restless 0 0 0 0 0  Suicidal thoughts 0 0 0 0 0  PHQ-9 Score 0 0 0 0 0  Difficult doing work/chores Not difficult at all Not difficult at all   Not difficult at all    phq 9 is {gen pos [id number]}   Fall Risk:    10/22/2021    1:03 PM 07/09/2021    8:07 AM 06/23/2021    1:06 PM 03/25/2021    2:08 PM 01/28/2021    8:15 AM  Fall Risk   Falls in the past year? 1 0 0 0 0  Number falls in past yr: 0 0 0 0 0  Injury with Fall? 0 0 0 0 0  Risk for fall due to : Impaired balance/gait No Fall Risks No Fall Risks No Fall Risks No Fall Risks  Follow up Falls prevention discussed;Education provided Falls prevention discussed Falls prevention discussed Falls prevention discussed Falls prevention discussed      Functional Status Survey:      Assessment & Plan  *** There are no diagnoses linked to this encounter.

## 2021-12-05 ENCOUNTER — Ambulatory Visit: Payer: Medicare Other | Admitting: Family Medicine

## 2021-12-05 DIAGNOSIS — E1165 Type 2 diabetes mellitus with hyperglycemia: Secondary | ICD-10-CM

## 2021-12-08 NOTE — Progress Notes (Deleted)
Name: Donna Haas   MRN: [id number]    DOB: Jul 09, 1975   Date:12/08/2021       Progress Note  Subjective  Chief Complaint  Medication Refill  HPI  DMII: she was seen by Lona Millard NP in June, her A1C was at goal  At 6.6 %, she states she stopped by for A1C after her last visit but A1C not in our chart. Explained she needs to come in for A1C since it has been over 6 months. . FSBS has been between 120/130's, she is going back and forth on dose of insulin advised to only titrate every 3 days. She denies polyphagia, polydipsia or polyuria. She has tried statin therapy in the past but could not tolerate it , causes weakness/nausea and myopathy, she is willing to try Ezetimibe   Morbid obesity: she states ozempic is helping curb her appetite she thinks she is still losing weight about 5 lbs since Dec. Discussed considering SGL- 2 agonist if A1C remains below 7 %   Lupus: diagnosed years ago, no rashes at this time, feels achy and has joint aches. She used to go to [place] but not in years, Dr. Sanda Klein treated her with Plaquenil but it caused vision disturbances. She had an appointment with Dr. Posey Pronto in January 2023 but it was postponed because she was sick.   Thyroid nodule: used to see Dr. Richardson Landry and also lost to follow up, TSH within normal limits . She states Dr. Posey Pronto will check level and decide if she needs to see Endo   Back pain: she states having back problems and went to [facility] taking muscle relaxer- Zanaflex,  and could not drive it today.   Patient Active Problem List   Diagnosis Date Noted   Migraine without aura and responsive to treatment 11/20/2020   Cervical nerve root disorder 11/20/2020   Female infertility due to ovulation failure 11/20/2020   B12 deficiency 11/20/2020   Obesity (BMI 30-39.9)    Diabetes mellitus with hyperglycemia ([place]) 06/14/2020   Anxiety, generalized 12/13/2019   Binge eating disorder 08/23/2019   Mild intermittent asthma without  complication 44/06/4740   Hypertriglyceridemia 02/07/2016   Morbid obesity ([place]) 01/25/2016   Barrett's esophagus determined by biopsy 01/21/2016   Carpal tunnel syndrome, bilateral 01/21/2016   Hx gestational diabetes 01/21/2016   Neck pain 09/08/2012   INSOMNIA 09/21/2008   NEPHROLITHIASIS, HX OF 04/17/2008   THYROID NODULE 01/12/2008   GERD 05/30/2007   ENDOMETRIOSIS OF OTHER SPECIFIED SITES 05/30/2007   Lupus ([place]) 05/30/2007   HERNIATED LUMBAR [place] 05/30/2007   BACK PAIN 05/30/2007    Past Surgical History:  Procedure Laterality Date   APPENDECTOMY     CESAREAN SECTION     x2   CESAREAN SECTION WITH BILATERAL TUBAL LIGATION Bilateral 08/30/2015   Procedure: CESAREAN SECTION WITH BILATERAL TUBAL LIGATION;  Surgeon: Honor Loh Ward, MD;  Location: [facility] ORS;  Service: Obstetrics;  Laterality: Bilateral;   CHOLECYSTECTOMY     LAPAROSCOPY     x3   THYROID CYST EXCISION     Dr. Trena Platt, [employer]    Family History  Problem Relation Age of Onset   Depression Mother        Bi-polar   Asthma Mother    Allergies Mother    Sleep apnea Mother    Allergies Sister    Diabetes Sister    Rheum arthritis Maternal Grandmother    Hypertension Paternal Grandmother    Macular degeneration Paternal Grandmother  Hyperlipidemia Other    Hypertension Other    Heart disease Other     Social History   Tobacco Use   Smoking status: Never   Smokeless tobacco: Never  Substance Use Topics   Alcohol use: No    Alcohol/week: 1.0 - 2.0 standard drink of alcohol    Types: 1 - 2 Standard drinks or equivalent per week    Comment: discontinued use during pregnancy     Current Outpatient Medications:    ACCU-CHEK GUIDE test strip, USE UP TO 4 TIMES DAILY AS DIRECTED, Disp: , Rfl:    Accu-Chek Softclix Lancets lancets, SMARTSIG:Topical 1-4 Times Daily, Disp: , Rfl:    albuterol (VENTOLIN HFA) 108 (90 Base) MCG/ACT inhaler, Inhale 2 puffs into the lungs every 6 (six) hours as needed for  wheezing or shortness of breath., Disp: 8 g, Rfl: 0   ALPRAZolam (XANAX) 1 MG tablet, Take 1 mg by mouth daily as needed., Disp: , Rfl:    BD PEN NEEDLE NANO 2ND GEN 32G X 4 MM MISC, 1 DOSE BY DOES NOT APPLY ROUTE 3 (THREE) TIMES DAILY BEFORE MEALS., Disp: , Rfl:    blood glucose meter kit and supplies, Dispense based on patient and insurance preference. Use up to four times daily as directed. (FOR ICD-10 E10.9, E11.9)., Disp: 1 each, Rfl: 0   Cyanocobalamin (B-12) 1000 MCG SUBL, Place under the tongue., Disp: , Rfl:    dexamethasone (DECADRON) 2 MG tablet, Take 2 tablets by mouth daily for 2 days, then 1 tablet by mouth daily for 2 days, then 1/2 tablet by mouth daily for 2 days., Disp: 7 tablet, Rfl: 0   escitalopram (LEXAPRO) 20 MG tablet, Take 10 tablets by mouth daily., Disp: , Rfl:    esomeprazole (NEXIUM) 20 MG capsule, Take 20 mg by mouth daily. , Disp: , Rfl:    ferrous sulfate 325 (65 FE) MG tablet, Take 325 mg by mouth daily., Disp: , Rfl:    LANTUS SOLOSTAR 100 UNIT/ML Solostar Pen, INJECT 25-50 UNITS INTO THE SKIN DAILY., Disp: 15 mL, Rfl: 0   loratadine (CLARITIN) 10 MG tablet, Take 10 mg by mouth daily., Disp: , Rfl:    Magnesium 250 MG TABS, Take 1 tablet by mouth daily., Disp: , Rfl:    metFORMIN (GLUCOPHAGE-XR) 750 MG 24 hr tablet, Take 2 tablets (1,500 mg total) by mouth daily with breakfast., Disp: 180 tablet, Rfl: 0   OZEMPIC, 1 MG/DOSE, 4 MG/3ML SOPN, INJECT 1 MG ONCE A WEEK AS DIRECTED, Disp: 9 mL, Rfl: 0   zolpidem (AMBIEN) 10 MG tablet, Take 10 mg by mouth at bedtime., Disp: , Rfl:   Allergies  Allergen Reactions   Compazine [Prochlorperazine] Anaphylaxis   Prednisone Anaphylaxis   Prochlorperazine Edisylate Anaphylaxis   Etodolac     Other reaction(s): Unknown   Farxiga [Dapagliflozin]     Constipation and headaches   Hydrocodone-Acetaminophen Nausea Only   Morphine And Related Nausea Only and Other (See Comments)    Mood swings, headache    I personally  reviewed active problem list, medication list, allergies, family history, social history, health maintenance with the patient/caregiver today.   ROS  ***  Objective  There were no vitals filed for this visit.  There is no height or weight on file to calculate BMI.  Physical Exam ***  Recent Results (from the past 2160 hour(s))  HM MAMMOGRAPHY     Status: None   Collection Time: 11/19/21 12:00 AM  Result Value Ref Range  HM Mammogram 0-4 Bi-Rad 0-4 Bi-Rad, Self Reported Normal    Diabetic Foot Exam: Diabetic Foot Exam - Simple   No data filed    ***  PHQ2/9:    10/22/2021    1:03 PM 07/09/2021    8:07 AM 06/23/2021    1:06 PM 03/25/2021    2:09 PM 01/28/2021    8:15 AM  Depression screen PHQ 2/9  Decreased Interest 0 0 0 0 0  Down, Depressed, Hopeless 0 0 0 0 0  PHQ - 2 Score 0 0 0 0 0  Altered sleeping 0 0 0 0 0  Tired, decreased energy 0 0 0 0 0  Change in appetite 0 0 0 0 0  Feeling bad or failure about yourself  0 0 0 0 0  Trouble concentrating 0 0 0 0 0  Moving slowly or fidgety/restless 0 0 0 0 0  Suicidal thoughts 0 0 0 0 0  PHQ-9 Score 0 0 0 0 0  Difficult doing work/chores Not difficult at all Not difficult at all   Not difficult at all    phq 9 is {gen pos [id number]}   Fall Risk:    10/22/2021    1:03 PM 07/09/2021    8:07 AM 06/23/2021    1:06 PM 03/25/2021    2:08 PM 01/28/2021    8:15 AM  Fall Risk   Falls in the past year? 1 0 0 0 0  Number falls in past yr: 0 0 0 0 0  Injury with Fall? 0 0 0 0 0  Risk for fall due to : Impaired balance/gait No Fall Risks No Fall Risks No Fall Risks No Fall Risks  Follow up Falls prevention discussed;Education provided Falls prevention discussed Falls prevention discussed Falls prevention discussed Falls prevention discussed      Functional Status Survey:      Assessment & Plan  *** There are no diagnoses linked to this encounter.

## 2021-12-09 ENCOUNTER — Ambulatory Visit: Payer: Medicare Other | Admitting: Family Medicine

## 2021-12-09 DIAGNOSIS — E1165 Type 2 diabetes mellitus with hyperglycemia: Secondary | ICD-10-CM

## 2021-12-29 NOTE — Progress Notes (Deleted)
Name: Donna Haas   MRN: [id number]    DOB: [birth year]   Date:12/29/2021       Progress Note  Subjective  Chief Complaint  Medication Refill  HPI  DMII: In June her A1C was at goal at 6.6 %. FSBS has been between 120/130's, she is going back and forth on dose of insulin advised to only titrate every 3 days. She denies polyphagia, polydipsia or polyuria. She has tried statin therapy in the past but could not tolerate it , causes weakness/nausea and myopathy, she is willing to try Ezetimibe   Morbid obesity: she states ozempic is helping curb her appetite she thinks she is still losing weight about 5 lbs since Dec. Discussed considering SGL- 2 agonist if A1C remains below 7 %   Lupus: diagnosed years ago, no rashes at this time, feels achy and has joint aches. [place] to go to [place] but not in years, Dr. Sanda Klein treated her with Plaquenil but it caused vision disturbances. She had an appointment with Dr. Posey Pronto in January 2023 but it was postponed because she was sick.   Thyroid nodule: used to see Dr. Richardson Landry and also lost to follow up, TSH within normal limits . She states Dr. Posey Pronto will check level and decide if she needs to see Endo   Back pain: she states having back problems and went to [facility] taking muscle relaxer- Zanaflex,  and could not drive it today.   Patient Active Problem List   Diagnosis Date Noted   Migraine without aura and responsive to treatment 11/20/2020   Cervical nerve root disorder 11/20/2020   Female infertility due to ovulation failure 11/20/2020   B12 deficiency 11/20/2020   Obesity (BMI 30-39.9)    Diabetes mellitus with hyperglycemia ([place]) 06/14/2020   Anxiety, generalized 12/13/2019   Binge eating disorder 08/23/2019   Mild intermittent asthma without complication 78/93/8101   Hypertriglyceridemia 02/07/2016   Morbid obesity ([place]) 01/25/2016   Barrett's esophagus determined by biopsy 01/21/2016   Carpal tunnel syndrome, bilateral 01/21/2016   Hx  gestational diabetes 01/21/2016   Neck pain 09/08/2012   INSOMNIA 09/21/2008   NEPHROLITHIASIS, HX OF 04/17/2008   THYROID NODULE 01/12/2008   GERD 05/30/2007   ENDOMETRIOSIS OF OTHER SPECIFIED SITES 05/30/2007   Lupus ([place]) 05/30/2007   HERNIATED LUMBAR [place] 05/30/2007   BACK PAIN 05/30/2007    Past Surgical History:  Procedure Laterality Date   APPENDECTOMY     CESAREAN SECTION     x2   CESAREAN SECTION WITH BILATERAL TUBAL LIGATION Bilateral 08/30/2015   Procedure: CESAREAN SECTION WITH BILATERAL TUBAL LIGATION;  Surgeon: Honor Loh Ward, MD;  Location: [facility] ORS;  Service: Obstetrics;  Laterality: Bilateral;   CHOLECYSTECTOMY     LAPAROSCOPY     x3   THYROID CYST EXCISION     Dr. Trena Platt, [place]    Family History  Problem Relation Age of Onset   Depression Mother        Bi-polar   Asthma Mother    Allergies Mother    Sleep apnea Mother    Allergies Sister    Diabetes Sister    Rheum arthritis Maternal Grandmother    Hypertension Paternal Grandmother    Macular degeneration Paternal Grandmother    Hyperlipidemia Other    Hypertension Other    Heart disease Other     Social History   Tobacco Use   Smoking status: Never   Smokeless tobacco: Never  Substance Use Topics   Alcohol  use: No    Alcohol/week: 1.0 - 2.0 standard drink of alcohol    Types: 1 - 2 Standard drinks or equivalent per week    Comment: discontinued use during pregnancy     Current Outpatient Medications:    ACCU-CHEK GUIDE test strip, USE UP TO 4 TIMES DAILY AS DIRECTED, Disp: , Rfl:    Accu-Chek Softclix Lancets lancets, SMARTSIG:Topical 1-4 Times Daily, Disp: , Rfl:    albuterol (VENTOLIN HFA) 108 (90 Base) MCG/ACT inhaler, Inhale 2 puffs into the lungs every 6 (six) hours as needed for wheezing or shortness of breath., Disp: 8 g, Rfl: 0   ALPRAZolam (XANAX) 1 MG tablet, Take 1 mg by mouth daily as needed., Disp: , Rfl:    BD PEN NEEDLE NANO 2ND GEN 32G X 4 MM MISC, 1 DOSE BY DOES NOT  APPLY ROUTE 3 (THREE) TIMES DAILY BEFORE MEALS., Disp: , Rfl:    blood glucose meter kit and supplies, Dispense based on patient and insurance preference. Use up to four times daily as directed. (FOR ICD-10 E10.9, E11.9)., Disp: 1 each, Rfl: 0   Cyanocobalamin (B-12) 1000 MCG SUBL, Place under the tongue., Disp: , Rfl:    dexamethasone (DECADRON) 2 MG tablet, Take 2 tablets by mouth daily for 2 days, then 1 tablet by mouth daily for 2 days, then 1/2 tablet by mouth daily for 2 days., Disp: 7 tablet, Rfl: 0   escitalopram (LEXAPRO) 20 MG tablet, Take 10 tablets by mouth daily., Disp: , Rfl:    esomeprazole (NEXIUM) 20 MG capsule, Take 20 mg by mouth daily. , Disp: , Rfl:    ferrous sulfate 325 (65 FE) MG tablet, Take 325 mg by mouth daily., Disp: , Rfl:    LANTUS SOLOSTAR 100 UNIT/ML Solostar Pen, INJECT 25-50 UNITS INTO THE SKIN DAILY., Disp: 15 mL, Rfl: 0   loratadine (CLARITIN) 10 MG tablet, Take 10 mg by mouth daily., Disp: , Rfl:    Magnesium 250 MG TABS, Take 1 tablet by mouth daily., Disp: , Rfl:    metFORMIN (GLUCOPHAGE-XR) 750 MG 24 hr tablet, Take 2 tablets (1,500 mg total) by mouth daily with breakfast., Disp: 180 tablet, Rfl: 0   OZEMPIC, 1 MG/DOSE, 4 MG/3ML SOPN, INJECT 1 MG ONCE A WEEK AS DIRECTED, Disp: 9 mL, Rfl: 0   zolpidem (AMBIEN) 10 MG tablet, Take 10 mg by mouth at bedtime., Disp: , Rfl:   Allergies  Allergen Reactions   Compazine [Prochlorperazine] Anaphylaxis   Prednisone Anaphylaxis   Prochlorperazine Edisylate Anaphylaxis   Etodolac     Other reaction(s): Unknown   Farxiga [Dapagliflozin]     Constipation and headaches   Hydrocodone-Acetaminophen Nausea Only   Morphine And Related Nausea Only and Other (See Comments)    Mood swings, headache    I personally reviewed active problem list, medication list, allergies, family history, social history, health maintenance with the patient/caregiver today.   ROS  ***  Objective  There were no vitals filed for this  visit.  There is no height or weight on file to calculate BMI.  Physical Exam ***  Recent Results (from the past 2160 hour(s))  HM MAMMOGRAPHY     Status: None   Collection Time: 11/19/21 12:00 AM  Result Value Ref Range   HM Mammogram 0-4 Bi-Rad 0-4 Bi-Rad, Self Reported Normal    Diabetic Foot Exam: Diabetic Foot Exam - Simple   No data filed    ***  PHQ2/9:    10/22/2021  1:03 PM 07/09/2021    8:07 AM 06/23/2021    1:06 PM 03/25/2021    2:09 PM 01/28/2021    8:15 AM  Depression screen PHQ 2/9  Decreased Interest 0 0 0 0 0  Down, Depressed, Hopeless 0 0 0 0 0  PHQ - 2 Score 0 0 0 0 0  Altered sleeping 0 0 0 0 0  Tired, decreased energy 0 0 0 0 0  Change in appetite 0 0 0 0 0  Feeling bad or failure about yourself  0 0 0 0 0  Trouble concentrating 0 0 0 0 0  Moving slowly or fidgety/restless 0 0 0 0 0  Suicidal thoughts 0 0 0 0 0  PHQ-9 Score 0 0 0 0 0  Difficult doing work/chores Not difficult at all Not difficult at all   Not difficult at all    phq 9 is {gen pos [id number]}   Fall Risk:    10/22/2021    1:03 PM 07/09/2021    8:07 AM 06/23/2021    1:06 PM 03/25/2021    2:08 PM 01/28/2021    8:15 AM  Fall Risk   Falls in the past year? 1 0 0 0 0  Number falls in past yr: 0 0 0 0 0  Injury with Fall? 0 0 0 0 0  Risk for fall due to : Impaired balance/gait No Fall Risks No Fall Risks No Fall Risks No Fall Risks  Follow up Falls prevention discussed;Education provided Falls prevention discussed Falls prevention discussed Falls prevention discussed Falls prevention discussed      Functional Status Survey:      Assessment & Plan  *** There are no diagnoses linked to this encounter.

## 2021-12-30 ENCOUNTER — Ambulatory Visit: Payer: Medicare Other | Admitting: Family Medicine

## 2021-12-30 DIAGNOSIS — E1165 Type 2 diabetes mellitus with hyperglycemia: Secondary | ICD-10-CM

## 2022-01-08 ENCOUNTER — Other Ambulatory Visit: Payer: Self-pay | Admitting: Family Medicine

## 2022-01-08 DIAGNOSIS — E1169 Type 2 diabetes mellitus with other specified complication: Secondary | ICD-10-CM

## 2022-01-13 NOTE — Progress Notes (Deleted)
Name: Donna Haas   MRN: [id number]    DOB: Jul 28, 1975   Date:01/13/2022       Progress Note  Subjective  Chief Complaint  Medication Refill  HPI  DMII: In June her A1C was at goal at 6.6 %. FSBS has been between 120/130's, she is going back and forth on dose of insulin advised to only titrate every 3 days. She denies polyphagia, polydipsia or polyuria. She has tried statin therapy in the past but could not tolerate it , causes weakness/nausea and myopathy, she is willing to try Ezetimibe   Morbid obesity: she states ozempic is helping curb her appetite she thinks she is still losing weight about 5 lbs since Dec. Discussed considering SGL- 2 agonist if A1C remains below 7 %   Lupus: diagnosed years ago, no rashes at this time, feels achy and has joint aches. She used to go to [place] but not in years, Dr. Sanda Klein treated her with Plaquenil but it caused vision disturbances. She had an appointment with Dr. Posey Pronto in January 2023 but it was postponed because she was sick.   Thyroid nodule: used to see Dr. Richardson Landry and also lost to follow up, TSH within normal limits . She states Dr. Posey Pronto will check level and decide if she needs to see Endo   Back pain: she states having back problems and went to [facility] taking muscle relaxer- Zanaflex,  and could not drive it today.   Patient Active Problem List   Diagnosis Date Noted   Migraine without aura and responsive to treatment 11/20/2020   Cervical nerve root disorder 11/20/2020   Female infertility due to ovulation failure 11/20/2020   B12 deficiency 11/20/2020   Obesity (BMI 30-39.9)    Diabetes mellitus with hyperglycemia ([place]) 06/14/2020   Anxiety, generalized 12/13/2019   Binge eating disorder 08/23/2019   Mild intermittent asthma without complication 39/76/7341   Hypertriglyceridemia 02/07/2016   Morbid obesity ([place]) 01/25/2016   Barrett's esophagus determined by biopsy 01/21/2016   Carpal tunnel syndrome, bilateral 01/21/2016   Hx  gestational diabetes 01/21/2016   Neck pain 09/08/2012   INSOMNIA 09/21/2008   NEPHROLITHIASIS, HX OF 04/17/2008   THYROID NODULE 01/12/2008   GERD 05/30/2007   ENDOMETRIOSIS OF OTHER SPECIFIED SITES 05/30/2007   Lupus ([place]) 05/30/2007   HERNIATED LUMBAR [place] 05/30/2007   BACK PAIN 05/30/2007    Past Surgical History:  Procedure Laterality Date   APPENDECTOMY     CESAREAN SECTION     x2   CESAREAN SECTION WITH BILATERAL TUBAL LIGATION Bilateral 08/30/2015   Procedure: CESAREAN SECTION WITH BILATERAL TUBAL LIGATION;  Surgeon: Honor Loh Ward, MD;  Location: [facility] ORS;  Service: Obstetrics;  Laterality: Bilateral;   CHOLECYSTECTOMY     LAPAROSCOPY     x3   THYROID CYST EXCISION     Dr. Trena Platt, [place]    Family History  Problem Relation Age of Onset   Depression Mother        Bi-polar   Asthma Mother    Allergies Mother    Sleep apnea Mother    Allergies Sister    Diabetes Sister    Rheum arthritis Maternal Grandmother    Hypertension Paternal Grandmother    Macular degeneration Paternal Grandmother    Hyperlipidemia Other    Hypertension Other    Heart disease Other     Social History   Tobacco Use   Smoking status: Never   Smokeless tobacco: Never  Substance Use Topics   Alcohol  use: No    Alcohol/week: 1.0 - 2.0 standard drink of alcohol    Types: 1 - 2 Standard drinks or equivalent per week    Comment: discontinued use during pregnancy     Current Outpatient Medications:    metFORMIN (GLUCOPHAGE-XR) 750 MG 24 hr tablet, TAKE 2 TABLETS (1,500 MG TOTAL) BY MOUTH EVERY DAY WITH BREAKFAST, Disp: 180 tablet, Rfl: 0   ACCU-CHEK GUIDE test strip, USE UP TO 4 TIMES DAILY AS DIRECTED, Disp: , Rfl:    Accu-Chek Softclix Lancets lancets, SMARTSIG:Topical 1-4 Times Daily, Disp: , Rfl:    albuterol (VENTOLIN HFA) 108 (90 Base) MCG/ACT inhaler, Inhale 2 puffs into the lungs every 6 (six) hours as needed for wheezing or shortness of breath., Disp: 8 g, Rfl: 0    ALPRAZolam (XANAX) 1 MG tablet, Take 1 mg by mouth daily as needed., Disp: , Rfl:    BD PEN NEEDLE NANO 2ND GEN 32G X 4 MM MISC, 1 DOSE BY DOES NOT APPLY ROUTE 3 (THREE) TIMES DAILY BEFORE MEALS., Disp: , Rfl:    blood glucose meter kit and supplies, Dispense based on patient and insurance preference. Use up to four times daily as directed. (FOR ICD-10 E10.9, E11.9)., Disp: 1 each, Rfl: 0   Cyanocobalamin (B-12) 1000 MCG SUBL, Place under the tongue., Disp: , Rfl:    dexamethasone (DECADRON) 2 MG tablet, Take 2 tablets by mouth daily for 2 days, then 1 tablet by mouth daily for 2 days, then 1/2 tablet by mouth daily for 2 days., Disp: 7 tablet, Rfl: 0   escitalopram (LEXAPRO) 20 MG tablet, Take 10 tablets by mouth daily., Disp: , Rfl:    esomeprazole (NEXIUM) 20 MG capsule, Take 20 mg by mouth daily. , Disp: , Rfl:    ferrous sulfate 325 (65 FE) MG tablet, Take 325 mg by mouth daily., Disp: , Rfl:    LANTUS SOLOSTAR 100 UNIT/ML Solostar Pen, INJECT 25-50 UNITS INTO THE SKIN DAILY., Disp: 15 mL, Rfl: 0   loratadine (CLARITIN) 10 MG tablet, Take 10 mg by mouth daily., Disp: , Rfl:    Magnesium 250 MG TABS, Take 1 tablet by mouth daily., Disp: , Rfl:    OZEMPIC, 1 MG/DOSE, 4 MG/3ML SOPN, INJECT 1 MG ONCE A WEEK AS DIRECTED, Disp: 9 mL, Rfl: 0   zolpidem (AMBIEN) 10 MG tablet, Take 10 mg by mouth at bedtime., Disp: , Rfl:   Allergies  Allergen Reactions   Compazine [Prochlorperazine] Anaphylaxis   Prednisone Anaphylaxis   Prochlorperazine Edisylate Anaphylaxis   Etodolac     Other reaction(s): Unknown   Farxiga [Dapagliflozin]     Constipation and headaches   Hydrocodone-Acetaminophen Nausea Only   Morphine And Related Nausea Only and Other (See Comments)    Mood swings, headache    I personally reviewed active problem list, medication list, allergies, family history, social history, health maintenance with the patient/caregiver today.   ROS  ***  Objective  There were no vitals  filed for this visit.  There is no height or weight on file to calculate BMI.  Physical Exam ***  Recent Results (from the past 2160 hour(s))  HM MAMMOGRAPHY     Status: None   Collection Time: 11/19/21 12:00 AM  Result Value Ref Range   HM Mammogram 0-4 Bi-Rad 0-4 Bi-Rad, Self Reported Normal    Diabetic Foot Exam: Diabetic Foot Exam - Simple   No data filed    ***  PHQ2/9:    10/22/2021  1:03 PM 07/09/2021    8:07 AM 06/23/2021    1:06 PM 03/25/2021    2:09 PM 01/28/2021    8:15 AM  Depression screen PHQ 2/9  Decreased Interest 0 0 0 0 0  Down, Depressed, Hopeless 0 0 0 0 0  PHQ - 2 Score 0 0 0 0 0  Altered sleeping 0 0 0 0 0  Tired, decreased energy 0 0 0 0 0  Change in appetite 0 0 0 0 0  Feeling bad or failure about yourself  0 0 0 0 0  Trouble concentrating 0 0 0 0 0  Moving slowly or fidgety/restless 0 0 0 0 0  Suicidal thoughts 0 0 0 0 0  PHQ-9 Score 0 0 0 0 0  Difficult doing work/chores Not difficult at all Not difficult at all   Not difficult at all    phq 9 is {gen pos [id number]}   Fall Risk:    10/22/2021    1:03 PM 07/09/2021    8:07 AM 06/23/2021    1:06 PM 03/25/2021    2:08 PM 01/28/2021    8:15 AM  Fall Risk   Falls in the past year? 1 0 0 0 0  Number falls in past yr: 0 0 0 0 0  Injury with Fall? 0 0 0 0 0  Risk for fall due to : Impaired balance/gait No Fall Risks No Fall Risks No Fall Risks No Fall Risks  Follow up Falls prevention discussed;Education provided Falls prevention discussed Falls prevention discussed Falls prevention discussed Falls prevention discussed      Functional Status Survey:      Assessment & Plan  *** There are no diagnoses linked to this encounter.

## 2022-01-14 ENCOUNTER — Ambulatory Visit: Payer: Medicare Other | Admitting: Family Medicine

## 2022-01-14 DIAGNOSIS — E1165 Type 2 diabetes mellitus with hyperglycemia: Secondary | ICD-10-CM

## 2022-01-20 ENCOUNTER — Encounter: Payer: Self-pay | Admitting: Family Medicine

## 2022-01-20 ENCOUNTER — Ambulatory Visit (INDEPENDENT_AMBULATORY_CARE_PROVIDER_SITE_OTHER): Payer: Medicare Other | Admitting: Family Medicine

## 2022-01-20 VITALS — BP 116/82 | HR 98 | Temp 98.2°F | Resp 16 | Ht 60.0 in | Wt 186.0 lb

## 2022-01-20 DIAGNOSIS — J302 Other seasonal allergic rhinitis: Secondary | ICD-10-CM | POA: Insufficient documentation

## 2022-01-20 DIAGNOSIS — Z79899 Other long term (current) drug therapy: Secondary | ICD-10-CM | POA: Diagnosis not present

## 2022-01-20 DIAGNOSIS — R7989 Other specified abnormal findings of blood chemistry: Secondary | ICD-10-CM | POA: Diagnosis not present

## 2022-01-20 DIAGNOSIS — E785 Hyperlipidemia, unspecified: Secondary | ICD-10-CM

## 2022-01-20 DIAGNOSIS — E1169 Type 2 diabetes mellitus with other specified complication: Secondary | ICD-10-CM | POA: Diagnosis not present

## 2022-01-20 DIAGNOSIS — R5383 Other fatigue: Secondary | ICD-10-CM

## 2022-01-20 DIAGNOSIS — F411 Generalized anxiety disorder: Secondary | ICD-10-CM

## 2022-01-20 DIAGNOSIS — J4520 Mild intermittent asthma, uncomplicated: Secondary | ICD-10-CM | POA: Diagnosis not present

## 2022-01-20 DIAGNOSIS — D509 Iron deficiency anemia, unspecified: Secondary | ICD-10-CM | POA: Diagnosis not present

## 2022-01-20 DIAGNOSIS — K2270 Barrett's esophagus without dysplasia: Secondary | ICD-10-CM | POA: Diagnosis not present

## 2022-01-20 DIAGNOSIS — Z5181 Encounter for therapeutic drug level monitoring: Secondary | ICD-10-CM | POA: Diagnosis not present

## 2022-01-20 DIAGNOSIS — Z794 Long term (current) use of insulin: Secondary | ICD-10-CM

## 2022-01-20 DIAGNOSIS — E1165 Type 2 diabetes mellitus with hyperglycemia: Secondary | ICD-10-CM

## 2022-01-20 DIAGNOSIS — E538 Deficiency of other specified B group vitamins: Secondary | ICD-10-CM

## 2022-01-20 MED ORDER — MONTELUKAST SODIUM 10 MG PO TABS: 10.0000 mg | ORAL_TABLET | Freq: Every day | ORAL | 3 refills | Status: DC

## 2022-01-20 MED ORDER — IPRATROPIUM BROMIDE 0.03 % NA SOLN: 2.0000 | Freq: Two times a day (BID) | NASAL | 12 refills | Status: DC

## 2022-01-20 NOTE — Assessment & Plan Note (Signed)
Sx well controlled, not using rescue inhaler, no recent need for steroid tx

## 2022-01-20 NOTE — Assessment & Plan Note (Signed)
No dysphagia, GERD sx well controlled, continue nexium

## 2022-01-20 NOTE — Assessment & Plan Note (Signed)
Per Dr. Toy Care

## 2022-01-20 NOTE — Assessment & Plan Note (Signed)
Over a month of worse allergy sx, only on claritin, explained she can try a different 2nd gen antihistamine, add nose sprays, add montelukast Likely her raspy voice is from post-nasal drip - erythematous, edematous nasal mucosa with profuse nasal discharge, OP looks grossly normal Encouraged increasing allergy med with worse seasonal allergies

## 2022-01-20 NOTE — Progress Notes (Signed)
Name: Donna Haas   MRN: [id number]    DOB: 01-Sep-1975   Date:01/20/2022       Progress Note  Chief Complaint  Patient presents with   Follow-up   Diabetes   Hyperlipidemia   Fatigue    Pt states still feeling tired with intaking Viatmin b-12 sublingual.      Subjective:   Donna Haas is a 46 y.o. female, presents to clinic for routine f/up and multiple acute complaints   Raspy voice/laryngitis for 3 + weeks, covid positive a week ago then tested neg, no trouble swallowing, sore throat Hx of GERD and barrett's she is on nexium for that long term and hasn't noted any reflux worsening or nighttime sx She was on singulair in the past but stopped it, has well controlled mild intermittent asthma and she endorses hx of severe seasonal and environmental allergies  She also complains of severe fatigue - she was told by Dr. Ancil Boozer to do B12 sublingual tx and f/up in office if not better, she has been doing this and notes no improvement at all, reports she is so tired she could fall asleep right now Lab Results  Component Value Date   VITAMINB12 237 05/30/2007     Thyroid nodule and surgery previously- labs monitored with Primary, no longer seeing ENT/[facility] after neg biopsy, and no further [place] f/up she says, shes not been on thyroid meds previously Hx of anemia, iron deficiency, anxiety/depression/insomnia managed by psych with multiple meds and controlled substances, also hx of lupus, she denies any recent flares Lab Results  Component Value Date   TSH 2.21 10/23/2020    IDDM she has been decreasing lantus dose, is also on metformin XR 1500 mg  and ozempic 1 mg weekly shot Lab Results  Component Value Date   HGBA1C 6.3 (H) 07/31/2021  Lantus down to 12 units once daily  BP 116/82 today not on BP meds/ACEI/ARB, also hx of statin myopathy, not on statins Lab Results  Component Value Date   CHOL 191 10/23/2020   HDL 43 (L) 10/23/2020   LDLCALC 114 (H) 10/23/2020    LDLDIRECT 132.4 05/30/2007   TRIG 227 (H) 10/23/2020   CHOLHDL 4.4 10/23/2020    Insomnia anxiety [place] Ambien 10 mg every night Xanax 1-1.5 at night at 7-8 pm  Lexapro 20 mg   Anemia (for years after having kids - last labs took like H/H and iron normal)- she is still taking iron and B12 Lab Results  Component Value Date   IRON 50 07/03/2020   TIBC 389 07/03/2020   FERRITIN 225 07/03/2020   Hemoglobin  Date Value Ref Range Status  07/03/2020 13.5 11.7 - 15.5 g/dL Final  06/17/2020 13.4 12.0 - 15.0 g/dL Final  06/15/2020 12.2 12.0 - 15.0 g/dL Final  06/14/2020 12.0 12.0 - 15.0 g/dL Final       Current Outpatient Medications:    ACCU-CHEK GUIDE test strip, USE UP TO 4 TIMES DAILY AS DIRECTED, Disp: , Rfl:    Accu-Chek Softclix Lancets lancets, SMARTSIG:Topical 1-4 Times Daily, Disp: , Rfl:    ALPRAZolam (XANAX) 1 MG tablet, Take 1 mg by mouth daily as needed., Disp: , Rfl:    BD PEN NEEDLE NANO 2ND GEN 32G X 4 MM MISC, 1 DOSE BY DOES NOT APPLY ROUTE 3 (THREE) TIMES DAILY BEFORE MEALS., Disp: , Rfl:    blood glucose meter kit and supplies, Dispense based on patient and insurance preference. Use up to four times daily  as directed. (FOR ICD-10 E10.9, E11.9)., Disp: 1 each, Rfl: 0   Cyanocobalamin (B-12) 1000 MCG SUBL, Place under the tongue., Disp: , Rfl:    escitalopram (LEXAPRO) 20 MG tablet, Take 10 tablets by mouth daily., Disp: , Rfl:    esomeprazole (NEXIUM) 20 MG capsule, Take 20 mg by mouth daily. , Disp: , Rfl:    ferrous sulfate 325 (65 FE) MG tablet, Take 325 mg by mouth daily., Disp: , Rfl:    LANTUS SOLOSTAR 100 UNIT/ML Solostar Pen, INJECT 25-50 UNITS INTO THE SKIN DAILY., Disp: 15 mL, Rfl: 0   loratadine (CLARITIN) 10 MG tablet, Take 10 mg by mouth daily., Disp: , Rfl:    Magnesium 250 MG TABS, Take 1 tablet by mouth daily., Disp: , Rfl:    metFORMIN (GLUCOPHAGE-XR) 750 MG 24 hr tablet, TAKE 2 TABLETS (1,500 MG TOTAL) BY MOUTH EVERY DAY WITH BREAKFAST, Disp: 180  tablet, Rfl: 0   OZEMPIC, 1 MG/DOSE, 4 MG/3ML SOPN, INJECT 1 MG ONCE A WEEK AS DIRECTED, Disp: 9 mL, Rfl: 0   zolpidem (AMBIEN) 10 MG tablet, Take 10 mg by mouth at bedtime., Disp: , Rfl:    albuterol (VENTOLIN HFA) 108 (90 Base) MCG/ACT inhaler, Inhale 2 puffs into the lungs every 6 (six) hours as needed for wheezing or shortness of breath. (Patient not taking: Reported on 01/20/2022), Disp: 8 g, Rfl: 0   dexamethasone (DECADRON) 2 MG tablet, Take 2 tablets by mouth daily for 2 days, then 1 tablet by mouth daily for 2 days, then 1/2 tablet by mouth daily for 2 days. (Patient not taking: Reported on 01/20/2022), Disp: 7 tablet, Rfl: 0   OXcarbazepine (TRILEPTAL) 150 MG tablet, Take 150 mg by mouth 2 (two) times daily. PRN (Patient not taking: Reported on 01/20/2022), Disp: , Rfl:   Patient Active Problem List   Diagnosis Date Noted   Migraine without aura and responsive to treatment 11/20/2020   Cervical nerve root disorder 11/20/2020   Female infertility due to ovulation failure 11/20/2020   B12 deficiency 11/20/2020   Obesity (BMI 30-39.9)    Diabetes mellitus with hyperglycemia ([place]) 06/14/2020   Anxiety, generalized 12/13/2019   Binge eating disorder 08/23/2019   Mild intermittent asthma without complication 05/69/7948   Hypertriglyceridemia 02/07/2016   Morbid obesity ([place]) 01/25/2016   Barrett's esophagus determined by biopsy 01/21/2016   Carpal tunnel syndrome, bilateral 01/21/2016   Hx gestational diabetes 01/21/2016   Neck pain 09/08/2012   INSOMNIA 09/21/2008   NEPHROLITHIASIS, HX OF 04/17/2008   THYROID NODULE 01/12/2008   GERD 05/30/2007   ENDOMETRIOSIS OF OTHER SPECIFIED SITES 05/30/2007   Lupus ([place]) 05/30/2007   HERNIATED LUMBAR [place] 05/30/2007   BACK PAIN 05/30/2007    Past Surgical History:  Procedure Laterality Date   APPENDECTOMY     CESAREAN SECTION     x2   CESAREAN SECTION WITH BILATERAL TUBAL LIGATION Bilateral 08/30/2015   Procedure: CESAREAN SECTION  WITH BILATERAL TUBAL LIGATION;  Surgeon: Honor Loh Ward, MD;  Location: [facility] ORS;  Service: Obstetrics;  Laterality: Bilateral;   CHOLECYSTECTOMY     LAPAROSCOPY     x3   THYROID CYST EXCISION     Dr. Trena Platt, [place]    Family History  Problem Relation Age of Onset   Depression Mother        Bi-polar   Asthma Mother    Allergies Mother    Sleep apnea Mother    Allergies Sister    Diabetes Sister    Rheum  arthritis Maternal Grandmother    Hypertension Paternal Grandmother    Macular degeneration Paternal Grandmother    Hyperlipidemia Other    Hypertension Other    Heart disease Other     Social History   Tobacco Use   Smoking status: Never   Smokeless tobacco: Never  Vaping Use   Vaping Use: Never used  Substance Use Topics   Alcohol use: No    Alcohol/week: 1.0 - 2.0 standard drink of alcohol    Types: 1 - 2 Standard drinks or equivalent per week    Comment: discontinued use during pregnancy   Drug use: No     Allergies  Allergen Reactions   Compazine [Prochlorperazine] Anaphylaxis   Prednisone Anaphylaxis   Prochlorperazine Edisylate Anaphylaxis   Etodolac     Other reaction(s): Unknown   Farxiga [Dapagliflozin]     Constipation and headaches   Hydrocodone-Acetaminophen Nausea Only   Morphine And Related Nausea Only and Other (See Comments)    Mood swings, headache    Health Maintenance  Topic Date Due   OPHTHALMOLOGY EXAM  Never done   PAP SMEAR-Modifier  01/03/2020   FOOT EXAM  07/03/2021   Diabetic kidney evaluation - GFR measurement  10/23/2021   COVID-19 Vaccine (3 - Pfizer series) 02/05/2022 (Originally 10/19/2019)   INFLUENZA VACCINE  07/12/2022 (Originally 11/11/2021)   HEMOGLOBIN A1C  01/30/2022   Diabetic kidney evaluation - Urine ACR  08/01/2022   MAMMOGRAM  11/20/2022   TETANUS/TDAP  07/03/2025   Hepatitis C Screening  Completed   HIV Screening  Completed   HPV VACCINES  Aged Out    Chart Review Today: I personally reviewed active  problem list, medication list, allergies, family history, social history, health maintenance, notes from last encounter, lab results, imaging with the patient/caregiver today.   Review of Systems  Constitutional: Negative.   HENT: Negative.    Eyes: Negative.   Respiratory: Negative.    Cardiovascular: Negative.   Gastrointestinal: Negative.   Endocrine: Negative.   Genitourinary: Negative.   Musculoskeletal: Negative.   Skin: Negative.   Allergic/Immunologic: Negative.   Neurological: Negative.   Hematological: Negative.   Psychiatric/Behavioral: Negative.    All other systems reviewed and are negative.    Objective:   Vitals:   01/20/22 0928  BP: 116/82  Pulse: 98  Resp: 16  Temp: 98.2 F (36.8 C)  TempSrc: Oral  SpO2: 99%  Weight: 186 lb (84.4 kg)  Height: 5' (1.524 m)    Body mass index is 36.33 kg/m.  Physical Exam Vitals and nursing note reviewed.  Constitutional:      General: She is not in acute distress.    Appearance: Normal appearance. She is well-developed and well-groomed. She is obese. She is not ill-appearing, toxic-appearing or diaphoretic.  HENT:     Head: Normocephalic and atraumatic.     Jaw: There is normal jaw occlusion.     Right Ear: Hearing, tympanic membrane, ear canal and external ear normal.     Left Ear: Hearing, tympanic membrane, ear canal and external ear normal.     Nose: Mucosal edema, congestion and rhinorrhea (profuse) present. Rhinorrhea is clear.     Right Turbinates: Enlarged.     Left Turbinates: Enlarged.     Right Sinus: No maxillary sinus tenderness or frontal sinus tenderness.     Left Sinus: No maxillary sinus tenderness or frontal sinus tenderness.     Comments: Nasal mucosal edema, erythema and copious mucous    Mouth/Throat:  Mouth: Mucous membranes are moist.     Tongue: No lesions.     Pharynx: Oropharynx is clear. Uvula midline. No pharyngeal swelling, oropharyngeal exudate, posterior oropharyngeal erythema  or uvula swelling.     Tonsils: 0 on the right. 0 on the left.  Eyes:     General: Lids are normal. No scleral icterus.       Right eye: No discharge.        Left eye: No discharge.     Conjunctiva/sclera: Conjunctivae normal.  Neck:     Trachea: Phonation normal. No tracheal deviation.  Cardiovascular:     Rate and Rhythm: Normal rate and regular rhythm.     Pulses: Normal pulses.          Radial pulses are 2+ on the right side and 2+ on the left side.       Posterior tibial pulses are 2+ on the right side and 2+ on the left side.     Heart sounds: Normal heart sounds. No murmur heard.    No friction rub. No gallop.  Pulmonary:     Effort: Pulmonary effort is normal. No respiratory distress.     Breath sounds: Normal breath sounds. No stridor. No wheezing, rhonchi or rales.  Chest:     Chest wall: No tenderness.  Abdominal:     General: Bowel sounds are normal. There is no distension.     Palpations: Abdomen is soft.  Musculoskeletal:     Right lower leg: No edema.     Left lower leg: No edema.  Skin:    General: Skin is warm and dry.     Coloration: Skin is not jaundiced or pale.     Findings: No rash.  Neurological:     Mental Status: She is alert.     Motor: No abnormal muscle tone.     Gait: Gait normal.  Psychiatric:        Mood and Affect: Mood normal.        Speech: Speech normal.        Behavior: Behavior normal. Behavior is cooperative.    Diabetic Foot Exam - Simple   Simple Foot Form Diabetic Foot exam was performed with the following findings: Yes 01/20/2022 10:13 AM  Visual Inspection No deformities, no ulcerations, no other skin breakdown bilaterally: Yes Sensation Testing Intact to touch and monofilament testing bilaterally: Yes Pulse Check Posterior Tibialis and Dorsalis pulse intact bilaterally: Yes Comments         Assessment & Plan:   Problem List Items Addressed This Visit       Respiratory   Mild intermittent asthma without  complication    Sx well controlled, not using rescue inhaler, no recent need for steroid tx      Relevant Medications   montelukast (SINGULAIR) 10 MG tablet   Seasonal allergic rhinitis    Over a month of worse allergy sx, only on claritin, explained she can try a different 2nd gen antihistamine, add nose sprays, add montelukast Likely her raspy voice is from post-nasal drip - erythematous, edematous nasal mucosa with profuse nasal discharge, OP looks grossly normal Encouraged increasing allergy med with worse seasonal allergies      Relevant Medications   montelukast (SINGULAIR) 10 MG tablet   ipratropium (ATROVENT) 0.03 % nasal spray     Digestive   Barrett's esophagus determined by biopsy    No dysphagia, GERD sx well controlled, continue nexium        Endocrine  Diabetes mellitus with hyperglycemia (HCC) - Primary    Hx of being well controlled, on insulin, but decreasing amount Also on metformin and ozempic Currently 12 units lantus each night, sugars in am 90-120, no highs or lows Foot exam done today Pt due for eye exam, reminded her to call [facility] to schedule Not on ACEI/ARB or statin (per PCP management and past documentation)  Due for all labs today Will send in her refills once labs result      Relevant Orders   Ambulatory referral to Ophthalmology   Hemoglobin A1c   Microalbumin / creatinine urine ratio   HM DIABETES FOOT EXAM (Completed)   COMPLETE METABOLIC PANEL WITH GFR     Other   Anxiety, generalized    Per Dr. Toy Care      B12 deficiency    No improvement with sx with sublingual B12, recheck labs and she can do B12 shots if labs/sx not improved      Relevant Orders   CBC with Differential/Platelet   Vitamin B12   Other Visit Diagnoses     Dyslipidemia associated with type 2 diabetes mellitus ([place])       recheck labs, statin intolerant   Relevant Orders   Hemoglobin A1c   Lipid panel   COMPLETE METABOLIC PANEL WITH GFR   Elevated  LFTs       recheck labs   Relevant Orders   COMPLETE METABOLIC PANEL WITH GFR   Encounter for medication monitoring       Relevant Orders   Hemoglobin A1c   Microalbumin / creatinine urine ratio   CBC with Differential/Platelet   TSH   Vitamin B12   COMPLETE METABOLIC PANEL WITH GFR   Iron deficiency anemia, unspecified iron deficiency anemia type       last iron and H/H appeared in normal range, pt still taking iron supplement   Relevant Orders   CBC with Differential/Platelet   Vitamin B12   Iron, TIBC and Ferritin Panel   Fatigue, unspecified type       likely multifactorial - chronic dx, polypharmacy, anxiety, lupus, will rule out anemia, hypothyroid, deficiencies   Relevant Orders   CBC with Differential/Platelet   TSH   Vitamin B12   COMPLETE METABOLIC PANEL WITH GFR   Iron, TIBC and Ferritin Panel   Polypharmacy       was on klonopin, xanax and ambien - now only on xanax and [place] takes both at night, explained this may be causing sx, high risk         She will return on Friday for nurse visit to get flu shot - if she needs the B12 IM she will start that on Friday as well  Return in about 6 months (around 07/22/2022) for Routine follow-up with PCP.   Delsa Grana, PA-C 01/20/22 9:50 AM

## 2022-01-20 NOTE — Assessment & Plan Note (Signed)
No improvement with sx with sublingual B12, recheck labs and she can do B12 shots if labs/sx not improved

## 2022-01-20 NOTE — Assessment & Plan Note (Signed)
Hx of being well controlled, on insulin, but decreasing amount Also on metformin and ozempic Currently 12 units lantus each night, sugars in am 90-120, no highs or lows Foot exam done today Pt due for eye exam, reminded her to call [facility] to schedule Not on ACEI/ARB or statin (per PCP management and past documentation)  Due for all labs today Will send in her refills once labs result

## 2022-01-20 NOTE — Patient Instructions (Signed)
Can try different allergy meds like allegra or xyzal Nasal spray flonase, nasonex, nasocort, or antihistatmine nose sprays

## 2022-01-21 ENCOUNTER — Other Ambulatory Visit: Payer: Self-pay | Admitting: Family Medicine

## 2022-01-21 ENCOUNTER — Encounter: Payer: Self-pay | Admitting: Family Medicine

## 2022-01-21 DIAGNOSIS — E1165 Type 2 diabetes mellitus with hyperglycemia: Secondary | ICD-10-CM

## 2022-01-21 DIAGNOSIS — Z794 Long term (current) use of insulin: Secondary | ICD-10-CM

## 2022-01-21 DIAGNOSIS — E1169 Type 2 diabetes mellitus with other specified complication: Secondary | ICD-10-CM

## 2022-01-21 LAB — COMPLETE METABOLIC PANEL WITHOUT GFR
AG Ratio: 1.6 (calc) (ref 1.0–2.5)
ALT: 21 U/L (ref 6–29)
AST: 19 U/L (ref 10–30)
Albumin: 4.4 g/dL (ref 3.6–5.1)
Alkaline phosphatase (APISO): 101 U/L (ref 31–125)
BUN: 10 mg/dL (ref 7–25)
CO2: 24 mmol/L (ref 20–32)
Calcium: 9.7 mg/dL (ref 8.6–10.2)
Chloride: 102 mmol/L (ref 98–110)
Creat: 0.69 mg/dL (ref 0.50–0.99)
Globulin: 2.8 g/dL (ref 1.9–3.7)
Glucose, Bld: 110 mg/dL — ABNORMAL HIGH (ref 65–99)
Potassium: 4.9 mmol/L (ref 3.5–5.3)
Sodium: 138 mmol/L (ref 135–146)
Total Bilirubin: 0.2 mg/dL (ref 0.2–1.2)
Total Protein: 7.2 g/dL (ref 6.1–8.1)
eGFR: 110 mL/min/{1.73_m2}

## 2022-01-21 LAB — CBC WITH DIFFERENTIAL/PLATELET
Absolute Monocytes: 583 {cells}/uL (ref 200–950)
Basophils Absolute: 54 {cells}/uL (ref 0–200)
Basophils Relative: 0.5 %
Eosinophils Absolute: 227 {cells}/uL (ref 15–500)
Eosinophils Relative: 2.1 %
HCT: 34.6 % — ABNORMAL LOW (ref 35.0–45.0)
Hemoglobin: 11.7 g/dL (ref 11.7–15.5)
Lymphs Abs: 1717 {cells}/uL (ref 850–3900)
MCH: 28.1 pg (ref 27.0–33.0)
MCHC: 33.8 g/dL (ref 32.0–36.0)
MCV: 83 fL (ref 80.0–100.0)
MPV: 10.5 fL (ref 7.5–12.5)
Monocytes Relative: 5.4 %
Neutro Abs: 8219 {cells}/uL — ABNORMAL HIGH (ref 1500–7800)
Neutrophils Relative %: 76.1 %
Platelets: 333 10*3/uL (ref 140–400)
RBC: 4.17 Million/uL (ref 3.80–5.10)
RDW: 13.3 % (ref 11.0–15.0)
Total Lymphocyte: 15.9 %
WBC: 10.8 10*3/uL (ref 3.8–10.8)

## 2022-01-21 LAB — MICROALBUMIN / CREATININE URINE RATIO
Creatinine, Urine: 165 mg/dL (ref 20–275)
Microalb Creat Ratio: 8 ug/mg{creat}
Microalb, Ur: 1.3 mg/dL

## 2022-01-21 LAB — LIPID PANEL
Cholesterol: 187 mg/dL
HDL: 41 mg/dL — ABNORMAL LOW
LDL Cholesterol (Calc): 101 mg/dL — ABNORMAL HIGH
Non-HDL Cholesterol (Calc): 146 mg/dL — ABNORMAL HIGH
Total CHOL/HDL Ratio: 4.6 (calc)
Triglycerides: 336 mg/dL — ABNORMAL HIGH

## 2022-01-21 LAB — TSH: TSH: 2.12 m[IU]/L

## 2022-01-21 LAB — IRON,TIBC AND FERRITIN PANEL
%SAT: 9 % — ABNORMAL LOW (ref 16–45)
Ferritin: 35 ng/mL (ref 16–232)
Iron: 37 ug/dL — ABNORMAL LOW (ref 40–190)
TIBC: 398 ug/dL (ref 250–450)

## 2022-01-21 LAB — HEMOGLOBIN A1C
Hgb A1c MFr Bld: 6.1 %{Hb} — ABNORMAL HIGH
Mean Plasma Glucose: 128 mg/dL
eAG (mmol/L): 7.1 mmol/L

## 2022-01-21 LAB — VITAMIN B12: Vitamin B-12: 579 pg/mL (ref 200–1100)

## 2022-01-21 MED ORDER — LANTUS SOLOSTAR 100 UNIT/ML ~~LOC~~ SOPN: 5.0000 [IU] | PEN_INJECTOR | Freq: Every day | SUBCUTANEOUS | 1 refills | Status: DC

## 2022-01-21 MED ORDER — METFORMIN HCL ER 750 MG PO TB24: ORAL_TABLET | ORAL | 1 refills | Status: DC

## 2022-01-22 ENCOUNTER — Other Ambulatory Visit: Payer: Self-pay | Admitting: Family Medicine

## 2022-01-22 MED ORDER — SEMAGLUTIDE (2 MG/DOSE) 8 MG/3ML ~~LOC~~ SOPN: 2.0000 mg | PEN_INJECTOR | SUBCUTANEOUS | 1 refills | Status: DC

## 2022-01-22 NOTE — Addendum Note (Signed)
Addended by: Delsa Grana on: 01/22/2022 05:20 PM   Modules accepted: Orders

## 2022-01-23 ENCOUNTER — Other Ambulatory Visit: Payer: Self-pay | Admitting: Family Medicine

## 2022-01-23 DIAGNOSIS — E1169 Type 2 diabetes mellitus with other specified complication: Secondary | ICD-10-CM

## 2022-01-23 DIAGNOSIS — E1165 Type 2 diabetes mellitus with hyperglycemia: Secondary | ICD-10-CM

## 2022-01-23 DIAGNOSIS — Z794 Long term (current) use of insulin: Secondary | ICD-10-CM

## 2022-02-02 ENCOUNTER — Telehealth: Payer: Self-pay | Admitting: Family Medicine

## 2022-02-02 ENCOUNTER — Telehealth: Payer: Self-pay

## 2022-02-02 DIAGNOSIS — E1169 Type 2 diabetes mellitus with other specified complication: Secondary | ICD-10-CM

## 2022-02-02 NOTE — Telephone Encounter (Signed)
Copied from [place] [id number]. Topic: General - Inquiry >> Feb 02, 2022  3:52 PM Chapman Fitch wrote: Reason for CRM: Pt still can not get Ozemic Rx from a pharmacy and it has been 2 weeks / pt would like to know if the office has any samples until pharmacy can get in stock / please advise

## 2022-02-03 MED ORDER — TIRZEPATIDE 5 MG/0.5ML ~~LOC~~ SOAJ: 5.0000 mg | SUBCUTANEOUS | 0 refills | Status: DC

## 2022-02-03 NOTE — Telephone Encounter (Signed)
Pt was here on 01/20/2022 and has a 6 mth appt scheduled with you in April. Does she still need one in Jan?

## 2022-02-04 NOTE — Telephone Encounter (Signed)
Lvm for pt to schedule anytime in January per Dr Ancil Boozer

## 2022-02-16 ENCOUNTER — Encounter: Payer: Self-pay | Admitting: Family Medicine

## 2022-02-26 LAB — HM MAMMOGRAPHY

## 2022-03-29 ENCOUNTER — Encounter: Payer: Self-pay | Admitting: Family Medicine

## 2022-04-01 ENCOUNTER — Other Ambulatory Visit: Payer: Self-pay | Admitting: Family Medicine

## 2022-04-01 MED ORDER — SEMAGLUTIDE (1 MG/DOSE) 4 MG/3ML ~~LOC~~ SOPN: 1.0000 mg | PEN_INJECTOR | SUBCUTANEOUS | 0 refills | Status: DC

## 2022-04-26 ENCOUNTER — Other Ambulatory Visit: Payer: Self-pay | Admitting: Family Medicine

## 2022-04-26 DIAGNOSIS — E1169 Type 2 diabetes mellitus with other specified complication: Secondary | ICD-10-CM

## 2022-05-04 NOTE — Progress Notes (Deleted)
Name: Donna Haas   MRN: [id number]    DOB: [birth year]   Date:05/04/2022       Progress Note  Subjective  Chief Complaint  Medication Refill  HPI  DMII: she was seen by Lona Millard NP in June, her A1C was at goal  At 6.6 %, she states she stopped by for A1C after her last visit but A1C not in our chart. Explained she needs to come in for A1C since it has been over 6 months. . FSBS has been between 120/130's, she is going back and forth on dose of insulin advised to only titrate every 3 days. She denies polyphagia, polydipsia or polyuria. She has tried statin therapy in the past but could not tolerate it , causes weakness/nausea and myopathy, she is willing to try Ezetimibe   Morbid obesity: she states ozempic is helping curb her appetite she thinks she is still losing weight about 5 lbs since Dec. Discussed considering SGL- 2 agonist if A1C remains below 7 %   Lupus: diagnosed years ago, no rashes at this time, feels achy and has joint aches. She used to go to [facility] but not in years, Dr. Sanda Klein treated her with Plaquenil but it caused vision disturbances. She had an appointment with Dr. Posey Pronto in January 2023 but it was postponed because she was sick.   Thyroid nodule: used to see Dr. Richardson Landry and also lost to follow up, TSH within normal limits . She states Dr. Posey Pronto will check level and decide if she needs to see Endo   Back pain: she states having back problems and went to [facility] taking muscle relaxer- Zanaflex,  and could not drive it today.   Patient Active Problem List   Diagnosis Date Noted   Seasonal allergic rhinitis 01/20/2022   Migraine without aura and responsive to treatment 11/20/2020   Cervical nerve root disorder 11/20/2020   Female infertility due to ovulation failure 11/20/2020   B12 deficiency 11/20/2020   Obesity (BMI 30-39.9)    Diabetes mellitus with hyperglycemia ([place]) 06/14/2020   Anxiety, generalized 12/13/2019   Binge eating disorder 08/23/2019    Mild intermittent asthma without complication [id number]   Hypertriglyceridemia 02/07/2016   Morbid obesity ([place]) 01/25/2016   Barrett's esophagus determined by biopsy 01/21/2016   Carpal tunnel syndrome, bilateral 01/21/2016   Hx gestational diabetes 01/21/2016   Neck pain 09/08/2012   INSOMNIA 09/21/2008   NEPHROLITHIASIS, HX OF 04/17/2008   THYROID NODULE 01/12/2008   GERD 05/30/2007   ENDOMETRIOSIS OF OTHER SPECIFIED SITES 05/30/2007   Lupus ([place]) 05/30/2007   HERNIATED LUMBAR [place] 05/30/2007   BACK PAIN 05/30/2007    Past Surgical History:  Procedure Laterality Date   APPENDECTOMY     CESAREAN SECTION     x2   CESAREAN SECTION WITH BILATERAL TUBAL LIGATION Bilateral 08/30/2015   Procedure: CESAREAN SECTION WITH BILATERAL TUBAL LIGATION;  Surgeon: Honor Loh Ward, MD;  Location: [facility] ORS;  Service: Obstetrics;  Laterality: Bilateral;   CHOLECYSTECTOMY     LAPAROSCOPY     x3   THYROID CYST EXCISION     Dr. Trena Platt, [place]    Family History  Problem Relation Age of Onset   Depression Mother        Bi-polar   Asthma Mother    Allergies Mother    Sleep apnea Mother    Allergies Sister    Diabetes Sister    Rheum arthritis Maternal Grandmother    Hypertension Paternal Grandmother  Macular degeneration Paternal Grandmother    Hyperlipidemia Other    Hypertension Other    Heart disease Other     Social History   Tobacco Use   Smoking status: Never   Smokeless tobacco: Never  Substance Use Topics   Alcohol use: No    Alcohol/week: 1.0 - 2.0 standard drink of alcohol    Types: 1 - 2 Standard drinks or equivalent per week    Comment: discontinued use during pregnancy     Current Outpatient Medications:    ACCU-CHEK GUIDE test strip, USE UP TO 4 TIMES DAILY AS DIRECTED, Disp: , Rfl:    Accu-Chek Softclix Lancets lancets, SMARTSIG:Topical 1-4 Times Daily, Disp: , Rfl:    albuterol (VENTOLIN HFA) 108 (90 Base) MCG/ACT inhaler, Inhale 2 puffs into the lungs  every 6 (six) hours as needed for wheezing or shortness of breath. (Patient not taking: Reported on 01/20/2022), Disp: 8 g, Rfl: 0   ALPRAZolam (XANAX) 1 MG tablet, Take 1 mg by mouth daily as needed., Disp: , Rfl:    BD PEN NEEDLE NANO 2ND GEN 32G X 4 MM MISC, 1 DOSE BY DOES NOT APPLY ROUTE 3 (THREE) TIMES DAILY BEFORE MEALS., Disp: , Rfl:    blood glucose meter kit and supplies, Dispense based on patient and insurance preference. Use up to four times daily as directed. (FOR ICD-10 E10.9, E11.9)., Disp: 1 each, Rfl: 0   Cyanocobalamin (B-12) 1000 MCG SUBL, Place under the tongue., Disp: , Rfl:    escitalopram (LEXAPRO) 20 MG tablet, Take 10 tablets by mouth daily., Disp: , Rfl:    esomeprazole (NEXIUM) 20 MG capsule, Take 20 mg by mouth daily. , Disp: , Rfl:    ferrous sulfate 325 (65 FE) MG tablet, Take 325 mg by mouth daily., Disp: , Rfl:    insulin glargine (LANTUS SOLOSTAR) 100 UNIT/ML Solostar Pen, Inject 5-15 Units into the skin daily., Disp: 15 mL, Rfl: 1   ipratropium (ATROVENT) 0.03 % nasal spray, Place 2 sprays into both nostrils every 12 (twelve) hours., Disp: 30 mL, Rfl: 12   loratadine (CLARITIN) 10 MG tablet, Take 10 mg by mouth daily., Disp: , Rfl:    Magnesium 250 MG TABS, Take 1 tablet by mouth daily., Disp: , Rfl:    metFORMIN (GLUCOPHAGE-XR) 750 MG 24 hr tablet, TAKE 2 TABLETS (1,500 MG TOTAL) BY MOUTH EVERY DAY WITH BREAKFAST, Disp: 180 tablet, Rfl: 1   montelukast (SINGULAIR) 10 MG tablet, Take 1 tablet (10 mg total) by mouth at bedtime., Disp: 30 tablet, Rfl: 3   Semaglutide, 1 MG/DOSE, 4 MG/3ML SOPN, Inject 1 mg as directed once a week., Disp: 3 mL, Rfl: 0   zolpidem (AMBIEN) 10 MG tablet, Take 10 mg by mouth at bedtime., Disp: , Rfl:   Allergies  Allergen Reactions   Compazine [Prochlorperazine] Anaphylaxis   Prednisone Anaphylaxis   Prochlorperazine Edisylate Anaphylaxis   Etodolac     Other reaction(s): Unknown   Farxiga [Dapagliflozin]     Constipation and  headaches   Hydrocodone-Acetaminophen Nausea Only   Morphine And Related Nausea Only and Other (See Comments)    Mood swings, headache    I personally reviewed {Reviewed:[id number]} with the patient/caregiver today.   ROS  ***  Objective  There were no vitals filed for this visit.  There is no height or weight on file to calculate BMI.  Physical Exam ***  No results found for this or any previous visit (from the past 2160 hour(s)).  Diabetic Foot  Exam: Diabetic Foot Exam - Simple   No data filed    ***  PHQ2/9:    01/20/2022    9:21 AM 10/22/2021    1:03 PM 07/09/2021    8:07 AM 06/23/2021    1:06 PM 03/25/2021    2:09 PM  Depression screen PHQ 2/9  Decreased Interest 0 0 0 0 0  Down, Depressed, Hopeless 0 0 0 0 0  PHQ - 2 Score 0 0 0 0 0  Altered sleeping 0 0 0 0 0  Tired, decreased energy 0 0 0 0 0  Change in appetite 0 0 0 0 0  Feeling bad or failure about yourself  0 0 0 0 0  Trouble concentrating 0 0 0 0 0  Moving slowly or fidgety/restless 0 0 0 0 0  Suicidal thoughts 0 0 0 0 0  PHQ-9 Score 0 0 0 0 0  Difficult doing work/chores Not difficult at all Not difficult at all Not difficult at all      phq 9 is {gen pos [id number] ***  Fall Risk:    01/20/2022    9:21 AM 10/22/2021    1:03 PM 07/09/2021    8:07 AM 06/23/2021    1:06 PM 03/25/2021    2:08 PM  Fall Risk   Falls in the past year? 1 1 0 0 0  Number falls in past yr: 0 0 0 0 0  Injury with Fall? 0 0 0 0 0  Risk for fall due to : Impaired balance/gait Impaired balance/gait No Fall Risks No Fall Risks No Fall Risks  Follow up Falls prevention discussed;Education provided Falls prevention discussed;Education provided Falls prevention discussed Falls prevention discussed Falls prevention discussed   ***   Functional Status Survey:   ***   Assessment & Plan  *** There are no diagnoses linked to this encounter.

## 2022-05-05 ENCOUNTER — Ambulatory Visit: Payer: Medicare Other | Admitting: Family Medicine

## 2022-05-05 ENCOUNTER — Encounter: Payer: Self-pay | Admitting: Pharmacist

## 2022-05-05 NOTE — Progress Notes (Signed)
[facility] Team Statin Quality Measure Assessment   05/05/2022  Donna Haas 09-18-1975 [id number]  Per review of chart and payor information, [name] has a diagnosis of diabetes but is not currently filling a statin prescription.  This places patient into the Statin Use In Patients with Diabetes (SUPD) measure for CMS.    I could not find any documentation of previous trial of a statin or a history of statin intolerance.  In 07-09-2020 the patient was coded G72.0.  Please consider evaluating the patient for statin therapy or evaluate any past intolerances at today's office visit if clinically appropriate.  Please consider ONE of the following recommendations:  Initiate moderate intensity statin Atorvastatin 10 mg once daily, #90, 3 refills   Rosuvastatin 5 mg once daily, #90, 3 refills    Initiate low intensity          statin with reduced frequency if prior          statin intolerance 1x weekly, #13, 3 refills   2x weekly, #26, 3 refills   3x weekly, #39, 3 refills    Code for past statin intolerance or  other exclusions (required annually)  Provider Requirements: Associate code during an office visit or telehealth encounter  Drug Induced Myopathy G72.0   Myopathy, unspecified G72.9   Myositis, unspecified M60.9   Rhabdomyolysis M62.82   Cirrhosis of liver K74.69   Prediabetes R73.03   PCOS E28.2   Thank you for allowing [facility] pharmacy to be a part of this patient's care.  Curlene Labrum, PharmD [place] Pharmacist Office: [phone]

## 2022-05-18 NOTE — Progress Notes (Deleted)
Name: Donna Haas   MRN: [id number]    DOB: 28-Feb-1976   Date:05/18/2022       Progress Note  Subjective  Chief Complaint  Medication Refill  HPI  DMII: she was seen by Lona Millard NP in June, her A1C was at goal  At 6.6 %, she states she stopped by for A1C after her last visit but A1C not in our chart. Explained she needs to come in for A1C since it has been over 6 months. . FSBS has been between 120/130's, she is going back and forth on dose of insulin advised to only titrate every 3 days. She denies polyphagia, polydipsia or polyuria. She has tried statin therapy in the past but could not tolerate it , causes weakness/nausea and myopathy, she is willing to try Ezetimibe   Morbid obesity: she states ozempic is helping curb her appetite she thinks she is still losing weight about 5 lbs since Dec. Discussed considering SGL- 2 agonist if A1C remains below 7 %   Lupus: diagnosed years ago, no rashes at this time, feels achy and has joint aches. She used to go to [place] but not in years, Dr. Sanda Klein treated her with Plaquenil but it caused vision disturbances. She had an appointment with Dr. Posey Pronto in January 2023 but it was postponed because she was sick.   Thyroid nodule: used to see Dr. Richardson Landry and also lost to follow up, TSH within normal limits . She states Dr. Posey Pronto will check level and decide if she needs to see Endo   Back pain: she states having back problems and went to [facility] taking muscle relaxer- Zanaflex,  and could not drive it today.   Patient Active Problem List   Diagnosis Date Noted   Seasonal allergic rhinitis 01/20/2022   Migraine without aura and responsive to treatment 11/20/2020   Cervical nerve root disorder 11/20/2020   Female infertility due to ovulation failure 11/20/2020   B12 deficiency 11/20/2020   Obesity (BMI 30-39.9)    Diabetes mellitus with hyperglycemia ([place]) 06/14/2020   Anxiety, generalized 12/13/2019   Binge eating disorder 08/23/2019   Mild  intermittent asthma without complication [id number]   Hypertriglyceridemia 02/07/2016   Morbid obesity ([place]) 01/25/2016   Barrett's esophagus determined by biopsy 01/21/2016   Carpal tunnel syndrome, bilateral 01/21/2016   Hx gestational diabetes 01/21/2016   Neck pain 09/08/2012   INSOMNIA 09/21/2008   NEPHROLITHIASIS, HX OF 04/17/2008   THYROID NODULE 01/12/2008   GERD 05/30/2007   ENDOMETRIOSIS OF OTHER SPECIFIED SITES 05/30/2007   Lupus ([place]) 05/30/2007   HERNIATED LUMBAR [place] 05/30/2007   BACK PAIN 05/30/2007    Past Surgical History:  Procedure Laterality Date   APPENDECTOMY     CESAREAN SECTION     x2   CESAREAN SECTION WITH BILATERAL TUBAL LIGATION Bilateral 08/30/2015   Procedure: CESAREAN SECTION WITH BILATERAL TUBAL LIGATION;  Surgeon: Honor Loh Ward, MD;  Location: [facility] ORS;  Service: Obstetrics;  Laterality: Bilateral;   CHOLECYSTECTOMY     LAPAROSCOPY     x3   THYROID CYST EXCISION     Dr. Trena Platt, [place]    Family History  Problem Relation Age of Onset   Depression Mother        Bi-polar   Asthma Mother    Allergies Mother    Sleep apnea Mother    Allergies Sister    Diabetes Sister    Rheum arthritis Maternal Grandmother    Hypertension Paternal Grandmother  Macular degeneration Paternal Grandmother    Hyperlipidemia Other    Hypertension Other    Heart disease Other     Social History   Tobacco Use   Smoking status: Never   Smokeless tobacco: Never  Substance Use Topics   Alcohol use: No    Alcohol/week: 1.0 - 2.0 standard drink of alcohol    Types: 1 - 2 Standard drinks or equivalent per week    Comment: discontinued use during pregnancy     Current Outpatient Medications:    ACCU-CHEK GUIDE test strip, USE UP TO 4 TIMES DAILY AS DIRECTED, Disp: , Rfl:    Accu-Chek Softclix Lancets lancets, SMARTSIG:Topical 1-4 Times Daily, Disp: , Rfl:    albuterol (VENTOLIN HFA) 108 (90 Base) MCG/ACT inhaler, Inhale 2 puffs into the lungs every 6  (six) hours as needed for wheezing or shortness of breath. (Patient not taking: Reported on 01/20/2022), Disp: 8 g, Rfl: 0   ALPRAZolam (XANAX) 1 MG tablet, Take 1 mg by mouth daily as needed., Disp: , Rfl:    BD PEN NEEDLE NANO 2ND GEN 32G X 4 MM MISC, 1 DOSE BY DOES NOT APPLY ROUTE 3 (THREE) TIMES DAILY BEFORE MEALS., Disp: , Rfl:    blood glucose meter kit and supplies, Dispense based on patient and insurance preference. Use up to four times daily as directed. (FOR ICD-10 E10.9, E11.9)., Disp: 1 each, Rfl: 0   Cyanocobalamin (B-12) 1000 MCG SUBL, Place under the tongue., Disp: , Rfl:    escitalopram (LEXAPRO) 20 MG tablet, Take 10 tablets by mouth daily., Disp: , Rfl:    esomeprazole (NEXIUM) 20 MG capsule, Take 20 mg by mouth daily. , Disp: , Rfl:    ferrous sulfate 325 (65 FE) MG tablet, Take 325 mg by mouth daily., Disp: , Rfl:    insulin glargine (LANTUS SOLOSTAR) 100 UNIT/ML Solostar Pen, Inject 5-15 Units into the skin daily., Disp: 15 mL, Rfl: 1   ipratropium (ATROVENT) 0.03 % nasal spray, Place 2 sprays into both nostrils every 12 (twelve) hours., Disp: 30 mL, Rfl: 12   loratadine (CLARITIN) 10 MG tablet, Take 10 mg by mouth daily., Disp: , Rfl:    Magnesium 250 MG TABS, Take 1 tablet by mouth daily., Disp: , Rfl:    metFORMIN (GLUCOPHAGE-XR) 750 MG 24 hr tablet, TAKE 2 TABLETS (1,500 MG TOTAL) BY MOUTH EVERY DAY WITH BREAKFAST, Disp: 180 tablet, Rfl: 1   montelukast (SINGULAIR) 10 MG tablet, Take 1 tablet (10 mg total) by mouth at bedtime., Disp: 30 tablet, Rfl: 3   Semaglutide, 1 MG/DOSE, 4 MG/3ML SOPN, Inject 1 mg as directed once a week., Disp: 3 mL, Rfl: 0   zolpidem (AMBIEN) 10 MG tablet, Take 10 mg by mouth at bedtime., Disp: , Rfl:   Allergies  Allergen Reactions   Compazine [Prochlorperazine] Anaphylaxis   Prednisone Anaphylaxis   Prochlorperazine Edisylate Anaphylaxis   Etodolac     Other reaction(s): Unknown   Farxiga [Dapagliflozin]     Constipation and headaches    Hydrocodone-Acetaminophen Nausea Only   Morphine And Related Nausea Only and Other (See Comments)    Mood swings, headache    I personally reviewed active problem list, medication list, allergies, family history, social history, health maintenance with the patient/caregiver today.   ROS  ***  Objective  There were no vitals filed for this visit.  There is no height or weight on file to calculate BMI.  Physical Exam ***   PHQ2/9:    01/20/2022  9:21 AM 10/22/2021    1:03 PM 07/09/2021    8:07 AM 06/23/2021    1:06 PM 03/25/2021    2:09 PM  Depression screen PHQ 2/9  Decreased Interest 0 0 0 0 0  Down, Depressed, Hopeless 0 0 0 0 0  PHQ - 2 Score 0 0 0 0 0  Altered sleeping 0 0 0 0 0  Tired, decreased energy 0 0 0 0 0  Change in appetite 0 0 0 0 0  Feeling bad or failure about yourself  0 0 0 0 0  Trouble concentrating 0 0 0 0 0  Moving slowly or fidgety/restless 0 0 0 0 0  Suicidal thoughts 0 0 0 0 0  PHQ-9 Score 0 0 0 0 0  Difficult doing work/chores Not difficult at all Not difficult at all Not difficult at all      phq 9 is {gen pos [id number]   Fall Risk:    01/20/2022    9:21 AM 10/22/2021    1:03 PM 07/09/2021    8:07 AM 06/23/2021    1:06 PM 03/25/2021    2:08 PM  Fall Risk   Falls in the past year? 1 1 0 0 0  Number falls in past yr: 0 0 0 0 0  Injury with Fall? 0 0 0 0 0  Risk for fall due to : Impaired balance/gait Impaired balance/gait No Fall Risks No Fall Risks No Fall Risks  Follow up Falls prevention discussed;Education provided Falls prevention discussed;Education provided Falls prevention discussed Falls prevention discussed Falls prevention discussed      Functional Status Survey:      Assessment & Plan  *** There are no diagnoses linked to this encounter.

## 2022-05-18 NOTE — Progress Notes (Deleted)
Name: Donna Haas   MRN: [id number]    DOB: 1975-10-19   Date:05/18/2022       Progress Note  Subjective  Chief Complaint  Medication Refill  HPI  DMII: she was seen by Lona Millard NP in June, her A1C was at goal at 6.6 %, she states she stopped by for A1C after her last visit but A1C not in our chart. Explained she needs to come in for A1C since it has been over 6 months. . FSBS has been between 120/130's, she is going back and forth on dose of insulin advised to only titrate every 3 days. She denies polyphagia, polydipsia or polyuria. She has tried statin therapy in the past but could not tolerate it , causes weakness/nausea and myopathy, she is willing to try Ezetimibe   Morbid obesity: she states ozempic is helping curb her appetite she thinks she is still losing weight about 5 lbs since Dec. Discussed considering SGL- 2 agonist if A1C remains below 7 %   Lupus: diagnosed years ago, no rashes at this time, feels achy and has joint aches. She used to go to [place] but not in years, Dr. Sanda Klein treated her with Plaquenil but it caused vision disturbances. She had an appointment with Dr. Posey Pronto in January 2023 but it was postponed because she was sick.   Thyroid nodule: used to see Dr. Richardson Landry and also lost to follow up, TSH within normal limits . She states Dr. Posey Pronto will check level and decide if she needs to see Endo   Back pain: she states having back problems and went to [facility] taking muscle relaxer- Zanaflex,  and could not drive it today.   Patient Active Problem List   Diagnosis Date Noted   Seasonal allergic rhinitis 01/20/2022   Migraine without aura and responsive to treatment 11/20/2020   Cervical nerve root disorder 11/20/2020   Female infertility due to ovulation failure 11/20/2020   B12 deficiency 11/20/2020   Obesity (BMI 30-39.9)    Diabetes mellitus with hyperglycemia ([place]) 06/14/2020   Anxiety, generalized 12/13/2019   Binge eating disorder 08/23/2019   Mild  intermittent asthma without complication [id number]   Hypertriglyceridemia 02/07/2016   Morbid obesity ([place]) 01/25/2016   Barrett's esophagus determined by biopsy 01/21/2016   Carpal tunnel syndrome, bilateral 01/21/2016   Hx gestational diabetes 01/21/2016   Neck pain 09/08/2012   INSOMNIA 09/21/2008   NEPHROLITHIASIS, HX OF 04/17/2008   THYROID NODULE 01/12/2008   GERD 05/30/2007   ENDOMETRIOSIS OF OTHER SPECIFIED SITES 05/30/2007   Lupus ([place]) 05/30/2007   HERNIATED LUMBAR [place] 05/30/2007   BACK PAIN 05/30/2007    Past Surgical History:  Procedure Laterality Date   APPENDECTOMY     CESAREAN SECTION     x2   CESAREAN SECTION WITH BILATERAL TUBAL LIGATION Bilateral 08/30/2015   Procedure: CESAREAN SECTION WITH BILATERAL TUBAL LIGATION;  Surgeon: Honor Loh Ward, MD;  Location: [facility] ORS;  Service: Obstetrics;  Laterality: Bilateral;   CHOLECYSTECTOMY     LAPAROSCOPY     x3   THYROID CYST EXCISION     Dr. Trena Platt, [place]    Family History  Problem Relation Age of Onset   Depression Mother        Bi-polar   Asthma Mother    Allergies Mother    Sleep apnea Mother    Allergies Sister    Diabetes Sister    Rheum arthritis Maternal Grandmother    Hypertension Paternal Grandmother    Macular  degeneration Paternal Grandmother    Hyperlipidemia Other    Hypertension Other    Heart disease Other     Social History   Tobacco Use   Smoking status: Never   Smokeless tobacco: Never  Substance Use Topics   Alcohol use: No    Alcohol/week: 1.0 - 2.0 standard drink of alcohol    Types: 1 - 2 Standard drinks or equivalent per week    Comment: discontinued use during pregnancy     Current Outpatient Medications:    ACCU-CHEK GUIDE test strip, USE UP TO 4 TIMES DAILY AS DIRECTED, Disp: , Rfl:    Accu-Chek Softclix Lancets lancets, SMARTSIG:Topical 1-4 Times Daily, Disp: , Rfl:    albuterol (VENTOLIN HFA) 108 (90 Base) MCG/ACT inhaler, Inhale 2 puffs into the lungs every 6  (six) hours as needed for wheezing or shortness of breath. (Patient not taking: Reported on 01/20/2022), Disp: 8 g, Rfl: 0   ALPRAZolam (XANAX) 1 MG tablet, Take 1 mg by mouth daily as needed., Disp: , Rfl:    BD PEN NEEDLE NANO 2ND GEN 32G X 4 MM MISC, 1 DOSE BY DOES NOT APPLY ROUTE 3 (THREE) TIMES DAILY BEFORE MEALS., Disp: , Rfl:    blood glucose meter kit and supplies, Dispense based on patient and insurance preference. Use up to four times daily as directed. (FOR ICD-10 E10.9, E11.9)., Disp: 1 each, Rfl: 0   Cyanocobalamin (B-12) 1000 MCG SUBL, Place under the tongue., Disp: , Rfl:    escitalopram (LEXAPRO) 20 MG tablet, Take 10 tablets by mouth daily., Disp: , Rfl:    esomeprazole (NEXIUM) 20 MG capsule, Take 20 mg by mouth daily. , Disp: , Rfl:    ferrous sulfate 325 (65 FE) MG tablet, Take 325 mg by mouth daily., Disp: , Rfl:    insulin glargine (LANTUS SOLOSTAR) 100 UNIT/ML Solostar Pen, Inject 5-15 Units into the skin daily., Disp: 15 mL, Rfl: 1   ipratropium (ATROVENT) 0.03 % nasal spray, Place 2 sprays into both nostrils every 12 (twelve) hours., Disp: 30 mL, Rfl: 12   loratadine (CLARITIN) 10 MG tablet, Take 10 mg by mouth daily., Disp: , Rfl:    Magnesium 250 MG TABS, Take 1 tablet by mouth daily., Disp: , Rfl:    metFORMIN (GLUCOPHAGE-XR) 750 MG 24 hr tablet, TAKE 2 TABLETS (1,500 MG TOTAL) BY MOUTH EVERY DAY WITH BREAKFAST, Disp: 180 tablet, Rfl: 1   montelukast (SINGULAIR) 10 MG tablet, Take 1 tablet (10 mg total) by mouth at bedtime., Disp: 30 tablet, Rfl: 3   Semaglutide, 1 MG/DOSE, 4 MG/3ML SOPN, Inject 1 mg as directed once a week., Disp: 3 mL, Rfl: 0   zolpidem (AMBIEN) 10 MG tablet, Take 10 mg by mouth at bedtime., Disp: , Rfl:   Allergies  Allergen Reactions   Compazine [Prochlorperazine] Anaphylaxis   Prednisone Anaphylaxis   Prochlorperazine Edisylate Anaphylaxis   Etodolac     Other reaction(s): Unknown   Farxiga [Dapagliflozin]     Constipation and headaches    Hydrocodone-Acetaminophen Nausea Only   Morphine And Related Nausea Only and Other (See Comments)    Mood swings, headache    I personally reviewed active problem list, medication list, allergies, family history, social history, health maintenance with the patient/caregiver today.   ROS  ***  Objective  There were no vitals filed for this visit.  There is no height or weight on file to calculate BMI.  Physical Exam ***  No results found for this or any  previous visit (from the past 2160 hour(s)).   PHQ2/9:    01/20/2022    9:21 AM 10/22/2021    1:03 PM 07/09/2021    8:07 AM 06/23/2021    1:06 PM 03/25/2021    2:09 PM  Depression screen PHQ 2/9  Decreased Interest 0 0 0 0 0  Down, Depressed, Hopeless 0 0 0 0 0  PHQ - 2 Score 0 0 0 0 0  Altered sleeping 0 0 0 0 0  Tired, decreased energy 0 0 0 0 0  Change in appetite 0 0 0 0 0  Feeling bad or failure about yourself  0 0 0 0 0  Trouble concentrating 0 0 0 0 0  Moving slowly or fidgety/restless 0 0 0 0 0  Suicidal thoughts 0 0 0 0 0  PHQ-9 Score 0 0 0 0 0  Difficult doing work/chores Not difficult at all Not difficult at all Not difficult at all      phq 9 is {gen pos [id number]   Fall Risk:    01/20/2022    9:21 AM 10/22/2021    1:03 PM 07/09/2021    8:07 AM 06/23/2021    1:06 PM 03/25/2021    2:08 PM  Fall Risk   Falls in the past year? 1 1 0 0 0  Number falls in past yr: 0 0 0 0 0  Injury with Fall? 0 0 0 0 0  Risk for fall due to : Impaired balance/gait Impaired balance/gait No Fall Risks No Fall Risks No Fall Risks  Follow up Falls prevention discussed;Education provided Falls prevention discussed;Education provided Falls prevention discussed Falls prevention discussed Falls prevention discussed      Functional Status Survey:      Assessment & Plan  *** There are no diagnoses linked to this encounter.

## 2022-05-18 NOTE — Progress Notes (Deleted)
Name: Donna Haas   MRN: [id number]    DOB: Nov 17, 1975   Date:05/18/2022       Progress Note  Subjective  Chief Complaint  Medication Refill  I connected with  Donna Haas  on 05/18/22 at  2:40 PM EST by a video enabled telemedicine application and verified that I am speaking with the correct person using two identifiers.  I discussed the limitations of evaluation and management by telemedicine and the availability of in person appointments. The patient expressed understanding and agreed to proceed with the virtual visit  Staff also discussed with the patient that there may be a patient responsible charge related to this service. Patient Location: *** Provider Location: *** Additional Individuals present: ***  HPI  DMII: she was seen by Lona Millard NP in June, her A1C was at goal at 6.6 %, she states she stopped by for A1C after her last visit but A1C not in our chart. Explained she needs to come in for A1C since it has been over 6 months. . FSBS has been between 120/130's, she is going back and forth on dose of insulin advised to only titrate every 3 days. She denies polyphagia, polydipsia or polyuria. She has tried statin therapy in the past but could not tolerate it , causes weakness/nausea and myopathy, she is willing to try Ezetimibe   Morbid obesity: she states ozempic is helping curb her appetite she thinks she is still losing weight about 5 lbs since Dec. Discussed considering SGL- 2 agonist if A1C remains below 7 %   Lupus: diagnosed years ago, no rashes at this time, feels achy and has joint aches. She used to go to [place] but not in years, Dr. Sanda Klein treated her with Plaquenil but it caused vision disturbances. She had an appointment with Dr. Posey Pronto in January 2023 but it was postponed because she was sick.   Thyroid nodule: used to see Dr. Richardson Landry and also lost to follow up, TSH within normal limits . She states Dr. Posey Pronto will check level and decide if she needs to see Endo    Back pain: she states having back problems and went to [facility] taking muscle relaxer- Zanaflex,  and could not drive it today.   Patient Active Problem List   Diagnosis Date Noted   Seasonal allergic rhinitis 01/20/2022   Migraine without aura and responsive to treatment 11/20/2020   Cervical nerve root disorder 11/20/2020   Female infertility due to ovulation failure 11/20/2020   B12 deficiency 11/20/2020   Obesity (BMI 30-39.9)    Diabetes mellitus with hyperglycemia ([place]) 06/14/2020   Anxiety, generalized 12/13/2019   Binge eating disorder 08/23/2019   Mild intermittent asthma without complication 82/50/5397   Hypertriglyceridemia 02/07/2016   Morbid obesity ([place]) 01/25/2016   Barrett's esophagus determined by biopsy 01/21/2016   Carpal tunnel syndrome, bilateral 01/21/2016   Hx gestational diabetes 01/21/2016   Neck pain 09/08/2012   INSOMNIA 09/21/2008   NEPHROLITHIASIS, HX OF 04/17/2008   THYROID NODULE 01/12/2008   GERD 05/30/2007   ENDOMETRIOSIS OF OTHER SPECIFIED SITES 05/30/2007   Lupus ([place]) 05/30/2007   HERNIATED LUMBAR [place] 05/30/2007   BACK PAIN 05/30/2007    Past Surgical History:  Procedure Laterality Date   APPENDECTOMY     CESAREAN SECTION     x2   CESAREAN SECTION WITH BILATERAL TUBAL LIGATION Bilateral 08/30/2015   Procedure: CESAREAN SECTION WITH BILATERAL TUBAL LIGATION;  Surgeon: Honor Loh Ward, MD;  Location: [facility] ORS;  Service: Obstetrics;  Laterality: Bilateral;   CHOLECYSTECTOMY     LAPAROSCOPY     x3   THYROID CYST EXCISION     Dr. Trena Platt, [place]    Family History  Problem Relation Age of Onset   Depression Mother        Bi-polar   Asthma Mother    Allergies Mother    Sleep apnea Mother    Allergies Sister    Diabetes Sister    Rheum arthritis Maternal Grandmother    Hypertension Paternal Grandmother    Macular degeneration Paternal Grandmother    Hyperlipidemia Other    Hypertension Other    Heart disease Other      Social History   Socioeconomic History   Marital status: Divorced    Spouse name: Not on file   Number of children: 2   Years of education: Not on file   Highest education level: Associate degree: academic program  Occupational History   Occupation: disability    Employer: UNEMPLOYED  Tobacco Use   Smoking status: Never   Smokeless tobacco: Never  Vaping Use   Vaping Use: Never used  Substance and Sexual Activity   Alcohol use: No    Alcohol/week: 1.0 - 2.0 standard drink of alcohol    Types: 1 - 2 Standard drinks or equivalent per week    Comment: discontinued use during pregnancy   Drug use: No   Sexual activity: Not Currently    Partners: Male  Other Topics Concern   Not on file  Social History Narrative   Not on file   Social Determinants of Health   Financial Resource Strain: Low Risk  (01/14/2021)   Overall Financial Resource Strain (CARDIA)    Difficulty of Paying Living Expenses: Not hard at all  Food Insecurity: No Food Insecurity (01/14/2021)   Hunger Vital Sign    Worried About Running Out of Food in the Last Year: Never true    [place] in the Last Year: Never true  Transportation Needs: No Transportation Needs (01/14/2021)   PRAPARE - Hydrologist (Medical): No    Lack of Transportation (Non-Medical): No  Physical Activity: Sufficiently Active (01/14/2021)   Exercise Vital Sign    Days of Exercise per Week: 5 days    Minutes of Exercise per Session: 30 min  Stress: No Stress Concern Present (01/14/2021)   [place]    Feeling of Stress : Not at all  Social Connections: Moderately Integrated (01/14/2021)   Social Connection and Isolation Panel [NHANES]    Frequency of Communication with Friends and Family: More than three times a week    Frequency of Social Gatherings with Friends and Family: More than three times a week    Attends Religious Services:  More than 4 times per year    Active Member of [employer] or Organizations: Yes    Attends Music therapist: More than 4 times per year    Marital Status: Divorced  Intimate Partner Violence: Not At Risk (01/14/2021)   Humiliation, Afraid, Rape, and Kick questionnaire    Fear of Current or Ex-Partner: No    Emotionally Abused: No    Physically Abused: No    Sexually Abused: No     Current Outpatient Medications:    ACCU-CHEK GUIDE test strip, USE UP TO 4 TIMES DAILY AS DIRECTED, Disp: , Rfl:    Accu-Chek Softclix Lancets lancets, SMARTSIG:Topical 1-4 Times  Daily, Disp: , Rfl:    albuterol (VENTOLIN HFA) 108 (90 Base) MCG/ACT inhaler, Inhale 2 puffs into the lungs every 6 (six) hours as needed for wheezing or shortness of breath. (Patient not taking: Reported on 01/20/2022), Disp: 8 g, Rfl: 0   ALPRAZolam (XANAX) 1 MG tablet, Take 1 mg by mouth daily as needed., Disp: , Rfl:    BD PEN NEEDLE NANO 2ND GEN 32G X 4 MM MISC, 1 DOSE BY DOES NOT APPLY ROUTE 3 (THREE) TIMES DAILY BEFORE MEALS., Disp: , Rfl:    blood glucose meter kit and supplies, Dispense based on patient and insurance preference. Use up to four times daily as directed. (FOR ICD-10 E10.9, E11.9)., Disp: 1 each, Rfl: 0   Cyanocobalamin (B-12) 1000 MCG SUBL, Place under the tongue., Disp: , Rfl:    escitalopram (LEXAPRO) 20 MG tablet, Take 10 tablets by mouth daily., Disp: , Rfl:    esomeprazole (NEXIUM) 20 MG capsule, Take 20 mg by mouth daily. , Disp: , Rfl:    ferrous sulfate 325 (65 FE) MG tablet, Take 325 mg by mouth daily., Disp: , Rfl:    insulin glargine (LANTUS SOLOSTAR) 100 UNIT/ML Solostar Pen, Inject 5-15 Units into the skin daily., Disp: 15 mL, Rfl: 1   ipratropium (ATROVENT) 0.03 % nasal spray, Place 2 sprays into both nostrils every 12 (twelve) hours., Disp: 30 mL, Rfl: 12   loratadine (CLARITIN) 10 MG tablet, Take 10 mg by mouth daily., Disp: , Rfl:    Magnesium 250 MG TABS, Take 1 tablet by mouth daily.,  Disp: , Rfl:    metFORMIN (GLUCOPHAGE-XR) 750 MG 24 hr tablet, TAKE 2 TABLETS (1,500 MG TOTAL) BY MOUTH EVERY DAY WITH BREAKFAST, Disp: 180 tablet, Rfl: 1   montelukast (SINGULAIR) 10 MG tablet, Take 1 tablet (10 mg total) by mouth at bedtime., Disp: 30 tablet, Rfl: 3   Semaglutide, 1 MG/DOSE, 4 MG/3ML SOPN, Inject 1 mg as directed once a week., Disp: 3 mL, Rfl: 0   zolpidem (AMBIEN) 10 MG tablet, Take 10 mg by mouth at bedtime., Disp: , Rfl:   Allergies  Allergen Reactions   Compazine [Prochlorperazine] Anaphylaxis   Prednisone Anaphylaxis   Prochlorperazine Edisylate Anaphylaxis   Etodolac     Other reaction(s): Unknown   Farxiga [Dapagliflozin]     Constipation and headaches   Hydrocodone-Acetaminophen Nausea Only   Morphine And Related Nausea Only and Other (See Comments)    Mood swings, headache    I personally reviewed active problem list, medication list, allergies, family history, social history, health maintenance with the patient/caregiver today.   ROS ***  Objective  Virtual encounter, vitals not obtained.  There is no height or weight on file to calculate BMI.  Physical Exam ***  No results found for this or any previous visit (from the past 72 hour(s)).  PHQ2/9:    01/20/2022    9:21 AM 10/22/2021    1:03 PM 07/09/2021    8:07 AM 06/23/2021    1:06 PM 03/25/2021    2:09 PM  Depression screen PHQ 2/9  Decreased Interest 0 0 0 0 0  Down, Depressed, Hopeless 0 0 0 0 0  PHQ - 2 Score 0 0 0 0 0  Altered sleeping 0 0 0 0 0  Tired, decreased energy 0 0 0 0 0  Change in appetite 0 0 0 0 0  Feeling bad or failure about yourself  0 0 0 0 0  Trouble concentrating 0 0 0 0  0  Moving slowly or fidgety/restless 0 0 0 0 0  Suicidal thoughts 0 0 0 0 0  PHQ-9 Score 0 0 0 0 0  Difficult doing work/chores Not difficult at all Not difficult at all Not difficult at all     PHQ-2/9 Result is {gen negative/positive:[id number]}.    Fall Risk:    01/20/2022    9:21 AM  10/22/2021    1:03 PM 07/09/2021    8:07 AM 06/23/2021    1:06 PM 03/25/2021    2:08 PM  Fall Risk   Falls in the past year? 1 1 0 0 0  Number falls in past yr: 0 0 0 0 0  Injury with Fall? 0 0 0 0 0  Risk for fall due to : Impaired balance/gait Impaired balance/gait No Fall Risks No Fall Risks No Fall Risks  Follow up Falls prevention discussed;Education provided Falls prevention discussed;Education provided Falls prevention discussed Falls prevention discussed Falls prevention discussed     Assessment & Plan  There are no diagnoses linked to this encounter.  I discussed the assessment and treatment plan with the patient. The patient was provided an opportunity to ask questions and all were answered. The patient agreed with the plan and demonstrated an understanding of the instructions.  The patient was advised to call back or seek an in-person evaluation if the symptoms worsen or if the condition fails to improve as anticipated.  I provided *** minutes of non-face-to-face time during this encounter.

## 2022-05-19 ENCOUNTER — Ambulatory Visit: Payer: Medicare Other | Admitting: Family Medicine

## 2022-05-19 DIAGNOSIS — E1169 Type 2 diabetes mellitus with other specified complication: Secondary | ICD-10-CM

## 2022-05-19 NOTE — Progress Notes (Signed)
Name: Donna Haas   MRN: [id number]    DOB: [birth year]   Date:05/20/2022       Progress Note  Subjective  Chief Complaint  Medication Refill  HPI  DMII: She is now off Insulin, taking Ozempic 1 mg and Metformin two daily, no hypoglycemic episodes but could not tolerate [place] that triggered migraine and unable to eat anything  FSBS has been between 90-110 fasting. She denies polyphagia, polydipsia or polyuria. She has tried statin therapy in the past but could not tolerate it , causes weakness/nausea and myopathy, we will try to send Zetia   Morbid obesity: she states ozempic is helping curb her appetite she thinks, weigh is now down to 180 lbs and BMI barely above 35. Continue to do life style modification. Eating smaller portions and has been more active lately   Lupus Systemic : diagnosed years ago, no rashes at this time, feels achy and has joint aches. She used to go to [place] but not in years, Dr. Sanda Klein treated her with Plaquenil but it caused vision disturbances. She had an appointment with Dr. Posey Pronto in January 2023 but she was sick and could not keep appointment, she was sick again for second appointment. Advised her to contact them and  re-schedule  History of Barrett's: last EGD was in 2009 per our records, she thinks she had another one done in 2017. She is taking Nexium 20 mg otc and symptoms of reflux are controlled   Thyroid nodule: used to see Dr. Richardson Landry and also lost to follow up, TSH within normal limits .  IBS: diarrhea type, controlled with diet.   Menorrhagia/endometriosis: she is due for pap smear, she is taking iron pills. Discussed last labs and we will recheck on her next visit   GAD/OCD/Dysthymia: under the care of Dr. Toy Care and stable  Patient Active Problem List   Diagnosis Date Noted   Seasonal allergic rhinitis 01/20/2022   Migraine without aura and responsive to treatment 11/20/2020   Cervical nerve root disorder 11/20/2020   Female infertility due to  ovulation failure 11/20/2020   B12 deficiency 11/20/2020   Obesity (BMI 30-39.9)    Diabetes mellitus with hyperglycemia ([place]) 06/14/2020   Anxiety, generalized 12/13/2019   Binge eating disorder 08/23/2019   Mild intermittent asthma without complication 10/93/2355   Hypertriglyceridemia 02/07/2016   Morbid obesity ([place]) 01/25/2016   Barrett's esophagus determined by biopsy 01/21/2016   Carpal tunnel syndrome, bilateral 01/21/2016   Hx gestational diabetes 01/21/2016   Neck pain 09/08/2012   INSOMNIA 09/21/2008   NEPHROLITHIASIS, HX OF 04/17/2008   THYROID NODULE 01/12/2008   GERD 05/30/2007   ENDOMETRIOSIS OF OTHER SPECIFIED SITES 05/30/2007   Lupus ([place]) 05/30/2007   HERNIATED LUMBAR [place] 05/30/2007   BACK PAIN 05/30/2007    Past Surgical History:  Procedure Laterality Date   APPENDECTOMY     CESAREAN SECTION     x2   CESAREAN SECTION WITH BILATERAL TUBAL LIGATION Bilateral 08/30/2015   Procedure: CESAREAN SECTION WITH BILATERAL TUBAL LIGATION;  Surgeon: Honor Loh Ward, MD;  Location: [facility] ORS;  Service: Obstetrics;  Laterality: Bilateral;   CHOLECYSTECTOMY     LAPAROSCOPY     x3   THYROID CYST EXCISION     Dr. Trena Platt, [place]    Family History  Problem Relation Age of Onset   Depression Mother        Bi-polar   Asthma Mother    Allergies Mother    Sleep apnea Mother  Allergies Sister    Diabetes Sister    Rheum arthritis Maternal Grandmother    Hypertension Paternal Grandmother    Macular degeneration Paternal Grandmother    Hyperlipidemia Other    Hypertension Other    Heart disease Other     Social History   Tobacco Use   Smoking status: Never   Smokeless tobacco: Never  Substance Use Topics   Alcohol use: No    Alcohol/week: 1.0 - 2.0 standard drink of alcohol    Types: 1 - 2 Standard drinks or equivalent per week    Comment: discontinued use during pregnancy     Current Outpatient Medications:    ACCU-CHEK GUIDE test strip, USE UP TO 4  TIMES DAILY AS DIRECTED, Disp: , Rfl:    Accu-Chek Softclix Lancets lancets, SMARTSIG:Topical 1-4 Times Daily, Disp: , Rfl:    ALPRAZolam (XANAX) 1 MG tablet, Take 1 mg by mouth daily as needed., Disp: , Rfl:    BD PEN NEEDLE NANO 2ND GEN 32G X 4 MM MISC, 1 DOSE BY DOES NOT APPLY ROUTE 3 (THREE) TIMES DAILY BEFORE MEALS., Disp: , Rfl:    blood glucose meter kit and supplies, Dispense based on patient and insurance preference. Use up to four times daily as directed. (FOR ICD-10 E10.9, E11.9)., Disp: 1 each, Rfl: 0   Cyanocobalamin (B-12) 1000 MCG SUBL, Place under the tongue., Disp: , Rfl:    escitalopram (LEXAPRO) 20 MG tablet, Take 10 tablets by mouth daily., Disp: , Rfl:    esomeprazole (NEXIUM) 20 MG capsule, Take 20 mg by mouth daily. , Disp: , Rfl:    ferrous sulfate 325 (65 FE) MG tablet, Take 325 mg by mouth daily., Disp: , Rfl:    insulin glargine (LANTUS SOLOSTAR) 100 UNIT/ML Solostar Pen, Inject 5-15 Units into the skin daily., Disp: 15 mL, Rfl: 1   ipratropium (ATROVENT) 0.03 % nasal spray, Place 2 sprays into both nostrils every 12 (twelve) hours., Disp: 30 mL, Rfl: 12   loratadine (CLARITIN) 10 MG tablet, Take 10 mg by mouth daily., Disp: , Rfl:    Magnesium 250 MG TABS, Take 1 tablet by mouth daily., Disp: , Rfl:    metFORMIN (GLUCOPHAGE-XR) 750 MG 24 hr tablet, TAKE 2 TABLETS (1,500 MG TOTAL) BY MOUTH EVERY DAY WITH BREAKFAST, Disp: 180 tablet, Rfl: 1   montelukast (SINGULAIR) 10 MG tablet, Take 1 tablet (10 mg total) by mouth at bedtime., Disp: 30 tablet, Rfl: 3   Semaglutide, 1 MG/DOSE, 4 MG/3ML SOPN, Inject 1 mg as directed once a week., Disp: 3 mL, Rfl: 0   zolpidem (AMBIEN) 10 MG tablet, Take 10 mg by mouth at bedtime., Disp: , Rfl:    albuterol (VENTOLIN HFA) 108 (90 Base) MCG/ACT inhaler, Inhale 2 puffs into the lungs every 6 (six) hours as needed for wheezing or shortness of breath. (Patient not taking: Reported on 05/20/2022), Disp: 8 g, Rfl: 0  Allergies  Allergen Reactions    Compazine [Prochlorperazine] Anaphylaxis   Prednisone Anaphylaxis   Prochlorperazine Edisylate Anaphylaxis   Etodolac     Other reaction(s): Unknown   Farxiga [Dapagliflozin]     Constipation and headaches   Hydrocodone-Acetaminophen Nausea Only   Morphine And Related Nausea Only and Other (See Comments)    Mood swings, headache    I personally reviewed active problem list, medication list, allergies, family history, social history, health maintenance with the patient/caregiver today.   ROS  Ten systems reviewed and is negative except as mentioned in HPI  Objective  Vitals:   05/20/22 1346  BP: 118/76  Pulse: 100  Resp: 16  Temp: 98 F (36.7 C)  TempSrc: Oral  SpO2: 99%  Weight: 180 lb 4.8 oz (81.8 kg)  Height: 5' (1.524 m)    Body mass index is 35.21 kg/m.  Physical Exam  Constitutional: Patient appears well-developed and well-nourished. Obese  No distress.  HEENT: head atraumatic, normocephalic, pupils equal and reactive to light, neck supple Cardiovascular: Normal rate, regular rhythm and normal heart sounds.  No murmur heard. No BLE edema. Pulmonary/Chest: Effort normal and breath sounds normal. No respiratory distress. Abdominal: Soft.  There is no tenderness. Psychiatric: Patient has a normal mood and affect. behavior is normal. Judgment and thought content normal.   Recent Results (from the past 2160 hour(s))  HM MAMMOGRAPHY     Status: None   Collection Time: 02/26/22 12:00 AM  Result Value Ref Range   HM Mammogram 0-4 Bi-Rad 0-4 Bi-Rad, Self Reported Normal  POCT HgB A1C     Status: Abnormal   Collection Time: 05/20/22  1:48 PM  Result Value Ref Range   Hemoglobin A1C 6.1 (A) 4.0 - 5.6 %   HbA1c POC (<> result, manual entry)     HbA1c, POC (prediabetic range)     HbA1c, POC (controlled diabetic range)      PHQ2/9:    05/20/2022    1:47 PM 01/20/2022    9:21 AM 10/22/2021    1:03 PM 07/09/2021    8:07 AM 06/23/2021    1:06 PM  Depression  screen PHQ 2/9  Decreased Interest 0 0 0 0 0  Down, Depressed, Hopeless 0 0 0 0 0  PHQ - 2 Score 0 0 0 0 0  Altered sleeping 0 0 0 0 0  Tired, decreased energy 0 0 0 0 0  Change in appetite 0 0 0 0 0  Feeling bad or failure about yourself  0 0 0 0 0  Trouble concentrating 0 0 0 0 0  Moving slowly or fidgety/restless 0 0 0 0 0  Suicidal thoughts 0 0 0 0 0  PHQ-9 Score 0 0 0 0 0  Difficult doing work/chores  Not difficult at all Not difficult at all Not difficult at all     phq 9 is negative   Fall Risk:    05/20/2022    1:47 PM 01/20/2022    9:21 AM 10/22/2021    1:03 PM 07/09/2021    8:07 AM 06/23/2021    1:06 PM  Fall Risk   Falls in the past year? 0 1 1 0 0  Number falls in past yr:  0 0 0 0  Injury with Fall?  0 0 0 0  Risk for fall due to : No Fall Risks Impaired balance/gait Impaired balance/gait No Fall Risks No Fall Risks  Follow up Falls prevention discussed Falls prevention discussed;Education provided Falls prevention discussed;Education provided Falls prevention discussed Falls prevention discussed      Functional Status Survey: Is the patient deaf or have difficulty hearing?: No Does the patient have difficulty seeing, even when wearing glasses/contacts?: No Does the patient have difficulty concentrating, remembering, or making decisions?: No Does the patient have difficulty walking or climbing stairs?: No Does the patient have difficulty dressing or bathing?: No Does the patient have difficulty doing errands alone such as visiting a doctor's office or shopping?: No    Assessment & Plan  1. Dyslipidemia associated with type 2 diabetes mellitus ([place])  -  POCT HgB A1C - ezetimibe (ZETIA) 10 MG tablet; Take 1 tablet (10 mg total) by mouth daily.  Dispense: 90 tablet; Refill: 3  2. Morbid obesity ([place])  Discussed with the patient the risk posed by an increased BMI. Discussed importance of portion control, calorie counting and at least 150 minutes of physical  activity weekly. Avoid sweet beverages and drink more water. Eat at least 6 servings of fruit and vegetables daily    3. Systemic lupus erythematosus, unspecified SLE type, unspecified organ involvement status ([place])  - Ambulatory referral to Rheumatology  4. Statin myopathy  Start Zetia  5. Irritable bowel syndrome with diarrhea  Stable  6. B12 deficiency  Continue supplementation  7. Iron deficiency anemia, unspecified iron deficiency anemia type  Continue iron supplementation , we will recheck level during her next visit  8. Seasonal allergic rhinitis, unspecified trigger   9. Barrett's esophagus determined by biopsy  - Ambulatory referral to Gastroenterology  10. Colon cancer screening  - Ambulatory referral to Gastroenterology

## 2022-05-20 ENCOUNTER — Ambulatory Visit (INDEPENDENT_AMBULATORY_CARE_PROVIDER_SITE_OTHER): Payer: 59 | Admitting: Family Medicine

## 2022-05-20 ENCOUNTER — Encounter: Payer: Self-pay | Admitting: Family Medicine

## 2022-05-20 VITALS — BP 118/76 | HR 100 | Temp 98.0°F | Resp 16 | Ht 60.0 in | Wt 180.3 lb

## 2022-05-20 DIAGNOSIS — E1169 Type 2 diabetes mellitus with other specified complication: Secondary | ICD-10-CM | POA: Diagnosis not present

## 2022-05-20 DIAGNOSIS — M329 Systemic lupus erythematosus, unspecified: Secondary | ICD-10-CM | POA: Diagnosis not present

## 2022-05-20 DIAGNOSIS — E538 Deficiency of other specified B group vitamins: Secondary | ICD-10-CM | POA: Diagnosis not present

## 2022-05-20 DIAGNOSIS — K2270 Barrett's esophagus without dysplasia: Secondary | ICD-10-CM | POA: Diagnosis not present

## 2022-05-20 DIAGNOSIS — J302 Other seasonal allergic rhinitis: Secondary | ICD-10-CM | POA: Diagnosis not present

## 2022-05-20 DIAGNOSIS — K580 Irritable bowel syndrome with diarrhea: Secondary | ICD-10-CM | POA: Diagnosis not present

## 2022-05-20 DIAGNOSIS — T466X5A Adverse effect of antihyperlipidemic and antiarteriosclerotic drugs, initial encounter: Secondary | ICD-10-CM | POA: Diagnosis not present

## 2022-05-20 DIAGNOSIS — D509 Iron deficiency anemia, unspecified: Secondary | ICD-10-CM

## 2022-05-20 DIAGNOSIS — Z1211 Encounter for screening for malignant neoplasm of colon: Secondary | ICD-10-CM | POA: Diagnosis not present

## 2022-05-20 DIAGNOSIS — E785 Hyperlipidemia, unspecified: Secondary | ICD-10-CM

## 2022-05-20 DIAGNOSIS — G720 Drug-induced myopathy: Secondary | ICD-10-CM | POA: Diagnosis not present

## 2022-05-20 LAB — POCT GLYCOSYLATED HEMOGLOBIN (HGB A1C): Hemoglobin A1C: 6.1 % — AB (ref 4.0–5.6)

## 2022-05-20 MED ORDER — EZETIMIBE 10 MG PO TABS: 10.0000 mg | ORAL_TABLET | Freq: Every day | ORAL | 3 refills | Status: DC

## 2022-05-20 MED ORDER — SEMAGLUTIDE (2 MG/DOSE) 8 MG/3ML ~~LOC~~ SOPN: 2.0000 mg | PEN_INJECTOR | SUBCUTANEOUS | 0 refills | Status: DC

## 2022-07-21 NOTE — Progress Notes (Deleted)
Name: Donna Haas   MRN: [id number]    DOB: [birth year]   Date:07/21/2022       Progress Note  Subjective  Chief Complaint  Annual Exam  HPI  Patient presents for annual CPE.  Diet: *** Exercise: ***  Last Eye Exam: *** Last Dental Exam: ***  Flowsheet Row Office Visit from 05/20/2022 in [facility]  AUDIT-C Score 1      Depression: Phq 9 is  {Desc; negative/positive:13464}    05/20/2022    1:47 PM 01/20/2022    9:21 AM 10/22/2021    1:03 PM 07/09/2021    8:07 AM 06/23/2021    1:06 PM  Depression screen PHQ 2/9  Decreased Interest 0 0 0 0 0  Down, Depressed, Hopeless 0 0 0 0 0  PHQ - 2 Score 0 0 0 0 0  Altered sleeping 0 0 0 0 0  Tired, decreased energy 0 0 0 0 0  Change in appetite 0 0 0 0 0  Feeling bad or failure about yourself  0 0 0 0 0  Trouble concentrating 0 0 0 0 0  Moving slowly or fidgety/restless 0 0 0 0 0  Suicidal thoughts 0 0 0 0 0  PHQ-9 Score 0 0 0 0 0  Difficult doing work/chores  Not difficult at all Not difficult at all Not difficult at all    Hypertension: BP Readings from Last 3 Encounters:  05/20/22 118/76  01/20/22 116/82  11/20/20 130/84   Obesity: Wt Readings from Last 3 Encounters:  05/20/22 180 lb 4.8 oz (81.8 kg)  01/20/22 186 lb (84.4 kg)  11/20/20 198 lb (89.8 kg)   BMI Readings from Last 3 Encounters:  05/20/22 35.21 kg/m  01/20/22 36.33 kg/m  11/20/20 38.67 kg/m     Vaccines:   HPV: N/A Tdap: up to date Shingrix:N/A  Pneumonia: N/A Flu: up to date COVID-19: up to date   Hep C Screening: 06/21/07 STD testing and prevention (HIV/chl/gon/syphilis): 06/14/20 Intimate partner violence: negative screen  Sexual History : Menstrual History/LMP/Abnormal Bleeding:  Discussed importance of follow up if any post-menopausal bleeding: yes  Incontinence Symptoms: negative for symptoms   Breast cancer:  - Last Mammogram: 02/26/22 - BRCA gene screening: N/A  Osteoporosis Prevention : Discussed high  calcium and vitamin D supplementation, weight bearing exercises Bone density: N/A   Cervical cancer screening: 01/03/15  Skin cancer: Discussed monitoring for atypical lesions  Colorectal cancer: N/A   Lung cancer:  Low Dose CT Chest recommended if Age 29-80 years, 20 pack-year currently smoking OR have quit w/in 15years. Patient does not qualify for screen   ECG: 03/23/17  Advanced Care Planning: A voluntary discussion about advance care planning including the explanation and discussion of advance directives.  Discussed health care proxy and Living will, and the patient was able to identify a health care proxy as ***.  Patient does not have a living will and power of attorney of health care   Lipids: Lab Results  Component Value Date   CHOL 187 01/20/2022   CHOL 191 10/23/2020   CHOL 196 07/03/2020   Lab Results  Component Value Date   HDL 41 (L) 01/20/2022   HDL 43 (L) 10/23/2020   HDL 46 (L) 07/03/2020   Lab Results  Component Value Date   LDLCALC 101 (H) 01/20/2022   LDLCALC 114 (H) 10/23/2020   LDLCALC 102 (H) 07/03/2020   Lab Results  Component Value Date   TRIG 336 (H) 01/20/2022  TRIG 227 (H) 10/23/2020   TRIG 363 (H) 07/03/2020   Lab Results  Component Value Date   CHOLHDL 4.6 01/20/2022   CHOLHDL 4.4 10/23/2020   CHOLHDL 4.3 07/03/2020   Lab Results  Component Value Date   LDLDIRECT 132.4 05/30/2007    Glucose: Glucose, Bld  Date Value Ref Range Status  01/20/2022 110 (H) 65 - 99 mg/dL Final    Comment:    .            Fasting reference interval . For someone without known diabetes, a glucose value between 100 and 125 mg/dL is consistent with prediabetes and should be confirmed with a follow-up test. .   10/23/2020 159 (H) 65 - 99 mg/dL Final    Comment:    .            Fasting reference interval . For someone without known diabetes, a glucose value >125 mg/dL indicates that they may have diabetes and this should be confirmed with  a follow-up test. .   06/15/2020 467 (H) 70 - 99 mg/dL Final    Comment:    Glucose reference range applies only to samples taken after fasting for at least 8 hours. Performed at [facility], [address] [place], [place] [address]    Glucose-Capillary  Date Value Ref Range Status  06/17/2020 266 (H) 70 - 99 mg/dL Final    Comment:    Glucose reference range applies only to samples taken after fasting for at least 8 hours.  06/16/2020 395 (H) 70 - 99 mg/dL Final    Comment:    Glucose reference range applies only to samples taken after fasting for at least 8 hours.  06/16/2020 366 (H) 70 - 99 mg/dL Final    Comment:    Glucose reference range applies only to samples taken after fasting for at least 8 hours.    Patient Active Problem List   Diagnosis Date Noted   Irritable bowel syndrome with diarrhea 05/20/2022   Statin myopathy 05/20/2022   Seasonal allergic rhinitis 01/20/2022   Migraine without aura and responsive to treatment 11/20/2020   Cervical nerve root disorder 11/20/2020   B12 deficiency 11/20/2020   Dyslipidemia associated with type 2 diabetes mellitus 06/14/2020   Anxiety, generalized 12/13/2019   Binge eating disorder 08/23/2019   Mild intermittent asthma without complication 08/23/2019   Morbid obesity 01/25/2016   Barrett's esophagus determined by biopsy 01/21/2016   Carpal tunnel syndrome, bilateral 01/21/2016   Hx gestational diabetes 01/21/2016   Neck pain 09/08/2012   INSOMNIA 09/21/2008   NEPHROLITHIASIS, HX OF 04/17/2008   THYROID NODULE 01/12/2008   GERD 05/30/2007   ENDOMETRIOSIS OF OTHER SPECIFIED SITES 05/30/2007   Systemic lupus erythematosus 05/30/2007   HERNIATED LUMBAR DISC 05/30/2007   BACK PAIN 05/30/2007    Past Surgical History:  Procedure Laterality Date   APPENDECTOMY     CESAREAN SECTION     x2   CESAREAN SECTION WITH BILATERAL TUBAL LIGATION Bilateral 08/30/2015   Procedure: CESAREAN SECTION WITH BILATERAL  TUBAL LIGATION;  Surgeon: Elenora Fender Ward, MD;  Location: [facility] ORS;  Service: Obstetrics;  Laterality: Bilateral;   CHOLECYSTECTOMY     LAPAROSCOPY     x3   THYROID CYST EXCISION     Dr. Rolly Salter, [place]    Family History  Problem Relation Age of Onset   Depression Mother        Bi-polar   Asthma Mother    Allergies Mother  Sleep apnea Mother    Allergies Sister    Diabetes Sister    Rheum arthritis Maternal Grandmother    Hypertension Paternal Grandmother    Macular degeneration Paternal Grandmother    Hyperlipidemia Other    Hypertension Other    Heart disease Other     Social History   Socioeconomic History   Marital status: Divorced    Spouse name: Not on file   Number of children: 2   Years of education: Not on file   Highest education level: Associate degree: academic program  Occupational History   Occupation: disability    Employer: UNEMPLOYED  Tobacco Use   Smoking status: Never   Smokeless tobacco: Never  Vaping Use   Vaping Use: Never used  Substance and Sexual Activity   Alcohol use: No    Alcohol/week: 1.0 - 2.0 standard drink of alcohol    Types: 1 - 2 Standard drinks or equivalent per week    Comment: discontinued use during pregnancy   Drug use: No   Sexual activity: Not Currently    Partners: Male  Other Topics Concern   Not on file  Social History Narrative   Not on file   Social Determinants of Health   Financial Resource Strain: Low Risk  (01/14/2021)   Overall Financial Resource Strain (CARDIA)    Difficulty of Paying Living Expenses: Not hard at all  Food Insecurity: No Food Insecurity (01/14/2021)   Hunger Vital Sign    Worried About Running Out of Food in the Last Year: Never true    Ran Out of Food in the Last Year: Never true  Transportation Needs: No Transportation Needs (01/14/2021)   PRAPARE - Administrator, Civil ServiceTransportation    Lack of Transportation (Medical): No    Lack of Transportation (Non-Medical): No  Physical Activity: Sufficiently  Active (01/14/2021)   Exercise Vital Sign    Days of Exercise per Week: 5 days    Minutes of Exercise per Session: 30 min  Stress: No Stress Concern Present (01/14/2021)   [employer] - Occupational Stress Questionnaire    Feeling of Stress : Not at all  Social Connections: Moderately Integrated (01/14/2021)   Social Connection and Isolation Panel [NHANES]    Frequency of Communication with Friends and Family: More than three times a week    Frequency of Social Gatherings with Friends and Family: More than three times a week    Attends Religious Services: More than 4 times per year    Active Member of [employer]ubs or Organizations: Yes    Attends Engineer, structuralClub or Organization Meetings: More than 4 times per year    Marital Status: Divorced  Intimate Partner Violence: Not At Risk (01/14/2021)   Humiliation, Afraid, Rape, and Kick questionnaire    Fear of Current or Ex-Partner: No    Emotionally Abused: No    Physically Abused: No    Sexually Abused: No     Current Outpatient Medications:    ACCU-CHEK GUIDE test strip, USE UP TO 4 TIMES DAILY AS DIRECTED, Disp: , Rfl:    Accu-Chek Softclix Lancets lancets, SMARTSIG:Topical 1-4 Times Daily, Disp: , Rfl:    ALPRAZolam (XANAX) 1 MG tablet, Take 1 mg by mouth daily as needed., Disp: , Rfl:    blood glucose meter kit and supplies, Dispense based on patient and insurance preference. Use up to four times daily as directed. (FOR ICD-10 E10.9, E11.9)., Disp: 1 each, Rfl: 0   Cyanocobalamin (B-12) 1000 MCG SUBL, Place under the  tongue., Disp: , Rfl:    escitalopram (LEXAPRO) 20 MG tablet, Take 10 tablets by mouth daily., Disp: , Rfl:    esomeprazole (NEXIUM) 20 MG capsule, Take 20 mg by mouth daily. , Disp: , Rfl:    ezetimibe (ZETIA) 10 MG tablet, Take 1 tablet (10 mg total) by mouth daily., Disp: 90 tablet, Rfl: 3   ferrous sulfate 325 (65 FE) MG tablet, Take 325 mg by mouth daily., Disp: , Rfl:    ipratropium (ATROVENT) 0.03 % nasal  spray, Place 2 sprays into both nostrils every 12 (twelve) hours., Disp: 30 mL, Rfl: 12   loratadine (CLARITIN) 10 MG tablet, Take 10 mg by mouth daily., Disp: , Rfl:    Magnesium 250 MG TABS, Take 1 tablet by mouth daily., Disp: , Rfl:    metFORMIN (GLUCOPHAGE-XR) 750 MG 24 hr tablet, TAKE 2 TABLETS (1,500 MG TOTAL) BY MOUTH EVERY DAY WITH BREAKFAST, Disp: 180 tablet, Rfl: 1   Semaglutide, 2 MG/DOSE, 8 MG/3ML SOPN, Inject 2 mg as directed once a week., Disp: 9 mL, Rfl: 0   zolpidem (AMBIEN) 10 MG tablet, Take 10 mg by mouth at bedtime., Disp: , Rfl:   Allergies  Allergen Reactions   Compazine [Prochlorperazine] Anaphylaxis   Prednisone Anaphylaxis   Prochlorperazine Edisylate Anaphylaxis   Etodolac     Other reaction(s): Unknown   Farxiga [Dapagliflozin]     Constipation and headaches   Hydrocodone-Acetaminophen Nausea Only   Morphine And Related Nausea Only and Other (See Comments)    Mood swings, headache     ROS  ***  Objective  There were no vitals filed for this visit.  There is no height or weight on file to calculate BMI.  Physical Exam ***  Recent Results (from the past 2160 hour(s))  POCT HgB A1C     Status: Abnormal   Collection Time: 05/20/22  1:48 PM  Result Value Ref Range   Hemoglobin A1C 6.1 (A) 4.0 - 5.6 %   HbA1c POC (<> result, manual entry)     HbA1c, POC (prediabetic range)     HbA1c, POC (controlled diabetic range)       Fall Risk:    05/20/2022    1:47 PM 01/20/2022    9:21 AM 10/22/2021    1:03 PM 07/09/2021    8:07 AM 06/23/2021    1:06 PM  Fall Risk   Falls in the past year? 0 1 1 0 0  Number falls in past yr:  0 0 0 0  Injury with Fall?  0 0 0 0  Risk for fall due to : No Fall Risks Impaired balance/gait Impaired balance/gait No Fall Risks No Fall Risks  Follow up Falls prevention discussed Falls prevention discussed;Education provided Falls prevention discussed;Education provided Falls prevention discussed Falls prevention discussed      Functional Status Survey:     Assessment & Plan  1. Well adult exam ***   -USPSTF grade A and B recommendations reviewed with patient; age-appropriate recommendations, preventive care, screening tests, etc discussed and encouraged; healthy living encouraged; see AVS for patient education given to patient -Discussed importance of 150 minutes of physical activity weekly, eat two servings of fish weekly, eat one serving of tree nuts ( cashews, pistachios, pecans, almonds.Marland Kitchen) every other day, eat 6 servings of fruit/vegetables daily and drink plenty of water and avoid sweet beverages.   -Reviewed Health Maintenance: Yes.

## 2022-07-22 ENCOUNTER — Encounter: Payer: 59 | Admitting: Family Medicine

## 2022-07-22 DIAGNOSIS — Z124 Encounter for screening for malignant neoplasm of cervix: Secondary | ICD-10-CM

## 2022-07-22 DIAGNOSIS — Z0000 Encounter for general adult medical examination without abnormal findings: Secondary | ICD-10-CM

## 2022-08-17 ENCOUNTER — Telehealth: Payer: Self-pay | Admitting: Family Medicine

## 2022-08-17 NOTE — Telephone Encounter (Signed)
Called patient to schedule Medicare Annual Wellness Visit (AWV). Left message for patient to call back and schedule Medicare Annual Wellness Visit (AWV).  Last date of AWV: 01/14/2021  Please schedule an appointment at any time with [facility].  If any questions, please contact me at [phone].  Thank you ,  Randon Goldsmith Care Guide [facility] AWV TEAM Direct Dial: [phone]

## 2022-09-30 ENCOUNTER — Encounter: Payer: 59 | Admitting: Family Medicine

## 2022-10-12 ENCOUNTER — Encounter: Payer: 59 | Admitting: Family Medicine

## 2022-10-16 ENCOUNTER — Emergency Department
Admission: EM | Admit: 2022-10-16 | Discharge: 2022-10-17 | Disposition: A | Discharge status: Home / Self Care | Payer: Medicaid Other | Attending: Emergency Medicine | Admitting: Emergency Medicine

## 2022-10-16 ENCOUNTER — Emergency Department: Payer: Medicaid Other

## 2022-10-16 ENCOUNTER — Other Ambulatory Visit: Payer: Self-pay

## 2022-10-16 DIAGNOSIS — R0789 Other chest pain: Secondary | ICD-10-CM | POA: Insufficient documentation

## 2022-10-16 DIAGNOSIS — E119 Type 2 diabetes mellitus without complications: Secondary | ICD-10-CM | POA: Insufficient documentation

## 2022-10-16 DIAGNOSIS — R6889 Other general symptoms and signs: Secondary | ICD-10-CM | POA: Diagnosis not present

## 2022-10-16 DIAGNOSIS — R1013 Epigastric pain: Secondary | ICD-10-CM | POA: Diagnosis not present

## 2022-10-16 DIAGNOSIS — R079 Chest pain, unspecified: Secondary | ICD-10-CM | POA: Diagnosis not present

## 2022-10-16 DIAGNOSIS — R0602 Shortness of breath: Secondary | ICD-10-CM | POA: Diagnosis not present

## 2022-10-16 DIAGNOSIS — Z743 Need for continuous supervision: Secondary | ICD-10-CM | POA: Diagnosis not present

## 2022-10-16 LAB — CBC
HCT: 36.2 % (ref 36.0–46.0)
Hemoglobin: 11.9 g/dL — ABNORMAL LOW (ref 12.0–15.0)
MCH: 28.5 pg (ref 26.0–34.0)
MCHC: 32.9 g/dL (ref 30.0–36.0)
MCV: 86.6 fL (ref 80.0–100.0)
Platelets: 322 10*3/uL (ref 150–400)
RBC: 4.18 MIL/uL (ref 3.87–5.11)
RDW: 12.3 % (ref 11.5–15.5)
WBC: 6.6 10*3/uL (ref 4.0–10.5)
nRBC: 0 % (ref 0.0–0.2)

## 2022-10-16 LAB — BASIC METABOLIC PANEL WITH GFR
Anion gap: 11 (ref 5–15)
BUN: 8 mg/dL (ref 6–20)
CO2: 21 mmol/L — ABNORMAL LOW (ref 22–32)
Calcium: 9 mg/dL (ref 8.9–10.3)
Chloride: 104 mmol/L (ref 98–111)
Creatinine, Ser: 0.63 mg/dL (ref 0.44–1.00)
GFR, Estimated: >60 mL/min
Glucose, Bld: 121 mg/dL — ABNORMAL HIGH (ref 70–99)
Potassium: 3.4 mmol/L — ABNORMAL LOW (ref 3.5–5.1)
Sodium: 136 mmol/L (ref 135–145)

## 2022-10-16 LAB — TROPONIN I (HIGH SENSITIVITY): Troponin I (High Sensitivity): <2 ng/L

## 2022-10-16 MED ORDER — FAMOTIDINE IN NACL 20-0.9 MG/50ML-% IV SOLN
20.0000 mg | Freq: Once | INTRAVENOUS | Status: AC
  Administered 2022-10-17: 20 mg via INTRAVENOUS
  Filled 2022-10-16: qty 50

## 2022-10-16 MED ORDER — ACETAMINOPHEN 500 MG PO TABS
1000.0000 mg | ORAL_TABLET | Freq: Once | ORAL | Status: AC
  Administered 2022-10-17: 1000 mg via ORAL
  Filled 2022-10-16: qty 2

## 2022-10-16 NOTE — ED Provider Notes (Signed)
[facility] Provider Note    Event Date/Time   First MD Initiated Contact with Patient 10/16/22 2307     (approximate)   History   Chest Pain (Since 130pm)   HPI  Donna Haas is a 47 y.o. female past medical history significant for diabetes, anxiety, obesity, lupus (no current immunosuppressive medication), presents to the emergency department with chest pain.  States that she was sitting watching TV when she developed a sharp pain to the left chest wall that radiated down her arm and up to her ear.  Associated with some mild shortness of breath.  Denies any extremity numbness or tingling.  Denies any swelling to her legs.  Mild epigastric pain.  States this feels different from prior episodes of acid reflux.  Prior cholecystectomy and appendectomy.  No prior history of PE or DVT.  No recent travel.  Not on any birth control.  Prior tubal ligation.  Currently on Ozempic and metformin.  When EMS arrived she took her Xanax as instructed by EMS and had ongoing pain so she came to the emergency department.  No prior stress testing or cardiac catheterization.  No concern for pregnancy.     Physical Exam   Triage Vital Signs: ED Triage Vitals [10/16/22 2141]  Enc Vitals Group     BP (!) 149/98     Pulse Rate 83     Resp 16     Temp 98 F (36.7 C)     Temp Source Oral     SpO2 98 %     Weight 175 lb (79.4 kg)     Height 4\' 11"  (1.499 m)     Head Circumference      Peak Flow      Pain Score 6     Pain Loc      Pain Edu?      Excl. in GC?     Most recent vital signs: Vitals:   10/17/22 0015 10/17/22 0100  BP:  (!) 142/95  Pulse: 73 72  Resp: 18 19  Temp:    SpO2: 98% 99%    Physical Exam Constitutional:      Appearance: She is well-developed.  HENT:     Head: Atraumatic.  Eyes:     Conjunctiva/sclera: Conjunctivae normal.  Cardiovascular:     Rate and Rhythm: Regular rhythm.     Heart sounds: Normal heart sounds. No murmur  heard. Pulmonary:     Effort: No respiratory distress.     Breath sounds: Normal breath sounds. No wheezing.  Abdominal:     General: There is no distension.     Palpations: Abdomen is soft.     Tenderness: There is abdominal tenderness (Epigastric tenderness to palpation with no rebound or guarding.).  Musculoskeletal:        General: Normal range of motion.     Cervical back: Normal range of motion.     Right lower leg: No edema.     Left lower leg: No edema.  Skin:    General: Skin is warm.  Neurological:     Mental Status: She is alert. Mental status is at baseline.     IMPRESSION / MDM / ASSESSMENT AND PLAN / ED COURSE  I reviewed the triage vital signs and the nursing notes.  Differential diagnosis including pulmonary embolism, GERD/PUD, pericarditis, costochondritis, pneumonia, anxiety, ACS  EKG  I, Corena Herter, the attending physician, personally viewed and interpreted this ECG.   Rate: Normal  Rhythm: Normal sinus  Axis: Normal  Intervals: Normal  ST&T Change: None  No tachycardic or bradycardic dysrhythmias while on cardiac telemetry.  RADIOLOGY I independently reviewed imaging, my interpretation of imaging: Chest x-ray no signs of pneumonia.  No acute findings.  Read as no acute findings.  LABS (all labs ordered are listed, but only abnormal results are displayed) Labs interpreted as -    Labs Reviewed  BASIC METABOLIC PANEL - Abnormal; Notable for the following components:      Result Value   Potassium 3.4 (*)    CO2 21 (*)    Glucose, Bld 121 (*)    All other components within normal limits  CBC - Abnormal; Notable for the following components:   Hemoglobin 11.9 (*)    All other components within normal limits  D-DIMER, QUANTITATIVE  HEPATIC FUNCTION PANEL  TROPONIN I (HIGH SENSITIVITY)  TROPONIN I (HIGH SENSITIVITY)     MDM    Low suspicion for ACS, low risk heart score of 2, serial troponins are undetectable.  Clinical picture is not  consistent with pulmonary embolism, low risk Wells criteria, D-dimer is negative, do not feel that further workup is necessary at this time.  Possible gastritis or costochondritis.  No findings on EKG, no rub or worsening with change of position, does not meet criteria for acute pericarditis.  Possibly musculoskeletal, does lift her 78-year-old.  No uncontrolled hypertension, no tearing chest pain, pulses are equal, have low suspicion for dissection.  Discussed symptomatic treatment with Motrin and Tylenol.  Discussed close follow-up with primary care physician.  Given return precautions for any worsening symptoms.   PROCEDURES:  Critical Care performed: No  Procedures  Patient's presentation is most consistent with acute presentation with potential threat to life or bodily function.   MEDICATIONS ORDERED IN ED: Medications  famotidine (PEPCID) IVPB 20 mg premix (0 mg Intravenous Stopped 10/17/22 0057)  acetaminophen (TYLENOL) tablet 1,000 mg (1,000 mg Oral Given 10/17/22 0024)    FINAL CLINICAL IMPRESSION(S) / ED DIAGNOSES   Final diagnoses:  Chest pain, unspecified type     Rx / DC Orders   ED Discharge Orders     None        Note:  This document was prepared using Dragon voice recognition software and may include unintentional dictation errors.   Corena Herter, MD 10/17/22 [phone]

## 2022-10-16 NOTE — ED Triage Notes (Signed)
Pt to ed from home via [facility] for CP. Pt states "I have anxiety, I did take my PRN meds at home with no relief". Pt is caox4, and in no acute distress in triage.

## 2022-10-16 NOTE — ED Triage Notes (Addendum)
FIRST NURSE NOTE:  Pt arrived via [facility] from home with c/o chest pain, called EMS earlier for the same but declined transport.  P thas hx of Lupus Per EMS pt c/o L side chest pain radiating up L side of neck and shoulder.   133/86 98% RA P-80 NSR with PVCs  Pt took ibuprofen, xanax and metformin  Pt has hx of bilateral neuropathy in hands, IV attempted by EMS unsuccessful.

## 2022-10-17 LAB — HEPATIC FUNCTION PANEL
ALT: 15 U/L (ref 0–44)
AST: 15 U/L (ref 15–41)
Albumin: 4 g/dL (ref 3.5–5.0)
Alkaline Phosphatase: 68 U/L (ref 38–126)
Bilirubin, Direct: <0.1 mg/dL (ref 0.0–0.2)
Total Bilirubin: 0.4 mg/dL (ref 0.3–1.2)
Total Protein: 6.9 g/dL (ref 6.5–8.1)

## 2022-10-17 LAB — TROPONIN I (HIGH SENSITIVITY): Troponin I (High Sensitivity): <2 ng/L

## 2022-10-17 LAB — D-DIMER, QUANTITATIVE: D-Dimer, Quant: 0.41 ug{FEU}/mL (ref 0.00–0.50)

## 2022-10-17 NOTE — Discharge Instructions (Addendum)
You were seen in the emergency department for chest pain.  You are not having a heart attack today, you had 2 heart enzymes that were normal.  You had a screening test and no concern for a blood clot today.  It is important that you follow-up closely with your primary care physician.  Return to the emergency department for any ongoing or worsening symptoms.  Pain control:  Ibuprofen (motrin/aleve/advil) - You can take 3 tablets (600 mg) every 6 hours as needed for pain/fever.  Acetaminophen (tylenol) - You can take 2 extra strength tablets (1000 mg) every 6 hours as needed for pain/fever.  You can alternate these medications or take them together.  Make sure you eat food/drink water when taking these medications.

## 2022-10-17 NOTE — ED Notes (Signed)
Patient reports no changes in chest discomfort at this time.

## 2022-10-20 ENCOUNTER — Encounter: Payer: 59 | Admitting: Family Medicine

## 2022-10-26 ENCOUNTER — Other Ambulatory Visit: Payer: Self-pay

## 2022-10-26 ENCOUNTER — Telehealth: Payer: Self-pay | Admitting: Family Medicine

## 2022-10-26 ENCOUNTER — Telehealth: Payer: Self-pay

## 2022-10-26 DIAGNOSIS — M329 Systemic lupus erythematosus, unspecified: Secondary | ICD-10-CM

## 2022-10-26 NOTE — Telephone Encounter (Signed)
Transition Care Management Unsuccessful Follow-up Telephone Call  Date of discharge and from where:  [facility] 7/6  Attempts:  2nd Attempt  Reason for unsuccessful TCM follow-up call:  No answer/busy   Lenard Forth [facility] Guide, [facility] [phone] [address], [place], [place] [address] Phone: [phone] Email: [email or web address]

## 2022-10-26 NOTE — Telephone Encounter (Unsigned)
Copied from CRM [id number]. Topic: Referral - Question >> Oct 26, 2022 11:48 AM Donna Haas wrote: Patient states that she is not able to got to [facility] because she had to cancel an appointment 3 times and would like to know if she can be referred somewhere else. Please advise.

## 2022-10-26 NOTE — Telephone Encounter (Signed)
 Transition Care Management Unsuccessful Follow-up Telephone Call  Date of discharge and from where:  [facility] 7/6  Attempts:  1st Attempt  Reason for unsuccessful TCM follow-up call:  No answer/busy   Donna Haas [facility] Guide, [facility] [phone] [address] [place], [place], [place] [address] Phone: [phone] Email: [email or web address]

## 2022-10-27 ENCOUNTER — Telehealth: Payer: Self-pay

## 2022-10-27 DIAGNOSIS — R0981 Nasal congestion: Secondary | ICD-10-CM | POA: Diagnosis not present

## 2022-10-27 DIAGNOSIS — U071 COVID-19: Secondary | ICD-10-CM | POA: Diagnosis not present

## 2022-10-27 NOTE — Telephone Encounter (Signed)
Copied from CRM [id number]. Topic: General - Other >> Oct 27, 2022  1:45 PM Santiya F wrote: Reason for CRM: Alaina with [facility] is calling in because they received a referral for the pt and wanted to inform PCP they would not be able to see this pt because provider says there is nothing they can do for the pt.

## 2022-10-28 ENCOUNTER — Encounter: Payer: 59 | Admitting: Family Medicine

## 2022-11-04 ENCOUNTER — Other Ambulatory Visit: Payer: Self-pay | Admitting: Family Medicine

## 2022-11-04 ENCOUNTER — Encounter: Payer: 59 | Admitting: Family Medicine

## 2022-11-04 ENCOUNTER — Encounter: Payer: Self-pay | Admitting: Family Medicine

## 2022-11-04 DIAGNOSIS — E1169 Type 2 diabetes mellitus with other specified complication: Secondary | ICD-10-CM

## 2022-11-04 DIAGNOSIS — Z124 Encounter for screening for malignant neoplasm of cervix: Secondary | ICD-10-CM

## 2022-11-04 DIAGNOSIS — Z794 Long term (current) use of insulin: Secondary | ICD-10-CM

## 2022-11-04 DIAGNOSIS — Z0000 Encounter for general adult medical examination without abnormal findings: Secondary | ICD-10-CM

## 2022-11-04 MED ORDER — SEMAGLUTIDE (2 MG/DOSE) 8 MG/3ML ~~LOC~~ SOPN: 2.0000 mg | PEN_INJECTOR | SUBCUTANEOUS | 0 refills | Status: DC

## 2022-11-04 MED ORDER — METFORMIN HCL ER 750 MG PO TB24: ORAL_TABLET | ORAL | 0 refills | Status: DC

## 2022-11-04 NOTE — Telephone Encounter (Signed)
Medication Refill - Medication: metFORMIN (GLUCOPHAGE-XR) 750 MG 24 hr tablet   Has the patient contacted their pharmacy? Yes.   ()  Preferred Pharmacy (with phone number or street name):  [facility] [address], [place] - [address] [place], [place] [address] Phone: [phone]  Fax: [phone]   Has the patient been seen for an appointment in the last year OR does the patient have an upcoming appointment? Yes.    Agent: Please be advised that RX refills may take up to 3 business days. We ask that you follow-up with your pharmacy.

## 2022-11-10 ENCOUNTER — Encounter: Payer: 59 | Admitting: Family Medicine

## 2022-11-10 DIAGNOSIS — Z0000 Encounter for general adult medical examination without abnormal findings: Secondary | ICD-10-CM

## 2022-11-10 DIAGNOSIS — Z124 Encounter for screening for malignant neoplasm of cervix: Secondary | ICD-10-CM

## 2022-11-18 ENCOUNTER — Ambulatory Visit: Payer: 59 | Admitting: Family Medicine

## 2022-11-20 NOTE — Progress Notes (Deleted)
Name: Donna Haas   MRN: [id number]    DOB: 1975/11/20   Date:11/20/2022       Progress Note  Subjective  Chief Complaint  Follow Up  HPI  DMII: She is now off Insulin, taking Ozempic 1 mg and Metformin two daily, no hypoglycemic episodes but could not tolerate Mounjarno that triggered migraine and unable to eat anything  FSBS has been between 90-110 fasting. She denies polyphagia, polydipsia or polyuria. She has tried statin therapy in the past but could not tolerate it , causes weakness/nausea and myopathy, we will try to send Zetia   Morbid obesity: she states ozempic is helping curb her appetite she thinks, weigh is now down to 180 lbs and BMI barely above 35. Continue to do life style modification. Eating smaller portions and has been more active lately   Lupus Systemic : diagnosed years ago, no rashes at this time, feels achy and has joint aches. She used to go to [facility] but not in years, Dr. Sherie Don treated her with Plaquenil but it caused vision disturbances. She had an appointment with Dr. Allena Katz in January 2023 but she was sick and could not keep appointment, she was sick again for second appointment. Advised her to contact them and  re-schedule  History of Barrett's: last EGD was in 2009 per our records, she thinks she had another one done in 2017. She is taking Nexium 20 mg otc and symptoms of reflux are controlled   Thyroid nodule: used to see Dr. Willeen Cass and also lost to follow up, TSH within normal limits .  IBS: diarrhea type, controlled with diet.   Menorrhagia/endometriosis: she is due for pap smear, she is taking iron pills. Discussed last labs and we will recheck on her next visit   GAD/OCD/Dysthymia: under the care of Dr. Evelene Croon and stable  Patient Active Problem List   Diagnosis Date Noted   Irritable bowel syndrome with diarrhea 05/20/2022   Statin myopathy 05/20/2022   Seasonal allergic rhinitis 01/20/2022   Migraine without aura and responsive to treatment  11/20/2020   Cervical nerve root disorder 11/20/2020   B12 deficiency 11/20/2020   Dyslipidemia associated with type 2 diabetes mellitus (HCC) 06/14/2020   Anxiety, generalized 12/13/2019   Binge eating disorder 08/23/2019   Mild intermittent asthma without complication 08/23/2019   Morbid obesity (HCC) 01/25/2016   Barrett's esophagus determined by biopsy 01/21/2016   Carpal tunnel syndrome, bilateral 01/21/2016   Hx gestational diabetes 01/21/2016   Neck pain 09/08/2012   INSOMNIA 09/21/2008   NEPHROLITHIASIS, HX OF 04/17/2008   THYROID NODULE 01/12/2008   GERD 05/30/2007   ENDOMETRIOSIS OF OTHER SPECIFIED SITES 05/30/2007   Systemic lupus erythematosus (HCC) 05/30/2007   HERNIATED LUMBAR DISC 05/30/2007   BACK PAIN 05/30/2007    Past Surgical History:  Procedure Laterality Date   APPENDECTOMY     CESAREAN SECTION     x2   CESAREAN SECTION WITH BILATERAL TUBAL LIGATION Bilateral 08/30/2015   Procedure: CESAREAN SECTION WITH BILATERAL TUBAL LIGATION;  Surgeon: Elenora Fender Ward, MD;  Location: [facility] ORS;  Service: Obstetrics;  Laterality: Bilateral;   CHOLECYSTECTOMY     LAPAROSCOPY     x3   THYROID CYST EXCISION     Dr. Rolly Salter, [place]    Family History  Problem Relation Age of Onset   Depression Mother        Bi-polar   Asthma Mother    Allergies Mother    Sleep apnea Mother    Allergies  Sister    Diabetes Sister    Rheum arthritis Maternal Grandmother    Hypertension Paternal Grandmother    Macular degeneration Paternal Grandmother    Hyperlipidemia Other    Hypertension Other    Heart disease Other     Social History   Tobacco Use   Smoking status: Never   Smokeless tobacco: Never  Substance Use Topics   Alcohol use: No    Alcohol/week: 1.0 - 2.0 standard drink of alcohol    Types: 1 - 2 Standard drinks or equivalent per week    Comment: discontinued use during pregnancy     Current Outpatient Medications:    ACCU-CHEK GUIDE test strip, USE UP TO 4  TIMES DAILY AS DIRECTED, Disp: , Rfl:    Accu-Chek Softclix Lancets lancets, SMARTSIG:Topical 1-4 Times Daily, Disp: , Rfl:    ALPRAZolam (XANAX) 1 MG tablet, Take 1 mg by mouth daily as needed., Disp: , Rfl:    blood glucose meter kit and supplies, Dispense based on patient and insurance preference. Use up to four times daily as directed. (FOR ICD-10 E10.9, E11.9)., Disp: 1 each, Rfl: 0   Cyanocobalamin (B-12) 1000 MCG SUBL, Place under the tongue., Disp: , Rfl:    escitalopram (LEXAPRO) 20 MG tablet, Take 10 tablets by mouth daily., Disp: , Rfl:    esomeprazole (NEXIUM) 20 MG capsule, Take 20 mg by mouth daily. , Disp: , Rfl:    ezetimibe (ZETIA) 10 MG tablet, Take 1 tablet (10 mg total) by mouth daily., Disp: 90 tablet, Rfl: 3   ferrous sulfate 325 (65 FE) MG tablet, Take 325 mg by mouth daily., Disp: , Rfl:    ipratropium (ATROVENT) 0.03 % nasal spray, Place 2 sprays into both nostrils every 12 (twelve) hours., Disp: 30 mL, Rfl: 12   loratadine (CLARITIN) 10 MG tablet, Take 10 mg by mouth daily., Disp: , Rfl:    Magnesium 250 MG TABS, Take 1 tablet by mouth daily., Disp: , Rfl:    metFORMIN (GLUCOPHAGE-XR) 750 MG 24 hr tablet, TAKE 2 TABLETS (1,500 MG TOTAL) BY MOUTH EVERY DAY WITH BREAKFAST, Disp: 60 tablet, Rfl: 0   Semaglutide, 2 MG/DOSE, 8 MG/3ML SOPN, Inject 2 mg as directed once a week., Disp: 2 mL, Rfl: 0   zolpidem (AMBIEN) 10 MG tablet, Take 10 mg by mouth at bedtime., Disp: , Rfl:   Allergies  Allergen Reactions   Compazine [Prochlorperazine] Anaphylaxis   Prednisone Anaphylaxis   Prochlorperazine Edisylate Anaphylaxis   Etodolac     Other reaction(s): Unknown   Farxiga [Dapagliflozin]     Constipation and headaches   Hydrocodone-Acetaminophen Nausea Only   Morphine And Codeine Nausea Only and Other (See Comments)    Mood swings, headache    I personally reviewed active problem list, medication list, allergies, family history, social history, health maintenance with the  patient/caregiver today.   ROS  ***  Objective  There were no vitals filed for this visit.  There is no height or weight on file to calculate BMI.  Physical Exam ***   PHQ2/9:    05/20/2022    1:47 PM 01/20/2022    9:21 AM 10/22/2021    1:03 PM 07/09/2021    8:07 AM 06/23/2021    1:06 PM  Depression screen PHQ 2/9  Decreased Interest 0 0 0 0 0  Down, Depressed, Hopeless 0 0 0 0 0  PHQ - 2 Score 0 0 0 0 0  Altered sleeping 0 0 0 0 0  Tired, decreased energy 0 0 0 0 0  Change in appetite 0 0 0 0 0  Feeling bad or failure about yourself  0 0 0 0 0  Trouble concentrating 0 0 0 0 0  Moving slowly or fidgety/restless 0 0 0 0 0  Suicidal thoughts 0 0 0 0 0  PHQ-9 Score 0 0 0 0 0  Difficult doing work/chores  Not difficult at all Not difficult at all Not difficult at all     phq 9 is {gen pos [id number]}   Fall Risk:    05/20/2022    1:47 PM 01/20/2022    9:21 AM 10/22/2021    1:03 PM 07/09/2021    8:07 AM 06/23/2021    1:06 PM  Fall Risk   Falls in the past year? 0 1 1 0 0  Number falls in past yr:  0 0 0 0  Injury with Fall?  0 0 0 0  Risk for fall due to : No Fall Risks Impaired balance/gait Impaired balance/gait No Fall Risks No Fall Risks  Follow up Falls prevention discussed Falls prevention discussed;Education provided Falls prevention discussed;Education provided Falls prevention discussed Falls prevention discussed      Functional Status Survey:      Assessment & Plan  *** There are no diagnoses linked to this encounter.

## 2022-11-23 ENCOUNTER — Ambulatory Visit: Payer: 59 | Admitting: Family Medicine

## 2022-11-23 DIAGNOSIS — E1165 Type 2 diabetes mellitus with hyperglycemia: Secondary | ICD-10-CM

## 2022-11-26 ENCOUNTER — Other Ambulatory Visit: Payer: Self-pay | Admitting: Family Medicine

## 2022-11-26 DIAGNOSIS — Z794 Long term (current) use of insulin: Secondary | ICD-10-CM

## 2022-11-26 DIAGNOSIS — E1165 Type 2 diabetes mellitus with hyperglycemia: Secondary | ICD-10-CM

## 2022-11-26 NOTE — Progress Notes (Deleted)
Name: Donna Haas   MRN: [id number]    DOB: January 07, 1976   Date:11/26/2022       Progress Note  Subjective  Chief Complaint  Follow Up  HPI  DMII: She is now off Insulin, taking Ozempic 1 mg and Metformin two daily, no hypoglycemic episodes but could not tolerate Mounjarno that triggered migraine and unable to eat anything  FSBS has been between 90-110 fasting. She denies polyphagia, polydipsia or polyuria. She has tried statin therapy in the past but could not tolerate it , causes weakness/nausea and myopathy, we will try to send Zetia   Morbid obesity: she states ozempic is helping curb her appetite she thinks, weigh is now down to 180 lbs and BMI barely above 35. Continue to do life style modification. Eating smaller portions and has been more active lately   Lupus Systemic : diagnosed years ago, no rashes at this time, feels achy and has joint aches. She used to go to [facility] but not in years, Dr. Sherie Don treated her with Plaquenil but it caused vision disturbances. She had an appointment with Dr. Allena Katz in January 2023 but she was sick and could not keep appointment, she was sick again for second appointment. Advised her to contact them and  re-schedule  History of Barrett's: last EGD was in 2009 per our records, she thinks she had another one done in 2017. She is taking Nexium 20 mg otc and symptoms of reflux are controlled   Thyroid nodule: used to see Dr. Willeen Cass and also lost to follow up, TSH within normal limits .  IBS: diarrhea type, controlled with diet.   Menorrhagia/endometriosis: she is due for pap smear, she is taking iron pills. Discussed last labs and we will recheck on her next visit   GAD/OCD/Dysthymia: under the care of Dr. Evelene Croon and stable  Patient Active Problem List   Diagnosis Date Noted   Irritable bowel syndrome with diarrhea 05/20/2022   Statin myopathy 05/20/2022   Seasonal allergic rhinitis 01/20/2022   Migraine without aura and responsive to treatment  11/20/2020   Cervical nerve root disorder 11/20/2020   B12 deficiency 11/20/2020   Dyslipidemia associated with type 2 diabetes mellitus (HCC) 06/14/2020   Anxiety, generalized 12/13/2019   Binge eating disorder 08/23/2019   Mild intermittent asthma without complication 08/23/2019   Morbid obesity (HCC) 01/25/2016   Barrett's esophagus determined by biopsy 01/21/2016   Carpal tunnel syndrome, bilateral 01/21/2016   Hx gestational diabetes 01/21/2016   Neck pain 09/08/2012   INSOMNIA 09/21/2008   NEPHROLITHIASIS, HX OF 04/17/2008   THYROID NODULE 01/12/2008   GERD 05/30/2007   ENDOMETRIOSIS OF OTHER SPECIFIED SITES 05/30/2007   Systemic lupus erythematosus (HCC) 05/30/2007   HERNIATED LUMBAR DISC 05/30/2007   BACK PAIN 05/30/2007    Past Surgical History:  Procedure Laterality Date   APPENDECTOMY     CESAREAN SECTION     x2   CESAREAN SECTION WITH BILATERAL TUBAL LIGATION Bilateral 08/30/2015   Procedure: CESAREAN SECTION WITH BILATERAL TUBAL LIGATION;  Surgeon: Elenora Fender Ward, MD;  Location: [facility];  Service: Obstetrics;  Laterality: Bilateral;   CHOLECYSTECTOMY     LAPAROSCOPY     x3   THYROID CYST EXCISION     Dr. Rolly Salter, [place]    Family History  Problem Relation Age of Onset   Depression Mother        Bi-polar   Asthma Mother    Allergies Mother    Sleep apnea Mother    Allergies  Sister    Diabetes Sister    Rheum arthritis Maternal Grandmother    Hypertension Paternal Grandmother    Macular degeneration Paternal Grandmother    Hyperlipidemia Other    Hypertension Other    Heart disease Other     Social History   Tobacco Use   Smoking status: Never   Smokeless tobacco: Never  Substance Use Topics   Alcohol use: No    Alcohol/week: 1.0 - 2.0 standard drink of alcohol    Types: 1 - 2 Standard drinks or equivalent per week    Comment: discontinued use during pregnancy     Current Outpatient Medications:    ACCU-CHEK GUIDE test strip, USE UP TO 4  TIMES DAILY AS DIRECTED, Disp: , Rfl:    Accu-Chek Softclix Lancets lancets, SMARTSIG:Topical 1-4 Times Daily, Disp: , Rfl:    ALPRAZolam (XANAX) 1 MG tablet, Take 1 mg by mouth daily as needed., Disp: , Rfl:    blood glucose meter kit and supplies, Dispense based on patient and insurance preference. Use up to four times daily as directed. (FOR ICD-10 E10.9, E11.9)., Disp: 1 each, Rfl: 0   Cyanocobalamin (B-12) 1000 MCG SUBL, Place under the tongue., Disp: , Rfl:    escitalopram (LEXAPRO) 20 MG tablet, Take 10 tablets by mouth daily., Disp: , Rfl:    esomeprazole (NEXIUM) 20 MG capsule, Take 20 mg by mouth daily. , Disp: , Rfl:    ezetimibe (ZETIA) 10 MG tablet, Take 1 tablet (10 mg total) by mouth daily., Disp: 90 tablet, Rfl: 3   ferrous sulfate 325 (65 FE) MG tablet, Take 325 mg by mouth daily., Disp: , Rfl:    ipratropium (ATROVENT) 0.03 % nasal spray, Place 2 sprays into both nostrils every 12 (twelve) hours., Disp: 30 mL, Rfl: 12   loratadine (CLARITIN) 10 MG tablet, Take 10 mg by mouth daily., Disp: , Rfl:    Magnesium 250 MG TABS, Take 1 tablet by mouth daily., Disp: , Rfl:    metFORMIN (GLUCOPHAGE-XR) 750 MG 24 hr tablet, TAKE 2 TABLETS (1,500 MG TOTAL) BY MOUTH EVERY DAY WITH BREAKFAST, Disp: 60 tablet, Rfl: 0   Semaglutide, 2 MG/DOSE, 8 MG/3ML SOPN, Inject 2 mg as directed once a week., Disp: 2 mL, Rfl: 0   zolpidem (AMBIEN) 10 MG tablet, Take 10 mg by mouth at bedtime., Disp: , Rfl:   Allergies  Allergen Reactions   Compazine [Prochlorperazine] Anaphylaxis   Prednisone Anaphylaxis   Prochlorperazine Edisylate Anaphylaxis   Etodolac     Other reaction(s): Unknown   Farxiga [Dapagliflozin]     Constipation and headaches   Hydrocodone-Acetaminophen Nausea Only   Morphine And Codeine Nausea Only and Other (See Comments)    Mood swings, headache    I personally reviewed active problem list, medication list, allergies, family history, social history, health maintenance with the  patient/caregiver today.   ROS  ***  Objective  There were no vitals filed for this visit.  There is no height or weight on file to calculate BMI.  Physical Exam ***   PHQ2/9:    05/20/2022    1:47 PM 01/20/2022    9:21 AM 10/22/2021    1:03 PM 07/09/2021    8:07 AM 06/23/2021    1:06 PM  Depression screen PHQ 2/9  Decreased Interest 0 0 0 0 0  Down, Depressed, Hopeless 0 0 0 0 0  PHQ - 2 Score 0 0 0 0 0  Altered sleeping 0 0 0 0 0  Tired, decreased energy 0 0 0 0 0  Change in appetite 0 0 0 0 0  Feeling bad or failure about yourself  0 0 0 0 0  Trouble concentrating 0 0 0 0 0  Moving slowly or fidgety/restless 0 0 0 0 0  Suicidal thoughts 0 0 0 0 0  PHQ-9 Score 0 0 0 0 0  Difficult doing work/chores  Not difficult at all Not difficult at all Not difficult at all     phq 9 is {gen pos [id number]}   Fall Risk:    05/20/2022    1:47 PM 01/20/2022    9:21 AM 10/22/2021    1:03 PM 07/09/2021    8:07 AM 06/23/2021    1:06 PM  Fall Risk   Falls in the past year? 0 1 1 0 0  Number falls in past yr:  0 0 0 0  Injury with Fall?  0 0 0 0  Risk for fall due to : No Fall Risks Impaired balance/gait Impaired balance/gait No Fall Risks No Fall Risks  Follow up Falls prevention discussed Falls prevention discussed;Education provided Falls prevention discussed;Education provided Falls prevention discussed Falls prevention discussed      Functional Status Survey:      Assessment & Plan  *** There are no diagnoses linked to this encounter.

## 2022-11-27 ENCOUNTER — Ambulatory Visit: Payer: 59 | Admitting: Family Medicine

## 2022-11-27 DIAGNOSIS — E1165 Type 2 diabetes mellitus with hyperglycemia: Secondary | ICD-10-CM

## 2022-11-30 ENCOUNTER — Encounter: Payer: 59 | Admitting: Family Medicine

## 2022-12-10 ENCOUNTER — Other Ambulatory Visit: Payer: Self-pay | Admitting: Family Medicine

## 2022-12-10 DIAGNOSIS — J4520 Mild intermittent asthma, uncomplicated: Secondary | ICD-10-CM

## 2022-12-10 DIAGNOSIS — J302 Other seasonal allergic rhinitis: Secondary | ICD-10-CM

## 2022-12-11 ENCOUNTER — Ambulatory Visit: Payer: 59 | Admitting: Family Medicine

## 2023-01-02 DIAGNOSIS — M545 Low back pain, unspecified: Secondary | ICD-10-CM | POA: Diagnosis not present

## 2023-01-02 DIAGNOSIS — M533 Sacrococcygeal disorders, not elsewhere classified: Secondary | ICD-10-CM | POA: Diagnosis not present

## 2023-01-02 DIAGNOSIS — M5416 Radiculopathy, lumbar region: Secondary | ICD-10-CM | POA: Diagnosis not present

## 2023-01-10 ENCOUNTER — Other Ambulatory Visit: Payer: Self-pay | Admitting: Family Medicine

## 2023-01-15 ENCOUNTER — Other Ambulatory Visit: Payer: Self-pay | Admitting: Family Medicine

## 2023-01-15 DIAGNOSIS — E1165 Type 2 diabetes mellitus with hyperglycemia: Secondary | ICD-10-CM

## 2023-01-15 DIAGNOSIS — Z794 Long term (current) use of insulin: Secondary | ICD-10-CM

## 2023-02-12 ENCOUNTER — Ambulatory Visit: Payer: 59 | Admitting: Family Medicine

## 2023-02-14 ENCOUNTER — Other Ambulatory Visit: Payer: Self-pay | Admitting: Nurse Practitioner

## 2023-02-14 DIAGNOSIS — E1165 Type 2 diabetes mellitus with hyperglycemia: Secondary | ICD-10-CM

## 2023-02-14 DIAGNOSIS — Z794 Long term (current) use of insulin: Secondary | ICD-10-CM

## 2023-02-15 NOTE — Telephone Encounter (Signed)
Requested medication (s) are due for refill today: yes  Requested medication (s) are on the active medication list: yes  Last refill:  01/15/23  Future visit scheduled: yes  Notes to clinic:  Unable to refill per protocol due to failed labs, no updated  A1c results.      Requested Prescriptions  Pending Prescriptions Disp Refills   metFORMIN (GLUCOPHAGE-XR) 750 MG 24 hr tablet [Pharmacy Med Name: METFORMIN HCL ER 750 MG TABLET] 60 tablet 0    Sig: TAKE 2 TABLETS (1,500 MG TOTAL) BY MOUTH EVERY DAY WITH BREAKFAST     Endocrinology:  Diabetes - Biguanides Failed - 02/14/2023  1:22 AM      Failed - HBA1C is between 0 and 7.9 and within 180 days    Hemoglobin A1C  Date Value Ref Range Status  05/20/2022 6.1 (A) 4.0 - 5.6 % Final   Hgb A1c MFr Bld  Date Value Ref Range Status  01/20/2022 6.1 (H) <5.7 % of total Hgb Final    Comment:    For someone without known diabetes, a hemoglobin  A1c value between 5.7% and 6.4% is consistent with prediabetes and should be confirmed with a  follow-up test. . For someone with known diabetes, a value <7% indicates that their diabetes is well controlled. A1c targets should be individualized based on duration of diabetes, age, comorbid conditions, and other considerations. . This assay result is consistent with an increased risk of diabetes. . Currently, no consensus exists regarding use of hemoglobin A1c for diagnosis of diabetes for children. .          Failed - Valid encounter within last 6 months    Recent Outpatient Visits           9 months ago Dyslipidemia associated with type 2 diabetes mellitus [facility])   [facility] [place], Danna Hefty, MD   1 year ago Type 2 diabetes mellitus with hyperglycemia, with long-term current use of insulin [facility])   [facility] Danelle Berry, PA-C   1 year ago Upper respiratory tract infection due to COVID-19 virus   [facility] Danelle Berry, PA-C   1 year ago Acute cough   [facility] Caro Laroche, DO   1 year ago Dyslipidemia associated with type 2 diabetes mellitus [facility])   [facility] Alba Cory, MD       Future Appointments             In 3 months Alba Cory, MD [facility], [facility]            Failed - CBC within normal limits and completed in the last 12 months    WBC  Date Value Ref Range Status  10/16/2022 6.6 4.0 - 10.5 K/uL Final   RBC  Date Value Ref Range Status  10/16/2022 4.18 3.87 - 5.11 MIL/uL Final   Hemoglobin  Date Value Ref Range Status  10/16/2022 11.9 (L) 12.0 - 15.0 g/dL Final   HCT  Date Value Ref Range Status  10/16/2022 36.2 36.0 - 46.0 % Final   MCHC  Date Value Ref Range Status  10/16/2022 32.9 30.0 - 36.0 g/dL Final   [facility]  Date Value Ref Range Status  10/16/2022 28.5 26.0 - 34.0 pg Final   MCV  Date Value Ref Range Status  10/16/2022 86.6 80.0 - 100.0 fL Final   No results found for: "PLTCOUNTKUC", "LABPLAT", "POCPLA"  RDW  Date Value Ref Range Status  10/16/2022 12.3 11.5 - 15.5 % Final         Passed - Cr in normal range and within 360 days    Creat  Date Value Ref Range Status  01/20/2022 0.69 0.50 - 0.99 mg/dL Final   Creatinine, Ser  Date Value Ref Range Status  10/16/2022 0.63 0.44 - 1.00 mg/dL Final   Creatinine, Urine  Date Value Ref Range Status  01/20/2022 165 20 - 275 mg/dL Final         Passed - eGFR in normal range and within 360 days    GFR, Est African American  Date Value Ref Range Status  01/21/2016 >89 >=60 mL/min Final   GFR calc Af Amer  Date Value Ref Range Status  03/22/2017 >60 >60 mL/min Final    Comment:    (NOTE) The eGFR has been calculated using the CKD EPI equation. This calculation has not been validated in all clinical situations. eGFR's persistently <60 mL/min signify possible Chronic Kidney Disease.     GFR, Est Non African American  Date Value Ref Range Status  01/21/2016 >89 >=60 mL/min Final   GFR, Estimated  Date Value Ref Range Status  10/16/2022 >60 >60 mL/min Final    Comment:    (NOTE) Calculated using the CKD-EPI Creatinine Equation (2021)    GFR  Date Value Ref Range Status  09/02/2011 80.38 >60.00 mL/min Final   eGFR  Date Value Ref Range Status  01/20/2022 110 > OR = 60 mL/min/1.35m2 Final         Passed - B12 Level in normal range and within 720 days    Vitamin B-12  Date Value Ref Range Status  01/20/2022 579 200 - 1,100 pg/mL Final

## 2023-02-16 NOTE — Telephone Encounter (Signed)
Lvm for pt to call back and schedule an appt 

## 2023-03-01 NOTE — Progress Notes (Deleted)
Name: Donna Haas   MRN: [id number]    DOB: June 06, 1975   Date:03/01/2023       Progress Note  Subjective  Chief Complaint  Follow Up  HPI  DMII: She is now off Insulin, taking Ozempic 1 mg and Metformin two daily, no hypoglycemic episodes but could not tolerate Mounjarno that triggered migraine and unable to eat anything  FSBS has been between 90-110 fasting. She denies polyphagia, polydipsia or polyuria. She has tried statin therapy in the past but could not tolerate it , causes weakness/nausea and myopathy, we will try to send Zetia   Morbid obesity: she states ozempic is helping curb her appetite she thinks, weigh is now down to 180 lbs and BMI barely above 35. Continue to do life style modification. Eating smaller portions and has been more active lately   Lupus Systemic : diagnosed years ago, no rashes at this time, feels achy and has joint aches. She used to go to [facility] but not in years, Dr. Sherie Don treated her with Plaquenil but it caused vision disturbances. She had an appointment with Dr. Allena Katz in January 2023 but she was sick and could not keep appointment, she was sick again for second appointment. Advised her to contact them and  re-schedule  History of Barrett's: last EGD was in 2009 per our records, she thinks she had another one done in 2017. She is taking Nexium 20 mg otc and symptoms of reflux are controlled   Thyroid nodule: used to see Dr. Willeen Cass and also lost to follow up, TSH within normal limits .  IBS: diarrhea type, controlled with diet.   Menorrhagia/endometriosis: she is due for pap smear, she is taking iron pills. Discussed last labs and we will recheck on her next visit   GAD/OCD/Dysthymia: under the care of Dr. Evelene Croon and stable  Patient Active Problem List   Diagnosis Date Noted   Irritable bowel syndrome with diarrhea 05/20/2022   Statin myopathy 05/20/2022   Seasonal allergic rhinitis 01/20/2022   Migraine without aura and responsive to treatment  11/20/2020   Cervical nerve root disorder 11/20/2020   B12 deficiency 11/20/2020   Dyslipidemia associated with type 2 diabetes mellitus (HCC) 06/14/2020   Anxiety, generalized 12/13/2019   Binge eating disorder 08/23/2019   Mild intermittent asthma without complication 08/23/2019   Morbid obesity (HCC) 01/25/2016   Barrett's esophagus determined by biopsy 01/21/2016   Carpal tunnel syndrome, bilateral 01/21/2016   Hx gestational diabetes 01/21/2016   Neck pain 09/08/2012   INSOMNIA 09/21/2008   NEPHROLITHIASIS, HX OF 04/17/2008   THYROID NODULE 01/12/2008   GERD 05/30/2007   ENDOMETRIOSIS OF OTHER SPECIFIED SITES 05/30/2007   Systemic lupus erythematosus (HCC) 05/30/2007   HERNIATED LUMBAR DISC 05/30/2007   Backache 05/30/2007    Past Surgical History:  Procedure Laterality Date   APPENDECTOMY     CESAREAN SECTION     x2   CESAREAN SECTION WITH BILATERAL TUBAL LIGATION Bilateral 08/30/2015   Procedure: CESAREAN SECTION WITH BILATERAL TUBAL LIGATION;  Surgeon: Elenora Fender Ward, MD;  Location: [facility];  Service: Obstetrics;  Laterality: Bilateral;   CHOLECYSTECTOMY     LAPAROSCOPY     x3   THYROID CYST EXCISION     Dr. Rolly Salter, [place]    Family History  Problem Relation Age of Onset   Depression Mother        Bi-polar   Asthma Mother    Allergies Mother    Sleep apnea Mother    Allergies Sister  Diabetes Sister    Rheum arthritis Maternal Grandmother    Hypertension Paternal Grandmother    Macular degeneration Paternal Grandmother    Hyperlipidemia Other    Hypertension Other    Heart disease Other     Social History   Tobacco Use   Smoking status: Never   Smokeless tobacco: Never  Substance Use Topics   Alcohol use: No    Alcohol/week: 1.0 - 2.0 standard drink of alcohol    Types: 1 - 2 Standard drinks or equivalent per week    Comment: discontinued use during pregnancy     Current Outpatient Medications:    ACCU-CHEK GUIDE test strip, USE UP TO 4  TIMES DAILY AS DIRECTED, Disp: , Rfl:    Accu-Chek Softclix Lancets lancets, SMARTSIG:Topical 1-4 Times Daily, Disp: , Rfl:    ALPRAZolam (XANAX) 1 MG tablet, Take 1 mg by mouth daily as needed., Disp: , Rfl:    blood glucose meter kit and supplies, Dispense based on patient and insurance preference. Use up to four times daily as directed. (FOR ICD-10 E10.9, E11.9)., Disp: 1 each, Rfl: 0   Cyanocobalamin (B-12) 1000 MCG SUBL, Place under the tongue., Disp: , Rfl:    escitalopram (LEXAPRO) 20 MG tablet, Take 10 tablets by mouth daily., Disp: , Rfl:    esomeprazole (NEXIUM) 20 MG capsule, Take 20 mg by mouth daily. , Disp: , Rfl:    ezetimibe (ZETIA) 10 MG tablet, Take 1 tablet (10 mg total) by mouth daily., Disp: 90 tablet, Rfl: 3   ferrous sulfate 325 (65 FE) MG tablet, Take 325 mg by mouth daily., Disp: , Rfl:    ipratropium (ATROVENT) 0.03 % nasal spray, Place 2 sprays into both nostrils every 12 (twelve) hours., Disp: 30 mL, Rfl: 12   loratadine (CLARITIN) 10 MG tablet, Take 10 mg by mouth daily., Disp: , Rfl:    Magnesium 250 MG TABS, Take 1 tablet by mouth daily., Disp: , Rfl:    metFORMIN (GLUCOPHAGE-XR) 750 MG 24 hr tablet, TAKE 2 TABLETS (1,500 MG TOTAL) BY MOUTH EVERY DAY WITH BREAKFAST, Disp: 60 tablet, Rfl: 0   Semaglutide, 2 MG/DOSE, (OZEMPIC, 2 MG/DOSE,) 8 MG/3ML SOPN, INJECT 2 MG AS DIRECTED ONCE A WEEK., Disp: 8 mL, Rfl: 0   zolpidem (AMBIEN) 10 MG tablet, Take 10 mg by mouth at bedtime., Disp: , Rfl:   Allergies  Allergen Reactions   Compazine [Prochlorperazine] Anaphylaxis   Prednisone Anaphylaxis   Prochlorperazine Edisylate Anaphylaxis   Etodolac     Other reaction(s): Unknown   Farxiga [Dapagliflozin]     Constipation and headaches   Hydrocodone-Acetaminophen Nausea Only   Morphine And Codeine Nausea Only and Other (See Comments)    Mood swings, headache    I personally reviewed active problem list, medication list, allergies, family history, social history, health  maintenance with the patient/caregiver today.   ROS  ***  Objective  There were no vitals filed for this visit.  There is no height or weight on file to calculate BMI.  Physical Exam ***  No results found for this or any previous visit (from the past 2160 hour(s)).  Diabetic Foot Exam: Diabetic Foot Exam - Simple   No data filed    ***  PHQ2/9:    05/20/2022    1:47 PM 01/20/2022    9:21 AM 10/22/2021    1:03 PM 07/09/2021    8:07 AM 06/23/2021    1:06 PM  Depression screen PHQ 2/9  Decreased Interest 0 0  0 0 0  Down, Depressed, Hopeless 0 0 0 0 0  PHQ - 2 Score 0 0 0 0 0  Altered sleeping 0 0 0 0 0  Tired, decreased energy 0 0 0 0 0  Change in appetite 0 0 0 0 0  Feeling bad or failure about yourself  0 0 0 0 0  Trouble concentrating 0 0 0 0 0  Moving slowly or fidgety/restless 0 0 0 0 0  Suicidal thoughts 0 0 0 0 0  PHQ-9 Score 0 0 0 0 0  Difficult doing work/chores  Not difficult at all Not difficult at all Not difficult at all     phq 9 is {gen pos [id number]}   Fall Risk:    05/20/2022    1:47 PM 01/20/2022    9:21 AM 10/22/2021    1:03 PM 07/09/2021    8:07 AM 06/23/2021    1:06 PM  Fall Risk   Falls in the past year? 0 1 1 0 0  Number falls in past yr:  0 0 0 0  Injury with Fall?  0 0 0 0  Risk for fall due to : No Fall Risks Impaired balance/gait Impaired balance/gait No Fall Risks No Fall Risks  Follow up Falls prevention discussed Falls prevention discussed;Education provided Falls prevention discussed;Education provided Falls prevention discussed Falls prevention discussed      Functional Status Survey:      Assessment & Plan  *** There are no diagnoses linked to this encounter.

## 2023-03-02 ENCOUNTER — Ambulatory Visit: Payer: 59 | Admitting: Family Medicine

## 2023-03-04 ENCOUNTER — Other Ambulatory Visit: Payer: Self-pay | Admitting: Family Medicine

## 2023-03-04 DIAGNOSIS — E1165 Type 2 diabetes mellitus with hyperglycemia: Secondary | ICD-10-CM

## 2023-03-04 DIAGNOSIS — Z794 Long term (current) use of insulin: Secondary | ICD-10-CM

## 2023-03-04 NOTE — Telephone Encounter (Signed)
Medication Refill -  Most Recent Primary Care Visit:  Provider: Alba Cory  Department: [facility]  Visit Type: OFFICE VISIT  Date: 05/20/2022  Medication: metFORMIN (GLUCOPHAGE-XR) 750 MG 24 hr tablet   Has the patient contacted their pharmacy? Yes  Is this the correct pharmacy for this prescription? Yes If no, delete pharmacy and type the correct one.  This is the patient's preferred pharmacy:  [facility] [address], [place] - [address] [place] [address] Phone: [phone] Fax: [phone]   Has the prescription been filled recently? Yes  Is the patient out of the medication? Yes  Has the patient been seen for an appointment in the last year OR does the patient have an upcoming appointment? Yes  Can we respond through MyChart? Yes  Agent: Please be advised that Rx refills may take up to 3 business days. We ask that you follow-up with your pharmacy.

## 2023-03-05 ENCOUNTER — Encounter: Payer: Self-pay | Admitting: Family Medicine

## 2023-03-05 ENCOUNTER — Other Ambulatory Visit: Payer: Self-pay | Admitting: Family Medicine

## 2023-03-05 DIAGNOSIS — Z794 Long term (current) use of insulin: Secondary | ICD-10-CM

## 2023-03-05 DIAGNOSIS — M533 Sacrococcygeal disorders, not elsewhere classified: Secondary | ICD-10-CM | POA: Diagnosis not present

## 2023-03-05 DIAGNOSIS — E1165 Type 2 diabetes mellitus with hyperglycemia: Secondary | ICD-10-CM

## 2023-03-05 MED ORDER — METFORMIN HCL ER 750 MG PO TB24: ORAL_TABLET | ORAL | 0 refills | Status: DC

## 2023-03-05 NOTE — Telephone Encounter (Signed)
Requested medication (s) are due for refill today: yes  Requested medication (s) are on the active medication list: yes  Last refill:  01/15/23 #60  Future visit scheduled: yes  Notes to clinic:  overdue lab work    Requested Prescriptions  Pending Prescriptions Disp Refills   metFORMIN (GLUCOPHAGE-XR) 750 MG 24 hr tablet 60 tablet 0    Sig: TAKE 2 TABLETS (1,500 MG TOTAL) BY MOUTH EVERY DAY WITH BREAKFAST     Endocrinology:  Diabetes - Biguanides Failed - 03/04/2023  2:55 PM      Failed - HBA1C is between 0 and 7.9 and within 180 days    Hemoglobin A1C  Date Value Ref Range Status  05/20/2022 6.1 (A) 4.0 - 5.6 % Final   Hgb A1c MFr Bld  Date Value Ref Range Status  01/20/2022 6.1 (H) <5.7 % of total Hgb Final    Comment:    For someone without known diabetes, a hemoglobin  A1c value between 5.7% and 6.4% is consistent with prediabetes and should be confirmed with a  follow-up test. . For someone with known diabetes, a value <7% indicates that their diabetes is well controlled. A1c targets should be individualized based on duration of diabetes, age, comorbid conditions, and other considerations. . This assay result is consistent with an increased risk of diabetes. . Currently, no consensus exists regarding use of hemoglobin A1c for diagnosis of diabetes for children. .          Failed - Valid encounter within last 6 months    Recent Outpatient Visits           9 months ago Dyslipidemia associated with type 2 diabetes mellitus [facility])   [facility] [place], Danna Hefty, MD   1 year ago Type 2 diabetes mellitus with hyperglycemia, with long-term current use of insulin [facility])   [facility] Danelle Berry, PA-C   1 year ago Upper respiratory tract infection due to COVID-19 virus   [facility] Danelle Berry, PA-C   1 year ago Acute cough   [facility] Caro Laroche,  DO   1 year ago Dyslipidemia associated with type 2 diabetes mellitus [facility])    [facility] Alba Cory, MD       Future Appointments             In 1 month Alba Cory, MD [facility]   In 2 months Alba Cory, MD [facility]            Failed - CBC within normal limits and completed in the last 12 months    WBC  Date Value Ref Range Status  10/16/2022 6.6 4.0 - 10.5 K/uL Final   RBC  Date Value Ref Range Status  10/16/2022 4.18 3.87 - 5.11 MIL/uL Final   Hemoglobin  Date Value Ref Range Status  10/16/2022 11.9 (L) 12.0 - 15.0 g/dL Final   HCT  Date Value Ref Range Status  10/16/2022 36.2 36.0 - 46.0 % Final   MCHC  Date Value Ref Range Status  10/16/2022 32.9 30.0 - 36.0 g/dL Final   [facility]  Date Value Ref Range Status  10/16/2022 28.5 26.0 - 34.0 pg Final   MCV  Date Value Ref Range Status  10/16/2022 86.6 80.0 - 100.0 fL Final   No results found for: "PLTCOUNTKUC", "LABPLAT", "POCPLA" RDW  Date Value Ref Range Status  10/16/2022 12.3 11.5 - 15.5 % Final         Passed - Cr in normal range and within 360 days    Creat  Date Value Ref Range Status  01/20/2022 0.69 0.50 - 0.99 mg/dL Final   Creatinine, Ser  Date Value Ref Range Status  10/16/2022 0.63 0.44 - 1.00 mg/dL Final   Creatinine, Urine  Date Value Ref Range Status  01/20/2022 165 20 - 275 mg/dL Final         Passed - eGFR in normal range and within 360 days    GFR, Est African American  Date Value Ref Range Status  01/21/2016 >89 >=60 mL/min Final   GFR calc Af Amer  Date Value Ref Range Status  03/22/2017 >60 >60 mL/min Final    Comment:    (NOTE) The eGFR has been calculated using the CKD EPI equation. This calculation has not been validated in all clinical situations. eGFR's persistently <60 mL/min signify possible Chronic Kidney Disease.    GFR, Est Non African American   Date Value Ref Range Status  01/21/2016 >89 >=60 mL/min Final   GFR, Estimated  Date Value Ref Range Status  10/16/2022 >60 >60 mL/min Final    Comment:    (NOTE) Calculated using the CKD-EPI Creatinine Equation (2021)    GFR  Date Value Ref Range Status  09/02/2011 80.38 >60.00 mL/min Final   eGFR  Date Value Ref Range Status  01/20/2022 110 > OR = 60 mL/min/1.72m2 Final         Passed - B12 Level in normal range and within 720 days    Vitamin B-12  Date Value Ref Range Status  01/20/2022 579 200 - 1,100 pg/mL Final

## 2023-03-10 ENCOUNTER — Ambulatory Visit: Payer: 59 | Admitting: Physician Assistant

## 2023-03-23 ENCOUNTER — Encounter: Payer: Self-pay | Admitting: Physician Assistant

## 2023-03-23 ENCOUNTER — Ambulatory Visit (INDEPENDENT_AMBULATORY_CARE_PROVIDER_SITE_OTHER): Payer: 59 | Admitting: Physician Assistant

## 2023-03-23 VITALS — BP 122/78 | HR 90 | Resp 16 | Ht 60.0 in | Wt 172.0 lb

## 2023-03-23 DIAGNOSIS — Z794 Long term (current) use of insulin: Secondary | ICD-10-CM | POA: Diagnosis not present

## 2023-03-23 DIAGNOSIS — R5382 Chronic fatigue, unspecified: Secondary | ICD-10-CM

## 2023-03-23 DIAGNOSIS — J302 Other seasonal allergic rhinitis: Secondary | ICD-10-CM

## 2023-03-23 DIAGNOSIS — E1165 Type 2 diabetes mellitus with hyperglycemia: Secondary | ICD-10-CM

## 2023-03-23 DIAGNOSIS — E1169 Type 2 diabetes mellitus with other specified complication: Secondary | ICD-10-CM | POA: Diagnosis not present

## 2023-03-23 DIAGNOSIS — D509 Iron deficiency anemia, unspecified: Secondary | ICD-10-CM | POA: Diagnosis not present

## 2023-03-23 DIAGNOSIS — E785 Hyperlipidemia, unspecified: Secondary | ICD-10-CM

## 2023-03-23 DIAGNOSIS — Z1231 Encounter for screening mammogram for malignant neoplasm of breast: Secondary | ICD-10-CM | POA: Diagnosis not present

## 2023-03-23 MED ORDER — RYALTRIS 665-25 MCG/ACT NA SUSP: 2.0000 | Freq: Two times a day (BID) | NASAL | 0 refills | Status: DC

## 2023-03-23 NOTE — Progress Notes (Signed)
Established Patient Office Visit  Name: Donna Haas   MRN: [id number]    DOB: [birth year]   Date:03/23/2023  Today's Provider: Jacquelin Hawking, MHS, PA-C Introduced myself to the patient as a PA-C and provided education on APPs in clinical practice.         Subjective  Chief Complaint  Chief Complaint  Patient presents with   Follow-up   Diabetes    HPI   Diabetes, Type 2 - Last A1c 6.1% - Medications: Metformin 1500 mg PO every day, Ozempic 2 mg weekly dose  - Compliance: good  - Checking BG at home: She is rarely checks unless she feels like she is having a low  She gets a low 1-2 times per week (usually in 60s)  - Diet:  she reports she is eating regularly - about 5 times per day with small meals  - Exercise: She is trying to exercise but this is limited due to back injury  - Eye exam: she had eye exam at [facility] eye in [place] - request records  - Foot exam: complete at follow up  - Microalbumin: ordered today  - Statin: on Zetia  - PNA vaccine: not UTD - discussed recommendations and provided education materials  - Denies symptoms of hypoglycemia, polyuria, polydipsia, numbness extremities, foot ulcers/trauma  HYPERLIPIDEMIA  Satisfied with current treatment?  yes Side effects:  no Medication compliance: good compliance Past cholesterol meds: Zetia 10 mg PO QD Supplements: none Aspirin:  no The 10-year ASCVD risk score (Arnett DK, et al., 2019) is: 1.8%   Values used to calculate the score:     Age: 47 years     Sex: Female     Is Non-Hispanic African American: No     Diabetic: Yes     Tobacco smoker: No     Systolic Blood Pressure: 110 mmHg     Is BP treated: No     HDL Cholesterol: 38 mg/dL     Total Cholesterol: 181 mg/dL    She reports this AM she started to have itchy eyes, runny nose, postnasal drainage and coughing  She took Claritin and Benadryl to assist with this    She reports decreased energy and ongoing fatigue States she does take  an iron supplement and sublingual B12 but this is not helping   Patient Active Problem List   Diagnosis Date Noted   Type 2 diabetes mellitus with hyperglycemia, with long-term current use of insulin (HCC) 03/23/2023   Irritable bowel syndrome with diarrhea 05/20/2022   Statin myopathy 05/20/2022   Seasonal allergic rhinitis 01/20/2022   Migraine without aura and responsive to treatment 11/20/2020   Cervical nerve root disorder 11/20/2020   B12 deficiency 11/20/2020   Dyslipidemia associated with type 2 diabetes mellitus (HCC) 06/14/2020   Anxiety, generalized 12/13/2019   Binge eating disorder 08/23/2019   Mild intermittent asthma without complication 08/23/2019   Morbid obesity (HCC) 01/25/2016   Barrett's esophagus determined by biopsy 01/21/2016   Chronic fatigue 01/21/2016   Carpal tunnel syndrome, bilateral 01/21/2016   Hx gestational diabetes 01/21/2016   Neck pain 09/08/2012   INSOMNIA 09/21/2008   NEPHROLITHIASIS, HX OF 04/17/2008   THYROID NODULE 01/12/2008   GERD 05/30/2007   ENDOMETRIOSIS OF OTHER SPECIFIED SITES 05/30/2007   Systemic lupus erythematosus (HCC) 05/30/2007   HERNIATED LUMBAR DISC 05/30/2007   Backache 05/30/2007    Past Surgical History:  Procedure Laterality Date   APPENDECTOMY  CESAREAN SECTION     x2   CESAREAN SECTION WITH BILATERAL TUBAL LIGATION Bilateral 08/30/2015   Procedure: CESAREAN SECTION WITH BILATERAL TUBAL LIGATION;  Surgeon: Elenora Fender Ward, MD;  Location: [facility] ORS;  Service: Obstetrics;  Laterality: Bilateral;   CHOLECYSTECTOMY     LAPAROSCOPY     x3   THYROID CYST EXCISION     Dr. Rolly Salter, [place]    Family History  Problem Relation Age of Onset   Depression Mother        Bi-polar   Asthma Mother    Allergies Mother    Sleep apnea Mother    Allergies Sister    Diabetes Sister    Rheum arthritis Maternal Grandmother    Hypertension Paternal Grandmother    Macular degeneration Paternal Grandmother    Hyperlipidemia  Other    Hypertension Other    Heart disease Other     Social History   Tobacco Use   Smoking status: Never   Smokeless tobacco: Never  Substance Use Topics   Alcohol use: No    Alcohol/week: 1.0 - 2.0 standard drink of alcohol    Types: 1 - 2 Standard drinks or equivalent per week    Comment: discontinued use during pregnancy     Current Outpatient Medications:    ACCU-CHEK GUIDE test strip, USE UP TO 4 TIMES DAILY AS DIRECTED, Disp: , Rfl:    Accu-Chek Softclix Lancets lancets, SMARTSIG:Topical 1-4 Times Daily, Disp: , Rfl:    ALPRAZolam (XANAX) 1 MG tablet, Take 1 mg by mouth daily as needed., Disp: , Rfl:    blood glucose meter kit and supplies, Dispense based on patient and insurance preference. Use up to four times daily as directed. (FOR ICD-10 E10.9, E11.9)., Disp: 1 each, Rfl: 0   Cyanocobalamin (B-12) 1000 MCG SUBL, Place under the tongue., Disp: , Rfl:    escitalopram (LEXAPRO) 20 MG tablet, Take 10 tablets by mouth daily., Disp: , Rfl:    esomeprazole (NEXIUM) 20 MG capsule, Take 20 mg by mouth daily. , Disp: , Rfl:    ezetimibe (ZETIA) 10 MG tablet, Take 1 tablet (10 mg total) by mouth daily., Disp: 90 tablet, Rfl: 3   ferrous sulfate 325 (65 FE) MG tablet, Take 325 mg by mouth daily., Disp: , Rfl:    loratadine (CLARITIN) 10 MG tablet, Take 10 mg by mouth daily., Disp: , Rfl:    Magnesium 250 MG TABS, Take 1 tablet by mouth daily., Disp: , Rfl:    metFORMIN (GLUCOPHAGE) 500 MG tablet, Take 1 tablet (500 mg total) by mouth 2 (two) times daily with a meal., Disp: 180 tablet, Rfl: 0   Olopatadine-Mometasone (RYALTRIS) 665-25 MCG/ACT SUSP, Place 2 puffs into the nose in the morning and at bedtime., Disp: 29 g, Rfl: 0   Semaglutide, 2 MG/DOSE, (OZEMPIC, 2 MG/DOSE,) 8 MG/3ML SOPN, INJECT 2 MG AS DIRECTED ONCE A WEEK., Disp: 8 mL, Rfl: 0   zolpidem (AMBIEN) 10 MG tablet, Take 10 mg by mouth at bedtime., Disp: , Rfl:   Allergies  Allergen Reactions   Compazine  [Prochlorperazine] Anaphylaxis   Prednisone Anaphylaxis   Prochlorperazine Edisylate Anaphylaxis   Etodolac     Other reaction(s): Unknown   Farxiga [Dapagliflozin]     Constipation and headaches   Hydrocodone-Acetaminophen Nausea Only   Morphine And Codeine Nausea Only and Other (See Comments)    Mood swings, headache    I personally reviewed active problem list, medication list, allergies, health maintenance, lab results with  the patient/caregiver today.   ROS  See HPI for relevant ROS   Objective  Vitals:   03/23/23 1400  BP: 122/78  Pulse: 90  Resp: 16  SpO2: 99%  Weight: 172 lb (78 kg)  Height: 5' (1.524 m)    Body mass index is 33.59 kg/m.  Physical Exam Vitals reviewed.  Constitutional:      General: She is awake.     Appearance: Normal appearance. She is well-developed and well-groomed.  HENT:     Head: Normocephalic and atraumatic.     Right Ear: Hearing, tympanic membrane and ear canal normal.     Left Ear: Hearing, tympanic membrane and ear canal normal.     Mouth/Throat:     Lips: Pink.     Mouth: Mucous membranes are moist.     Pharynx: Uvula midline. No pharyngeal swelling, oropharyngeal exudate, posterior oropharyngeal erythema or uvula swelling.  Cardiovascular:     Rate and Rhythm: Normal rate and regular rhythm.     Pulses: Normal pulses.          Radial pulses are 2+ on the right side and 2+ on the left side.     Heart sounds: Normal heart sounds. No murmur heard.    No friction rub. No gallop.  Pulmonary:     Effort: Pulmonary effort is normal.     Breath sounds: Normal breath sounds. No decreased air movement. No decreased breath sounds, wheezing, rhonchi or rales.  Musculoskeletal:     Cervical back: Normal range of motion.     Right lower leg: No edema.     Left lower leg: No edema.  Lymphadenopathy:     Head:     Right side of head: No submental, submandibular or preauricular adenopathy.     Left side of head: No submental,  submandibular or preauricular adenopathy.     Cervical:     Right cervical: No superficial cervical adenopathy.    Left cervical: No superficial cervical adenopathy.     Upper Body:     Right upper body: No supraclavicular adenopathy.     Left upper body: No supraclavicular adenopathy.  Neurological:     General: No focal deficit present.     Mental Status: She is alert and oriented to person, place, and time. Mental status is at baseline.     GCS: GCS eye subscore is 4. GCS verbal subscore is 5. GCS motor subscore is 6.  Psychiatric:        Attention and Perception: Attention and perception normal.        Mood and Affect: Mood and affect normal.        Speech: Speech normal.        Behavior: Behavior normal. Behavior is cooperative.        Thought Content: Thought content normal.        Cognition and Memory: Cognition normal.      Recent Results (from the past 2160 hours)  HgB A1c     Status: Abnormal   Collection Time: 03/23/23  2:28 PM  Result Value Ref Range   Hgb A1c MFr Bld 5.8 (H) <5.7 % of total Hgb    Comment: For someone without known diabetes, a hemoglobin  A1c value between 5.7% and 6.4% is consistent with prediabetes and should be confirmed with a  follow-up test. . For someone with known diabetes, a value <7% indicates that their diabetes is well controlled. A1c targets should be individualized based on duration of diabetes,  age, comorbid conditions, and other considerations. . This assay result is consistent with an increased risk of diabetes. . Currently, no consensus exists regarding use of hemoglobin A1c for diagnosis of diabetes for children. .    Mean Plasma Glucose 120 mg/dL   eAG (mmol/L) 6.6 mmol/L  Urine Microalbumin w/creat. ratio     Status: None   Collection Time: 03/23/23  2:28 PM  Result Value Ref Range   Creatinine, Urine 49 20 - 275 mg/dL   Microalb, Ur 0.2 mg/dL    Comment: Reference Range Not established    Microalb Creat Ratio 4  <30 mg/g creat    Comment: . The ADA defines abnormalities in albumin excretion as follows: Marland Kitchen Albuminuria Category        Result (mg/g creatinine) . Normal to Mildly increased   <30 Moderately increased         30-299  Severely increased           > OR = 300 . The ADA recommends that at least two of three specimens collected within a 3-6 month period be abnormal before considering a patient to be within a diagnostic category.   Lipid Profile     Status: Abnormal   Collection Time: 03/23/23  2:28 PM  Result Value Ref Range   Cholesterol 181 <200 mg/dL   HDL 38 (L) > OR = 50 mg/dL   Triglycerides 161 (H) <150 mg/dL    Comment: . If a non-fasting specimen was collected, consider repeat triglyceride testing on a fasting specimen if clinically indicated.  Perry Mount et al. J. of Clin. Lipidol. 2015;9:129-169. Marland Kitchen    LDL Cholesterol (Calc) 107 (H) mg/dL (calc)    Comment: Reference range: <100 . Desirable range <100 mg/dL for primary prevention;   <70 mg/dL for patients with CHD or diabetic patients  with > or = 2 CHD risk factors. Marland Kitchen LDL-C is now calculated using the Martin-Hopkins  calculation, which is a validated novel method providing  better accuracy than the Friedewald equation in the  estimation of LDL-C.  Horald Pollen et al. Lenox Ahr. 0960;454(09): 2061-2068  (http://education.[facility].com/faq/FAQ164)    Total CHOL/HDL Ratio 4.8 <5.0 (calc)   Non-HDL Cholesterol (Calc) 143 (H) <130 mg/dL (calc)    Comment: For patients with diabetes plus 1 major ASCVD risk  factor, treating to a non-HDL-C goal of <100 mg/dL  (LDL-C of <81 mg/dL) is considered a therapeutic  option.   Iron, TIBC and Ferritin Panel     Status: Abnormal   Collection Time: 03/23/23  2:28 PM  Result Value Ref Range   Iron 33 (L) 40 - 190 mcg/dL   TIBC 191 478 - 295 mcg/dL (calc)   %SAT 8 (L) 16 - 45 % (calc)   Ferritin 17 16 - 232 ng/mL  COMPLETE METABOLIC PANEL WITH GFR     Status: None    Collection Time: 03/23/23  2:28 PM  Result Value Ref Range   Glucose, Bld 88 65 - 99 mg/dL    Comment: .            Fasting reference interval .    BUN 11 7 - 25 mg/dL   Creat 6.21 3.08 - 6.57 mg/dL   eGFR 86 > OR = 60 QI/ONG/2.95M8   BUN/Creatinine Ratio SEE NOTE: 6 - 22 (calc)    Comment:    Not Reported: BUN and Creatinine are within    reference range. .    Sodium 136 135 - 146 mmol/L   Potassium  4.8 3.5 - 5.3 mmol/L   Chloride 104 98 - 110 mmol/L   CO2 25 20 - 32 mmol/L   Calcium 9.4 8.6 - 10.2 mg/dL   Total Protein 7.0 6.1 - 8.1 g/dL   Albumin 4.3 3.6 - 5.1 g/dL   Globulin 2.7 1.9 - 3.7 g/dL (calc)   AG Ratio 1.6 1.0 - 2.5 (calc)   Total Bilirubin 0.2 0.2 - 1.2 mg/dL   Alkaline phosphatase (APISO) 71 31 - 125 U/L   AST 14 10 - 35 U/L   ALT 14 6 - 29 U/L  CBC with Differential/Platelet     Status: None   Collection Time: 03/23/23  2:28 PM  Result Value Ref Range   WBC 9.2 3.8 - 10.8 Thousand/uL   RBC 4.23 3.80 - 5.10 Million/uL   Hemoglobin 12.1 11.7 - 15.5 g/dL   HCT 21.3 08.6 - 57.8 %   MCV 86.8 80.0 - 100.0 fL   MCH 28.6 27.0 - 33.0 pg   MCHC 33.0 32.0 - 36.0 g/dL    Comment: For adults, a slight decrease in the calculated MCHC value (in the range of 30 to 32 g/dL) is most likely not clinically significant; however, it should be interpreted with caution in correlation with other red cell parameters and the patient's clinical condition.    RDW 13.0 11.0 - 15.0 %   Platelets 309 140 - 400 Thousand/uL   MPV 10.6 7.5 - 12.5 fL   Neutro Abs 6,431 1,500 - 7,800 cells/uL   Absolute Lymphocytes 1,739 850 - 3,900 cells/uL   Absolute Monocytes 773 200 - 950 cells/uL   Eosinophils Absolute 193 15 - 500 cells/uL   Basophils Absolute 64 0 - 200 cells/uL   Neutrophils Relative % 69.9 %   Total Lymphocyte 18.9 %   Monocytes Relative 8.4 %   Eosinophils Relative 2.1 %   Basophils Relative 0.7 %  Vitamin B12     Status: Abnormal   Collection Time: 03/23/23  2:28 PM   Result Value Ref Range   Vitamin B-12 1,136 (H) 200 - 1,100 pg/mL     PHQ2/9:    03/23/2023    2:00 PM 05/20/2022    1:47 PM 01/20/2022    9:21 AM 10/22/2021    1:03 PM 07/09/2021    8:07 AM  Depression screen PHQ 2/9  Decreased Interest 0 0 0 0 0  Down, Depressed, Hopeless 0 0 0 0 0  PHQ - 2 Score 0 0 0 0 0  Altered sleeping 0 0 0 0 0  Tired, decreased energy 3 0 0 0 0  Change in appetite 0 0 0 0 0  Feeling bad or failure about yourself  0 0 0 0 0  Trouble concentrating 0 0 0 0 0  Moving slowly or fidgety/restless 0 0 0 0 0  Suicidal thoughts 0 0 0 0 0  PHQ-9 Score 3 0 0 0 0  Difficult doing work/chores   Not difficult at all Not difficult at all Not difficult at all      Fall Risk:    03/23/2023    2:00 PM 05/20/2022    1:47 PM 01/20/2022    9:21 AM 10/22/2021    1:03 PM 07/09/2021    8:07 AM  Fall Risk   Falls in the past year? 0 0 1 1 0  Number falls in past yr: 0  0 0 0  Injury with Fall? 0  0 0 0  Risk for fall due  to : No Fall Risks No Fall Risks Impaired balance/gait Impaired balance/gait No Fall Risks  Follow up Falls prevention discussed Falls prevention discussed Falls prevention discussed;Education provided Falls prevention discussed;Education provided Falls prevention discussed      Functional Status Survey:      Assessment & Plan  Problem List Items Addressed This Visit       Respiratory   Seasonal allergic rhinitis   Chronic, ongoing  Reports she no longer uses nasal spray due to side effects  Will try Ryaltris- patient is amenable to trial - discussed active ingredients and allergic reaction precautions with her prior to sending script  Discussed administration and dosing with her Continue antihistamines  Follow up as needed for persistent or progressing symptoms        Relevant Medications   Olopatadine-Mometasone (RYALTRIS) 665-25 MCG/ACT SUSP     Endocrine   Dyslipidemia associated with type 2 diabetes mellitus (HCC)   Chronic,  historic condition  Currently taking Zetia 10 mg PO every day and appears to be tolerating well Recheck lipid panel today Results to dictate further management  Continue current regimen for now Follow up in 6 months or sooner if concerns arise        Relevant Medications   metFORMIN (GLUCOPHAGE) 500 MG tablet   Other Relevant Orders   Lipid Profile (Completed)   Type 2 diabetes mellitus with hyperglycemia, with long-term current use of insulin (HCC) - Primary   Chronic, historic condition Appears well managed on current regimen comprised of metformin 1500 mg PO QD, Ozempic 2 mg weekly injection  She reports some low BG at home  during several times per week even though she is eating regularly Most recent A1c was 6.1% - may be able to decrease metformin to 500 mg pO BID to help with lows given good control- Recheck A1c today for guidance. Results to dictate further management  For now continue current regimen Follow up in 3 months or sooner if concerns arise        Relevant Medications   metFORMIN (GLUCOPHAGE) 500 MG tablet   Other Relevant Orders   HgB A1c (Completed)   Urine Microalbumin w/creat. ratio (Completed)     Other   Chronic fatigue   Chronic, historic condition Patient reports previous history of iron deficiency anemia and low B12. She is currently taking B12 supplements. Recheck B12, iron panel, CBC, CMP for workup. Results to dictate further management        Relevant Orders   CBC w/Diff/Platelet   Iron, TIBC and Ferritin Panel   B12   COMPLETE METABOLIC PANEL WITH GFR   Ambulatory referral to Hematology / Oncology   Other Visit Diagnoses       Breast cancer screening by mammogram       Relevant Orders   MM 3D SCREENING MAMMOGRAM BILATERAL BREAST     Iron deficiency anemia, unspecified iron deficiency anemia type       Relevant Orders   Ambulatory referral to Hematology / Oncology        No follow-ups on file.   I, Jackson Coffield E Hicks Feick, PA-C, have  reviewed all documentation for this visit. The documentation on 03/26/23 for the exam, diagnosis, procedures, and orders are all accurate and complete.   Jacquelin Hawking, MHS, PA-C [facility]

## 2023-03-24 ENCOUNTER — Encounter: Payer: Self-pay | Admitting: Physician Assistant

## 2023-03-24 ENCOUNTER — Other Ambulatory Visit: Payer: Self-pay | Admitting: Nurse Practitioner

## 2023-03-24 DIAGNOSIS — Z794 Long term (current) use of insulin: Secondary | ICD-10-CM

## 2023-03-24 DIAGNOSIS — M533 Sacrococcygeal disorders, not elsewhere classified: Secondary | ICD-10-CM | POA: Diagnosis not present

## 2023-03-24 DIAGNOSIS — E119 Type 2 diabetes mellitus without complications: Secondary | ICD-10-CM | POA: Diagnosis not present

## 2023-03-24 DIAGNOSIS — E1165 Type 2 diabetes mellitus with hyperglycemia: Secondary | ICD-10-CM

## 2023-03-24 LAB — COMPLETE METABOLIC PANEL WITHOUT GFR
AG Ratio: 1.6 (calc) (ref 1.0–2.5)
ALT: 14 U/L (ref 6–29)
AST: 14 U/L (ref 10–35)
Albumin: 4.3 g/dL (ref 3.6–5.1)
Alkaline phosphatase (APISO): 71 U/L (ref 31–125)
BUN: 11 mg/dL (ref 7–25)
CO2: 25 mmol/L (ref 20–32)
Calcium: 9.4 mg/dL (ref 8.6–10.2)
Chloride: 104 mmol/L (ref 98–110)
Creat: 0.85 mg/dL (ref 0.50–0.99)
Globulin: 2.7 g/dL (ref 1.9–3.7)
Glucose, Bld: 88 mg/dL (ref 65–99)
Potassium: 4.8 mmol/L (ref 3.5–5.3)
Sodium: 136 mmol/L (ref 135–146)
Total Bilirubin: 0.2 mg/dL (ref 0.2–1.2)
Total Protein: 7 g/dL (ref 6.1–8.1)
eGFR: 86 mL/min/{1.73_m2}

## 2023-03-24 LAB — CBC WITH DIFFERENTIAL/PLATELET
Absolute Lymphocytes: 1739 {cells}/uL (ref 850–3900)
Absolute Monocytes: 773 {cells}/uL (ref 200–950)
Basophils Absolute: 64 {cells}/uL (ref 0–200)
Basophils Relative: 0.7 %
Eosinophils Absolute: 193 {cells}/uL (ref 15–500)
Eosinophils Relative: 2.1 %
HCT: 36.7 % (ref 35.0–45.0)
Hemoglobin: 12.1 g/dL (ref 11.7–15.5)
MCH: 28.6 pg (ref 27.0–33.0)
MCHC: 33 g/dL (ref 32.0–36.0)
MCV: 86.8 fL (ref 80.0–100.0)
MPV: 10.6 fL (ref 7.5–12.5)
Monocytes Relative: 8.4 %
Neutro Abs: 6431 {cells}/uL (ref 1500–7800)
Neutrophils Relative %: 69.9 %
Platelets: 309 10*3/uL (ref 140–400)
RBC: 4.23 Million/uL (ref 3.80–5.10)
RDW: 13 % (ref 11.0–15.0)
Total Lymphocyte: 18.9 %
WBC: 9.2 10*3/uL (ref 3.8–10.8)

## 2023-03-24 LAB — LIPID PANEL
Cholesterol: 181 mg/dL
HDL: 38 mg/dL — ABNORMAL LOW
LDL Cholesterol (Calc): 107 mg/dL — ABNORMAL HIGH
Non-HDL Cholesterol (Calc): 143 mg/dL — ABNORMAL HIGH
Total CHOL/HDL Ratio: 4.8 (calc)
Triglycerides: 249 mg/dL — ABNORMAL HIGH

## 2023-03-24 LAB — MICROALBUMIN / CREATININE URINE RATIO
Creatinine, Urine: 49 mg/dL (ref 20–275)
Microalb Creat Ratio: 4 mg/g{creat}
Microalb, Ur: 0.2 mg/dL

## 2023-03-24 LAB — IRON,TIBC AND FERRITIN PANEL
%SAT: 8 % — ABNORMAL LOW (ref 16–45)
Ferritin: 17 ng/mL (ref 16–232)
Iron: 33 ug/dL — ABNORMAL LOW (ref 40–190)
TIBC: 417 ug/dL (ref 250–450)

## 2023-03-24 LAB — HEMOGLOBIN A1C
Hgb A1c MFr Bld: 5.8 %{Hb} — ABNORMAL HIGH
Mean Plasma Glucose: 120 mg/dL
eAG (mmol/L): 6.6 mmol/L

## 2023-03-24 LAB — VITAMIN B12: Vitamin B-12: 1136 pg/mL — ABNORMAL HIGH (ref 200–1100)

## 2023-03-25 NOTE — Assessment & Plan Note (Signed)
Chronic, ongoing  Reports she no longer uses nasal spray due to side effects  Will try Ryaltris- patient is amenable to trial - discussed active ingredients and allergic reaction precautions with her prior to sending script  Discussed administration and dosing with her Continue antihistamines  Follow up as needed for persistent or progressing symptoms

## 2023-03-25 NOTE — Assessment & Plan Note (Signed)
Chronic, historic condition Appears well managed on current regimen comprised of metformin 1500 mg PO QD, Ozempic 2 mg weekly injection  She reports some low BG at home  during several times per week even though she is eating regularly Most recent A1c was 6.1% - may be able to decrease metformin to 500 mg pO BID to help with lows given good control- Recheck A1c today for guidance. Results to dictate further management  For now continue current regimen Follow up in 3 months or sooner if concerns arise

## 2023-03-25 NOTE — Telephone Encounter (Signed)
Requested Prescriptions  Pending Prescriptions Disp Refills   metFORMIN (GLUCOPHAGE-XR) 750 MG 24 hr tablet [Pharmacy Med Name: METFORMIN HCL ER 750 MG TABLET] 180 tablet 1    Sig: TAKE 2 TABLETS (1,500 MG TOTAL) BY MOUTH EVERY DAY WITH BREAKFAST     Endocrinology:  Diabetes - Biguanides Failed - 03/25/2023  9:45 AM      Failed - B12 Level in normal range and within 720 days    Vitamin B-12  Date Value Ref Range Status  03/23/2023 1,136 (H) 200 - 1,100 pg/mL Final         Passed - Cr in normal range and within 360 days    Creat  Date Value Ref Range Status  03/23/2023 0.85 0.50 - 0.99 mg/dL Final   Creatinine, Urine  Date Value Ref Range Status  03/23/2023 49 20 - 275 mg/dL Final         Passed - HBA1C is between 0 and 7.9 and within 180 days    Hgb A1c MFr Bld  Date Value Ref Range Status  03/23/2023 5.8 (H) <5.7 % of total Hgb Final    Comment:    For someone without known diabetes, a hemoglobin  A1c value between 5.7% and 6.4% is consistent with prediabetes and should be confirmed with a  follow-up test. . For someone with known diabetes, a value <7% indicates that their diabetes is well controlled. A1c targets should be individualized based on duration of diabetes, age, comorbid conditions, and other considerations. . This assay result is consistent with an increased risk of diabetes. . Currently, no consensus exists regarding use of hemoglobin A1c for diagnosis of diabetes for children. .          Passed - eGFR in normal range and within 360 days    GFR, Est African American  Date Value Ref Range Status  01/21/2016 >89 >=60 mL/min Final   GFR calc Af Amer  Date Value Ref Range Status  03/22/2017 >60 >60 mL/min Final    Comment:    (NOTE) The eGFR has been calculated using the CKD EPI equation. This calculation has not been validated in all clinical situations. eGFR's persistently <60 mL/min signify possible Chronic Kidney Disease.    GFR, Est Non  African American  Date Value Ref Range Status  01/21/2016 >89 >=60 mL/min Final   GFR, Estimated  Date Value Ref Range Status  10/16/2022 >60 >60 mL/min Final    Comment:    (NOTE) Calculated using the CKD-EPI Creatinine Equation (2021)    GFR  Date Value Ref Range Status  09/02/2011 80.38 >60.00 mL/min Final   eGFR  Date Value Ref Range Status  03/23/2023 86 > OR = 60 mL/min/1.4m2 Final         Passed - Valid encounter within last 6 months    Recent Outpatient Visits           2 days ago Type 2 diabetes mellitus with hyperglycemia, with long-term current use of insulin (HCC)   [place] [facility] Mecum, Erin E, PA-C   10 months ago Dyslipidemia associated with type 2 diabetes mellitus [facility])   [facility] [place], Danna Hefty, MD   1 year ago Type 2 diabetes mellitus with hyperglycemia, with long-term current use of insulin [facility])   [place] [facility] Danelle Berry, PA-C   1 year ago Upper respiratory tract infection due to COVID-19 virus   [facility] Danelle Berry, [place]  1 year ago Acute cough   [facility] Caro Laroche, DO       Future Appointments             In 3 weeks Alba Cory, MD [facility], [facility]   In 2 months Alba Cory, MD [facility]            Passed - CBC within normal limits and completed in the last 12 months    WBC  Date Value Ref Range Status  03/23/2023 9.2 3.8 - 10.8 Thousand/uL Final   RBC  Date Value Ref Range Status  03/23/2023 4.23 3.80 - 5.10 Million/uL Final   Hemoglobin  Date Value Ref Range Status  03/23/2023 12.1 11.7 - 15.5 g/dL Final   HCT  Date Value Ref Range Status  03/23/2023 36.7 35.0 - 45.0 % Final   MCHC  Date Value Ref Range Status  03/23/2023 33.0 32.0 - 36.0 g/dL Final    Comment:    For adults, a slight decrease in the  calculated MCHC value (in the range of 30 to 32 g/dL) is most likely not clinically significant; however, it should be interpreted with caution in correlation with other red cell parameters and the patient's clinical condition.    [facility]  Date Value Ref Range Status  03/23/2023 28.6 27.0 - 33.0 pg Final   MCV  Date Value Ref Range Status  03/23/2023 86.8 80.0 - 100.0 fL Final   No results found for: "PLTCOUNTKUC", "LABPLAT", "POCPLA" RDW  Date Value Ref Range Status  03/23/2023 13.0 11.0 - 15.0 % Final

## 2023-03-25 NOTE — Assessment & Plan Note (Addendum)
Chronic, historic condition  Currently taking Zetia 10 mg PO every day and appears to be tolerating well Recheck lipid panel today Results to dictate further management  Continue current regimen for now Follow up in 6 months or sooner if concerns arise

## 2023-03-26 ENCOUNTER — Encounter: Payer: Self-pay | Admitting: Physician Assistant

## 2023-03-26 MED ORDER — METFORMIN HCL 500 MG PO TABS: 500.0000 mg | ORAL_TABLET | Freq: Two times a day (BID) | ORAL | 0 refills | Status: DC

## 2023-03-26 NOTE — Assessment & Plan Note (Signed)
Chronic, historic condition Patient reports previous history of iron deficiency anemia and low B12. She is currently taking B12 supplements. Recheck B12, iron panel, CBC, CMP for workup. Results to dictate further management

## 2023-03-26 NOTE — Progress Notes (Signed)
Your labs are back Your A1c has improved to 5.8%.  At this time I think we could probably reduce your metformin to 500 mg twice per day.  I have sent in a new prescription to reflect this.  Hopefully this will help with your low blood sugars. Your cholesterol is elevated.  Please continue to take your Zetia as directed.  Please also reduce saturated fat intake and make sure you are exercising regularly throughout the week Your iron levels were low.  This could be a potential cause of your ongoing fatigue.  Your B12 was above normal. Your urine microalbumin was in normal ranges Your electrolytes, liver and kidney function are in normal ranges Your CBC was overall normal, no signs of anemia. I will go ahead and place a referral to hematology to discuss iron infusions.  Please continue your medications as directed and pick up your new metformin dosing once it is available.  Please let us know if you have further questions or concerns and follow-up in 3 months

## 2023-03-29 ENCOUNTER — Encounter: Payer: Self-pay | Admitting: Family Medicine

## 2023-04-08 ENCOUNTER — Inpatient Hospital Stay: Payer: 59 | Attending: Internal Medicine | Admitting: Internal Medicine

## 2023-04-08 ENCOUNTER — Encounter: Payer: Self-pay | Admitting: Internal Medicine

## 2023-04-08 ENCOUNTER — Inpatient Hospital Stay: Payer: 59

## 2023-04-08 VITALS — BP 137/90 | HR 84 | Temp 97.7°F | Resp 18 | Wt 168.0 lb

## 2023-04-08 DIAGNOSIS — Z79899 Other long term (current) drug therapy: Secondary | ICD-10-CM | POA: Diagnosis not present

## 2023-04-08 DIAGNOSIS — K219 Gastro-esophageal reflux disease without esophagitis: Secondary | ICD-10-CM | POA: Insufficient documentation

## 2023-04-08 DIAGNOSIS — N809 Endometriosis, unspecified: Secondary | ICD-10-CM | POA: Insufficient documentation

## 2023-04-08 DIAGNOSIS — D509 Iron deficiency anemia, unspecified: Secondary | ICD-10-CM | POA: Diagnosis present

## 2023-04-08 DIAGNOSIS — E611 Iron deficiency: Secondary | ICD-10-CM | POA: Insufficient documentation

## 2023-04-08 DIAGNOSIS — F419 Anxiety disorder, unspecified: Secondary | ICD-10-CM | POA: Diagnosis not present

## 2023-04-08 DIAGNOSIS — D649 Anemia, unspecified: Secondary | ICD-10-CM

## 2023-04-08 NOTE — Progress Notes (Signed)
[facility]  [phone] [phone]  ID: Donna Haas OB: Jul 31, 1975  MR#: [id number]  XBM#:[id number]  Patient Care Team: Alba Cory, MD as PCP - General (Family Medicine) [facility]  REFERRING PROVIDER: Jacquelin Hawking, PA  REASON FOR REFERRAL: Iron deficiency anemia  HPI: Donna Haas is a 47 y.o. female with past medical history of plantar fasciitis, GERD, endometriosis, anxiety, migraines referred to hematology for iron deficiency anemia.  Patient seen by PCP for regular testing.  She complained of fatigue.  Labs reviewed.  WBC 9.2, hemoglobin 12.1, platelets 309.  CMP largely unremarkable.  Ferritin 17, saturation 8% with iron of 33.  Vitamin B12 1136.  Upper endoscopy from 2016 showed nonerosive gastritis.  Otherwise normal. Long standing. Anemia,  Menstrual cycle- 3 super heavy days. Tampons change every 30 mins to hour. Irregular. Years. Seen Obgyn endometriosis.  Ibuprofen - as needed. Back injury. Everyday.  Diarrhea = chronic. Several years. Seen GI in the past.  Colonoscopy/ endoscopy when 21. About 10 years ago. No report available.  Fatigue - couple of years. Lately worse.    REVIEW OF SYSTEMS:   ROS  As per HPI. Otherwise, a complete review of systems is negative.  PAST MEDICAL HISTORY: Past Medical History:  Diagnosis Date   Anxiety    Back pain    Endometriosis    GERD (gastroesophageal reflux disease)    Gestational diabetes    Lumbar herniated disc    Lupus    Migraines    Morbid obesity (HCC) 01/25/2016   Nephrolithiasis    Obesity    Pancreatitis    Plantar fasciitis    Thyroid cyst     PAST SURGICAL HISTORY: Past Surgical History:  Procedure Laterality Date   APPENDECTOMY     CESAREAN SECTION     x2   CESAREAN SECTION WITH BILATERAL TUBAL LIGATION Bilateral 08/30/2015   Procedure: CESAREAN SECTION WITH BILATERAL TUBAL LIGATION;  Surgeon: Elenora Fender Ward, MD;  Location: [facility] ORS;  Service:  Obstetrics;  Laterality: Bilateral;   CHOLECYSTECTOMY     LAPAROSCOPY     x3   THYROID CYST EXCISION     Dr. Rolly Salter, [place]    FAMILY HISTORY: Family History  Problem Relation Age of Onset   Depression Mother        Bi-polar   Asthma Mother    Allergies Mother    Sleep apnea Mother    Allergies Sister    Diabetes Sister    Rheum arthritis Maternal Grandmother    Hypertension Paternal Grandmother    Macular degeneration Paternal Grandmother    Hyperlipidemia Other    Hypertension Other    Heart disease Other     HEALTH MAINTENANCE: Social History   Tobacco Use   Smoking status: Never   Smokeless tobacco: Never  Vaping Use   Vaping status: Never Used  Substance Use Topics   Alcohol use: No    Alcohol/week: 1.0 - 2.0 standard drink of alcohol    Types: 1 - 2 Standard drinks or equivalent per week    Comment: discontinued use during pregnancy   Drug use: No     Allergies  Allergen Reactions   Compazine [Prochlorperazine] Anaphylaxis   Prednisone Anaphylaxis   Prochlorperazine Edisylate Anaphylaxis   Etodolac     Other reaction(s): Unknown   Farxiga [Dapagliflozin]     Constipation and headaches   Hydrocodone-Acetaminophen Nausea Only   Morphine And Codeine Nausea Only and Other (See Comments)  Mood swings, headache    Current Outpatient Medications  Medication Sig Dispense Refill   ACCU-CHEK GUIDE test strip USE UP TO 4 TIMES DAILY AS DIRECTED     Accu-Chek Softclix Lancets lancets SMARTSIG:Topical 1-4 Times Daily     ALPRAZolam (XANAX) 1 MG tablet Take 1 mg by mouth daily as needed.     blood glucose meter kit and supplies Dispense based on patient and insurance preference. Use up to four times daily as directed. (FOR ICD-10 E10.9, E11.9). 1 each 0   Cyanocobalamin (B-12) 1000 MCG SUBL Place under the tongue.     escitalopram (LEXAPRO) 20 MG tablet Take 10 tablets by mouth daily.     esomeprazole (NEXIUM) 20 MG capsule Take 20 mg by mouth daily.       ezetimibe (ZETIA) 10 MG tablet Take 1 tablet (10 mg total) by mouth daily. 90 tablet 3   ferrous sulfate 325 (65 FE) MG tablet Take 325 mg by mouth daily.     loratadine (CLARITIN) 10 MG tablet Take 10 mg by mouth daily.     Magnesium 250 MG TABS Take 1 tablet by mouth daily.     metFORMIN (GLUCOPHAGE) 500 MG tablet Take 1 tablet (500 mg total) by mouth 2 (two) times daily with a meal. 180 tablet 0   Olopatadine-Mometasone (RYALTRIS) 665-25 MCG/ACT SUSP Place 2 puffs into the nose in the morning and at bedtime. 29 g 0   Semaglutide, 2 MG/DOSE, (OZEMPIC, 2 MG/DOSE,) 8 MG/3ML SOPN INJECT 2 MG AS DIRECTED ONCE A WEEK. 8 mL 0   zolpidem (AMBIEN) 10 MG tablet Take 10 mg by mouth at bedtime.     No current facility-administered medications for this visit.    OBJECTIVE: There were no vitals filed for this visit.   There is no height or weight on file to calculate BMI.      General: Well-developed, well-nourished, no acute distress. Eyes: Pink conjunctiva, anicteric sclera. HEENT: Normocephalic, moist mucous membranes, clear oropharnyx. Lungs: Clear to auscultation bilaterally. Heart: Regular rate and rhythm. No rubs, murmurs, or gallops. Abdomen: Soft, nontender, nondistended. No organomegaly noted, normoactive bowel sounds. Musculoskeletal: No edema, cyanosis, or clubbing. Neuro: Alert, answering all questions appropriately. Cranial nerves grossly intact. Skin: No rashes or petechiae noted. Psych: Normal affect. Lymphatics: No cervical, calvicular, axillary or inguinal LAD.   LAB RESULTS:  Lab Results  Component Value Date   NA 136 03/23/2023   K 4.8 03/23/2023   CL 104 03/23/2023   CO2 25 03/23/2023   GLUCOSE 88 03/23/2023   BUN 11 03/23/2023   CREATININE 0.85 03/23/2023   CALCIUM 9.4 03/23/2023   PROT 7.0 03/23/2023   ALBUMIN 4.0 10/17/2022   AST 14 03/23/2023   ALT 14 03/23/2023   ALKPHOS 68 10/17/2022   BILITOT 0.2 03/23/2023   GFRNONAA >60 10/16/2022   GFRAA >60  03/22/2017    Lab Results  Component Value Date   WBC 9.2 03/23/2023   NEUTROABS 6,431 03/23/2023   HGB 12.1 03/23/2023   HCT 36.7 03/23/2023   MCV 86.8 03/23/2023   PLT 309 03/23/2023    Lab Results  Component Value Date   TIBC 417 03/23/2023   TIBC 398 01/20/2022   TIBC 389 07/03/2020   FERRITIN 17 03/23/2023   FERRITIN 35 01/20/2022   FERRITIN 225 07/03/2020   IRONPCTSAT 8 (L) 03/23/2023   IRONPCTSAT 9 (L) 01/20/2022   IRONPCTSAT 13 (L) 07/03/2020     STUDIES: No results found.  ASSESSMENT AND PLAN:  Donna Haas is a 47 y.o. female with pmh of plantar fasciitis, GERD, endometriosis, anxiety, migraines referred to hematology for iron deficiency anemia.  # Iron deficiency -Chronic.  Could be related to heavy menstrual cycles.  Cannot rule out occult GI bleed.  On oral iron but not responsive. -Recent labs reviewed.  Hemoglobin 12.1, ferritin 17 and saturation 8%. -Discussed about IV Venofer 200 mg weekly x 5 doses.  Side effects such as nausea, back pain, chest pain, allergic reaction was discussed.  Patient agreeable to proceed. -She has chronic diarrhea and has previously seen GI.  Reports having endoscopy and colonoscopy about 10 years ago.  I cannot find the report.  She has been referred to GI [facility] per patient by her primary care doctor.  I will follow-up with her in 4 months with repeat labs.  Orders Placed This Encounter  Procedures   CBC with Differential (Cancer Center Only)   Ferritin   Iron and TIBC([facility])    Patient expressed understanding and was in agreement with this plan. She also understands that She can call clinic at any time with any questions, concerns, or complaints.   I spent a total of 45 minutes reviewing chart data, face-to-face evaluation with the patient, counseling and coordination of care as detailed above.  Michaelyn Barter, MD   04/08/2023 2:00 PM

## 2023-04-08 NOTE — Progress Notes (Signed)
Patient is been having nausea, and headaches, and shakiness, getting dizzy ongoing for a while.

## 2023-04-19 ENCOUNTER — Ambulatory Visit: Payer: 59 | Admitting: Family Medicine

## 2023-04-20 ENCOUNTER — Inpatient Hospital Stay: Payer: 59 | Attending: Internal Medicine

## 2023-04-20 VITALS — BP 131/86 | HR 86 | Temp 96.3°F | Resp 18

## 2023-04-20 DIAGNOSIS — E611 Iron deficiency: Secondary | ICD-10-CM

## 2023-04-20 DIAGNOSIS — D509 Iron deficiency anemia, unspecified: Secondary | ICD-10-CM | POA: Insufficient documentation

## 2023-04-20 MED ORDER — IRON SUCROSE 20 MG/ML IV SOLN
200.0000 mg | Freq: Once | INTRAVENOUS | Status: AC
  Administered 2023-04-20: 200 mg via INTRAVENOUS
  Filled 2023-04-20: qty 10

## 2023-04-20 MED ORDER — SODIUM CHLORIDE 0.9% FLUSH
10.0000 mL | Freq: Once | INTRAVENOUS | Status: AC | PRN
  Administered 2023-04-20: 10 mL
  Filled 2023-04-20: qty 10

## 2023-04-26 ENCOUNTER — Inpatient Hospital Stay: Payer: 59

## 2023-04-26 VITALS — BP 125/93 | HR 78 | Temp 96.3°F | Resp 18

## 2023-04-26 DIAGNOSIS — E611 Iron deficiency: Secondary | ICD-10-CM

## 2023-04-26 DIAGNOSIS — D509 Iron deficiency anemia, unspecified: Secondary | ICD-10-CM | POA: Diagnosis not present

## 2023-04-26 MED ORDER — IRON SUCROSE 20 MG/ML IV SOLN
200.0000 mg | Freq: Once | INTRAVENOUS | Status: AC
  Administered 2023-04-26: 200 mg via INTRAVENOUS
  Filled 2023-04-26: qty 10

## 2023-04-26 MED ORDER — SODIUM CHLORIDE 0.9% FLUSH
10.0000 mL | Freq: Once | INTRAVENOUS | Status: DC | PRN
  Filled 2023-04-26: qty 10

## 2023-04-27 ENCOUNTER — Telehealth: Payer: Self-pay | Admitting: Internal Medicine

## 2023-04-27 NOTE — Telephone Encounter (Signed)
 Called patient to change appointment to 11:30 on 1/28 due to needing room for a chemo patient- Left a detailed voicemail for her and asked he to call back if it doesn't work for her so we can get it changed to a time that will work.

## 2023-04-28 ENCOUNTER — Ambulatory Visit: Payer: 59 | Admitting: Family Medicine

## 2023-05-03 ENCOUNTER — Inpatient Hospital Stay: Payer: 59

## 2023-05-04 ENCOUNTER — Inpatient Hospital Stay: Payer: 59

## 2023-05-05 ENCOUNTER — Inpatient Hospital Stay: Payer: 59

## 2023-05-11 ENCOUNTER — Ambulatory Visit
Admission: RE | Admit: 2023-05-11 | Discharge: 2023-05-11 | Disposition: A | Discharge status: Home / Self Care | Payer: 59 | Source: Ambulatory Visit | Attending: Internal Medicine | Admitting: Internal Medicine

## 2023-05-11 ENCOUNTER — Inpatient Hospital Stay: Payer: 59

## 2023-05-11 ENCOUNTER — Encounter: Payer: Self-pay | Admitting: Internal Medicine

## 2023-05-11 ENCOUNTER — Other Ambulatory Visit: Payer: Self-pay

## 2023-05-11 ENCOUNTER — Ambulatory Visit (INDEPENDENT_AMBULATORY_CARE_PROVIDER_SITE_OTHER): Payer: 59 | Admitting: Internal Medicine

## 2023-05-11 VITALS — BP 122/80 | HR 98 | Temp 97.9°F | Resp 18 | Ht 60.0 in | Wt 170.8 lb

## 2023-05-11 DIAGNOSIS — L03211 Cellulitis of face: Secondary | ICD-10-CM

## 2023-05-11 DIAGNOSIS — R220 Localized swelling, mass and lump, head: Secondary | ICD-10-CM | POA: Diagnosis not present

## 2023-05-11 DIAGNOSIS — J340 Abscess, furuncle and carbuncle of nose: Secondary | ICD-10-CM | POA: Diagnosis present

## 2023-05-11 MED ORDER — DOXYCYCLINE HYCLATE 100 MG PO TABS: 100.0000 mg | ORAL_TABLET | Freq: Two times a day (BID) | ORAL | 0 refills | Status: AC

## 2023-05-11 MED ORDER — IOHEXOL 300 MG/ML  SOLN
75.0000 mL | Freq: Once | INTRAMUSCULAR | Status: AC | PRN
  Administered 2023-05-11: 75 mL via INTRAVENOUS

## 2023-05-11 MED ORDER — AMOXICILLIN-POT CLAVULANATE 875-125 MG PO TABS: 1.0000 | ORAL_TABLET | Freq: Two times a day (BID) | ORAL | 0 refills | Status: AC

## 2023-05-11 NOTE — Addendum Note (Signed)
Addended by: Margarita Mail on: 05/11/2023 01:09 PM   Modules accepted: Orders

## 2023-05-11 NOTE — Progress Notes (Signed)
Acute Office Visit  Subjective:     Patient ID: Donna Haas, female    DOB: [birth year], 48 y.o.   MRN: [id number]  Chief Complaint  Patient presents with   Facial Swelling    Right side of nostril and face swelling, red, painful    HPI Patient is in today for right sided facial swelling that started yesterday morning. Started with an irritating bump in her nose on the right facial side but then spread to severe right facial/sinus pain and pressure with swelling. Pain radiating into teeth and right ear. No fevers but chilled with fatigue. No congestion, rhinorrhea, sore throat or SOB, wheezing. Mild dry cough. Took Ibuprofen last night but nothing else. Same thing happened in 2022, which required hospital admission and multiple antibiotics.    Review of Systems  Constitutional:  Positive for malaise/fatigue. Negative for chills and fever.  HENT:  Positive for ear pain and sinus pain. Negative for congestion and sore throat.   Respiratory:  Positive for cough. Negative for sputum production, shortness of breath and wheezing.   Cardiovascular:  Negative for chest pain.  Neurological:  Positive for headaches.        Objective:    Pulse 98   Temp 97.9 F (36.6 C) (Oral)   Resp 18   Ht 5' (1.524 m)   Wt 170 lb 12.8 oz (77.5 kg)   SpO2 98%   BMI 33.36 kg/m  BP Readings from Last 3 Encounters:  04/26/23 (!) 125/93  04/20/23 131/86  04/08/23 (!) 137/90   Wt Readings from Last 3 Encounters:  05/11/23 170 lb 12.8 oz (77.5 kg)  04/08/23 168 lb (76.2 kg)  03/23/23 172 lb (78 kg)      Physical Exam Constitutional:      Appearance: Normal appearance.  HENT:     Head: Normocephalic and atraumatic.     Comments: Large abscess on the inferior right facial side inside the nose. Very tender with what appears to be an abscess with pain and swelling over right maxillary sinus.     Right Ear: Tympanic membrane, ear canal and external ear normal.     Left Ear: Tympanic membrane,  ear canal and external ear normal.     Nose: Rhinorrhea present.     Mouth/Throat:     Mouth: Mucous membranes are moist.     Comments: Mild erythema Eyes:     Conjunctiva/sclera: Conjunctivae normal.  Cardiovascular:     Rate and Rhythm: Normal rate and regular rhythm.  Pulmonary:     Effort: Pulmonary effort is normal.     Breath sounds: Normal breath sounds.  Musculoskeletal:     Cervical back: Tenderness present.  Lymphadenopathy:     Cervical: No cervical adenopathy.  Skin:    General: Skin is warm and dry.  Neurological:     General: No focal deficit present.     Mental Status: She is alert. Mental status is at baseline.  Psychiatric:        Mood and Affect: Mood normal.        Behavior: Behavior normal.     No results found for any visits on 05/11/23.      Assessment & Plan:   1. Abscess, furuncle and carbuncle of nose (Primary)/Cellulitis of face: Large area abscess with surrounding facial cellulitis. Will order stat CT of the maxillary sinuses, start treatment with Augmentin and Doxycycline. Strict follow up in 2 days to recheck.  - CT MAXILLOFACIAL W CONTRAST; Future -  doxycycline (VIBRA-TABS) 100 MG tablet; Take 1 tablet (100 mg total) by mouth 2 (two) times daily for 7 days.  Dispense: 14 tablet; Refill: 0 - amoxicillin-clavulanate (AUGMENTIN) 875-125 MG tablet; Take 1 tablet by mouth 2 (two) times daily for 7 days.  Dispense: 14 tablet; Refill: 0  Return in about 3 days (around 05/14/2023).  Margarita Mail, DO

## 2023-05-12 ENCOUNTER — Encounter: Payer: Self-pay | Admitting: Internal Medicine

## 2023-05-12 ENCOUNTER — Telehealth: Payer: Self-pay | Admitting: Family Medicine

## 2023-05-12 ENCOUNTER — Ambulatory Visit: Payer: Self-pay

## 2023-05-12 NOTE — Telephone Encounter (Signed)
Message from Donna Haas sent at 05/12/2023  2:31 PM EST  Summary: abscess pain   Patient was seen by Dr. Caralee Ates for abscess and she states Ibuprofen and tylenol are not working at all for her and I'm in so much pain.         Chief Complaint: facial pain Symptoms: severe pain with swelling, otriginal swelling is stable but noted slight swelling abit higher than yesterday Frequency: ongoing  Pertinent Negatives: Patient denies fever, difficulty swallowing Disposition: [] ED /[] Urgent Care (no appt availability in office) / [x] Appointment(In office/virtual)/ []  [facility]/ [] Home Care/ [] Refused Recommended Disposition /[] [facility]/ []  Follow-up with PCP Additional Notes: Appt made for 0800 am. Routing to office for pain med request. (See also pt message). Pt has been alternating 800 mg Ibuprofen with 1000 mg acetaminophen with no relief.  Reason for Disposition  [1] Swollen area of face AND [2] is painful to touch  Answer Assessment - Initial Assessment Questions 1. ONSET: "When did the pain start?" (e.g., minutes, hours, days)     Pt with abscess on face tried OTC doesn't work     3. SEVERITY: "How bad is the pain?"   (Scale 1-10; mild, moderate or severe)   - MILD (1-3): doesn't interfere with normal activities    - MODERATE (4-7): interferes with normal activities or awakens from sleep    - SEVERE (8-10): excruciating pain, unable to do any normal activities      severe 4. LOCATION: "Where does it hurt?"      Nose (inside) 6. FEVER: "Do you have a fever?" If Yes, ask: "What is it, how was it measured, and when did it start?"      no 7. OTHER SYMPTOMS: "Do you have any other symptoms?" (e.g., fever, toothache, nasal discharge, nasal congestion, clicking sensation in jaw joint)     Facial swelling (pt stated swelling has moved a little higher than yesterday but the original swelling is stable  Protocols used: Face Pain-A-AH

## 2023-05-12 NOTE — Telephone Encounter (Signed)
Please advise

## 2023-05-13 ENCOUNTER — Ambulatory Visit: Payer: 59 | Admitting: Family Medicine

## 2023-05-13 ENCOUNTER — Other Ambulatory Visit: Payer: Self-pay | Admitting: Internal Medicine

## 2023-05-13 DIAGNOSIS — J340 Abscess, furuncle and carbuncle of nose: Secondary | ICD-10-CM

## 2023-05-13 MED ORDER — NAPROXEN 500 MG PO TABS: 500.0000 mg | ORAL_TABLET | Freq: Two times a day (BID) | ORAL | 0 refills | Status: AC | PRN

## 2023-05-13 NOTE — Telephone Encounter (Signed)
Left message for patient

## 2023-05-14 ENCOUNTER — Ambulatory Visit: Payer: 59 | Admitting: Internal Medicine

## 2023-05-15 ENCOUNTER — Other Ambulatory Visit: Payer: Self-pay | Admitting: Nurse Practitioner

## 2023-05-17 NOTE — Telephone Encounter (Signed)
Requested medication (s) are due for refill today:   Yes  Requested medication (s) are on the active medication list:   Yes  Future visit scheduled:   Yes 2/5 with Dr Carlynn Purl   Last ordered: 01/11/2023 8 ml, 0 refills  Unable to refill because dispense amount needed by provider.     Requested Prescriptions  Pending Prescriptions Disp Refills   OZEMPIC, 2 MG/DOSE, 8 MG/3ML SOPN [Pharmacy Med Name: OZEMPIC 8 MG/3 ML (2 MG/DOSE)]      Sig: INJECT 2 MG AS DIRECTED ONCE A WEEK.     Endocrinology:  Diabetes - GLP-1 Receptor Agonists - semaglutide Failed - 05/17/2023  9:23 AM      Failed - HBA1C in normal range and within 180 days    Hgb A1c MFr Bld  Date Value Ref Range Status  03/23/2023 5.8 (H) <5.7 % of total Hgb Final    Comment:    For someone without known diabetes, a hemoglobin  A1c value between 5.7% and 6.4% is consistent with prediabetes and should be confirmed with a  follow-up test. . For someone with known diabetes, a value <7% indicates that their diabetes is well controlled. A1c targets should be individualized based on duration of diabetes, age, comorbid conditions, and other considerations. . This assay result is consistent with an increased risk of diabetes. . Currently, no consensus exists regarding use of hemoglobin A1c for diagnosis of diabetes for children. .          Passed - Cr in normal range and within 360 days    Creat  Date Value Ref Range Status  03/23/2023 0.85 0.50 - 0.99 mg/dL Final   Creatinine, Urine  Date Value Ref Range Status  03/23/2023 49 20 - 275 mg/dL Final         Passed - Valid encounter within last 6 months    Recent Outpatient Visits           6 days ago Abscess, furuncle and carbuncle of nose   [facility] Margarita Mail, DO   1 month ago Type 2 diabetes mellitus with hyperglycemia, with long-term current use of insulin [facility])   [facility] Mecum, Erin E, PA-C   12  months ago Dyslipidemia associated with type 2 diabetes mellitus [facility])   [facility] [place], Danna Hefty, MD   1 year ago Type 2 diabetes mellitus with hyperglycemia, with long-term current use of insulin [facility])   [facility] Danelle Berry, PA-C   1 year ago Upper respiratory tract infection due to COVID-19 virus   [facility] Danelle Berry, PA-C       Future Appointments             In 2 days Alba Cory, MD [facility]   In 2 weeks Alba Cory, MD [facility]

## 2023-05-18 ENCOUNTER — Inpatient Hospital Stay: Payer: 59 | Attending: Internal Medicine

## 2023-05-18 VITALS — BP 146/90 | HR 73 | Temp 96.0°F | Resp 18

## 2023-05-18 DIAGNOSIS — D509 Iron deficiency anemia, unspecified: Secondary | ICD-10-CM | POA: Insufficient documentation

## 2023-05-18 DIAGNOSIS — E611 Iron deficiency: Secondary | ICD-10-CM

## 2023-05-18 MED ORDER — IRON SUCROSE 20 MG/ML IV SOLN
200.0000 mg | Freq: Once | INTRAVENOUS | Status: AC
  Administered 2023-05-18: 200 mg via INTRAVENOUS

## 2023-05-18 MED ORDER — SODIUM CHLORIDE 0.9% FLUSH
10.0000 mL | Freq: Once | INTRAVENOUS | Status: DC | PRN
  Filled 2023-05-18: qty 10

## 2023-05-19 ENCOUNTER — Ambulatory Visit: Payer: Self-pay | Admitting: Family Medicine

## 2023-05-19 ENCOUNTER — Other Ambulatory Visit: Payer: Self-pay | Admitting: Physician Assistant

## 2023-05-19 DIAGNOSIS — Z794 Long term (current) use of insulin: Secondary | ICD-10-CM

## 2023-05-21 NOTE — Telephone Encounter (Signed)
 Requested Prescriptions  Pending Prescriptions Disp Refills   metFORMIN  (GLUCOPHAGE ) 500 MG tablet [Pharmacy Med Name: METFORMIN  HCL 500 MG TABLET] 180 tablet 0    Sig: TAKE 1 TABLET BY MOUTH 2 TIMES DAILY WITH A MEAL.     Endocrinology:  Diabetes - Biguanides Failed - 05/21/2023  7:46 AM      Failed - B12 Level in normal range and within 720 days    Vitamin B-12  Date Value Ref Range Status  03/23/2023 1,136 (H) 200 - 1,100 pg/mL Final         Passed - Cr in normal range and within 360 days    Creat  Date Value Ref Range Status  03/23/2023 0.85 0.50 - 0.99 mg/dL Final   Creatinine, Urine  Date Value Ref Range Status  03/23/2023 49 20 - 275 mg/dL Final         Passed - HBA1C is between 0 and 7.9 and within 180 days    Hgb A1c MFr Bld  Date Value Ref Range Status  03/23/2023 5.8 (H) <5.7 % of total Hgb Final    Comment:    For someone without known diabetes, a hemoglobin  A1c value between 5.7% and 6.4% is consistent with prediabetes and should be confirmed with a  follow-up test. . For someone with known diabetes, a value <7% indicates that their diabetes is well controlled. A1c targets should be individualized based on duration of diabetes, age, comorbid conditions, and other considerations. . This assay result is consistent with an increased risk of diabetes. . Currently, no consensus exists regarding use of hemoglobin A1c for diagnosis of diabetes for children. .          Passed - eGFR in normal range and within 360 days    GFR, Est African American  Date Value Ref Range Status  01/21/2016 >89 >=60 mL/min Final   GFR calc Af Amer  Date Value Ref Range Status  03/22/2017 >60 >60 mL/min Final    Comment:    (NOTE) The eGFR has been calculated using the CKD EPI equation. This calculation has not been validated in all clinical situations. eGFR's persistently <60 mL/min signify possible Chronic Kidney Disease.    GFR, Est Non African American  Date Value  Ref Range Status  01/21/2016 >89 >=60 mL/min Final   GFR, Estimated  Date Value Ref Range Status  10/16/2022 >60 >60 mL/min Final    Comment:    (NOTE) Calculated using the CKD-EPI Creatinine Equation (2021)    GFR  Date Value Ref Range Status  09/02/2011 80.38 >60.00 mL/min Final   eGFR  Date Value Ref Range Status  03/23/2023 86 > OR = 60 mL/min/1.59m2 Final         Passed - Valid encounter within last 6 months    Recent Outpatient Visits           1 week ago Abscess, furuncle and carbuncle of nose   [facility] Bernardo Fend, DO   1 month ago Type 2 diabetes mellitus with hyperglycemia, with long-term current use of insulin  [facility])   [facility] Mecum, Erin E, PA-C   1 year ago Dyslipidemia associated with type 2 diabetes mellitus [facility])   [facility] [place], Krichna, MD   1 year ago Type 2 diabetes mellitus with hyperglycemia, with long-term current use of insulin  [facility])   [facility] Leavy Mole, PA-C   1 year ago Upper respiratory  tract infection due to COVID-19 virus   [facility] Leavy Mole, [place]       Future Appointments             In 1 week Sowles, Krichna, MD [facility]            Passed - CBC within normal limits and completed in the last 12 months    WBC  Date Value Ref Range Status  03/23/2023 9.2 3.8 - 10.8 Thousand/uL Final   RBC  Date Value Ref Range Status  03/23/2023 4.23 3.80 - 5.10 Million/uL Final   Hemoglobin  Date Value Ref Range Status  03/23/2023 12.1 11.7 - 15.5 g/dL Final   HCT  Date Value Ref Range Status  03/23/2023 36.7 35.0 - 45.0 % Final   MCHC  Date Value Ref Range Status  03/23/2023 33.0 32.0 - 36.0 g/dL Final    Comment:    For adults, a slight decrease in the calculated MCHC value (in the range of 30 to 32 g/dL) is most likely not clinically  significant; however, it should be interpreted with caution in correlation with other red cell parameters and the patient's clinical condition.    [facility]  Date Value Ref Range Status  03/23/2023 28.6 27.0 - 33.0 pg Final   MCV  Date Value Ref Range Status  03/23/2023 86.8 80.0 - 100.0 fL Final   No results found for: PLTCOUNTKUC, LABPLAT, POCPLA RDW  Date Value Ref Range Status  03/23/2023 13.0 11.0 - 15.0 % Final

## 2023-05-24 ENCOUNTER — Other Ambulatory Visit: Payer: Self-pay | Admitting: Family Medicine

## 2023-05-24 MED ORDER — OZEMPIC (2 MG/DOSE) 8 MG/3ML ~~LOC~~ SOPN: 1.0000 | PEN_INJECTOR | SUBCUTANEOUS | 0 refills | Status: DC

## 2023-05-25 ENCOUNTER — Inpatient Hospital Stay: Payer: 59

## 2023-06-01 ENCOUNTER — Ambulatory Visit: Payer: 59 | Admitting: Family Medicine

## 2023-06-01 ENCOUNTER — Inpatient Hospital Stay: Payer: 59

## 2023-06-01 VITALS — BP 123/93 | HR 89 | Temp 97.2°F | Resp 16

## 2023-06-01 DIAGNOSIS — D509 Iron deficiency anemia, unspecified: Secondary | ICD-10-CM | POA: Diagnosis not present

## 2023-06-01 DIAGNOSIS — E611 Iron deficiency: Secondary | ICD-10-CM

## 2023-06-01 MED ORDER — SODIUM CHLORIDE 0.9% FLUSH
10.0000 mL | Freq: Once | INTRAVENOUS | Status: AC | PRN
  Administered 2023-06-01: 10 mL
  Filled 2023-06-01: qty 10

## 2023-06-01 MED ORDER — IRON SUCROSE 20 MG/ML IV SOLN
200.0000 mg | Freq: Once | INTRAVENOUS | Status: AC
  Administered 2023-06-01: 200 mg via INTRAVENOUS
  Filled 2023-06-01: qty 10

## 2023-06-01 NOTE — Patient Instructions (Signed)

## 2023-06-08 ENCOUNTER — Inpatient Hospital Stay: Payer: 59

## 2023-06-14 ENCOUNTER — Other Ambulatory Visit: Payer: Self-pay | Admitting: Family Medicine

## 2023-06-15 ENCOUNTER — Other Ambulatory Visit: Payer: Self-pay | Admitting: Family Medicine

## 2023-06-15 ENCOUNTER — Inpatient Hospital Stay: Payer: 59 | Attending: Internal Medicine

## 2023-06-15 VITALS — BP 135/90 | HR 87 | Temp 96.5°F | Resp 18

## 2023-06-15 DIAGNOSIS — D509 Iron deficiency anemia, unspecified: Secondary | ICD-10-CM | POA: Insufficient documentation

## 2023-06-15 DIAGNOSIS — E611 Iron deficiency: Secondary | ICD-10-CM

## 2023-06-15 MED ORDER — SODIUM CHLORIDE 0.9% FLUSH
10.0000 mL | Freq: Once | INTRAVENOUS | Status: AC | PRN
  Administered 2023-06-15: 10 mL
  Filled 2023-06-15: qty 10

## 2023-06-15 MED ORDER — IRON SUCROSE 20 MG/ML IV SOLN
200.0000 mg | Freq: Once | INTRAVENOUS | Status: AC
  Administered 2023-06-15: 200 mg via INTRAVENOUS
  Filled 2023-06-15: qty 10

## 2023-06-17 ENCOUNTER — Telehealth: Payer: Self-pay

## 2023-06-17 NOTE — Telephone Encounter (Signed)
 Copied from CRM [id number]. Topic: Clinical - Prescription Issue >> Jun 17, 2023 12:34 PM Antwanette L wrote: Reason for CRM: Patient sent a message a MyChart for a prescription refill on Semaglutide, 2 MG/DOSE, (OZEMPIC, 2 MG/DOSE,) 8 MG/3ML SOPN but the refill was denied. Patient just saw Dr.Sowles on 2/18. Patient can be contacted through MyChart and phone at [phone].

## 2023-06-17 NOTE — Telephone Encounter (Signed)
 Pt is scheduled for next week with Leisa due to Dr Carlynn Purl being booked until April

## 2023-06-18 ENCOUNTER — Other Ambulatory Visit: Payer: Self-pay | Admitting: Family Medicine

## 2023-06-18 MED ORDER — OZEMPIC (2 MG/DOSE) 8 MG/3ML ~~LOC~~ SOPN: 1.0000 | PEN_INJECTOR | SUBCUTANEOUS | 0 refills | Status: DC

## 2023-06-18 NOTE — Telephone Encounter (Signed)
 Pt has made appt w/ Dr Carlynn Purl on April 1st at 9:40 am. Pt would like her Ozempic called into the pharmacy to last through until her appt 07/13/2023

## 2023-06-18 NOTE — Telephone Encounter (Signed)
 Lvm for pt to call back if she would like to reschedule with dr Carlynn Purl

## 2023-06-23 ENCOUNTER — Ambulatory Visit: Admitting: Family Medicine

## 2023-07-13 ENCOUNTER — Ambulatory Visit: Admitting: Family Medicine

## 2023-07-14 ENCOUNTER — Other Ambulatory Visit: Payer: Self-pay | Admitting: Family Medicine

## 2023-07-19 ENCOUNTER — Ambulatory Visit: Admitting: Family Medicine

## 2023-07-19 ENCOUNTER — Encounter: Payer: Self-pay | Admitting: Family Medicine

## 2023-07-19 VITALS — BP 120/78 | HR 101 | Resp 16 | Ht 60.0 in | Wt 169.0 lb

## 2023-07-19 DIAGNOSIS — E1169 Type 2 diabetes mellitus with other specified complication: Secondary | ICD-10-CM

## 2023-07-19 DIAGNOSIS — E785 Hyperlipidemia, unspecified: Secondary | ICD-10-CM | POA: Diagnosis not present

## 2023-07-19 DIAGNOSIS — G43109 Migraine with aura, not intractable, without status migrainosus: Secondary | ICD-10-CM

## 2023-07-19 DIAGNOSIS — E66811 Obesity, class 1: Secondary | ICD-10-CM | POA: Diagnosis not present

## 2023-07-19 DIAGNOSIS — J4520 Mild intermittent asthma, uncomplicated: Secondary | ICD-10-CM

## 2023-07-19 DIAGNOSIS — M329 Systemic lupus erythematosus, unspecified: Secondary | ICD-10-CM

## 2023-07-19 DIAGNOSIS — E538 Deficiency of other specified B group vitamins: Secondary | ICD-10-CM | POA: Diagnosis not present

## 2023-07-19 DIAGNOSIS — G720 Drug-induced myopathy: Secondary | ICD-10-CM

## 2023-07-19 DIAGNOSIS — E611 Iron deficiency: Secondary | ICD-10-CM | POA: Diagnosis not present

## 2023-07-19 DIAGNOSIS — T466X5A Adverse effect of antihyperlipidemic and antiarteriosclerotic drugs, initial encounter: Secondary | ICD-10-CM | POA: Diagnosis not present

## 2023-07-19 LAB — POCT GLYCOSYLATED HEMOGLOBIN (HGB A1C): Hemoglobin A1C: 5.6 % (ref 4.0–5.6)

## 2023-07-19 MED ORDER — OZEMPIC (2 MG/DOSE) 8 MG/3ML ~~LOC~~ SOPN: 1.0000 | PEN_INJECTOR | SUBCUTANEOUS | 1 refills | Status: DC

## 2023-07-19 MED ORDER — RIZATRIPTAN BENZOATE 10 MG PO TABS: 10.0000 mg | ORAL_TABLET | ORAL | 1 refills | Status: AC | PRN

## 2023-07-19 MED ORDER — METFORMIN HCL 500 MG PO TABS: 500.0000 mg | ORAL_TABLET | Freq: Two times a day (BID) | ORAL | 1 refills | Status: DC

## 2023-07-19 MED ORDER — ALBUTEROL SULFATE HFA 108 (90 BASE) MCG/ACT IN AERS: 2.0000 | INHALATION_SPRAY | Freq: Four times a day (QID) | RESPIRATORY_TRACT | 1 refills | Status: AC | PRN

## 2023-07-19 MED ORDER — NEXLIZET 180-10 MG PO TABS: 1.0000 | ORAL_TABLET | Freq: Every day | ORAL | 1 refills | Status: DC

## 2023-07-19 NOTE — Progress Notes (Signed)
 Name: Donna Haas   MRN: [id number]    DOB: 04-29-1975   Date:07/19/2023       Progress Note  Subjective  Chief Complaint  Chief Complaint  Patient presents with   Medical Management of Chronic Issues   Discussed the use of AI scribe software for clinical note transcription with the patient, who gave verbal consent to proceed.  History of Present Illness Donna Haas is a 48 year old female with diabetes who presents for a regular diabetes follow-up.  Her A1c is currently 5.6%, showing improvement from December. She continues metformin 500 mg twice daily and Ozempic 2 mg. No hypoglycemia symptoms like shakiness. She follows a high-protein diet, drinks plenty of water, and engages in modified yoga for 15 minutes, five days a week. Her BMI has decreased to 33, indicating improvement in obesity status. She denies side effects of medication. No polyuria , polyphagia or polydipsia. She has associated obesity and dyslipidemia. Last LDL not at goal , takes Zetia   She has a history of systemic and discoid lupus, experiencing chronic pain and fatigue. She has not seen a rheumatologist recently but was previously under Dr. Jacky Kindle care at [facility]. COVID-19 has impacted her ability to see a rheumatologist. She is not on immunosuppressive therapy currently but was previously on Plaquenil, which affected her vision. She experiences joint aches, swelling, and stiffness.  She has iron deficiency anemia and recently completed iron infusions. Her ferritin level increased from 3 to 17. She attributes her fatigue to iron deficiency rather than chronic fatigue syndrome.  She has a history of asthma, which is stable, but experiences shortness of breath since COVID-19. She uses albuterol as needed.  She experiences migraines about four times a month, often related to her menstrual cycle. Maxalt effectively manages her symptoms.  She takes tizanidine for spinal stenosis and bulging discs, causing pain  radiating down both legs.  She has a history of statin myopathy and takes Zetia for dyslipidemia.   Patient Active Problem List   Diagnosis Date Noted   Iron deficiency 04/08/2023   Type 2 diabetes mellitus with hyperglycemia, with long-term current use of insulin (HCC) 03/23/2023   Irritable bowel syndrome with diarrhea 05/20/2022   Statin myopathy 05/20/2022   Seasonal allergic rhinitis 01/20/2022   Migraine without aura and responsive to treatment 11/20/2020   Cervical nerve root disorder 11/20/2020   B12 deficiency 11/20/2020   Dyslipidemia associated with type 2 diabetes mellitus (HCC) 06/14/2020   Anxiety, generalized 12/13/2019   Binge eating disorder 08/23/2019   Mild intermittent asthma without complication 08/23/2019   Morbid obesity (HCC) 01/25/2016   Barrett's esophagus determined by biopsy 01/21/2016   Chronic fatigue 01/21/2016   Carpal tunnel syndrome, bilateral 01/21/2016   Hx gestational diabetes 01/21/2016   Neck pain 09/08/2012   INSOMNIA 09/21/2008   NEPHROLITHIASIS, HX OF 04/17/2008   THYROID NODULE 01/12/2008   GERD 05/30/2007   ENDOMETRIOSIS OF OTHER SPECIFIED SITES 05/30/2007   Systemic lupus erythematosus (HCC) 05/30/2007   HERNIATED LUMBAR DISC 05/30/2007   Backache 05/30/2007    Past Surgical History:  Procedure Laterality Date   APPENDECTOMY     CESAREAN SECTION     x2   CESAREAN SECTION WITH BILATERAL TUBAL LIGATION Bilateral 08/30/2015   Procedure: CESAREAN SECTION WITH BILATERAL TUBAL LIGATION;  Surgeon: Elenora Fender Ward, MD;  Location: [facility] ORS;  Service: Obstetrics;  Laterality: Bilateral;   CHOLECYSTECTOMY     LAPAROSCOPY     x3   THYROID CYST  EXCISION     Dr. Rolly Salter, [place]    Family History  Problem Relation Age of Onset   Depression Mother        Bi-polar   Asthma Mother    Allergies Mother    Sleep apnea Mother    Allergies Sister    Diabetes Sister    Rheum arthritis Maternal Grandmother    Hypertension Paternal  Grandmother    Macular degeneration Paternal Grandmother    Hyperlipidemia Other    Hypertension Other    Heart disease Other     Social History   Tobacco Use   Smoking status: Never   Smokeless tobacco: Never  Substance Use Topics   Alcohol use: No    Alcohol/week: 1.0 - 2.0 standard drink of alcohol    Types: 1 - 2 Standard drinks or equivalent per week    Comment: discontinued use during pregnancy     Current Outpatient Medications:    ACCU-CHEK GUIDE test strip, , Disp: , Rfl:    Accu-Chek Softclix Lancets lancets, , Disp: , Rfl:    albuterol (VENTOLIN HFA) 108 (90 Base) MCG/ACT inhaler, Inhale 2 puffs into the lungs every 6 (six) hours as needed for wheezing or shortness of breath., Disp: 8 g, Rfl: 1   ALPRAZolam (XANAX) 1 MG tablet, Take 1 mg by mouth daily as needed., Disp: , Rfl:    Bempedoic Acid-Ezetimibe (NEXLIZET) 180-10 MG TABS, Take 1 tablet by mouth daily., Disp: 90 tablet, Rfl: 1   blood glucose meter kit and supplies, Dispense based on patient and insurance preference. Use up to four times daily as directed. (FOR ICD-10 E10.9, E11.9)., Disp: 1 each, Rfl: 0   Cyanocobalamin (B-12) 1000 MCG SUBL, Place under the tongue., Disp: , Rfl:    dextroamphetamine (DEXTROSTAT) 10 MG tablet, Take 20 mg by mouth 2 (two) times daily., Disp: , Rfl:    escitalopram (LEXAPRO) 20 MG tablet, Take 1.5 tablets by mouth daily., Disp: , Rfl:    esomeprazole (NEXIUM) 20 MG capsule, Take 20 mg by mouth daily. , Disp: , Rfl:    ferrous sulfate 325 (65 FE) MG tablet, Take 325 mg by mouth daily., Disp: , Rfl:    loratadine (CLARITIN) 10 MG tablet, Take 10 mg by mouth daily., Disp: , Rfl:    Magnesium 250 MG TABS, Take 1 tablet by mouth daily., Disp: , Rfl:    Olopatadine-Mometasone (RYALTRIS) 665-25 MCG/ACT SUSP, Place 2 puffs into the nose in the morning and at bedtime., Disp: 29 g, Rfl: 0   rizatriptan (MAXALT) 10 MG tablet, Take 1 tablet (10 mg total) by mouth as needed for migraine. May  repeat in 2 hours if needed, Disp: 10 tablet, Rfl: 1   tiZANidine (ZANAFLEX) 4 MG tablet, Take 4-12 mg by mouth at bedtime., Disp: , Rfl:    metFORMIN (GLUCOPHAGE) 500 MG tablet, Take 1 tablet (500 mg total) by mouth 2 (two) times daily with a meal., Disp: 180 tablet, Rfl: 1   Semaglutide, 2 MG/DOSE, (OZEMPIC, 2 MG/DOSE,) 8 MG/3ML SOPN, Inject 1 each into the skin once a week., Disp: 6 mL, Rfl: 1  Allergies  Allergen Reactions   Compazine [Prochlorperazine] Anaphylaxis   Prednisone Anaphylaxis   Prochlorperazine Edisylate Anaphylaxis   Etodolac     Other reaction(s): Unknown   Farxiga [Dapagliflozin]     Constipation and headaches   Hydrocodone-Acetaminophen Nausea Only   Morphine And Codeine Nausea Only and Other (See Comments)    Mood swings, headache  I personally reviewed active problem list, medication list, allergies, family history with the patient/caregiver today.   ROS  Ten systems reviewed and is negative except as mentioned in HPI    Objective Physical Exam VITALS: BP- 120/78 MEASUREMENTS: BMI- 33.0. CONSTITUTIONAL: Patient appears well-developed and well-nourished. No distress. HEENT: Head atraumatic, normocephalic, neck supple. CARDIOVASCULAR: Normal rate, regular rhythm and normal heart sounds. No murmur heard. No BLE edema. No abnormalities on foot examination. PULMONARY: Effort normal and breath sounds normal. No respiratory distress. Lungs clear to auscultation bilaterally. ABDOMINAL: There is no tenderness or distention. MUSCULOSKELETAL: Normal gait. Without gross motor or sensory deficit. PSYCHIATRIC: Patient has a normal mood and affect. Behavior is normal. Judgment and thought content normal.  Vitals:   07/19/23 1301  BP: 120/78  Pulse: (!) 101  Resp: 16  SpO2: 97%  Weight: 169 lb (76.7 kg)  Height: 5' (1.524 m)    Body mass index is 33.01 kg/m.  Recent Results (from the past 2160 hours)  POCT glycosylated hemoglobin (Hb A1C)     Status: None    Collection Time: 07/19/23  1:06 PM  Result Value Ref Range   Hemoglobin A1C 5.6 4.0 - 5.6 %   HbA1c POC (<> result, manual entry)     HbA1c, POC (prediabetic range)     HbA1c, POC (controlled diabetic range)      Diabetic Foot Exam:  Diabetic foot exam was performed with the following findings:   No deformities, ulcerations, or other skin breakdown Normal sensation of 10g monofilament Intact posterior tibialis and dorsalis pedis pulses      PHQ2/9:    07/19/2023   12:59 PM 04/08/2023    2:07 PM 03/23/2023    2:00 PM 05/20/2022    1:47 PM 01/20/2022    9:21 AM  Depression screen PHQ 2/9  Decreased Interest 0 1 0 0 0  Down, Depressed, Hopeless 0 1 0 0 0  PHQ - 2 Score 0 2 0 0 0  Altered sleeping 0  0 0 0  Tired, decreased energy 0  3 0 0  Change in appetite 0  0 0 0  Feeling bad or failure about yourself  0  0 0 0  Trouble concentrating 0  0 0 0  Moving slowly or fidgety/restless 0  0 0 0  Suicidal thoughts 0  0 0 0  PHQ-9 Score 0  3 0 0  Difficult doing work/chores Not difficult at all    Not difficult at all    phq 9 is negative  Fall Risk:    03/23/2023    2:00 PM 05/20/2022    1:47 PM 01/20/2022    9:21 AM 10/22/2021    1:03 PM 07/09/2021    8:07 AM  Fall Risk   Falls in the past year? 0 0 1 1 0  Number falls in past yr: 0  0 0 0  Injury with Fall? 0  0 0 0  Risk for fall due to : No Fall Risks No Fall Risks Impaired balance/gait Impaired balance/gait No Fall Risks  Follow up Falls prevention discussed Falls prevention discussed Falls prevention discussed;Education provided Falls prevention discussed;Education provided Falls prevention discussed     Assessment & Plan Systemic Lupus Erythematosus and Discoid Lupus Systemic and discoid lupus present without current immunosuppressive therapy. Joint aches and stiffness noted. Plaquenil discontinued due to vision issues. - Refer to rheumatologist Dr. Allena Katz for evaluation and management. - Consider re-starting  immunomodulator .  Type 2 Diabetes Mellitus Well-controlled  with A1c of 5.6%. On metformin and Ozempic without hypoglycemic episodes. - Continue metformin 500 mg twice daily. - Continue Ozempic 2 mg. - Monitor for symptoms of hypoglycemia and adjust medications if necessary.  Dyslipidemia Managed with Zetia. Statin intolerance noted. Switch to Nexlizet recommended for better control. - Switch from Zetia to Nexlizet. - Provide voucher for Nexlizet. - Complete prior authorization for Nexlizet.  Obesity BMI 33, improved from morbid obesity. Active weight management through diet and exercise. - Continue current diet and exercise regimen. - Monitor weight and BMI.  Iron Deficiency Anemia Completed iron infusions. Ferritin improved to 17. Not currently anemic. - Continue follow-up with hematologist. - Continue oral iron supplementation.  Asthma Well-controlled. Shortness of breath in spring managed with albuterol due to prednisone allergy. - Prescribe albuterol inhaler for use as needed. - Educate on proper inhaler use and storage.  Migraine Migraines occur four times a month, often menstrual-related. Maxalt effective without adverse effects. - Prescribe Maxalt for migraine management.  Spinal Stenosis with Radiculopathy Spinal stenosis with radiculopathy managed with neurosurgical care. Tizanidine used for muscle relaxation. - Continue tizanidine for muscle relaxation and pain management.  General Health Maintenance Up to date with tetanus and flu vaccinations. Mammogram scheduled. Pap smear records need updating. - Request copy of last Pap smear from [facility]. - Ensure mammogram results are updated in the system.  Follow-up Current management plan to continue with adjustments. - Schedule follow-up appointment in six months.

## 2023-08-06 ENCOUNTER — Inpatient Hospital Stay: Payer: 59 | Admitting: Internal Medicine

## 2023-08-06 ENCOUNTER — Ambulatory Visit: Payer: 59 | Admitting: Internal Medicine

## 2023-08-06 ENCOUNTER — Inpatient Hospital Stay: Payer: 59

## 2023-08-06 ENCOUNTER — Other Ambulatory Visit: Payer: 59

## 2023-08-16 ENCOUNTER — Inpatient Hospital Stay

## 2023-08-16 ENCOUNTER — Inpatient Hospital Stay: Admitting: Internal Medicine

## 2023-08-23 ENCOUNTER — Inpatient Hospital Stay: Admitting: Oncology

## 2023-08-23 ENCOUNTER — Inpatient Hospital Stay

## 2023-09-24 ENCOUNTER — Other Ambulatory Visit: Payer: Self-pay | Admitting: *Deleted

## 2023-09-24 DIAGNOSIS — E611 Iron deficiency: Secondary | ICD-10-CM

## 2023-09-24 DIAGNOSIS — D649 Anemia, unspecified: Secondary | ICD-10-CM

## 2023-09-27 ENCOUNTER — Inpatient Hospital Stay: Admitting: Oncology

## 2023-09-27 ENCOUNTER — Inpatient Hospital Stay

## 2023-10-11 ENCOUNTER — Inpatient Hospital Stay: Admitting: Oncology

## 2023-10-11 ENCOUNTER — Inpatient Hospital Stay

## 2023-10-22 ENCOUNTER — Inpatient Hospital Stay

## 2023-10-22 ENCOUNTER — Inpatient Hospital Stay: Admitting: Oncology

## 2023-10-22 ENCOUNTER — Encounter: Payer: Self-pay | Admitting: Oncology

## 2023-11-02 ENCOUNTER — Telehealth: Payer: Self-pay | Admitting: Internal Medicine

## 2023-11-02 NOTE — Telephone Encounter (Signed)
 Pt received her dismissal letter from Dr.Finnegan's clinic and called to know why. I told pt that she was dismissed due to the amount of no show/reschedules.   Pt stated that she only cancels appts when she is sick or her kids are sick.  She stated she is disabled, she is a single mother to two kids, one of whom is also disabled.  Pt was previously Dr.Agrawal's pt.  Pt asked if any other MD would see her. I told her I would send a message and they would have to let us  know yes or no.   Please advise.  Thank you.

## 2023-11-03 ENCOUNTER — Telehealth: Payer: Self-pay

## 2023-11-03 NOTE — Telephone Encounter (Signed)
 Pt declined an appt and will call us  if she gets sick

## 2023-11-03 NOTE — Telephone Encounter (Signed)
 Patient needs an appt.

## 2023-11-03 NOTE — Telephone Encounter (Signed)
 Copied from CRM [id number]. Topic: Clinical - Medical Advice >> Nov 03, 2023 10:26 AM Delon DASEN wrote: Reason for CRM: Patient was exposed to the flu over the weekend and is asking for a preventative if possible. Please call with any questions [phone]

## 2023-11-07 ENCOUNTER — Other Ambulatory Visit: Payer: Self-pay | Admitting: Family Medicine

## 2023-11-07 DIAGNOSIS — E1169 Type 2 diabetes mellitus with other specified complication: Secondary | ICD-10-CM

## 2023-11-09 NOTE — Telephone Encounter (Signed)
 Pt has an appt on 01/18/24 per dr glenard last appt for her 6 mth fu appt

## 2023-11-12 ENCOUNTER — Telehealth: Payer: Self-pay | Admitting: Nurse Practitioner

## 2023-11-12 NOTE — Telephone Encounter (Signed)
 Patient was dismissed from Dr. Jerone clinic. Donna Haas has agreed to give the patient a chance to be evaluated again. Patient can be scheduled for labs and MD appt within the next month.

## 2023-11-23 ENCOUNTER — Inpatient Hospital Stay: Attending: Nurse Practitioner

## 2023-11-23 ENCOUNTER — Inpatient Hospital Stay (HOSPITAL_BASED_OUTPATIENT_CLINIC_OR_DEPARTMENT_OTHER): Admitting: Nurse Practitioner

## 2023-11-23 VITALS — BP 143/86 | Temp 97.1°F | Resp 16 | Wt 162.5 lb

## 2023-11-23 DIAGNOSIS — E611 Iron deficiency: Secondary | ICD-10-CM

## 2023-11-23 DIAGNOSIS — D649 Anemia, unspecified: Secondary | ICD-10-CM

## 2023-11-23 DIAGNOSIS — D509 Iron deficiency anemia, unspecified: Secondary | ICD-10-CM | POA: Insufficient documentation

## 2023-11-23 DIAGNOSIS — N920 Excessive and frequent menstruation with regular cycle: Secondary | ICD-10-CM | POA: Insufficient documentation

## 2023-11-23 LAB — CBC WITH DIFFERENTIAL/PLATELET
Abs Immature Granulocytes: 0.01 10*3/uL (ref 0.00–0.07)
Basophils Absolute: 0 10*3/uL (ref 0.0–0.1)
Basophils Relative: 0 %
Eosinophils Absolute: 0.1 10*3/uL (ref 0.0–0.5)
Eosinophils Relative: 1 %
HCT: 38 % (ref 36.0–46.0)
Hemoglobin: 12.9 g/dL (ref 12.0–15.0)
Immature Granulocytes: 0 %
Lymphocytes Relative: 18 %
Lymphs Abs: 1.3 10*3/uL (ref 0.7–4.0)
MCH: 30.7 pg (ref 26.0–34.0)
MCHC: 33.9 g/dL (ref 30.0–36.0)
MCV: 90.5 fL (ref 80.0–100.0)
Monocytes Absolute: 0.4 10*3/uL (ref 0.1–1.0)
Monocytes Relative: 6 %
Neutro Abs: 5.4 10*3/uL (ref 1.7–7.7)
Neutrophils Relative %: 75 %
Platelets: 285 10*3/uL (ref 150–400)
RBC: 4.2 MIL/uL (ref 3.87–5.11)
RDW: 11.6 % (ref 11.5–15.5)
WBC: 7.2 10*3/uL (ref 4.0–10.5)
nRBC: 0 % (ref 0.0–0.2)

## 2023-11-23 LAB — IRON AND TIBC
Iron: 63 ug/dL (ref 28–170)
Saturation Ratios: 16 % (ref 10.4–31.8)
TIBC: 400 ug/dL (ref 250–450)
UIBC: 337 ug/dL

## 2023-11-23 LAB — FERRITIN: Ferritin: 72 ng/mL (ref 11–307)

## 2023-11-23 NOTE — Progress Notes (Signed)
 [facility]  [phone] [phone]  ID: Connell KATHEE Collet OB: 29-Apr-1975  MR#: [id number]  RDW#:[id number]  Patient Care Team: Sowles, Krichna, MD as PCP - General (Family Medicine) [facility]  REFERRING PROVIDER: [place]  REASON FOR REFERRAL: Iron  deficiency anemia  HPI: Donna Haas is a 48 y.o. female with past medical history of plantar fasciitis, GERD, endometriosis, anxiety, migraines referred to hematology for iron  deficiency anemia.  Patient seen by PCP for regular testing.  She complained of fatigue.  Labs reviewed.  WBC 9.2, hemoglobin 12.1, platelets 309.  CMP largely unremarkable.  Ferritin 17, saturation 8% with iron  of 33.  Vitamin B12 1136.  Upper endoscopy from 2016 showed nonerosive gastritis.  Otherwise normal. Long standing. Anemia,  Menstrual cycle- 3 super heavy days. Tampons change every 30 mins to hour. Irregular. Years. Seen Obgyn endometriosis.  Ibuprofen  - as needed. Back injury. Everyday.  Diarrhea = chronic. Several years. Seen GI in the past.  Colonoscopy/ endoscopy when 21. About 10 years ago. No report available.  Fatigue - couple of years. Lately worse.   INTERVAL HISTORY: CHANNING SAVICH is a 48 y.o. female with above history of iron  deficiency anemia who returns to clinic for routine follow-up.  She was last seen by Dr. Clista December 2024.  She last received IV iron  in March 2025. Increasing fatigue.   REVIEW OF SYSTEMS:   Review of Systems  Constitutional:  Positive for malaise/fatigue. Negative for chills, fever and weight loss.  HENT:  Negative for hearing loss, nosebleeds, sore throat and tinnitus.   Eyes:  Negative for blurred vision and double vision.  Respiratory:  Negative for cough, hemoptysis, shortness of breath and wheezing.   Cardiovascular:  Negative for chest pain, palpitations and leg swelling.  Gastrointestinal:  Negative for abdominal pain, blood in stool, constipation, diarrhea,  melena, nausea and vomiting.  Genitourinary:  Negative for dysuria, hematuria and urgency.  Musculoskeletal:  Negative for back pain, falls, joint pain and myalgias.  Skin:  Negative for itching and rash.  Neurological:  Positive for dizziness. Negative for tingling, sensory change, loss of consciousness, weakness and headaches.  Endo/Heme/Allergies:  Negative for environmental allergies. Does not bruise/bleed easily.  Psychiatric/Behavioral:  Negative for depression. The patient is not nervous/anxious and does not have insomnia.   As per HPI. Otherwise, a complete review of systems is negative.  PAST MEDICAL HISTORY: Past Medical History:  Diagnosis Date   Anxiety    Back pain    Endometriosis    GERD (gastroesophageal reflux disease)    Gestational diabetes    Lumbar herniated disc    Lupus    Migraines    Morbid obesity (HCC) 01/25/2016   Nephrolithiasis    Obesity    Pancreatitis    Plantar fasciitis    Thyroid  cyst     PAST SURGICAL HISTORY: Past Surgical History:  Procedure Laterality Date   APPENDECTOMY     CESAREAN SECTION     x2   CESAREAN SECTION WITH BILATERAL TUBAL LIGATION Bilateral 08/30/2015   Procedure: CESAREAN SECTION WITH BILATERAL TUBAL LIGATION;  Surgeon: Mitzie BROCKS Ward, MD;  Location: [facility] ORS;  Service: Obstetrics;  Laterality: Bilateral;   CHOLECYSTECTOMY     LAPAROSCOPY     x3   THYROID  CYST EXCISION     Dr. Gearldine, [place]    FAMILY HISTORY: Family History  Problem Relation Age of Onset   Depression Mother        Bi-polar  Asthma Mother    Allergies Mother    Sleep apnea Mother    Allergies Sister    Diabetes Sister    Rheum arthritis Maternal Grandmother    Hypertension Paternal Grandmother    Macular degeneration Paternal Grandmother    Hyperlipidemia Other    Hypertension Other    Heart disease Other     HEALTH MAINTENANCE: Social History   Tobacco Use   Smoking status: Never   Smokeless tobacco: Never  Vaping Use    Vaping status: Never Used  Substance Use Topics   Alcohol use: No    Alcohol/week: 1.0 - 2.0 standard drink of alcohol    Types: 1 - 2 Standard drinks or equivalent per week    Comment: discontinued use during pregnancy   Drug use: No     Allergies  Allergen Reactions   Compazine [Prochlorperazine] Anaphylaxis   Prednisone Anaphylaxis   Prochlorperazine Edisylate Anaphylaxis   Etodolac     Other reaction(s): Unknown   Farxiga [Dapagliflozin]     Constipation and headaches   Hydrocodone -Acetaminophen  Nausea Only   Morphine  And Codeine Nausea Only and Other (See Comments)    Mood swings, headache    Current Outpatient Medications  Medication Sig Dispense Refill   ACCU-CHEK GUIDE test strip      Accu-Chek Softclix Lancets lancets      albuterol  (VENTOLIN  HFA) 108 (90 Base) MCG/ACT inhaler Inhale 2 puffs into the lungs every 6 (six) hours as needed for wheezing or shortness of breath. 8 g 1   ALPRAZolam (XANAX) 1 MG tablet Take 1 mg by mouth daily as needed.     blood glucose meter kit and supplies Dispense based on patient and insurance preference. Use up to four times daily as directed. (FOR ICD-10 E10.9, E11.9). 1 each 0   Cyanocobalamin  (B-12) 1000 MCG SUBL Place under the tongue.     dextroamphetamine  (DEXTROSTAT ) 10 MG tablet Take 20 mg by mouth 2 (two) times daily.     escitalopram  (LEXAPRO ) 20 MG tablet Take 1.5 tablets by mouth daily.     esomeprazole (NEXIUM) 20 MG capsule Take 20 mg by mouth daily.      ferrous sulfate  325 (65 FE) MG tablet Take 325 mg by mouth daily.     loratadine  (CLARITIN ) 10 MG tablet Take 10 mg by mouth daily.     Magnesium 250 MG TABS Take 1 tablet by mouth daily.     metFORMIN  (GLUCOPHAGE ) 500 MG tablet Take 1 tablet (500 mg total) by mouth 2 (two) times daily with a meal. 180 tablet 1   rizatriptan  (MAXALT ) 10 MG tablet Take 1 tablet (10 mg total) by mouth as needed for migraine. May repeat in 2 hours if needed 10 tablet 1   Semaglutide , 2  MG/DOSE, (OZEMPIC , 2 MG/DOSE,) 8 MG/3ML SOPN INJECT 1 EACH INTO THE SKIN ONCE A WEEK. 9 mL 0   tiZANidine (ZANAFLEX) 4 MG tablet Take 4-12 mg by mouth at bedtime.     Bempedoic Acid-Ezetimibe  (NEXLIZET ) 180-10 MG TABS Take 1 tablet by mouth daily. (Patient not taking: Reported on 11/23/2023) 90 tablet 1   Olopatadine-Mometasone (RYALTRIS ) 665-25 MCG/ACT SUSP Place 2 puffs into the nose in the morning and at bedtime. (Patient not taking: Reported on 11/23/2023) 29 g 0   No current facility-administered medications for this visit.    OBJECTIVE: Vitals:   11/23/23 1416  BP: (!) 143/86  Resp: 16  Temp: (!) 97.1 F (36.2 C)     Body mass  index is 31.74 kg/m.      General: Well-developed, well-nourished, no acute distress. Eyes: Pink conjunctiva, anicteric sclera. Lungs: No audible wheezing or coughing Heart: Regular rate and rhythm.  Abdomen: Soft, nontender, nondistended.  Musculoskeletal: No edema, cyanosis, or clubbing. Neuro: Alert, answering all questions appropriately. Cranial nerves grossly intact. Skin: No rashes or petechiae noted. Psych: Normal affect.    LAB RESULTS: Lab Results  Component Value Date   NA 136 03/23/2023   K 4.8 03/23/2023   CL 104 03/23/2023   CO2 25 03/23/2023   GLUCOSE 88 03/23/2023   BUN 11 03/23/2023   CREATININE 0.85 03/23/2023   CALCIUM 9.4 03/23/2023   PROT 7.0 03/23/2023   ALBUMIN 4.0 10/17/2022   AST 14 03/23/2023   ALT 14 03/23/2023   ALKPHOS 68 10/17/2022   BILITOT 0.2 03/23/2023   GFRNONAA >60 10/16/2022   GFRAA >60 03/22/2017   Lab Results  Component Value Date   WBC 7.2 11/23/2023   NEUTROABS 5.4 11/23/2023   HGB 12.9 11/23/2023   HCT 38.0 11/23/2023   MCV 90.5 11/23/2023   PLT 285 11/23/2023   Lab Results  Component Value Date   TIBC 400 11/23/2023   TIBC 417 03/23/2023   TIBC 398 01/20/2022   FERRITIN 72 11/23/2023   FERRITIN 17 03/23/2023   FERRITIN 35 01/20/2022   IRONPCTSAT 16 11/23/2023   IRONPCTSAT 8 (L)  03/23/2023   IRONPCTSAT 9 (L) 01/20/2022   STUDIES: No results found.  ASSESSMENT AND PLAN:   Donna Haas is a 48 y.o. female with pmh of plantar fasciitis, GERD, endometriosis, anxiety, migraines referred to hematology for iron  deficiency anemia.  # Iron  deficiency - Chronic.  - December 2024 - Dr Clista- hemoglobin 12.1, ferritin 17 and saturation 8%. -She received 5 doses of IV Venofer  200 mg.  Tolerated well. - Today, hemoglobin has risen to 12.9.  Ferritin 72, iron  sat 16%. Increasing symptoms and ongoing heavy menses. Ok for venofer  x 1. Plan to recheck counts in 6 months.   # Etiology of Iron  Deficiency - Thought to be secondary to heavy menstrual cycles.  Cannot rule out occult GI bleed..  She has chronic diarrhea and was previously referred to GI.  Reports having previously undergone endoscopy and colonoscopy approximately 10 years ago.  Reports could not be located.  She was referred to GI at [facility] by her PCP.  # Menorrhagia - reports history of endometriosis, PCOS with ongoing heavy periods. Previously patient of [facility].  - ref to gynecology for evaluation and management as well as well woman care.   Disposition:  Ref gynecology Venofer  x 1- next available 6 mo- lab (cbc, ferritin, iron  studies) Day to week later see me (in person or virtual), +/- venofer - la  Orders Placed This Encounter  Procedures   Ambulatory referral to Gynecology   Patient expressed understanding and was in agreement with this plan. She also understands that She can call clinic at any time with any questions, concerns, or complaints.   I spent a total of 25 minutes reviewing chart data, face-to-face evaluation with the patient, counseling and coordination of care as detailed above.  Tinnie KANDICE Dawn, NP    11/23/2023

## 2023-11-24 ENCOUNTER — Telehealth: Payer: Self-pay | Admitting: Nurse Practitioner

## 2023-11-24 NOTE — Telephone Encounter (Signed)
 Pt was scheduled for iron  infusion on 11/25/2023. Pt called to r/s appt as her daughter has an appt that day.  Appt r/s to 11/26/2023

## 2023-11-25 ENCOUNTER — Ambulatory Visit

## 2023-11-26 ENCOUNTER — Other Ambulatory Visit: Payer: Self-pay | Admitting: Nurse Practitioner

## 2023-11-26 ENCOUNTER — Inpatient Hospital Stay

## 2023-11-26 VITALS — BP 134/84 | HR 62 | Temp 98.1°F | Resp 16

## 2023-11-26 DIAGNOSIS — D509 Iron deficiency anemia, unspecified: Secondary | ICD-10-CM | POA: Diagnosis not present

## 2023-11-26 DIAGNOSIS — E611 Iron deficiency: Secondary | ICD-10-CM

## 2023-11-26 MED ORDER — IRON SUCROSE 20 MG/ML IV SOLN
200.0000 mg | Freq: Once | INTRAVENOUS | Status: AC
  Administered 2023-11-26: 200 mg via INTRAVENOUS
  Filled 2023-11-26: qty 10

## 2023-11-26 NOTE — Patient Instructions (Signed)

## 2023-12-08 ENCOUNTER — Encounter: Payer: Self-pay | Admitting: Nurse Practitioner

## 2023-12-21 ENCOUNTER — Other Ambulatory Visit (HOSPITAL_COMMUNITY): Payer: Self-pay

## 2024-01-18 ENCOUNTER — Ambulatory Visit: Admitting: Family Medicine

## 2024-01-21 ENCOUNTER — Encounter: Payer: Self-pay | Admitting: Nurse Practitioner

## 2024-01-24 ENCOUNTER — Ambulatory Visit: Admitting: Family Medicine

## 2024-01-28 ENCOUNTER — Ambulatory Visit: Admitting: Family Medicine

## 2024-01-31 ENCOUNTER — Ambulatory Visit: Admitting: Family Medicine

## 2024-01-31 ENCOUNTER — Telehealth: Payer: Self-pay

## 2024-01-31 NOTE — Telephone Encounter (Signed)
 Copied from CRM #[id number]. Topic: General - Other >> Jan 31, 2024  2:42 PM Yolanda T wrote: Reason for CRM: patient stated Sherrilyn called to see if she had scheduled an eye exam. Patient stated she has not but will call to schedule.

## 2024-02-14 ENCOUNTER — Other Ambulatory Visit: Payer: Self-pay | Admitting: Family Medicine

## 2024-02-14 DIAGNOSIS — E1169 Type 2 diabetes mellitus with other specified complication: Secondary | ICD-10-CM

## 2024-02-14 NOTE — Telephone Encounter (Unsigned)
 Copied from CRM [id number]. Topic: Clinical - Medication Refill >> Feb 14, 2024  1:45 PM Deaijah H wrote: Medication: Semaglutide , 2 MG/DOSE, (OZEMPIC , 2 MG/DOSE,) 8 MG/3ML SOPN  Has the patient contacted their pharmacy? Yes (Agent: If no, request that the patient contact the pharmacy for the refill. If patient does not wish to contact the pharmacy document the reason why and proceed with request.) (Agent: If yes, when and what did the pharmacy advise?) Trouble getting through to them   This is the patient's preferred pharmacy:  [facility] [address], [place] - [address] [place] [address] Phone: [phone] Fax: [phone]  Is this the correct pharmacy for this prescription? Yes If no, delete pharmacy and type the correct one.   Has the prescription been filled recently? No  Is the patient out of the medication? Yes  Has the patient been seen for an appointment in the last year OR does the patient have an upcoming appointment? Yes  Can we respond through MyChart? Yes  Agent: Please be advised that Rx refills may take up to 3 business days. We ask that you follow-up with your pharmacy.

## 2024-02-15 NOTE — Telephone Encounter (Signed)
 Requested medications are due for refill today.  yes  Requested medications are on the active medications list.  yes  Last refill. 11/09/2023 9mL 0 rf  Future visit scheduled.   yes  Notes to clinic.  Labs are expired.    Requested Prescriptions  Pending Prescriptions Disp Refills   Semaglutide , 2 MG/DOSE, (OZEMPIC , 2 MG/DOSE,) 8 MG/3ML SOPN 9 mL 0    Sig: Inject 1 each into the skin once a week.     Endocrinology:  Diabetes - GLP-1 Receptor Agonists - semaglutide  Failed - 02/15/2024  5:12 PM      Failed - HBA1C in normal range and within 180 days    Hemoglobin A1C  Date Value Ref Range Status  07/19/2023 5.6 4.0 - 5.6 % Final   Hgb A1c MFr Bld  Date Value Ref Range Status  03/23/2023 5.8 (H) <5.7 % of total Hgb Final    Comment:    For someone without known diabetes, a hemoglobin  A1c value between 5.7% and 6.4% is consistent with prediabetes and should be confirmed with a  follow-up test. . For someone with known diabetes, a value <7% indicates that their diabetes is well controlled. A1c targets should be individualized based on duration of diabetes, age, comorbid conditions, and other considerations. . This assay result is consistent with an increased risk of diabetes. . Currently, no consensus exists regarding use of hemoglobin A1c for diagnosis of diabetes for children. .          Failed - Valid encounter within last 6 months    Recent Outpatient Visits           7 months ago Dyslipidemia associated with type 2 diabetes mellitus [facility])   [facility] [place], Krichna, MD              Passed - Cr in normal range and within 360 days    Creat  Date Value Ref Range Status  03/23/2023 0.85 0.50 - 0.99 mg/dL Final   Creatinine, Urine  Date Value Ref Range Status  03/23/2023 49 20 - 275 mg/dL Final

## 2024-02-16 NOTE — Telephone Encounter (Signed)
 Lvm for pt to call back and schedule an appt.

## 2024-02-16 NOTE — Telephone Encounter (Signed)
 Pt overdue for a follow up, but scheduled early December.

## 2024-02-18 ENCOUNTER — Ambulatory Visit: Admitting: Family Medicine

## 2024-02-18 ENCOUNTER — Encounter: Payer: Self-pay | Admitting: Family Medicine

## 2024-02-18 VITALS — BP 116/68 | HR 94 | Resp 16 | Ht 60.0 in | Wt 158.2 lb

## 2024-02-18 DIAGNOSIS — G720 Drug-induced myopathy: Secondary | ICD-10-CM

## 2024-02-18 DIAGNOSIS — M329 Systemic lupus erythematosus, unspecified: Secondary | ICD-10-CM

## 2024-02-18 DIAGNOSIS — E538 Deficiency of other specified B group vitamins: Secondary | ICD-10-CM | POA: Diagnosis not present

## 2024-02-18 DIAGNOSIS — Z862 Personal history of diseases of the blood and blood-forming organs and certain disorders involving the immune mechanism: Secondary | ICD-10-CM

## 2024-02-18 DIAGNOSIS — K2270 Barrett's esophagus without dysplasia: Secondary | ICD-10-CM | POA: Diagnosis not present

## 2024-02-18 DIAGNOSIS — G43109 Migraine with aura, not intractable, without status migrainosus: Secondary | ICD-10-CM

## 2024-02-18 DIAGNOSIS — Z7985 Long-term (current) use of injectable non-insulin antidiabetic drugs: Secondary | ICD-10-CM | POA: Diagnosis not present

## 2024-02-18 DIAGNOSIS — Z23 Encounter for immunization: Secondary | ICD-10-CM

## 2024-02-18 DIAGNOSIS — E1169 Type 2 diabetes mellitus with other specified complication: Secondary | ICD-10-CM

## 2024-02-18 DIAGNOSIS — F422 Mixed obsessional thoughts and acts: Secondary | ICD-10-CM | POA: Insufficient documentation

## 2024-02-18 DIAGNOSIS — Z1159 Encounter for screening for other viral diseases: Secondary | ICD-10-CM

## 2024-02-18 DIAGNOSIS — E785 Hyperlipidemia, unspecified: Secondary | ICD-10-CM | POA: Diagnosis not present

## 2024-02-18 DIAGNOSIS — F411 Generalized anxiety disorder: Secondary | ICD-10-CM

## 2024-02-18 DIAGNOSIS — Z1211 Encounter for screening for malignant neoplasm of colon: Secondary | ICD-10-CM

## 2024-02-18 LAB — POCT GLYCOSYLATED HEMOGLOBIN (HGB A1C): Hemoglobin A1C: 5.4 % (ref 4.0–5.6)

## 2024-02-18 MED ORDER — EZETIMIBE 10 MG PO TABS: 10.0000 mg | ORAL_TABLET | Freq: Every day | ORAL | 3 refills | Status: AC

## 2024-02-18 MED ORDER — OZEMPIC (2 MG/DOSE) 8 MG/3ML ~~LOC~~ SOPN: 1.0000 | PEN_INJECTOR | SUBCUTANEOUS | 0 refills | Status: DC

## 2024-02-18 MED ORDER — METFORMIN HCL ER 500 MG PO TB24: 500.0000 mg | ORAL_TABLET | Freq: Every day | ORAL | 1 refills | Status: AC

## 2024-02-18 NOTE — Progress Notes (Signed)
 Name: Donna Haas   MRN: [id number]    DOB: Mar 20, 1976   Date:02/18/2024       Progress Note  Subjective  Chief Complaint  Chief Complaint  Patient presents with   Medical Management of Chronic Issues    DM exam- changing location Mammogram- scheduled [facility]   Eye Problem    Itchy eyes   Discussed the use of AI scribe software for clinical note transcription with the patient, who gave verbal consent to proceed.  History of Present Illness Donna Haas is a 48 year old female with type 2 diabetes and dyslipidemia who presents for routine follow-up and lab work.  She manages her type 2 diabetes with Ozempic  and metformin , and her most recent A1c is 5.4. She has lost 82 pounds since starting Ozempic , with her BMI now at 30.9. She was previously on insulin  but switched to her current regimen. No hypoglycemic episodes are reported, and she follows a low-carb diet.  She has a history of dyslipidemia and was previously on statins but could not tolerate them. She was given Zetia , which she did not take, and is currently not on any medication for dyslipidemia.  Her iron  deficiency anemia is monitored by her hematologist. Her last iron  storage level was 72 in August, and her hemoglobin was stable. She received an iron  infusion previously and takes B12 over the counter. She will have her B12 and folate levels checked.  She has systemic lupus erythematosus without known organ involvement and has not seen a specialist for lupus in two years. She is in the process of scheduling an appointment.  She experiences hormone-related migraines associated with her menstrual cycle and takes Maxalt  as needed. She has a history of endometriosis and reports heavy menstrual cycles with three super heavy days requiring frequent tampon changes.  She has a history of Barrett's esophagus and GERD, for which she takes over-the-counter Nexium. She has not seen a GI specialist since 2009. She reports reflux but no  gastric pain.  She has mild intermittent asthma, which has improved with weight loss, and uses albuterol  as needed.  She is treated for generalized anxiety disorder, OCD, and PTSD by her psychiatrist. She takes alprazolam, losartan, and Dextrostat  for these conditions. Her OCD is trauma-based and complex.  She is a single mother with two daughters, one aged 38 and the other 48. The 46 year old recently started college and does not drive, requiring her to assist with transportation.    Patient Active Problem List   Diagnosis Date Noted   Iron  deficiency 04/08/2023   Type 2 diabetes mellitus with hyperglycemia, with long-term current use of insulin  (HCC) 03/23/2023   Irritable bowel syndrome with diarrhea 05/20/2022   Statin myopathy 05/20/2022   Seasonal allergic rhinitis 01/20/2022   Migraine without aura and responsive to treatment 11/20/2020   Cervical nerve root disorder 11/20/2020   B12 deficiency 11/20/2020   Dyslipidemia associated with type 2 diabetes mellitus (HCC) 06/14/2020   Anxiety, generalized 12/13/2019   Binge eating disorder 08/23/2019   Mild intermittent asthma without complication 08/23/2019   Morbid obesity (HCC) 01/25/2016   Barrett's esophagus determined by biopsy 01/21/2016   Chronic fatigue 01/21/2016   Carpal tunnel syndrome, bilateral 01/21/2016   Hx gestational diabetes 01/21/2016   Neck pain 09/08/2012   INSOMNIA 09/21/2008   NEPHROLITHIASIS, HX OF 04/17/2008   THYROID  NODULE 01/12/2008   GERD 05/30/2007   ENDOMETRIOSIS OF OTHER SPECIFIED SITES 05/30/2007   Systemic lupus erythematosus (HCC) 05/30/2007   HERNIATED LUMBAR  DISC 05/30/2007   Backache 05/30/2007    Past Surgical History:  Procedure Laterality Date   APPENDECTOMY     CESAREAN SECTION     x2   CESAREAN SECTION WITH BILATERAL TUBAL LIGATION Bilateral 08/30/2015   Procedure: CESAREAN SECTION WITH BILATERAL TUBAL LIGATION;  Surgeon: Mitzie BROCKS Ward, MD;  Location: [facility] ORS;  Service:  Obstetrics;  Laterality: Bilateral;   CHOLECYSTECTOMY     LAPAROSCOPY     x3   THYROID  CYST EXCISION     Dr. Gearldine, [place]    Family History  Problem Relation Age of Onset   Depression Mother        Bi-polar   Asthma Mother    Allergies Mother    Sleep apnea Mother    Allergies Sister    Diabetes Sister    Rheum arthritis Maternal Grandmother    Hypertension Paternal Grandmother    Macular degeneration Paternal Grandmother    Hyperlipidemia Other    Hypertension Other    Heart disease Other     Social History   Tobacco Use   Smoking status: Never   Smokeless tobacco: Never  Substance Use Topics   Alcohol use: No    Alcohol/week: 1.0 - 2.0 standard drink of alcohol    Types: 1 - 2 Standard drinks or equivalent per week    Comment: discontinued use during pregnancy     Current Outpatient Medications:    ACCU-CHEK GUIDE test strip, , Disp: , Rfl:    Accu-Chek Softclix Lancets lancets, , Disp: , Rfl:    albuterol  (VENTOLIN  HFA) 108 (90 Base) MCG/ACT inhaler, Inhale 2 puffs into the lungs every 6 (six) hours as needed for wheezing or shortness of breath., Disp: 8 g, Rfl: 1   ALPRAZolam (XANAX) 1 MG tablet, Take 1 mg by mouth daily as needed., Disp: , Rfl:    blood glucose meter kit and supplies, Dispense based on patient and insurance preference. Use up to four times daily as directed. (FOR ICD-10 E10.9, E11.9)., Disp: 1 each, Rfl: 0   Cyanocobalamin  (B-12) 1000 MCG SUBL, Place under the tongue., Disp: , Rfl:    dextroamphetamine  (DEXTROSTAT ) 10 MG tablet, Take 20 mg by mouth 2 (two) times daily., Disp: , Rfl:    escitalopram  (LEXAPRO ) 20 MG tablet, Take 1.5 tablets by mouth daily., Disp: , Rfl:    esomeprazole (NEXIUM) 20 MG capsule, Take 20 mg by mouth daily. , Disp: , Rfl:    ferrous sulfate  325 (65 FE) MG tablet, Take 325 mg by mouth daily., Disp: , Rfl:    loratadine  (CLARITIN ) 10 MG tablet, Take 10 mg by mouth daily., Disp: , Rfl:    Magnesium 250 MG TABS, Take  1 tablet by mouth daily., Disp: , Rfl:    metFORMIN  (GLUCOPHAGE ) 500 MG tablet, Take 1 tablet (500 mg total) by mouth 2 (two) times daily with a meal., Disp: 180 tablet, Rfl: 1   rizatriptan  (MAXALT ) 10 MG tablet, Take 1 tablet (10 mg total) by mouth as needed for migraine. May repeat in 2 hours if needed, Disp: 10 tablet, Rfl: 1   Semaglutide , 2 MG/DOSE, (OZEMPIC , 2 MG/DOSE,) 8 MG/3ML SOPN, INJECT 1 EACH INTO THE SKIN ONCE A WEEK., Disp: 9 mL, Rfl: 0   tiZANidine (ZANAFLEX) 4 MG tablet, Take 4-12 mg by mouth at bedtime., Disp: , Rfl:    Bempedoic Acid-Ezetimibe  (NEXLIZET ) 180-10 MG TABS, Take 1 tablet by mouth daily. (Patient not taking: Reported on 02/18/2024), Disp: 90 tablet, Rfl: 1  Olopatadine-Mometasone (RYALTRIS ) 665-25 MCG/ACT SUSP, Place 2 puffs into the nose in the morning and at bedtime. (Patient not taking: Reported on 02/18/2024), Disp: 29 g, Rfl: 0  Allergies  Allergen Reactions   Compazine [Prochlorperazine] Anaphylaxis   Prednisone Anaphylaxis   Prochlorperazine Edisylate Anaphylaxis   Etodolac     Other reaction(s): Unknown   Farxiga [Dapagliflozin]     Constipation and headaches   Hydrocodone -Acetaminophen  Nausea Only   Morphine  And Codeine Nausea Only and Other (See Comments)    Mood swings, headache    I personally reviewed active problem list, medication list, allergies with the patient/caregiver today.   ROS  Ten systems reviewed and is negative except as mentioned in HPI    Objective Physical Exam  CONSTITUTIONAL: Patient appears well-developed and well-nourished.  No distress. HEENT: Head atraumatic, normocephalic, neck supple. CARDIOVASCULAR: Normal rate, regular rhythm and normal heart sounds.  No murmur heard. No BLE edema. PULMONARY: Effort normal and breath sounds normal. No respiratory distress. ABDOMINAL: There is no tenderness or distention. MUSCULOSKELETAL: Normal gait. Without gross motor or sensory deficit. PSYCHIATRIC: Patient has a normal  mood and affect. behavior is normal. Judgment and thought content normal.  Vitals:   02/18/24 0904  BP: 116/68  Pulse: 94  Resp: 16  SpO2: 99%  Weight: 158 lb 3.2 oz (71.8 kg)  Height: 5' (1.524 m)    Body mass index is 30.9 kg/m.  Recent Results (from the past 2160 hours)  Iron  and TIBC([facility])     Status: None   Collection Time: 11/23/23  1:55 PM  Result Value Ref Range   Iron  63 28 - 170 ug/dL   TIBC 599 749 - 549 ug/dL   Saturation Ratios 16 10.4 - 31.8 %   UIBC 337 ug/dL    Comment: Performed at [facility], [address] [place], [place] [address]  Ferritin     Status: None   Collection Time: 11/23/23  1:55 PM  Result Value Ref Range   Ferritin 72 11 - 307 ng/mL    Comment: Performed at [facility], [address] [place], [place] [address]  CBC with Differential/Platelet     Status: None   Collection Time: 11/23/23  1:55 PM  Result Value Ref Range   WBC 7.2 4.0 - 10.5 K/uL   RBC 4.20 3.87 - 5.11 MIL/uL   Hemoglobin 12.9 12.0 - 15.0 g/dL   HCT 61.9 63.9 - 53.9 %   MCV 90.5 80.0 - 100.0 fL   MCH 30.7 26.0 - 34.0 pg   MCHC 33.9 30.0 - 36.0 g/dL   RDW 88.3 88.4 - 84.4 %   Platelets 285 150 - 400 K/uL   nRBC 0.0 0.0 - 0.2 %   Neutrophils Relative % 75 %   Neutro Abs 5.4 1.7 - 7.7 K/uL   Lymphocytes Relative 18 %   Lymphs Abs 1.3 0.7 - 4.0 K/uL   Monocytes Relative 6 %   Monocytes Absolute 0.4 0.1 - 1.0 K/uL   Eosinophils Relative 1 %   Eosinophils Absolute 0.1 0.0 - 0.5 K/uL   Basophils Relative 0 %   Basophils Absolute 0.0 0.0 - 0.1 K/uL   Immature Granulocytes 0 %   Abs Immature Granulocytes 0.01 0.00 - 0.07 K/uL    Comment: Performed at [facility], [address] [place], [place] [address]  POCT glycosylated hemoglobin (Hb A1C)     Status: None   Collection Time: 02/18/24  9:09 AM  Result Value Ref Range  Hemoglobin A1C 5.4 4.0 - 5.6 %   HbA1c POC (<> result, manual entry)     HbA1c, POC (prediabetic range)      HbA1c, POC (controlled diabetic range)      Diabetic Foot Exam:     PHQ2/9:    02/18/2024    8:53 AM 11/23/2023    2:16 PM 07/19/2023   12:59 PM 04/08/2023    2:07 PM 03/23/2023    2:00 PM  Depression screen PHQ 2/9  Decreased Interest 1 1 0 1 0  Down, Depressed, Hopeless 1 1 0 1 0  PHQ - 2 Score 2 2 0 2 0  Altered sleeping 1 3 0  0  Tired, decreased energy 1 3 0  3  Change in appetite 1 3 0  0  Feeling bad or failure about yourself  0 0 0  0  Trouble concentrating 1 3 0  0  Moving slowly or fidgety/restless 0 0 0  0  Suicidal thoughts 0 0 0  0  PHQ-9 Score 6 14  0   3   Difficult doing work/chores Somewhat difficult  Not difficult at all       Data saved with a previous flowsheet row definition    phq 9 is positive  Fall Risk:    02/18/2024    8:52 AM 03/23/2023    2:00 PM 05/20/2022    1:47 PM 01/20/2022    9:21 AM 10/22/2021    1:03 PM  Fall Risk   Falls in the past year? 0 0 0 1 1  Number falls in past yr: 0 0  0 0  Injury with Fall? 0 0  0 0  Risk for fall due to : No Fall Risks No Fall Risks No Fall Risks Impaired balance/gait Impaired balance/gait  Follow up Falls evaluation completed Falls prevention discussed Falls prevention discussed Falls prevention discussed;Education provided  Falls prevention discussed;Education provided      Data saved with a previous flowsheet row definition      Assessment & Plan Health Maintenance  Routine wellness visit with multiple ongoing health issues. Due for Pap smear and mammogram. Colonoscopy overdue due to age and anemia. Hepatitis B screening discussed. - she states has a visit scheduled with gyn soon  - mammograms done at [facility] and we will obtain results  - Referred for colonoscopy. - Offered hepatitis B screening.  Type 2 diabetes mellitus with dyslipidemia Type 2 diabetes well-controlled with A1c at 5.4. Significant weight loss achieved with Ozempic  and lifestyle changes. Dyslipidemia managed with ezetimibe  due  to statin intolerance. Metformin  dosage reduced due to improved glycemic control. - Continue Ozempic  2 mg. - Prescribed ezetimibe  (Zetia ). - Reduced metformin  to 500 mg ER once daily.  Systemic lupus erythematosus without organ involvement Systemic lupus erythematosus without organ involvement. No recent specialist follow-up. -  She states she has an upcoming  rheumatologist visit scheduled with Dr. Tobie for lupus management.  Barrett's esophagus and gastroesophageal reflux disease Barrett's esophagus and GERD managed with over-the-counter Nexium. No recent EGD since 2009. - Referred to gastroenterologist for EGD and GERD management.  Iron  deficiency anemia, history of Iron  deficiency anemia likely due to heavy menstrual cycles and endometriosis. Hemoglobin levels stable. Iron  storage levels adequate. - Referred for colonoscopy to evaluate for gastrointestinal bleeding.  Migraine, hormonally triggered Hormonally triggered migraines managed with Maxalt  as needed. - Continue Maxalt  as needed for migraines.  Generalized anxiety disorder, obsessive-compulsive disorder, and depression Managed by psychiatrist. Current medications include alprazolam,  losartan, and Dextrostat . - Continue current psychiatric medications.  Asthma, mild intermittent Mild intermittent asthma managed with albuterol  as needed. Symptoms improving with weight loss. - Continue albuterol  as needed.  Deficiency of vitamin B12, history of Vitamin B12 deficiency managed with over-the-counter supplements. - Checked B12 and folate levels.

## 2024-03-21 ENCOUNTER — Ambulatory Visit: Admitting: Family Medicine

## 2024-04-13 ENCOUNTER — Encounter: Payer: Self-pay | Admitting: Nurse Practitioner

## 2024-04-25 ENCOUNTER — Encounter: Payer: Self-pay | Admitting: Nurse Practitioner

## 2024-05-01 ENCOUNTER — Encounter: Payer: Self-pay | Admitting: Nurse Practitioner

## 2024-05-02 ENCOUNTER — Ambulatory Visit: Admitting: Family Medicine

## 2024-05-07 ENCOUNTER — Other Ambulatory Visit: Payer: Self-pay | Admitting: Family Medicine

## 2024-05-07 DIAGNOSIS — E1169 Type 2 diabetes mellitus with other specified complication: Secondary | ICD-10-CM

## 2024-05-08 NOTE — Telephone Encounter (Signed)
 Requested Prescriptions  Pending Prescriptions Disp Refills   OZEMPIC , 2 MG/DOSE, 8 MG/3ML SOPN [Pharmacy Med Name: OZEMPIC  8 MG/3 ML (2 MG/DOSE)] 9 mL 0    Sig: INJECT 1 PEN (2MG ) INTO THE SKIN ONCE A WEEK.     Endocrinology:  Diabetes - GLP-1 Receptor Agonists - semaglutide  Failed - 05/08/2024  3:45 PM      Failed - Cr in normal range and within 360 days    Creat  Date Value Ref Range Status  03/23/2023 0.85 0.50 - 0.99 mg/dL Final   Creatinine, Urine  Date Value Ref Range Status  03/23/2023 49 20 - 275 mg/dL Final         Passed - HBA1C in normal range and within 180 days    Hemoglobin A1C  Date Value Ref Range Status  02/18/2024 5.4 4.0 - 5.6 % Final   Hgb A1c MFr Bld  Date Value Ref Range Status  03/23/2023 5.8 (H) <5.7 % of total Hgb Final    Comment:    For someone without known diabetes, a hemoglobin  A1c value between 5.7% and 6.4% is consistent with prediabetes and should be confirmed with a  follow-up test. . For someone with known diabetes, a value <7% indicates that their diabetes is well controlled. A1c targets should be individualized based on duration of diabetes, age, comorbid conditions, and other considerations. . This assay result is consistent with an increased risk of diabetes. . Currently, no consensus exists regarding use of hemoglobin A1c for diagnosis of diabetes for children. SABRA Amy - Valid encounter within last 6 months    Recent Outpatient Visits           2 months ago Dyslipidemia associated with type 2 diabetes mellitus [facility])   [place] [facility] Glenard Mire, MD   9 months ago Dyslipidemia associated with type 2 diabetes mellitus [facility])   [facility] [place], Krichna, MD

## 2024-05-25 ENCOUNTER — Other Ambulatory Visit

## 2024-05-30 ENCOUNTER — Ambulatory Visit: Admitting: Nurse Practitioner

## 2024-05-30 ENCOUNTER — Ambulatory Visit

## 2024-06-14 ENCOUNTER — Ambulatory Visit: Admitting: Family Medicine

## 2024-06-19 ENCOUNTER — Ambulatory Visit: Admitting: Family Medicine
